# Patient Record
Sex: Female | Born: 1940 | Race: White | Hispanic: No | Marital: Married | Smoking: Never smoker
Health system: Southern US, Community
[De-identification: ages and names within clinical notes are randomized; demographics above are authoritative.]

## PROBLEM LIST (undated history)

## (undated) ENCOUNTER — Encounter

## (undated) ENCOUNTER — Telehealth: Attending: Internal Medicine | Primary: Internal Medicine

## (undated) ENCOUNTER — Ambulatory Visit

## (undated) ENCOUNTER — Telehealth

## (undated) ENCOUNTER — Encounter: Attending: Hematology & Oncology | Primary: Hematology & Oncology

## (undated) ENCOUNTER — Telehealth: Attending: Hematology & Oncology | Primary: Hematology & Oncology

## (undated) ENCOUNTER — Encounter: Payer: MEDICARE | Attending: Adult Health | Primary: Adult Health

## (undated) ENCOUNTER — Ambulatory Visit: Attending: Adult Health | Primary: Adult Health

## (undated) ENCOUNTER — Telehealth: Attending: Medical Oncology | Primary: Medical Oncology

## (undated) ENCOUNTER — Ambulatory Visit: Attending: Family | Primary: Family

## (undated) ENCOUNTER — Encounter: Attending: Pharmacist | Primary: Pharmacist

## (undated) ENCOUNTER — Inpatient Hospital Stay

## (undated) ENCOUNTER — Ambulatory Visit: Payer: MEDICARE | Attending: Hematology & Oncology | Primary: Hematology & Oncology

## (undated) ENCOUNTER — Encounter: Payer: MEDICARE | Attending: Hematology & Oncology | Primary: Hematology & Oncology

## (undated) ENCOUNTER — Telehealth: Attending: Pharmacist | Primary: Pharmacist

## (undated) ENCOUNTER — Ambulatory Visit: Payer: MEDICARE

## (undated) ENCOUNTER — Ambulatory Visit: Attending: Hematology & Oncology | Primary: Hematology & Oncology

## (undated) DIAGNOSIS — E785 Hyperlipidemia, unspecified: Secondary | ICD-10-CM

## (undated) DIAGNOSIS — J45909 Unspecified asthma, uncomplicated: Secondary | ICD-10-CM

## (undated) DIAGNOSIS — N189 Chronic kidney disease, unspecified: Secondary | ICD-10-CM

## (undated) DIAGNOSIS — K219 Gastro-esophageal reflux disease without esophagitis: Secondary | ICD-10-CM

## (undated) DIAGNOSIS — C801 Malignant (primary) neoplasm, unspecified: Secondary | ICD-10-CM

## (undated) DIAGNOSIS — H04121 Dry eye syndrome of right lacrimal gland: Secondary | ICD-10-CM

## (undated) DIAGNOSIS — Z5189 Encounter for other specified aftercare: Secondary | ICD-10-CM

## (undated) DIAGNOSIS — K649 Unspecified hemorrhoids: Secondary | ICD-10-CM

## (undated) DIAGNOSIS — M1990 Unspecified osteoarthritis, unspecified site: Secondary | ICD-10-CM

## (undated) DIAGNOSIS — I10 Essential (primary) hypertension: Secondary | ICD-10-CM

## (undated) DIAGNOSIS — H269 Unspecified cataract: Secondary | ICD-10-CM

## (undated) DIAGNOSIS — H409 Unspecified glaucoma: Secondary | ICD-10-CM

## (undated) HISTORY — DX: Dry eye syndrome of right lacrimal gland: H04.121

## (undated) HISTORY — DX: Unspecified asthma, uncomplicated: J45.909

## (undated) HISTORY — DX: Unspecified cataract: H26.9

## (undated) HISTORY — DX: Unspecified glaucoma: H40.9

## (undated) HISTORY — PX: TUBAL LIGATION: SHX77

## (undated) HISTORY — PX: TONSILLECTOMY: SUR1361

## (undated) HISTORY — DX: Malignant (primary) neoplasm, unspecified: C80.1

## (undated) HISTORY — DX: Unspecified osteoarthritis, unspecified site: M19.90

## (undated) HISTORY — DX: Chronic kidney disease, unspecified: N18.9

## (undated) HISTORY — PX: CATARACT EXTRACTION, BILATERAL: SHX1313

## (undated) HISTORY — DX: Essential (primary) hypertension: I10

## (undated) HISTORY — DX: Unspecified hemorrhoids: K64.9

## (undated) HISTORY — DX: Hyperlipidemia, unspecified: E78.5

## (undated) HISTORY — PX: CHOLECYSTECTOMY: SHX55

---

## 1989-01-06 HISTORY — PX: TOTAL ABDOMINAL HYSTERECTOMY: SHX209

## 2002-10-16 LAB — HM COLONOSCOPY: HM Colonoscopy: NORMAL

## 2004-07-19 ENCOUNTER — Emergency Department: Payer: Self-pay | Admitting: Emergency Medicine

## 2004-07-27 ENCOUNTER — Emergency Department: Payer: Self-pay | Admitting: General Practice

## 2004-07-30 ENCOUNTER — Ambulatory Visit: Payer: Self-pay | Admitting: Unknown Physician Specialty

## 2005-08-27 ENCOUNTER — Ambulatory Visit: Payer: Self-pay | Admitting: Unknown Physician Specialty

## 2007-07-02 ENCOUNTER — Ambulatory Visit: Payer: Self-pay | Admitting: Unknown Physician Specialty

## 2007-12-31 ENCOUNTER — Ambulatory Visit: Payer: Self-pay | Admitting: Family Medicine

## 2008-12-29 ENCOUNTER — Ambulatory Visit: Payer: Self-pay | Admitting: Unknown Physician Specialty

## 2009-01-12 ENCOUNTER — Emergency Department: Payer: Self-pay | Admitting: Emergency Medicine

## 2010-01-03 ENCOUNTER — Ambulatory Visit: Payer: Self-pay | Admitting: Unknown Physician Specialty

## 2010-01-30 ENCOUNTER — Ambulatory Visit: Payer: Self-pay | Admitting: Internal Medicine

## 2010-04-09 ENCOUNTER — Emergency Department: Payer: Self-pay | Admitting: Unknown Physician Specialty

## 2010-04-15 ENCOUNTER — Emergency Department: Payer: Self-pay | Admitting: Emergency Medicine

## 2010-07-20 LAB — HM DEXA SCAN

## 2011-01-10 ENCOUNTER — Ambulatory Visit: Payer: Self-pay | Admitting: Unknown Physician Specialty

## 2011-03-16 LAB — HM MAMMOGRAPHY: HM Mammogram: NORMAL

## 2011-05-05 ENCOUNTER — Telehealth: Payer: Self-pay | Admitting: Internal Medicine

## 2011-05-05 NOTE — Telephone Encounter (Signed)
Phone note was created in error, patient has an appt for labwork 05/06/11

## 2011-05-06 ENCOUNTER — Other Ambulatory Visit (INDEPENDENT_AMBULATORY_CARE_PROVIDER_SITE_OTHER): Payer: Medicare Other | Admitting: *Deleted

## 2011-05-06 DIAGNOSIS — Z79899 Other long term (current) drug therapy: Secondary | ICD-10-CM

## 2011-05-06 DIAGNOSIS — Z0000 Encounter for general adult medical examination without abnormal findings: Secondary | ICD-10-CM

## 2011-05-06 LAB — CBC WITH DIFFERENTIAL/PLATELET
Basophils Absolute: 0 10*3/uL (ref 0.0–0.1)
Basophils Relative: 0.5 % (ref 0.0–3.0)
Eosinophils Absolute: 0 10*3/uL (ref 0.0–0.7)
Eosinophils Relative: 1.1 % (ref 0.0–5.0)
HCT: 36.5 % (ref 36.0–46.0)
Hemoglobin: 12.2 g/dL (ref 12.0–15.0)
Lymphocytes Relative: 32.6 % (ref 12.0–46.0)
Lymphs Abs: 1 10*3/uL (ref 0.7–4.0)
MCHC: 33.5 g/dL (ref 30.0–36.0)
MCV: 95.4 fl (ref 78.0–100.0)
Monocytes Absolute: 0.3 10*3/uL (ref 0.1–1.0)
Monocytes Relative: 10.6 % (ref 3.0–12.0)
Neutro Abs: 1.7 10*3/uL (ref 1.4–7.7)
Neutrophils Relative %: 55.2 % (ref 43.0–77.0)
Platelets: 165 10*3/uL (ref 150.0–400.0)
RBC: 3.83 Mil/uL — ABNORMAL LOW (ref 3.87–5.11)
RDW: 14.7 % — ABNORMAL HIGH (ref 11.5–14.6)
WBC: 3 10*3/uL — ABNORMAL LOW (ref 4.5–10.5)

## 2011-05-06 LAB — FERRITIN: Ferritin: 39 ng/mL (ref 10.0–291.0)

## 2011-05-06 LAB — VITAMIN B12: Vitamin B-12: 725 pg/mL (ref 211–911)

## 2011-05-07 LAB — IRON AND TIBC
%SAT: 30 % (ref 20–55)
Iron: 98 ug/dL (ref 42–145)
TIBC: 330 ug/dL (ref 250–470)
UIBC: 232 ug/dL (ref 125–400)

## 2011-05-17 LAB — HM PAP SMEAR: HM Pap smear: NORMAL

## 2011-07-29 ENCOUNTER — Ambulatory Visit (INDEPENDENT_AMBULATORY_CARE_PROVIDER_SITE_OTHER): Payer: Medicare Other | Admitting: *Deleted

## 2011-07-29 DIAGNOSIS — Z23 Encounter for immunization: Secondary | ICD-10-CM

## 2011-08-01 ENCOUNTER — Encounter: Payer: Self-pay | Admitting: *Deleted

## 2011-09-30 ENCOUNTER — Other Ambulatory Visit: Payer: Self-pay | Admitting: Internal Medicine

## 2011-09-30 MED ORDER — LISINOPRIL-HYDROCHLOROTHIAZIDE 20-25 MG PO TABS: 1.0000 | ORAL_TABLET | Freq: Every day | ORAL | Status: DC

## 2011-10-17 ENCOUNTER — Encounter: Payer: Self-pay | Admitting: Internal Medicine

## 2011-10-17 ENCOUNTER — Ambulatory Visit (INDEPENDENT_AMBULATORY_CARE_PROVIDER_SITE_OTHER): Payer: Medicare Other | Admitting: Internal Medicine

## 2011-10-17 ENCOUNTER — Telehealth: Payer: Self-pay | Admitting: Internal Medicine

## 2011-10-17 DIAGNOSIS — E119 Type 2 diabetes mellitus without complications: Secondary | ICD-10-CM

## 2011-10-17 DIAGNOSIS — R5381 Other malaise: Secondary | ICD-10-CM

## 2011-10-17 DIAGNOSIS — Z79899 Other long term (current) drug therapy: Secondary | ICD-10-CM

## 2011-10-17 DIAGNOSIS — Z0000 Encounter for general adult medical examination without abnormal findings: Secondary | ICD-10-CM

## 2011-10-17 DIAGNOSIS — I10 Essential (primary) hypertension: Secondary | ICD-10-CM | POA: Insufficient documentation

## 2011-10-17 DIAGNOSIS — E785 Hyperlipidemia, unspecified: Secondary | ICD-10-CM

## 2011-10-17 DIAGNOSIS — R5383 Other fatigue: Secondary | ICD-10-CM

## 2011-10-17 LAB — LIPID PANEL
Cholesterol: 142 mg/dL (ref 0–200)
HDL: 51.5 mg/dL (ref >39.00)
LDL Cholesterol: 75 mg/dL (ref 0–99)
Total CHOL/HDL Ratio: 3
Triglycerides: 79 mg/dL (ref 0.0–149.0)
VLDL: 15.8 mg/dL (ref 0.0–40.0)

## 2011-10-17 LAB — CBC WITH DIFFERENTIAL/PLATELET
Basophils Absolute: 0 10*3/uL (ref 0.0–0.1)
Basophils Relative: 0.2 % (ref 0.0–3.0)
Eosinophils Absolute: 0 10*3/uL (ref 0.0–0.7)
Eosinophils Relative: 0.3 % (ref 0.0–5.0)
HCT: 35.6 % — ABNORMAL LOW (ref 36.0–46.0)
Hemoglobin: 12.2 g/dL (ref 12.0–15.0)
Lymphocytes Relative: 24.8 % (ref 12.0–46.0)
Lymphs Abs: 1.3 10*3/uL (ref 0.7–4.0)
MCHC: 34.2 g/dL (ref 30.0–36.0)
MCV: 94.8 fl (ref 78.0–100.0)
Monocytes Absolute: 0.6 10*3/uL (ref 0.1–1.0)
Monocytes Relative: 10.8 % (ref 3.0–12.0)
Neutro Abs: 3.4 10*3/uL (ref 1.4–7.7)
Neutrophils Relative %: 63.9 % (ref 43.0–77.0)
Platelets: 193 10*3/uL (ref 150.0–400.0)
RBC: 3.76 Mil/uL — ABNORMAL LOW (ref 3.87–5.11)
RDW: 14.4 % (ref 11.5–14.6)
WBC: 5.4 10*3/uL (ref 4.5–10.5)

## 2011-10-17 LAB — COMPREHENSIVE METABOLIC PANEL
ALT: 27 U/L (ref 0–35)
AST: 34 U/L (ref 0–37)
Albumin: 4.2 g/dL (ref 3.5–5.2)
Alkaline Phosphatase: 51 U/L (ref 39–117)
BUN: 13 mg/dL (ref 6–23)
CO2: 30 mEq/L (ref 19–32)
Calcium: 9.8 mg/dL (ref 8.4–10.5)
Chloride: 98 mEq/L (ref 96–112)
Creatinine, Ser: 0.6 mg/dL (ref 0.4–1.2)
GFR: 98.7 mL/min (ref >60.00)
Glucose, Bld: 140 mg/dL — ABNORMAL HIGH (ref 70–99)
Potassium: 3.6 mEq/L (ref 3.5–5.1)
Sodium: 135 mEq/L (ref 135–145)
Total Bilirubin: 0.7 mg/dL (ref 0.3–1.2)
Total Protein: 7.7 g/dL (ref 6.0–8.3)

## 2011-10-17 LAB — MICROALBUMIN / CREATININE URINE RATIO
Creatinine,U: 31.8 mg/dL
Microalb Creat Ratio: 0.9 mg/g (ref 0.0–30.0)
Microalb, Ur: 0.3 mg/dL (ref 0.0–1.9)

## 2011-10-17 LAB — HEMOGLOBIN A1C: Hgb A1c MFr Bld: 7 % — ABNORMAL HIGH (ref 4.6–6.5)

## 2011-10-17 LAB — TSH: TSH: 2.35 u[IU]/mL (ref 0.35–5.50)

## 2011-10-17 MED ORDER — ZOSTER VACCINE LIVE 19400 UNT/0.65ML ~~LOC~~ SOLR: 0.6500 mL | Freq: Once | SUBCUTANEOUS | Status: AC

## 2011-10-17 NOTE — Progress Notes (Signed)
Subjective:    Patient ID: Julie Holland, female    DOB: [birth year], 71 y.o.   MRN: [id number]  HPI  Past Medical History  Diagnosis Date  . Diabetes mellitus   . Hyperlipidemia   . Hypertension    Current Outpatient Prescriptions on File Prior to Visit  Medication Sig Dispense Refill  . lisinopril-hydrochlorothiazide (PRINZIDE,ZESTORETIC) 20-25 MG per tablet Take 1 tablet by mouth daily.  90 tablet  3    Review of Systems  Constitutional: Negative for fever, chills and unexpected weight change.  HENT: Negative for hearing loss, ear pain, nosebleeds, congestion, sore throat, facial swelling, rhinorrhea, sneezing, mouth sores, trouble swallowing, neck pain, neck stiffness, voice change, postnasal drip, sinus pressure, tinnitus and ear discharge.   Eyes: Negative for pain, discharge, redness and visual disturbance.  Respiratory: Negative for cough, chest tightness, shortness of breath, wheezing and stridor.   Cardiovascular: Negative for chest pain, palpitations and leg swelling.  Musculoskeletal: Negative for myalgias and arthralgias.  Skin: Negative for color change and rash.  Neurological: Negative for dizziness, weakness, light-headedness and headaches.  Hematological: Negative for adenopathy.       Objective:   Physical Exam  Constitutional: She is oriented to person, place, and time. She appears well-developed and well-nourished.  HENT:  Mouth/Throat: Oropharynx is clear and moist.  Eyes: EOM are normal. Pupils are equal, round, and reactive to light. No scleral icterus.  Neck: Normal range of motion. Neck supple. No JVD present. No thyromegaly present.  Cardiovascular: Normal rate, regular rhythm, normal heart sounds and intact distal pulses.   Pulmonary/Chest: Effort normal and breath sounds normal.  Abdominal: Soft. Bowel sounds are normal. She exhibits no mass. There is no tenderness.  Musculoskeletal: Normal range of motion. She exhibits no edema.  Lymphadenopathy:      She has no cervical adenopathy.  Neurological: She is alert and oriented to person, place, and time.  Skin: Skin is warm and dry.  Psychiatric: She has a normal mood and affect.      Assessment & Plan:   Subjective:    Julie Holland is a 71 y.o. female who presents for Medicare Annual/Subsequent preventive examination.  She has a history of diabetes mellitus, diet controlled hypertension and hyperlipidemia is here for her annual exam. She has a history of a minor fall which occurred about a month ago in her husband's work garage she could improve to left shin after slipping on a 6 slip or a step. Now resolved.  She has had no other falls in the last year and has no has had no episodes of dizziness or orthostasis.  She checks her sugars were 1-2 times daily and notes that her postprandials are always below 140 as are her fastings.  She is up-to-date  on all screenings.  Preventive Screening-Counseling & Management  Tobacco History  Smoking status  . Never Smoker   Smokeless tobacco  . Not on file     Problems Prior to Visit 1.   Current Problems (verified) Patient Active Problem List  Diagnoses  . Diabetes mellitus  . Hyperlipidemia  . Hypertension    Medications Prior to Visit Current Outpatient Prescriptions on File Prior to Visit  Medication Sig Dispense Refill  . lisinopril-hydrochlorothiazide (PRINZIDE,ZESTORETIC) 20-25 MG per tablet Take 1 tablet by mouth daily.  90 tablet  3    Current Medications (verified) Current Outpatient Prescriptions  Medication Sig Dispense Refill  . aspirin 81 MG tablet Take 160 mg by mouth daily.      Marland Kitchen  Fluticasone-Salmeterol (ADVAIR DISKUS) 100-50 MCG/DOSE AEPB Inhale 1 puff into the lungs daily.      Marland Kitchen lisinopril-hydrochlorothiazide (PRINZIDE,ZESTORETIC) 20-25 MG per tablet Take 1 tablet by mouth daily.  90 tablet  3  . Multiple Vitamin (MULTIVITAMIN) tablet Take 1 tablet by mouth daily.      . pravastatin (PRAVACHOL) 20 MG tablet  Take 20 mg by mouth daily.      Marland Kitchen zoster vaccine live, PF, (ZOSTAVAX) 57846 UNT/0.65ML injection Inject 19,400 Units into the skin once.  1 vial  0     Allergies (verified) Review of patient's allergies indicates no known allergies.   PAST HISTORY  Family History Family History  Problem Relation Age of Onset  . Diabetes Mother   . Heart disease Mother     Social History History  Substance Use Topics  . Smoking status: Never Smoker   . Smokeless tobacco: Not on file  . Alcohol Use: Not on file     Are there smokers in your home (other than you)? No  Risk Factors Current exercise habits: Home exercise routine includes walking 1 hrs per day.  Dietary issues discussed: lowglycemic index  Cardiac risk factors: advanced age (older than 40 for men, 47 for women), diabetes mellitus, dyslipidemia and hypertension.  Depression Screen (Note: if answer to either of the following is "Yes", a more complete depression screening is indicated)   Over the past two weeks, have you felt down, depressed or hopeless? No  Over the past two weeks, have you felt little interest or pleasure in doing things? No  Have you lost interest or pleasure in daily life? No  Do you often feel hopeless? No  Do you cry easily over simple problems? No  Activities of Daily Living In your present state of health, do you have any difficulty performing the following activities?:  Driving? No Managing money?  No Feeding yourself? No Getting from bed to chair? No Climbing a flight of stairs? No Preparing food and eating?: No Bathing or showering? No Getting dressed: No Getting to the toilet? No Using the toilet:No Moving around from place to place: No In the past year have you fallen or had a near fall?:No   Are you sexually active?  No  Do you have more than one partner?  No  Hearing Difficulties: No Do you often ask people to speak up or repeat themselves? No Do you experience ringing or noises in  your ears? No Do you have difficulty understanding soft or whispered voices? No   Do you feel that you have a problem with memory? No  Do you often misplace items? No  Do you feel safe at home?  Yes  Cognitive Testing  Alert? Yes  Normal Appearance?Yes  Oriented to person? Yes  Place? Yes   Time? Yes  Recall of three objects?  Yes  Can perform simple calculations? Yes  Displays appropriate judgment?Yes  Can read the correct time from a watch face?Yes   Advanced Directives have been discussed with the patient? Yes  List the Names of Other Physician/Practitioners you currently use: 1.    Indicate any recent Medical Services you may have received from other than [facility] providers in the past year (date may be approximate).  Immunization History  Administered Date(s) Administered  . Influenza Split 07/29/2011  . Pneumococcal Conjugate 10/16/2010  . Tdap 10/16/2010    Screening Tests Health Maintenance  Topic Date Due  . Zostavax  09/16/1999  . Pneumococcal Polysaccharide Vaccine Age 40  And Over  09/15/2004  . Influenza Vaccine  06/08/2012  . Colonoscopy  10/16/2012  . Tetanus/tdap  10/16/2020    All answers were reviewed with the patient and necessary referrals were made:  Duncan Dull, MD   10/19/2011   History reviewed: allergies, current medications, past family history, past medical history, past social history, past surgical history and problem list  Review of Systems A comprehensive review of systems was negative.    Objective:     Vision by Snellen chart: right ZOX:WRUEAVW declines measurement, left UJW:JXBJYNW declines measurement  There is no height on file to calculate BMI. BP 138/82  Pulse 92  Temp(Src) 97.9 F (36.6 C) (Oral)  Wt 136 lb (61.689 kg)  SpO2 98%  BP 138/82  Pulse 92  Temp(Src) 97.9 F (36.6 C) (Oral)  Wt 136 lb (61.689 kg)  SpO2 98%  General Appearance:    Alert, cooperative, no distress, appears stated age  Head:    Normocephalic, without  obvious abnormality, atraumatic  Eyes:    PERRL, conjunctiva/corneas clear, EOM's intact, fundi    benign, both eyes  Ears:    Normal TM's and external ear canals, both ears  Nose:   Nares normal, septum midline, mucosa normal, no drainage    or sinus tenderness  Throat:   Lips, mucosa, and tongue normal; teeth and gums normal  Neck:   Supple, symmetrical, trachea midline, no adenopathy;    thyroid:  no enlargement/tenderness/nodules; no carotid   bruit or JVD  Back:     Symmetric, no curvature, ROM normal, no CVA tenderness  Lungs:     Clear to auscultation bilaterally, respirations unlabored  Chest Wall:    No tenderness or deformity   Heart:    Regular rate and rhythm, S1 and S2 normal, no murmur, rub   or gallop  Breast Exam:    No tenderness, masses, or nipple abnormality  Abdomen:     Soft, non-tender, bowel sounds active all four quadrants,    no masses, no organomegaly        Extremities:   Extremities normal, atraumatic, no cyanosis or edema  Pulses:   2+ and symmetric all extremities  Skin:   Skin color, texture, turgor normal, no rashes or lesions  Lymph nodes:   Cervical, supraclavicular, and axillary nodes normal  Neurologic:   CNII-XII intact, normal strength, sensation and reflexes    throughout       Assessment:   No problem-specific assessment & plan notes found for this encounter.   Updated Medication List Outpatient Encounter Prescriptions as of 10/17/2011  Medication Sig Dispense Refill  . aspirin 81 MG tablet Take 160 mg by mouth daily.      . Fluticasone-Salmeterol (ADVAIR DISKUS) 100-50 MCG/DOSE AEPB Inhale 1 puff into the lungs daily.      Marland Kitchen lisinopril-hydrochlorothiazide (PRINZIDE,ZESTORETIC) 20-25 MG per tablet Take 1 tablet by mouth daily.  90 tablet  3  . Multiple Vitamin (MULTIVITAMIN) tablet Take 1 tablet by mouth daily.      . pravastatin (PRAVACHOL) 20 MG tablet Take 20 mg by mouth daily.      Marland Kitchen zoster vaccine live, PF, (ZOSTAVAX) 29562 UNT/0.65ML  injection Inject 19,400 Units into the skin once.  1 vial  0        Plan:    Patient Instructions (the written plan) was given to the patient.  Medicare Attestation I have personally reviewed: The patient's medical and social history Their use of alcohol, tobacco or illicit drugs Their current  medications and supplements The patient's functional ability including ADLs,fall risks, home safety risks, cognitive, and hearing and visual impairment Diet and physical activities Evidence for depression or mood disorders  The patient's weight, height, BMI, and visual acuity have been recorded in the chart.  I have made referrals, counseling, and provided education to the patient based on review of the above and I have provided the patient with a written personalized care plan for preventive services.     Duncan Dull, MD   10/19/2011

## 2011-10-17 NOTE — Telephone Encounter (Signed)
Diabetes remains  well controlled HgbA1c is < 7.0. Cholesterol is at goal too.,  And thyroid is normal

## 2011-10-17 NOTE — Patient Instructions (Signed)
We will call you with the results of your labs,  Please fill out the medicare wellness form before you le ve so we can bill this visit as a wellness visit.  Please sign a release from for the old practice's records

## 2011-10-19 ENCOUNTER — Encounter: Payer: Self-pay | Admitting: Internal Medicine

## 2011-10-19 NOTE — Assessment & Plan Note (Signed)
Well controlled on pravastatin

## 2011-10-19 NOTE — Assessment & Plan Note (Signed)
well controlled  

## 2011-10-19 NOTE — Assessment & Plan Note (Signed)
Her hgba1c is 7.0 an dshe has no proteinuria, on diet alone.  She is on an ACE inhibitor and LD is at goal.

## 2011-10-20 NOTE — Telephone Encounter (Signed)
Advised pt

## 2011-11-11 ENCOUNTER — Encounter: Payer: Self-pay | Admitting: Internal Medicine

## 2011-11-13 ENCOUNTER — Telehealth: Payer: Self-pay | Admitting: Internal Medicine

## 2011-11-13 MED ORDER — GLUCOSE BLOOD VI STRP: ORAL_STRIP | Status: AC

## 2011-11-13 NOTE — Telephone Encounter (Signed)
Rx has been faxed in.  Patient notified.

## 2011-11-13 NOTE — Telephone Encounter (Signed)
[phone] Pt called to check on her rx for test strips. her pharmacy [facility] was to send this rx to you yesterday afternoon

## 2012-02-20 ENCOUNTER — Other Ambulatory Visit: Payer: Self-pay | Admitting: Internal Medicine

## 2012-02-20 MED ORDER — PRAVASTATIN SODIUM 20 MG PO TABS: 20.0000 mg | ORAL_TABLET | Freq: Every day | ORAL | Status: DC

## 2012-04-29 ENCOUNTER — Encounter: Payer: Self-pay | Admitting: Internal Medicine

## 2012-05-15 LAB — HM MAMMOGRAPHY: HM Mammogram: NORMAL

## 2012-07-05 ENCOUNTER — Telehealth: Payer: Self-pay

## 2012-07-05 MED ORDER — FLUTICASONE-SALMETEROL 100-50 MCG/DOSE IN AEPB: 1.0000 | INHALATION_SPRAY | Freq: Every day | RESPIRATORY_TRACT | Status: DC

## 2012-07-05 NOTE — Telephone Encounter (Signed)
Patient requested refill  for Advair 100/50 sent to [facility] .

## 2012-07-12 NOTE — Telephone Encounter (Signed)
A 

## 2012-07-13 ENCOUNTER — Ambulatory Visit (INDEPENDENT_AMBULATORY_CARE_PROVIDER_SITE_OTHER): Payer: Medicare Other | Admitting: Internal Medicine

## 2012-07-13 ENCOUNTER — Encounter: Payer: Self-pay | Admitting: Internal Medicine

## 2012-07-13 VITALS — BP 128/72 | HR 93 | Temp 98.0°F | Resp 12 | Ht 64.5 in | Wt 138.2 lb

## 2012-07-13 DIAGNOSIS — E119 Type 2 diabetes mellitus without complications: Secondary | ICD-10-CM

## 2012-07-13 DIAGNOSIS — Z23 Encounter for immunization: Secondary | ICD-10-CM

## 2012-07-13 DIAGNOSIS — E785 Hyperlipidemia, unspecified: Secondary | ICD-10-CM

## 2012-07-13 DIAGNOSIS — I10 Essential (primary) hypertension: Secondary | ICD-10-CM

## 2012-07-13 LAB — COMPREHENSIVE METABOLIC PANEL
ALT: 27 U/L (ref 0–35)
AST: 34 U/L (ref 0–37)
Albumin: 4.1 g/dL (ref 3.5–5.2)
Alkaline Phosphatase: 47 U/L (ref 39–117)
BUN: 15 mg/dL (ref 6–23)
CO2: 30 mEq/L (ref 19–32)
Calcium: 9.6 mg/dL (ref 8.4–10.5)
Chloride: 99 mEq/L (ref 96–112)
Creatinine, Ser: 0.7 mg/dL (ref 0.4–1.2)
GFR: 95.01 mL/min (ref >60.00)
Glucose, Bld: 133 mg/dL — ABNORMAL HIGH (ref 70–99)
Potassium: 3.7 mEq/L (ref 3.5–5.1)
Sodium: 137 mEq/L (ref 135–145)
Total Bilirubin: 0.7 mg/dL (ref 0.3–1.2)
Total Protein: 7.5 g/dL (ref 6.0–8.3)

## 2012-07-13 LAB — HEMOGLOBIN A1C: Hgb A1c MFr Bld: 7.2 % — ABNORMAL HIGH (ref 4.6–6.5)

## 2012-07-13 LAB — LDL CHOLESTEROL, DIRECT: Direct LDL: 67.5 mg/dL

## 2012-07-13 LAB — HM DIABETES EYE EXAM: HM Diabetic Eye Exam: NORMAL

## 2012-07-13 MED ORDER — LISINOPRIL-HYDROCHLOROTHIAZIDE 20-25 MG PO TABS: 1.0000 | ORAL_TABLET | Freq: Every day | ORAL | Status: DC

## 2012-07-13 NOTE — Progress Notes (Signed)
Patient ID: MAYLEE BARE, female   DOB: 1941/07/22, 71 y.o.   MRN: [id number]   Patient Active Problem List  Diagnosis  . Diabetes mellitus, controlled  . Hyperlipidemia  . Hypertension    Subjective:  CC:   Chief Complaint  Patient presents with  . Follow-up    HPI:   ARORA COAKLEY a 71 y.o. female who presents  For diabetes follow up .  Her last A1c was 7.0 in Feb    The only elevation in blood sugars she has noticed have been post prandial , usually no higher than 150 to 160  she had one  isolated episode of  of 209 after eating pancakes . She has been recovering from right hip bursitis  diagnosed and treated by Richardean Canal   with exercises and physical therapy. She uses meloxicam daily for 2 months. All symptoms have resolved.  Past Medical History  Diagnosis Date  . Diabetes mellitus   . Hyperlipidemia   . Hypertension     History reviewed. No pertinent past surgical history.       The following portions of the patient's history were reviewed and updated as appropriate: Allergies, current medications, and problem list.    Review of Systems:   12 Pt  review of systems was negative except those addressed in the HPI,     History   Social History  . Marital Status: Married    Spouse Name: N/A    Number of Children: N/A  . Years of Education: N/A   Occupational History  . Not on file.   Social History Main Topics  . Smoking status: Never Smoker   . Smokeless tobacco: Not on file  . Alcohol Use: Not on file  . Drug Use: Not on file  . Sexually Active: Not on file   Other Topics Concern  . Not on file   Social History Narrative  . No narrative on file    Objective:  BP 128/72  Pulse 93  Temp 98 F (36.7 C) (Oral)  Resp 12  Ht 5' 4.5" (1.638 m)  Wt 138 lb 4 oz (62.71 kg)  BMI 23.36 kg/m2  SpO2 98%  General appearance: alert, cooperative and appears stated age Ears: normal TM's and external ear canals both ears Throat: lips, mucosa,  and tongue normal; teeth and gums normal Neck: no adenopathy, no carotid bruit, supple, symmetrical, trachea midline and thyroid not enlarged, symmetric, no tenderness/mass/nodules Back: symmetric, no curvature. ROM normal. No CVA tenderness. Lungs: clear to auscultation bilaterally Heart: regular rate and rhythm, S1, S2 normal, no murmur, click, rub or gallop Abdomen: soft, non-tender; bowel sounds normal; no masses,  no organomegaly Pulses: 2+ and symmetric Skin: Skin color, texture, turgor normal. No rashes or lesions Lymph nodes: Cervical, supraclavicular, and axillary nodes normal. Foot exam:  Nails are well trimmed,  No callouses,  Sensation intact to microfilament  Assessment and Plan:  Diabetes mellitus, controlled Her hemoglobin A1c is crept up to 7.2. She has been historically diet controlled.  Trial of metformin 500 mg twice daily.  Hyperlipidemia LDL is 67   Hypertension Well-controlled on current regimen. Renal function stable. No changes.   Updated Medication List Outpatient Encounter Prescriptions as of 07/13/2012  Medication Sig Dispense Refill  . aspirin 81 MG tablet Take 160 mg by mouth daily.      . Fluticasone-Salmeterol (ADVAIR DISKUS) 100-50 MCG/DOSE AEPB Inhale 1 puff into the lungs daily.  60 each  11  . glucose blood (  BAYER CONTOUR TEST) test strip Use as instructed  100 each  12  . lisinopril-hydrochlorothiazide (PRINZIDE,ZESTORETIC) 20-25 MG per tablet Take 1 tablet by mouth daily.  90 tablet  3  . Multiple Vitamin (MULTIVITAMIN) tablet Take 1 tablet by mouth daily.      . pravastatin (PRAVACHOL) 20 MG tablet Take 1 tablet (20 mg total) by mouth daily.  90 tablet  2  . [DISCONTINUED] lisinopril-hydrochlorothiazide (PRINZIDE,ZESTORETIC) 20-25 MG per tablet Take 1 tablet by mouth daily.  90 tablet  3

## 2012-07-15 ENCOUNTER — Telehealth: Payer: Self-pay | Admitting: Internal Medicine

## 2012-07-15 ENCOUNTER — Encounter: Payer: Self-pay | Admitting: Internal Medicine

## 2012-07-15 DIAGNOSIS — E119 Type 2 diabetes mellitus without complications: Secondary | ICD-10-CM

## 2012-07-15 MED ORDER — METFORMIN HCL 500 MG PO TABS: 500.0000 mg | ORAL_TABLET | Freq: Two times a day (BID) | ORAL | Status: DC

## 2012-07-15 NOTE — Assessment & Plan Note (Addendum)
Her hemoglobin A1c is crept up to 7.2. She has been historically diet controlled.  Trial of metformin 100 mg twice daily.

## 2012-07-15 NOTE — Assessment & Plan Note (Signed)
Well-controlled on current regimen. Renal function stable. No changes.

## 2012-07-15 NOTE — Telephone Encounter (Signed)
Her cholesterol is under excellent control. Her diabetes management has slipped a little bit. Her hemoglobin A1c went from 7.0 to 7.2. I would like to start her on metformin 500 mg daily and see her back in 3 months. If her postprandial dinner sugars remain  still elevated above 150 quite often their brevity would be either to see to go for walk after that meal or to consider adding a touch of [place] medication

## 2012-07-15 NOTE — Assessment & Plan Note (Signed)
LDL is 67

## 2012-07-25 NOTE — Telephone Encounter (Signed)
There were a few dictation errors in the previous message. To clarify , her hemoglobin A1c is  Elevated so  I would like her start 500 mg of metformin once daily in the morning with breakfast. This should help bring her postprandial blood sugars down but if they remain above 150 we would add an evening dose of metformin to take with dinner .

## 2012-07-26 NOTE — Telephone Encounter (Signed)
Patient notified via phone of lab results and medication.

## 2012-11-11 ENCOUNTER — Ambulatory Visit (INDEPENDENT_AMBULATORY_CARE_PROVIDER_SITE_OTHER): Payer: Medicare Other | Admitting: Internal Medicine

## 2012-11-11 ENCOUNTER — Encounter: Payer: Self-pay | Admitting: Internal Medicine

## 2012-11-11 VITALS — BP 126/84 | HR 99 | Temp 98.0°F | Resp 16 | Ht 65.0 in | Wt 135.2 lb

## 2012-11-11 DIAGNOSIS — R9431 Abnormal electrocardiogram [ECG] [EKG]: Secondary | ICD-10-CM

## 2012-11-11 DIAGNOSIS — E785 Hyperlipidemia, unspecified: Secondary | ICD-10-CM

## 2012-11-11 DIAGNOSIS — E119 Type 2 diabetes mellitus without complications: Secondary | ICD-10-CM

## 2012-11-11 DIAGNOSIS — R079 Chest pain, unspecified: Secondary | ICD-10-CM

## 2012-11-11 DIAGNOSIS — I4510 Unspecified right bundle-branch block: Secondary | ICD-10-CM | POA: Insufficient documentation

## 2012-11-11 LAB — COMPREHENSIVE METABOLIC PANEL
ALT: 28 U/L (ref 0–35)
AST: 39 U/L — ABNORMAL HIGH (ref 0–37)
Albumin: 4.1 g/dL (ref 3.5–5.2)
Alkaline Phosphatase: 60 U/L (ref 39–117)
BUN: 16 mg/dL (ref 6–23)
CO2: 28 mEq/L (ref 19–32)
Calcium: 9.8 mg/dL (ref 8.4–10.5)
Chloride: 96 mEq/L (ref 96–112)
Creatinine, Ser: 0.7 mg/dL (ref 0.4–1.2)
GFR: 85.73 mL/min (ref >60.00)
Glucose, Bld: 87 mg/dL (ref 70–99)
Potassium: 3.9 mEq/L (ref 3.5–5.1)
Sodium: 135 mEq/L (ref 135–145)
Total Bilirubin: 0.5 mg/dL (ref 0.3–1.2)
Total Protein: 8.2 g/dL (ref 6.0–8.3)

## 2012-11-11 LAB — MICROALBUMIN / CREATININE URINE RATIO
Creatinine,U: 62.9 mg/dL
Microalb Creat Ratio: 3.2 mg/g (ref 0.0–30.0)
Microalb, Ur: 2 mg/dL — ABNORMAL HIGH (ref 0.0–1.9)

## 2012-11-11 LAB — HEMOGLOBIN A1C: Hgb A1c MFr Bld: 7.1 % — ABNORMAL HIGH (ref 4.6–6.5)

## 2012-11-11 MED ORDER — PRAVASTATIN SODIUM 20 MG PO TABS: 20.0000 mg | ORAL_TABLET | Freq: Every day | ORAL | Status: DC

## 2012-11-11 NOTE — Progress Notes (Addendum)
Patient ID: Julie Holland, female   DOB: 03/28/1941, 72 y.o.   MRN: [id number]   Patient Active Problem List  Diagnosis  . Diabetes mellitus, controlled  . Hyperlipidemia  . Hypertension  . Right bundle branch block    Subjective:  CC:   Chief Complaint  Patient presents with  . Follow-up    3 month    HPI:   Julie Holland a 72 y.o. female who presents for 6 month f/u on diabetes mellitus and hypertension.  She feels well and has not had any low blood sugars lately.  She has brought a log of her blood sugars.  She is checking it twice daily; fasting and 2 hours post prandially.  Her post prandial sugars are consistenlty < 150 unless she has bread or pasta with dinner.  Having occasional insomnia aggravated by caffeine.   No  Daytime dosing or excessive sleepiness,  No changes in bowel habits.   She had a recent episode of sharp severe LUQ pain that radiated to back.  Occurred after eating a left over Caesar's salad from a local restaurant.  The pain  lasted a few minutes before resolving spontaneously.    She has had an episode of congestion resolved with mucinex.    Past Medical History  Diagnosis Date  . Diabetes mellitus   . Hyperlipidemia   . Hypertension     History reviewed. No pertinent past surgical history.     The following portions of the patient's history were reviewed and updated as appropriate: Allergies, current medications, and problem list.    Review of Systems:   Patient denies headache, fevers, malaise, unintentional weight loss, skin rash, eye pain, sinus congestion and sinus pain, sore throat, dysphagia,  hemoptysis , cough, dyspnea, wheezing, chest pain, palpitations, orthopnea, edema, abdominal pain, nausea, melena, diarrhea, constipation, flank pain, dysuria, hematuria, urinary  Frequency, nocturia, numbness, tingling, seizures,  Focal weakness, Loss of consciousness,  Tremor, insomnia, depression, anxiety, and suicidal ideation.      History   Social History  . Marital Status: Married    Spouse Name: N/A    Number of Children: N/A  . Years of Education: N/A   Occupational History  . Not on file.   Social History Main Topics  . Smoking status: Never Smoker   . Smokeless tobacco: Not on file  . Alcohol Use: Not on file  . Drug Use: Not on file  . Sexually Active: Not on file   Other Topics Concern  . Not on file   Social History Narrative  . No narrative on file    Objective:  BP 126/84  Pulse 99  Temp(Src) 98 F (36.7 C) (Oral)  Resp 16  Ht 5\' 5"  (1.651 m)  Wt 135 lb 4 oz (61.349 kg)  BMI 22.51 kg/m2  SpO2 97%  General appearance: alert, cooperative and appears stated age Ears: normal TM's and external ear canals both ears Throat: lips, mucosa, and tongue normal; teeth and gums normal Neck: no adenopathy, no carotid bruit, supple, symmetrical, trachea midline and thyroid not enlarged, symmetric, no tenderness/mass/nodules Back: symmetric, no curvature. ROM normal. No CVA tenderness. Lungs: clear to auscultation bilaterally Heart: regular rate and rhythm, S1, S2 normal, no murmur, click, rub or gallop Abdomen: soft, non-tender; bowel sounds normal; no masses,  no organomegaly Pulses: 2+ and symmetric Skin: Skin color, texture, turgor normal. No rashes or lesions Lymph nodes: Cervical, supraclavicular, and axillary nodes normal. Foot exam:  Nails are well trimmed,  No callouses,  Sensation intact to microfilament  Assessment and Plan:  Right bundle branch block An EKG was done today because of a recent episode of left upper quadrant pain .  There is no prior EKG for comparison, and she has a RBBB.  Will refer to cardiology for an evaluation.   Diabetes mellitus, controlled A1c is 7.1  And cholesterol is controlled.  Foot exam is normal except for thickened toenails. Up to date on eye exams.  On appropriate medications.  Follow up with Podiatry.   Hyperlipidemia Historically well  controlled on current regimen. Liver enzymes are stable , no changes today.   Updated Medication List Outpatient Encounter Prescriptions as of 11/11/2012  Medication Sig Dispense Refill  . aspirin 81 MG tablet Take 160 mg by mouth daily.      . Fluticasone-Salmeterol (ADVAIR DISKUS) 100-50 MCG/DOSE AEPB Inhale 1 puff into the lungs daily.  60 each  11  . glucose blood (BAYER CONTOUR TEST) test strip Use as instructed  100 each  12  . lisinopril-hydrochlorothiazide (PRINZIDE,ZESTORETIC) 20-25 MG per tablet Take 1 tablet by mouth daily.  90 tablet  3  . metFORMIN (GLUCOPHAGE) 500 MG tablet Take 1 tablet (500 mg total) by mouth 2 (two) times daily with a meal.  180 tablet  3  . Multiple Vitamin (MULTIVITAMIN) tablet Take 1 tablet by mouth daily.      . pravastatin (PRAVACHOL) 20 MG tablet Take 1 tablet (20 mg total) by mouth daily.  90 tablet  2  . [DISCONTINUED] pravastatin (PRAVACHOL) 20 MG tablet Take 1 tablet (20 mg total) by mouth daily.  90 tablet  2   No facility-administered encounter medications on file as of 11/11/2012.     Orders Placed This Encounter  Procedures  . Microalbumin / creatinine urine ratio  . Comprehensive metabolic panel  . Hemoglobin A1c  . Ambulatory referral to Cardiology  . EKG 12-Lead  . HM DIABETES FOOT EXAM    No Follow-up on file.

## 2012-11-11 NOTE — Patient Instructions (Addendum)
You are doing great!  No changes to medication unless your labs come back abnormal  During the cold season,  I recommend flushing your sinuses once or twice daily after exposure to other people with Simply Saline.   This will reduce the number of infections you get because it will flush the viruses and bacteria out of your nasal passages where they get trapped

## 2012-11-11 NOTE — Assessment & Plan Note (Signed)
An EKG was done today because of a recent episode of chest pain left upper quadrant pain .  There is no prior EKG for comparison, and she has a RBBB.  Will refer to cardiology for an evaluation.

## 2012-11-12 ENCOUNTER — Encounter: Payer: Self-pay | Admitting: Internal Medicine

## 2012-11-12 LAB — HM DIABETES FOOT EXAM: HM Diabetic Foot Exam: NORMAL

## 2012-11-12 NOTE — Assessment & Plan Note (Signed)
Historically well controlled on current regimen. Liver enzymes are stable , no changes today.

## 2012-11-12 NOTE — Assessment & Plan Note (Addendum)
A1c is 7.1  And cholesterol is controlled.  Foot exam is normal except for thickened toenails. Up to date on eye exams.  On appropriate medications.  Follow up with Podiatry.

## 2012-11-15 ENCOUNTER — Telehealth: Payer: Self-pay | Admitting: Emergency Medicine

## 2012-11-15 NOTE — Telephone Encounter (Signed)
Called patient and husband back.  Explained that the EKG that was done did NOT indicate anythinng serious but did suggest a possible conduction defect and I would like her to see a cardiologist to have it repeated and interpreted.  Patient'shusband upset that patient was not brought back to the office with him to go over the EKG in person.  Thought it was unprofessional and "I don't play that game." Apologized that I simply do not have the time to bring every patient back to explain an unusual result unless it was very serious. Explained to him that if patient felt as strongly as she did that I am very sorry, but I am not likely to change my policy .

## 2012-11-15 NOTE — Telephone Encounter (Signed)
I called the patient to make her aware of the apt I had made with [facility]. Her husband stated he wanted to talk to you about the EKG and what you seen. I told him I would make you aware of this and forward you a note.     -AB

## 2012-11-15 NOTE — Telephone Encounter (Signed)
Pt husband called very upset he states that they are upset that she was not notified at her appt about the referral to cardiology. From now on they would prefer to get those messages face to face instead. Pt husband was spoken to, even though not on DPR and told that per the pts result notes stated her need for this EKG. Pt had been contacted by [facility] before I could call with results.

## 2012-11-23 ENCOUNTER — Ambulatory Visit (INDEPENDENT_AMBULATORY_CARE_PROVIDER_SITE_OTHER): Payer: Medicare Other | Admitting: Cardiovascular Disease

## 2012-11-23 ENCOUNTER — Encounter: Payer: Self-pay | Admitting: Cardiovascular Disease

## 2012-11-23 VITALS — BP 140/80 | HR 108 | Ht 65.0 in | Wt 134.2 lb

## 2012-11-23 DIAGNOSIS — I10 Essential (primary) hypertension: Secondary | ICD-10-CM

## 2012-11-23 DIAGNOSIS — R9431 Abnormal electrocardiogram [ECG] [EKG]: Secondary | ICD-10-CM

## 2012-11-23 DIAGNOSIS — M25519 Pain in unspecified shoulder: Secondary | ICD-10-CM

## 2012-11-23 DIAGNOSIS — I4510 Unspecified right bundle-branch block: Secondary | ICD-10-CM

## 2012-11-23 MED ORDER — CARVEDILOL 6.25 MG PO TABS: 6.2500 mg | ORAL_TABLET | Freq: Two times a day (BID) | ORAL | Status: DC

## 2012-11-23 NOTE — Progress Notes (Signed)
     Julie Holland Date of Birth  [birth year]       [facility] Office [address], [address]  [address], [address] [place], [place]  [address]   [place], [place]  [address] [phone]     [phone]   Fax  [phone]    Fax [phone]  Problem List: 1. Diabetes Mellitus 2. Right bundle branch block 3. Hypertension 4. Asthma 5. Hyperlipidemia  History of Present Illness:  Ms. Julie Holland is a 72 yo who was found to have a RBBB.  She had some intrascapular pain 2 weeks ago.  Pain lasted a few seconds - occurred while she was in the kitchen.  Occurred after she ate a salad - which sometimes gives her gas pains.  She gets regular exercise - twice a week at the [facility] - water aerobics.  No CP with the water exercises.    Nonsmoker No family hx of CAD  Current Outpatient Prescriptions on File Prior to Visit  Medication Sig Dispense Refill  . aspirin 81 MG tablet Take 160 mg by mouth daily.      . Fluticasone-Salmeterol (ADVAIR DISKUS) 100-50 MCG/DOSE AEPB Inhale 1 puff into the lungs daily.  60 each  11  . lisinopril-hydrochlorothiazide (PRINZIDE,ZESTORETIC) 20-25 MG per tablet Take 1 tablet by mouth daily.  90 tablet  3  . metFORMIN (GLUCOPHAGE) 500 MG tablet Take 1 tablet (500 mg total) by mouth 2 (two) times daily with a meal.  180 tablet  3  . Multiple Vitamin (MULTIVITAMIN) tablet Take 1 tablet by mouth daily.      . pravastatin (PRAVACHOL) 20 MG tablet Take 1 tablet (20 mg total) by mouth daily.  90 tablet  2   No current facility-administered medications on file prior to visit.    No Known Allergies  Past Medical History  Diagnosis Date  . Hyperlipidemia   . Hypertension   . Asthma   . Diabetes mellitus     type II  . Arthritis     Past Surgical History  Procedure Laterality Date  . Hernia repair    . Total abdominal hysterectomy    . Tubal ligation      History  Smoking status  . Never Smoker   Smokeless tobacco  . Not on  file    History  Alcohol Use No    History reviewed. No pertinent family history.  Reviw of Systems:  Reviewed in the HPI.  All other systems are negative.  Physical Exam: Blood pressure 140/80, pulse 108, height 5\' 5"  (1.651 m), weight 134 lb 4 oz (60.895 kg). General: Well developed, well nourished, in no acute distress.  Head: Normocephalic, atraumatic, sclera non-icteric, mucus membranes are moist,   Neck: Supple. Carotids are 2 + without bruits. No JVD   Lungs: Clear   Heart: RR, normal S1, S2  Abdomen: Soft, non-tender, non-distended with normal bowel sounds.  Msk:  Strength and tone are normal   Extremities: No clubbing or cyanosis. No edema.  Distal pedal pulses are 2+ and equal    Neuro: CN II - XII intact.  Alert and oriented X 3.   Psych:  Normal   ECG: November 23, 2012: sinus tach at 108, RBBB.  Assessment / Plan:

## 2012-11-23 NOTE — Assessment & Plan Note (Signed)
Her blood pressure tended today is mildly elevated. She's also mildly tachycardic. History of diabetes mellitus we will start her on carvedilol 6.25 mg twice a day. She's currently on lisinopril 20 mg a day and HCTZ 25 mg a day. She seems to be stable. If she develops any symptoms of orthostasis, I would suggest that we decrease the dose of her HCTZ to 12.5 mg a day.  She has microalbuminuria and will need to stay on the ACE inhibitor.

## 2012-11-23 NOTE — Patient Instructions (Addendum)
Start coreg 6.25 mg twice daily  [facility] MYOVIEW  Your caregiver has ordered a Stress Test with nuclear imaging. The purpose of this test is to evaluate the blood supply to your heart muscle. This procedure is referred to as a "Non-Invasive Stress Test." This is because other than having an IV started in your vein, nothing is inserted or "invades" your body. Cardiac stress tests are done to find areas of poor blood flow to the heart by determining the extent of coronary artery disease (CAD). Some patients exercise on a treadmill, which naturally increases the blood flow to your heart, while others who are  unable to walk on a treadmill due to physical limitations have a pharmacologic/chemical stress agent called Lexiscan . This medicine will mimic walking on a treadmill by temporarily increasing your coronary blood flow.   Please note: these test may take anywhere between 2-4 hours to complete  PLEASE REPORT TO [facility] ENTR[facility]  THE VOLUNTEERS AT THE FIRST DESK WILL DIRECT YOU WHERE TO GO  Date of Procedure:______3/24/14_______________________________  Arrival Time for Procedure:_____7:15 am_________________________  Instructions regarding medication:   __x__ : Hold diabetes medication morning of procedure  _x :  Hold betablocker(s) night before procedure and morning of procedure (coreg/carvedilol)  _n/a___:  Hold other medications as follows:_________________________________________________________________________________________________________________________________________________________________________________________________________________________________________________________________________________________  PLEASE NOTIFY THE OFFICE AT LEAST 24 HOURS IN ADVANCE IF YOU ARE UNABLE TO KEEP YOUR APPOINTMENT.  [phone]  How to prepare for your Myoview test:  1. Do not eat or drink after midnight 2. No caffeine for 24 hours prior to test 3. Your medication may be taken  with water.  If your doctor stopped a medication because of this test, do not take that medication. 4. Ladies, please do not wear dresses.  Skirts or pants are appropriate. Please wear a short sleeve shirt. 5. No perfume, cologne or lotion. 6. Wear comfortable walking shoes. No heels!

## 2012-11-23 NOTE — Assessment & Plan Note (Signed)
Maddelyn presents with some intrascapular pain. The pains are fairly atypical but certainly could be due to angina in this patient who also has diabetes.  She has a right bundle branch block on EKG.  We'll schedule her for a lexiscan Myoview study. If the Myoview study is normal then we will not necessarily need to see her back for followup visit. We will have her followup with Dr. Darrick Huntsman.

## 2012-11-29 ENCOUNTER — Ambulatory Visit: Payer: Self-pay

## 2012-11-29 DIAGNOSIS — R079 Chest pain, unspecified: Secondary | ICD-10-CM

## 2012-11-30 ENCOUNTER — Other Ambulatory Visit: Payer: Self-pay

## 2012-11-30 DIAGNOSIS — I4510 Unspecified right bundle-branch block: Secondary | ICD-10-CM

## 2012-11-30 DIAGNOSIS — M25519 Pain in unspecified shoulder: Secondary | ICD-10-CM

## 2012-11-30 DIAGNOSIS — R9431 Abnormal electrocardiogram [ECG] [EKG]: Secondary | ICD-10-CM

## 2012-11-30 DIAGNOSIS — I10 Essential (primary) hypertension: Secondary | ICD-10-CM

## 2013-01-03 ENCOUNTER — Telehealth: Payer: Self-pay | Admitting: *Deleted

## 2013-01-03 MED ORDER — GLUCOSE BLOOD VI STRP: ORAL_STRIP | Status: DC

## 2013-01-03 NOTE — Telephone Encounter (Signed)
Test kit bayer contour strip

## 2013-01-03 NOTE — Telephone Encounter (Signed)
Could not locate these strips in [facility] please advise .

## 2013-01-03 NOTE — Telephone Encounter (Signed)
Patient notified

## 2013-01-03 NOTE — Telephone Encounter (Signed)
I found [employer] in [place] .  Sent to [facility].

## 2013-01-17 ENCOUNTER — Telehealth: Payer: Self-pay | Admitting: *Deleted

## 2013-01-17 NOTE — Telephone Encounter (Signed)
Patient states her long term pharmacy no longer carries Bayer contour products patient needs script sent for Accu- check strips and accu-check meter sent [facility]

## 2013-01-17 NOTE — Telephone Encounter (Signed)
rx written and placed on your desk.   

## 2013-01-17 NOTE — Telephone Encounter (Signed)
Patient notified scripts sent to [facility].

## 2013-01-24 ENCOUNTER — Telehealth: Payer: Self-pay | Admitting: *Deleted

## 2013-01-24 NOTE — Telephone Encounter (Signed)
Refill request  Accu-chek nano kit  Accu-chek mlticlx lancet Dev  Battery [employer] [phone]  [facility] BG meter kit

## 2013-01-25 MED ORDER — ACCU-CHEK NANO SMARTVIEW W/DEVICE KIT: 1.0000 | PACK | Freq: Every day | Status: DC

## 2013-01-25 MED ORDER — GLUCOSE BLOOD VI STRP: ORAL_STRIP | Status: DC

## 2013-01-25 MED ORDER — BLOOD GLUCOSE TEST VI STRP: ORAL_STRIP | Status: DC

## 2013-01-25 NOTE — Telephone Encounter (Signed)
Patient uses [facility] for refills and they longer carry the [employer] patient would like script for Accu- check Nano meter and strips. Please advise?

## 2013-01-25 NOTE — Telephone Encounter (Signed)
rx printed.  Please send rxs  for accu check and strips to [facility]

## 2013-01-26 NOTE — Telephone Encounter (Signed)
Patient notified scripts sent to pharmacy.

## 2013-02-03 ENCOUNTER — Telehealth: Payer: Self-pay | Admitting: Internal Medicine

## 2013-02-03 ENCOUNTER — Other Ambulatory Visit: Payer: Self-pay | Admitting: Internal Medicine

## 2013-02-03 MED ORDER — ACCU-CHEK NANO SMARTVIEW W/DEVICE KIT: 1.0000 | PACK | Freq: Two times a day (BID) | Status: DC

## 2013-02-03 NOTE — Telephone Encounter (Signed)
Patient wanting Dr. Darrick Huntsman to call the pharmacy regarding her prescription for her Accu- check Nano meter and strips. They have some questions regarding the instructions for the prescription. They will not release the machine and strips until someone calls .

## 2013-02-03 NOTE — Telephone Encounter (Signed)
I resent the glucose meter rx with instructions to use twice daily to check blood sugar   .  The strips already say that.,  I can't imagine what else they want.  Ask the patient to find out , you have tried enough

## 2013-02-03 NOTE — Telephone Encounter (Signed)
See prior note

## 2013-02-03 NOTE — Telephone Encounter (Signed)
Tried several times to reach pharmacy receive busy signal.

## 2013-02-04 NOTE — Telephone Encounter (Signed)
Script faxed and called patient to notify.

## 2013-02-10 MED ORDER — BLOOD GLUCOSE TEST VI STRP: ORAL_STRIP | Status: DC

## 2013-02-10 MED ORDER — GLUCOSE BLOOD VI STRP: ORAL_STRIP | Status: DC

## 2013-02-10 NOTE — Addendum Note (Signed)
Addended by: Dennie Bible on: 02/10/2013 03:56 PM   Modules accepted: Orders

## 2013-02-10 NOTE — Progress Notes (Signed)
Called pharmacy test strips to be sent today to patient. Patient had called and stated she had received meter but no strips.

## 2013-02-13 HISTORY — PX: COLONOSCOPY: SHX174

## 2013-03-09 LAB — HM PAP SMEAR: HM Pap smear: NORMAL

## 2013-03-14 ENCOUNTER — Encounter: Payer: Self-pay | Admitting: Internal Medicine

## 2013-03-14 ENCOUNTER — Ambulatory Visit (INDEPENDENT_AMBULATORY_CARE_PROVIDER_SITE_OTHER): Payer: Medicare Other | Admitting: Internal Medicine

## 2013-03-14 VITALS — BP 130/78 | HR 78 | Temp 98.0°F | Resp 14 | Wt 137.5 lb

## 2013-03-14 DIAGNOSIS — E785 Hyperlipidemia, unspecified: Secondary | ICD-10-CM

## 2013-03-14 DIAGNOSIS — Z0000 Encounter for general adult medical examination without abnormal findings: Secondary | ICD-10-CM

## 2013-03-14 DIAGNOSIS — E119 Type 2 diabetes mellitus without complications: Secondary | ICD-10-CM

## 2013-03-14 DIAGNOSIS — J45909 Unspecified asthma, uncomplicated: Secondary | ICD-10-CM | POA: Insufficient documentation

## 2013-03-14 DIAGNOSIS — I4510 Unspecified right bundle-branch block: Secondary | ICD-10-CM

## 2013-03-14 DIAGNOSIS — J4520 Mild intermittent asthma, uncomplicated: Secondary | ICD-10-CM

## 2013-03-14 DIAGNOSIS — I10 Essential (primary) hypertension: Secondary | ICD-10-CM

## 2013-03-14 LAB — LIPID PANEL
Cholesterol: 130 mg/dL (ref 0–200)
HDL: 49.2 mg/dL (ref >39.00)
LDL Cholesterol: 62 mg/dL (ref 0–99)
Total CHOL/HDL Ratio: 3
Triglycerides: 92 mg/dL (ref 0.0–149.0)
VLDL: 18.4 mg/dL (ref 0.0–40.0)

## 2013-03-14 LAB — HEMOGLOBIN A1C: Hgb A1c MFr Bld: 7.5 % — ABNORMAL HIGH (ref 4.6–6.5)

## 2013-03-14 LAB — HM MAMMOGRAPHY: HM Mammogram: NORMAL

## 2013-03-14 MED ORDER — ALBUTEROL SULFATE HFA 108 (90 BASE) MCG/ACT IN AERS: 2.0000 | INHALATION_SPRAY | Freq: Four times a day (QID) | RESPIRATORY_TRACT | Status: DC | PRN

## 2013-03-14 MED ORDER — ZOSTER VACCINE LIVE 19400 UNT/0.65ML ~~LOC~~ SOLR: 0.6500 mL | Freq: Once | SUBCUTANEOUS | Status: DC

## 2013-03-14 NOTE — Assessment & Plan Note (Signed)
Controlled with Advair,  No rescue inhalers use in years.

## 2013-03-14 NOTE — Progress Notes (Signed)
Patient ID: Julie Holland, female   DOB: 27-Jan-1941, 72 y.o.   MRN: [id number]    The patient is here for annual Medicare wellness examination and management of other chronic and acute problems.   The risk factors are reflected in the social history.  The roster of all physicians providing medical care to patient - is listed in the Snapshot section of the chart.  Activities of daily living:  The patient is 100% independent in all ADLs: dressing, toileting, feeding as well as independent mobility  Home safety : The patient has smoke detectors in the home. They wear seatbelts.  There are no firearms at home. There is no violence in the home.   There is no risks for hepatitis, STDs or HIV. There is no   history of blood transfusion. They have no travel history to infectious disease endemic areas of the world.  The patient has seen their dentist in the last six month. They have seen their eye doctor in the last year. They admit to slight hearing difficulty with regard to whispered voices and some television programs.  They have deferred audiologic testing in the last year.  They do not  have excessive sun exposure. Discussed the need for sun protection: hats, long sleeves and use of sunscreen if there is significant sun exposure.   Diet: the importance of a healthy diet is discussed. They do have a healthy diet.  The benefits of regular aerobic exercise were discussed. She walks 4 times per week ,  20 minutes.   Depression screen: there are no signs or vegative symptoms of depression- irritability, change in appetite, anhedonia, sadness/tearfullness.  Cognitive assessment: the patient manages all their financial and personal affairs and is actively engaged. They could relate day,date,year and events; recalled 2/3 objects at 3 minutes; performed clock-face test normally.  The following portions of the patient's history were reviewed and updated as appropriate: allergies, current medications, past  family history, past medical history,  past surgical history, past social history  and problem list.  Visual acuity was not assessed per patient preference since she has regular follow up with her ophthalmologist. Hearing and body mass index were assessed and reviewed.   During the course of the visit the patient was educated and counseled about appropriate screening and preventive services including : fall prevention , diabetes screening, nutrition counseling, colorectal cancer screening, and recommended immunizations.    Objective: BP 130/78  Pulse 78  Temp(Src) 98 F (36.7 C) (Oral)  Resp 14  Wt 137 lb 8 oz (62.37 kg)  BMI 22.88 kg/m2  SpO2 97% General appearance: alert, cooperative and appears stated age Ears: normal TM's and external ear canals both ears Throat: lips, mucosa, and tongue normal; teeth and gums normal Neck: no adenopathy, no carotid bruit, supple, symmetrical, trachea midline and thyroid not enlarged, symmetric, no tenderness/mass/nodules Back: symmetric, no curvature. ROM normal. No CVA tenderness. Lungs: clear to auscultation bilaterally Heart: regular rate and rhythm, S1, S2 normal, no murmur, click, rub or gallop Abdomen: soft, non-tender; bowel sounds normal; no masses,  no organomegaly Pulses: 2+ and symmetric Skin: Skin color, texture, turgor normal. No rashes or lesions Lymph nodes: Cervical, supraclavicular, and axillary nodes normal. Foot exam:  Nails are well trimmed,  No callouses,  Sensation intact to microfilament  Assessment and Plan:  Intrinsic asthma Controlled with Advair,  No rescue inhalers use in years.   Diabetes mellitus, controlled Her a1c has risen to 7.5 on current metformin dose  and fasting  glucoses have been above goal emnder for diabetic eye exam given.,  foot exam normal .  Will increase metformin to 850 mg bid.   Routine general medical examination at a health care facility Annual comprehensive exam was done exluding breast,  pelvic and PAP smear. All screenings have been addressed .   Hypertension Well controlled on current regimen which now includes carvedilol. Renal function stable, no changes today.  Hyperlipidemia ldl was 67 in Nov and is pending for today on current pravastatin dose. contineu asa   Right bundle branch block Incidental finding during evaluation last visit for an episode of atypical chest pain.  Normal lexiscan per Dr Manuela Neptune.    Updated Medication List Outpatient Encounter Prescriptions as of 03/14/2013  Medication Sig Dispense Refill  . aspirin 81 MG tablet Take 160 mg by mouth daily.      . Blood Glucose Monitoring Suppl (ACCU-CHEK NANO SMARTVIEW) W/DEVICE KIT 1 Device by Does not apply route 2 (two) times daily. To check blood sugar  1 kit  1  . carvedilol (COREG) 6.25 MG tablet Take 1 tablet (6.25 mg total) by mouth 2 (two) times daily.  180 tablet  3  . Fluticasone-Salmeterol (ADVAIR DISKUS) 100-50 MCG/DOSE AEPB Inhale 1 puff into the lungs daily.  60 each  11  . Glucose Blood (BLOOD GLUCOSE TEST STRIPS) STRP Use to test blood sugar one to  two times daily (90 day supply)  200 each  3  . glucose blood test strip accucheck test strips Check blood sugar bid Dx 250.00  100 each  3  . lisinopril-hydrochlorothiazide (PRINZIDE,ZESTORETIC) 20-25 MG per tablet Take 1 tablet by mouth daily.  90 tablet  3  . metFORMIN (GLUCOPHAGE) 850 MG tablet Take 1 tablet (850 mg total) by mouth 2 (two) times daily with a meal.  180 tablet  3  . Multiple Vitamin (MULTIVITAMIN) tablet Take 1 tablet by mouth daily.      . Nutritional Supplements (ESTROVEN PO) Take by mouth daily.      . pravastatin (PRAVACHOL) 20 MG tablet Take 1 tablet (20 mg total) by mouth daily.  90 tablet  2  . [DISCONTINUED] metFORMIN (GLUCOPHAGE) 500 MG tablet Take 1 tablet (500 mg total) by mouth 2 (two) times daily with a meal.  180 tablet  3  . albuterol (PROVENTIL HFA;VENTOLIN HFA) 108 (90 BASE) MCG/ACT inhaler Inhale 2 puffs  into the lungs every 6 (six) hours as needed for wheezing.  6.7 g  11  . zoster vaccine live, PF, (ZOSTAVAX) 16109 UNT/0.65ML injection Inject 19,400 Units into the skin once.  1 each  0   No facility-administered encounter medications on file as of 03/14/2013.

## 2013-03-14 NOTE — Patient Instructions (Addendum)
You had your annual Medicare wellness exam today  We will contact Dr Lemar Livings for your  colon CA screening test.   You need to have a  a Shingles vaccine.  I have given you prescriptions for this because they will be cheaper at your  local pharmacy   We will contact you with the bloodwork results

## 2013-03-14 NOTE — Assessment & Plan Note (Addendum)
Her a1c has risen to 7.5 on current metformin dose  and fasting glucoses have been above goal emnder for diabetic eye exam given.,  foot exam normal .  Will increase metformin to 850 mg bid.

## 2013-03-15 DIAGNOSIS — Z0000 Encounter for general adult medical examination without abnormal findings: Secondary | ICD-10-CM | POA: Insufficient documentation

## 2013-03-15 LAB — TSH: TSH: 2.15 u[IU]/mL (ref 0.35–5.50)

## 2013-03-15 MED ORDER — METFORMIN HCL 850 MG PO TABS: 850.0000 mg | ORAL_TABLET | Freq: Two times a day (BID) | ORAL | Status: DC

## 2013-03-15 NOTE — Assessment & Plan Note (Addendum)
ldl was 67 in Nov and is pending for today on current pravastatin dose. contineu asa

## 2013-03-15 NOTE — Assessment & Plan Note (Addendum)
Incidental finding during evaluation last visit for an episode of atypical chest pain.  Normal lexiscan per Dr Manuela Neptune.

## 2013-03-15 NOTE — Assessment & Plan Note (Signed)
Well controlled on current regimen which now includes carvedilol. Renal function stable, no changes today.

## 2013-03-15 NOTE — Assessment & Plan Note (Signed)
Annual comprehensive exam was done exluding breast, pelvic and PAP smear. All screenings have been addressed .

## 2013-04-12 ENCOUNTER — Telehealth: Payer: Self-pay | Admitting: Internal Medicine

## 2013-04-12 DIAGNOSIS — Z1211 Encounter for screening for malignant neoplasm of colon: Secondary | ICD-10-CM

## 2013-04-12 NOTE — Telephone Encounter (Signed)
Patient called requesting a referral for Dr. Lemar Livings for Screening of colon ca.

## 2013-04-13 ENCOUNTER — Encounter: Payer: Self-pay | Admitting: General Surgery

## 2013-04-13 NOTE — Telephone Encounter (Signed)
Referral is in process as requested 

## 2013-04-19 LAB — HM DIABETES EYE EXAM

## 2013-05-04 ENCOUNTER — Ambulatory Visit: Payer: Self-pay | Admitting: General Surgery

## 2013-05-05 ENCOUNTER — Telehealth: Payer: Self-pay | Admitting: *Deleted

## 2013-05-05 NOTE — Telephone Encounter (Signed)
Patient is taking Cardedill 6.25mg . Wants to know if she can drink an occasional beer.

## 2013-05-05 NOTE — Telephone Encounter (Signed)
Spoke w/ pt.  She is concerned that the label on her Carvedilol reads "alcohol may impair ability to drive or operate heavy machinery" and her husband was concerned that maybe she should eliminate her alcohol altogether.  Instructed pt that an occasional beer was fine, and she further explained that she "only has one once a month".  Informed pt that was fine and to call us if she has any other questions or concerns.

## 2013-05-16 ENCOUNTER — Encounter: Payer: Self-pay | Admitting: General Surgery

## 2013-05-16 ENCOUNTER — Ambulatory Visit (INDEPENDENT_AMBULATORY_CARE_PROVIDER_SITE_OTHER): Payer: Medicare Other | Admitting: General Surgery

## 2013-05-16 ENCOUNTER — Other Ambulatory Visit: Payer: Self-pay | Admitting: General Surgery

## 2013-05-16 VITALS — BP 132/70 | HR 84 | Resp 14 | Ht 64.0 in | Wt 135.0 lb

## 2013-05-16 DIAGNOSIS — K439 Ventral hernia without obstruction or gangrene: Secondary | ICD-10-CM

## 2013-05-16 DIAGNOSIS — Z1211 Encounter for screening for malignant neoplasm of colon: Secondary | ICD-10-CM

## 2013-05-16 MED ORDER — POLYETHYLENE GLYCOL 3350 17 GM/SCOOP PO POWD: ORAL | Status: DC

## 2013-05-16 NOTE — Patient Instructions (Addendum)
Colonoscopy A colonoscopy is an exam to evaluate your entire colon. In this exam, your colon is cleansed. A long fiberoptic tube is inserted through your rectum and into your colon. The fiberoptic scope (endoscope) is a long bundle of enclosed and very flexible fibers. These fibers transmit light to the area examined and send images from that area to your caregiver. Discomfort is usually minimal. You may be given a drug to help you sleep (sedative) during or prior to the procedure. This exam helps to detect lumps (tumors), polyps, inflammation, and areas of bleeding. Your caregiver may also take a small piece of tissue (biopsy) that will be examined under a microscope. LET YOUR CAREGIVER KNOW ABOUT:   Allergies to food or medicine.  Medicines taken, including vitamins, herbs, eyedrops, over-the-counter medicines, and creams.  Use of steroids (by mouth or creams).  Previous problems with anesthetics or numbing medicines.  History of bleeding problems or blood clots.  Previous surgery.  Other health problems, including diabetes and kidney problems.  Possibility of pregnancy, if this applies. BEFORE THE PROCEDURE   A clear liquid diet may be required for 2 days before the exam.  Ask your caregiver about changing or stopping your regular medications.  Liquid injections (enemas) or laxatives may be required.  A large amount of electrolyte solution may be given to you to drink over a short period of time. This solution is used to clean out your colon.  You should be present 60 minutes prior to your procedure or as directed by your caregiver. AFTER THE PROCEDURE   If you received a sedative or pain relieving medication, you will need to arrange for someone to drive you home.  Occasionally, there is a little blood passed with the first bowel movement. Do not be concerned. FINDING OUT THE RESULTS OF YOUR TEST Not all test results are available during your visit. If your test results are  not back during the visit, make an appointment with your caregiver to find out the results. Do not assume everything is normal if you have not heard from your caregiver or the medical facility. It is important for you to follow up on all of your test results. HOME CARE INSTRUCTIONS   It is not unusual to pass moderate amounts of gas and experience mild abdominal cramping following the procedure. This is due to air being used to inflate your colon during the exam. Walking or a warm pack on your belly (abdomen) may help.  You may resume all normal meals and activities after sedatives and medicines have worn off.  Only take over-the-counter or prescription medicines for pain, discomfort, or fever as directed by your caregiver. Do not use aspirin or blood thinners if a biopsy was taken. Consult your caregiver for medicine usage if biopsies were taken. SEEK IMMEDIATE MEDICAL CARE IF:   You have a fever.  You pass large blood clots or fill a toilet with blood following the procedure. This may also occur 10 to 14 days following the procedure. This is more likely if a biopsy was taken.  You develop abdominal pain that keeps getting worse and cannot be relieved with medicine. Document Released: 08/22/2000 Document Revised: 11/17/2011 Document Reviewed: 04/06/2008 [facility] Patient Information 2014 [place], [place].  Patient has been scheduled for a colonoscopy on 05-25-13 at [facility]. This patient has been asked to hold metformin day of colonoscopy prep and procedure. It is okay for patient to continue 81 mg aspirin.

## 2013-05-16 NOTE — Progress Notes (Signed)
Patient ID: Julie Holland, female   DOB: May 01, 1941, 72 y.o.   MRN: [id number]  Chief Complaint  Patient presents with  . Other    colonoscopy    HPI Julie Holland is a 72 y.o. female  Here for colonoscopy discussion. Her last colonoscopy was done in June of 2004. She reports no problems with the bowels but does report hemorrhoids. She does report some reflux, gas, and mild stomach burning that has occurred intermittently since her gallbladder removal. She also reports a possible abdominal hernia that she noticed about 1 year ago. She does have a family history of colon cancer. HPI  Past Medical History  Diagnosis Date  . Hyperlipidemia   . Hypertension   . Asthma   . Diabetes mellitus     type II  . Arthritis   . Hemorrhoids     Past Surgical History  Procedure Laterality Date  . Hernia repair    . Tubal ligation    . Total abdominal hysterectomy  01/1989  . Cholecystectomy    . Colonoscopy  02/14/03    Dr Lemar Livings    Family History  Problem Relation Age of Onset  . Colon cancer Father   . Lung cancer Brother   . Bladder Cancer Mother   . Lymphoma Sister     Social History History  Substance Use Topics  . Smoking status: Never Smoker   . Smokeless tobacco: Never Used  . Alcohol Use: No    No Known Allergies  Current Outpatient Prescriptions  Medication Sig Dispense Refill  . albuterol (PROVENTIL HFA;VENTOLIN HFA) 108 (90 BASE) MCG/ACT inhaler Inhale 2 puffs into the lungs every 6 (six) hours as needed for wheezing.  6.7 g  11  . aspirin 81 MG tablet Take 160 mg by mouth daily.      . carvedilol (COREG) 6.25 MG tablet Take 1 tablet (6.25 mg total) by mouth 2 (two) times daily.  180 tablet  3  . Fluticasone-Salmeterol (ADVAIR DISKUS) 100-50 MCG/DOSE AEPB Inhale 1 puff into the lungs daily.  60 each  11  . Glucosamine-Chondroitin (MOVE FREE PO) Take 1 tablet by mouth daily.      Marland Kitchen lisinopril-hydrochlorothiazide (PRINZIDE,ZESTORETIC) 20-25 MG per tablet Take 1  tablet by mouth daily.  90 tablet  3  . metFORMIN (GLUCOPHAGE) 850 MG tablet Take 1 tablet (850 mg total) by mouth 2 (two) times daily with a meal.  180 tablet  3  . Multiple Vitamin (MULTIVITAMIN) tablet Take 1 tablet by mouth daily.      . Nutritional Supplements (ESTROVEN PO) Take by mouth daily.      . pravastatin (PRAVACHOL) 20 MG tablet Take 1 tablet (20 mg total) by mouth daily.  90 tablet  2  . polyethylene glycol powder (GLYCOLAX/MIRALAX) powder 255 grams one bottle for colonoscopy prep  255 g  0   No current facility-administered medications for this visit.    Review of Systems Review of Systems  Constitutional: Negative.   Respiratory: Negative.   Cardiovascular: Negative.   Gastrointestinal: Positive for abdominal distention. Negative for nausea, vomiting, abdominal pain, diarrhea, constipation, blood in stool, anal bleeding and rectal pain.    Blood pressure 132/70, pulse 84, resp. rate 14, height 5\' 4"  (1.626 m), weight 135 lb (61.236 kg).  Physical Exam Physical Exam  Constitutional: She is oriented to person, place, and time. She appears well-developed and well-nourished.  Neck: Neck supple.  Cardiovascular: Normal rate, regular rhythm and normal heart sounds.   Pulmonary/Chest:  Effort normal and breath sounds normal.  Abdominal: Soft. She exhibits no pulsatile liver. There is no hepatosplenomegaly. There is no tenderness. A hernia (A midline hernia is noted 4 cm above the umblilicus. ) is present.  Well healed epigastric incision with no recurrence.  Lymphadenopathy:    She has no cervical adenopathy.  Neurological: She is alert and oriented to person, place, and time.    Data Reviewed None.  Assessment    Ventral hernia, modestly symptomatic.  Candidate for screening colonoscopy.    Plan    The patient will undergo a colonoscopy in the near future. After this study is completed we'll discuss reassessment/repair of the newly troublesome ventral hernia.     Patient has been scheduled for a colonoscopy on 05-25-13 at [facility]. This patient has been asked to hold metformin day of colonoscopy prep and procedure. It is okay for patient to continue 81 mg aspirin.   Earline Mayotte 05/16/2013, 9:25 PM

## 2013-05-25 ENCOUNTER — Ambulatory Visit: Payer: Self-pay | Admitting: General Surgery

## 2013-05-25 DIAGNOSIS — Z1211 Encounter for screening for malignant neoplasm of colon: Secondary | ICD-10-CM

## 2013-05-25 LAB — HM COLONOSCOPY

## 2013-05-26 ENCOUNTER — Encounter: Payer: Self-pay | Admitting: General Surgery

## 2013-05-27 ENCOUNTER — Telehealth: Payer: Self-pay | Admitting: *Deleted

## 2013-05-27 NOTE — Telephone Encounter (Signed)
Phone call from pt states she has felt a little dizziness "off balance" on Thursday evening.  She had her colonoscopy done Wednesday.  Her FSBS seems to be OK 103 both mornings.  She does admit to having a little allergies/sinus trouble and will monitor symptoms accordingly. She will call back on Monday if she is no better.

## 2013-05-28 ENCOUNTER — Emergency Department: Payer: Self-pay | Admitting: Emergency Medicine

## 2013-05-28 LAB — CBC
HCT: 33.7 % — ABNORMAL LOW (ref 35.0–47.0)
HGB: 11.4 g/dL — ABNORMAL LOW (ref 12.0–16.0)
MCH: 31.2 pg (ref 26.0–34.0)
MCHC: 33.9 g/dL (ref 32.0–36.0)
MCV: 92 fL (ref 80–100)
Platelet: 128 10*3/uL — ABNORMAL LOW (ref 150–440)
RBC: 3.65 10*6/uL — ABNORMAL LOW (ref 3.80–5.20)
RDW: 14.4 % (ref 11.5–14.5)
WBC: 4.3 10*3/uL (ref 3.6–11.0)

## 2013-05-28 LAB — URINALYSIS, COMPLETE
Bilirubin,UR: NEGATIVE
Glucose,UR: 150 mg/dL (ref 0–75)
Nitrite: NEGATIVE
Ph: 6 (ref 4.5–8.0)
Protein: 100
RBC,UR: 28 /HPF (ref 0–5)
Specific Gravity: 1.024 (ref 1.003–1.030)
Squamous Epithelial: 17
WBC UR: 6 /HPF (ref 0–5)

## 2013-05-28 LAB — COMPREHENSIVE METABOLIC PANEL
Albumin: 3.9 g/dL (ref 3.4–5.0)
Alkaline Phosphatase: 56 U/L (ref 50–136)
Anion Gap: 7 (ref 7–16)
BUN: 16 mg/dL (ref 7–18)
Bilirubin,Total: 0.6 mg/dL (ref 0.2–1.0)
Calcium, Total: 9.1 mg/dL (ref 8.5–10.1)
Chloride: 95 mmol/L — ABNORMAL LOW (ref 98–107)
Co2: 29 mmol/L (ref 21–32)
Creatinine: 0.63 mg/dL (ref 0.60–1.30)
EGFR (African American): >60
EGFR (Non-African Amer.): >60
Glucose: 137 mg/dL — ABNORMAL HIGH (ref 65–99)
Osmolality: 266 (ref 275–301)
Potassium: 3.3 mmol/L — ABNORMAL LOW (ref 3.5–5.1)
SGOT(AST): 29 U/L (ref 15–37)
SGPT (ALT): 29 U/L (ref 12–78)
Sodium: 131 mmol/L — ABNORMAL LOW (ref 136–145)
Total Protein: 7.5 g/dL (ref 6.4–8.2)

## 2013-05-28 LAB — TROPONIN I: Troponin-I: <0.02 ng/mL

## 2013-05-30 ENCOUNTER — Telehealth: Payer: Self-pay | Admitting: General Surgery

## 2013-05-30 ENCOUNTER — Telehealth: Payer: Self-pay | Admitting: *Deleted

## 2013-05-30 ENCOUNTER — Telehealth: Payer: Self-pay | Admitting: Internal Medicine

## 2013-05-30 DIAGNOSIS — E119 Type 2 diabetes mellitus without complications: Secondary | ICD-10-CM

## 2013-05-30 DIAGNOSIS — Z79899 Other long term (current) drug therapy: Secondary | ICD-10-CM

## 2013-05-30 DIAGNOSIS — D696 Thrombocytopenia, unspecified: Secondary | ICD-10-CM

## 2013-05-30 NOTE — Addendum Note (Signed)
Addended by: Sherlene Shams on: 05/30/2013 11:27 AM   Modules accepted: Orders

## 2013-05-30 NOTE — Telephone Encounter (Signed)
Trip to the emergency department on September 20. She reports that the light headedness has significantly improved, and does report that she has trouble when the weather changes with similar symptoms. Her appetite is not back to normal, but she is eating without pain, reports no dominant symptoms and is having regular limitation.  She was noted to be mildly anemic at the time of her EEG visit with a scant follow her serum sodium 131, potassium 3.3. Her platelet count was also noted to be somewhat low 128,000. Review of the previous laboratory studies in the EMR shows this is never been an issue. The hemoglobin is not far off from past areas within the last year.  We'll plan to look at her tomorrow when she presents with her husband for his upcoming surgery.

## 2013-05-30 NOTE — Telephone Encounter (Signed)
Thanks for the update.  She is due for DM follow up  So if you get labs,  Can you add an A1c and save her a stick? Otherwise I'' follow up on eveyrthing at her 3 month

## 2013-05-30 NOTE — Telephone Encounter (Signed)
patinet is due for 3 month DM follow up,  And recent ER visit noted (Dr Lemar Livings told me ) .  labs ordered.

## 2013-05-30 NOTE — Telephone Encounter (Signed)
Patient called the answering service and left a message on Saturday, 05-28-13. She stated she had a colonoscopy on Wednesday, 05-25-13 and was complaining of being dizzy and nauseated. It looks like she was referred to the Emergency Room per answering service fax message.   I have left patient a message on cell number to call the office so we can follow up with her.

## 2013-05-30 NOTE — Telephone Encounter (Signed)
Patient called back to say that she did go to the ER on Saturday. They did blood work and an EKG. Patient states she had low sodium and a possible sinus infection. They gave her an antibiotic through her IV and also a prescription for nausea medicine (which she has not filled yet). Patient states she is doing better today. She is not as dizzy and is eating/drinking now. She will call the office if we can be of further assistance.

## 2013-05-31 NOTE — Telephone Encounter (Signed)
Patient stated she became sick after colonoscopy went to ER for nausea and vomiting stated labs showed sodium level low. Patient has appointment schedule 10/13 offered earlier but patient stated she is fine and her husband just had surgery and cannot come in any earlier. FYI

## 2013-06-14 ENCOUNTER — Encounter: Payer: Self-pay | Admitting: General Surgery

## 2013-06-20 ENCOUNTER — Ambulatory Visit: Payer: Medicare Other | Admitting: Internal Medicine

## 2013-07-04 ENCOUNTER — Ambulatory Visit: Payer: Medicare Other | Admitting: Internal Medicine

## 2013-07-20 ENCOUNTER — Ambulatory Visit (INDEPENDENT_AMBULATORY_CARE_PROVIDER_SITE_OTHER): Payer: Medicare Other | Admitting: Internal Medicine

## 2013-07-20 ENCOUNTER — Encounter: Payer: Self-pay | Admitting: Internal Medicine

## 2013-07-20 VITALS — BP 122/74 | HR 77 | Temp 98.1°F | Resp 12 | Ht 64.0 in | Wt 128.5 lb

## 2013-07-20 DIAGNOSIS — Z1211 Encounter for screening for malignant neoplasm of colon: Secondary | ICD-10-CM

## 2013-07-20 DIAGNOSIS — H9192 Unspecified hearing loss, left ear: Secondary | ICD-10-CM

## 2013-07-20 DIAGNOSIS — Z23 Encounter for immunization: Secondary | ICD-10-CM

## 2013-07-20 DIAGNOSIS — I10 Essential (primary) hypertension: Secondary | ICD-10-CM

## 2013-07-20 DIAGNOSIS — E119 Type 2 diabetes mellitus without complications: Secondary | ICD-10-CM

## 2013-07-20 DIAGNOSIS — D62 Acute posthemorrhagic anemia: Secondary | ICD-10-CM

## 2013-07-20 DIAGNOSIS — H9190 Unspecified hearing loss, unspecified ear: Secondary | ICD-10-CM

## 2013-07-20 DIAGNOSIS — E538 Deficiency of other specified B group vitamins: Secondary | ICD-10-CM

## 2013-07-20 DIAGNOSIS — D696 Thrombocytopenia, unspecified: Secondary | ICD-10-CM

## 2013-07-20 DIAGNOSIS — E785 Hyperlipidemia, unspecified: Secondary | ICD-10-CM

## 2013-07-20 LAB — HM DIABETES FOOT EXAM: HM Diabetic Foot Exam: NORMAL

## 2013-07-20 MED ORDER — SIMVASTATIN 20 MG PO TABS: 20.0000 mg | ORAL_TABLET | Freq: Every day | ORAL | Status: DC

## 2013-07-20 MED ORDER — FLUTICASONE-SALMETEROL 100-50 MCG/DOSE IN AEPB: 1.0000 | INHALATION_SPRAY | Freq: Every day | RESPIRATORY_TRACT | Status: DC

## 2013-07-20 MED ORDER — LISINOPRIL-HYDROCHLOROTHIAZIDE 20-25 MG PO TABS: 1.0000 | ORAL_TABLET | Freq: Every day | ORAL | Status: DC

## 2013-07-20 NOTE — Progress Notes (Signed)
Pre-visit discussion using our clinic review tool. No additional management support is needed unless otherwise documented below in the visit note.  

## 2013-07-20 NOTE — Progress Notes (Signed)
Patient ID: Julie Holland, female   DOB: 1940/11/04, 72 y.o.   MRN: [id number]   Patient Active Problem List   Diagnosis Date Noted  . 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 Encounter for screening colonoscopy 05/16/2013  . Routine general medical examination at a health care facility 03/15/2013  . 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 Shoulder pain 11/23/2012  . Right bundle branch block 11/11/2012  . Diabetes mellitus, controlled   . Hyperlipidemia   . Hypertension     Subjective:  CC:   Chief Complaint  Patient presents with  . Follow-up    3 month    HPI:   Julie Holland a 72 y.o. female who presents Follow up on diabetes mellitus, hypertension and other chronic issues.  .  Had a prolonged episode of nausea and vomiting after her colonoscopy . Was taken to ER on the Sept 20th and treated for low potassium.  low sodium with iv fluids , and sent home with pills for nausea, which she did not need.   The developed sudden hearing loss in left ear which was evaluated by Erline Hau and attributed to her low potassium . After  2 rounds of prednisone her hearing loss is improving from a nadir of 17% pre treatment ,  Last time 60%   Appetite getting back to normal . Still down 7 lbs from wt at time of colonoscopy.   Her blood sugars have been brought in for review as requested from August to present. Fastigns were 125 except during the prednisone treatments.  Post prandials have been labile, some attributable to the prednisone 10/16 to 10/27 , some to dietary indiscretions.  No lows noted. Tolerating metformin without diarrhea.    Past Medical History  Diagnosis Date  . Hyperlipidemia   . Hypertension   . Asthma   . Diabetes mellitus     type II  . Arthritis   . Hemorrhoids     Past Surgical History  Procedure Laterality Date  . Hernia repair    . Tubal ligation    . Total abdominal hysterectomy  01/1989  . Cholecystectomy    . Colonoscopy  02/14/03    Dr Lemar Livings        The following portions of the patient's history were reviewed and updated as appropriate: Allergies, current medications, and problem list.    Review of Systems:   12 Pt  review of systems was negative except those addressed in the HPI,     History   Social History  . Marital Status: Married    Spouse Name: N/A    Number of Children: N/A  . Years of Education: N/A   Occupational History  . Not on file.   Social History Main Topics  . Smoking status: Never Smoker   . Smokeless tobacco: Never Used  . Alcohol Use: No  . Drug Use: No  . Sexual Activity: Not on file   Other Topics Concern  . Not on file   Social History Narrative  . No narrative on file    Objective:  Filed Vitals:   07/20/13 0958  BP: 122/74  Pulse: 77  Temp: 98.1 F (36.7 C)  Resp: 12     General appearance: alert, cooperative and appears stated age Ears: normal TM's and external ear canals both ears Throat: lips, mucosa, and tongue normal; teeth and gums normal Neck: no adenopathy, no carotid bruit, supple, symmetrical, trachea midline and thyroid not enlarged, symmetric, no tenderness/mass/nodules Back:  symmetric, no curvature. ROM normal. No CVA tenderness. Lungs: clear to auscultation bilaterally Heart: regular rate and rhythm, S1, S2 normal, no murmur, click, rub or gallop Abdomen: soft, non-tender; bowel sounds normal; no masses,  no organomegaly Pulses: 2+ and symmetric Skin: Skin color, texture, turgor normal. No rashes or lesions Lymph nodes: Cervical, supraclavicular, and axillary nodes normal. Foot exam:  Nails are well trimmed,  No callouses,  Sensation intact to microfilament  Assessment and Plan:  Diabetes mellitus, controlled hemoglobin A1c is elevated at 7.7 She is up-to-date on eye exams and her foot exam is normal.  She has no proteinuria by today's urine test. Recent elevation due to prolonged use of prednionse,  No change to medications today., but will ask  her to go for walk after lunch/dinner and repeat a1c in 3 months   Hyperlipidemia Statin changed to simvastatin today according to [facility] guidelines . LFTs are normal  Lab Results  Component Value Date   CHOL 130 03/14/2013   HDL 49.20 03/14/2013   LDLCALC 62 03/14/2013   LDLDIRECT 67.5 07/13/2012   TRIG 92.0 03/14/2013   CHOLHDL 3 03/14/2013    Hypertension Well controlled on current regimen. Renal function stable, no changes today.  Lab Results  Component Value Date   CREATININE 0.71 07/20/2013    Hearing loss in left ear Etiology unclear,  Improving ,  Managed with prednisone by Erline Hau.   Encounter for screening colonoscopy Severe diverticulosis noted.  procedure was uneventful but post procedure patient had prlonged N/V leading to ER evaluation and treatment for dehydration with hypokalemia and hyponatremia.    Updated Medication List Outpatient Encounter Prescriptions as of 07/20/2013  Medication Sig  . ACCU-CHEK SMARTVIEW test strip 1 each by Other route 2 (two) times daily.  Marland Kitchen albuterol (PROVENTIL HFA;VENTOLIN HFA) 108 (90 BASE) MCG/ACT inhaler Inhale 2 puffs into the lungs every 6 (six) hours as needed for wheezing.  Marland Kitchen aspirin 81 MG tablet Take 160 mg by mouth daily.  . carvedilol (COREG) 6.25 MG tablet Take 1 tablet (6.25 mg total) by mouth 2 (two) times daily.  . Fluticasone-Salmeterol (ADVAIR DISKUS) 100-50 MCG/DOSE AEPB Inhale 1 puff into the lungs daily.  . metFORMIN (GLUCOPHAGE) 850 MG tablet Take 1 tablet (850 mg total) by mouth 2 (two) times daily with a meal.  . Multiple Vitamin (MULTIVITAMIN) tablet Take 1 tablet by mouth daily.  . Nutritional Supplements (ESTROVEN PO) Take by mouth daily.  . [DISCONTINUED] Fluticasone-Salmeterol (ADVAIR DISKUS) 100-50 MCG/DOSE AEPB Inhale 1 puff into the lungs daily.  . [DISCONTINUED] pravastatin (PRAVACHOL) 20 MG tablet Take 1 tablet (20 mg total) by mouth daily.  Marland Kitchen lisinopril-hydrochlorothiazide (PRINZIDE,ZESTORETIC) 20-25 MG  per tablet Take 1 tablet by mouth daily.  . simvastatin (ZOCOR) 20 MG tablet Take 1 tablet (20 mg total) by mouth at bedtime.  . [DISCONTINUED] Glucosamine-Chondroitin (MOVE FREE PO) Take 1 tablet by mouth daily.  . [DISCONTINUED] lisinopril-hydrochlorothiazide (PRINZIDE,ZESTORETIC) 20-25 MG per tablet Take 1 tablet by mouth daily.  . [DISCONTINUED] polyethylene glycol powder (GLYCOLAX/MIRALAX) powder 255 grams one bottle for colonoscopy prep     Orders Placed This Encounter  Procedures  . HM DEXA SCAN  . CBC with Differential  . Vitamin B12  . Folate RBC  . Ferritin  . Iron and TIBC  . HM DIABETES EYE EXAM  . HM DIABETES FOOT EXAM    Return in about 3 months (around 10/20/2013).

## 2013-07-20 NOTE — Patient Instructions (Signed)
Your high blood pressure is well controlled  We are repeating your A1c today.  If your A1c is < 7.0 and you continue to  have recurrent low blood sugars before dinner, we may decrease your metformin to 500 mg twice daily .  I have changed your pravastatin to simvastatin per [facility] guidelines for patients with diabetes (it is a higher potency statin )

## 2013-07-21 DIAGNOSIS — H9192 Unspecified hearing loss, left ear: Secondary | ICD-10-CM | POA: Insufficient documentation

## 2013-07-21 LAB — CBC WITH DIFFERENTIAL/PLATELET
Basophils Absolute: 0 10*3/uL (ref 0.0–0.2)
Basos: 0 %
Eos: 0 %
Eosinophils Absolute: 0 10*3/uL (ref 0.0–0.4)
HCT: 33.4 % — ABNORMAL LOW (ref 34.0–46.6)
Hemoglobin: 11 g/dL — ABNORMAL LOW (ref 11.1–15.9)
Immature Grans (Abs): 0 10*3/uL (ref 0.0–0.1)
Immature Granulocytes: 0 %
Lymphocytes Absolute: 0.9 10*3/uL (ref 0.7–3.1)
Lymphs: 35 %
MCH: 31.1 pg (ref 26.6–33.0)
MCHC: 32.9 g/dL (ref 31.5–35.7)
MCV: 94 fL (ref 79–97)
Monocytes Absolute: 0.5 10*3/uL (ref 0.1–0.9)
Monocytes: 19 %
Neutrophils Absolute: 1.2 10*3/uL — ABNORMAL LOW (ref 1.4–7.0)
Neutrophils Relative %: 46 %
RBC: 3.54 x10E6/uL — ABNORMAL LOW (ref 3.77–5.28)
RDW: 14.2 % (ref 12.3–15.4)
WBC: 2.6 10*3/uL — ABNORMAL LOW (ref 3.4–10.8)

## 2013-07-21 LAB — COMPREHENSIVE METABOLIC PANEL
ALT: 16 IU/L (ref 0–32)
AST: 20 IU/L (ref 0–40)
Albumin/Globulin Ratio: 1.6 (ref 1.1–2.5)
Albumin: 4.1 g/dL (ref 3.5–4.8)
Alkaline Phosphatase: 57 IU/L (ref 39–117)
BUN/Creatinine Ratio: 17 (ref 11–26)
BUN: 12 mg/dL (ref 8–27)
CO2: 30 mmol/L — ABNORMAL HIGH (ref 18–29)
Calcium: 9.8 mg/dL (ref 8.6–10.2)
Chloride: 91 mmol/L — ABNORMAL LOW (ref 97–108)
Creatinine, Ser: 0.71 mg/dL (ref 0.57–1.00)
GFR calc Af Amer: 98 mL/min/{1.73_m2} (ref >59)
GFR calc non Af Amer: 85 mL/min/{1.73_m2} (ref >59)
Globulin, Total: 2.6 g/dL (ref 1.5–4.5)
Glucose: 99 mg/dL (ref 65–99)
Potassium: 4 mmol/L (ref 3.5–5.2)
Sodium: 135 mmol/L (ref 134–144)
Total Bilirubin: 0.4 mg/dL (ref 0.0–1.2)
Total Protein: 6.7 g/dL (ref 6.0–8.5)

## 2013-07-21 LAB — HEMOGLOBIN A1C
Est. average glucose Bld gHb Est-mCnc: 174 mg/dL
Hgb A1c MFr Bld: 7.7 % — ABNORMAL HIGH (ref 4.8–5.6)

## 2013-07-21 LAB — MICROALBUMIN / CREATININE URINE RATIO
Creatinine, Ur: 76.3 mg/dL (ref 15.0–278.0)
MICROALB/CREAT RATIO: 11.4 mg/g creat (ref 0.0–30.0)
Microalbumin, Urine: 8.7 ug/mL (ref 0.0–17.0)

## 2013-07-21 NOTE — Assessment & Plan Note (Signed)
Severe diverticulosis noted.  procedure was uneventful but post procedure patient had prlonged N/V leading to ER evaluation and treatment for dehydration with hypokalemia and hyponatremia.

## 2013-07-21 NOTE — Assessment & Plan Note (Signed)
Well controlled on current regimen. Renal function stable, no changes today.  Lab Results  Component Value Date   CREATININE 0.71 07/20/2013

## 2013-07-21 NOTE — Assessment & Plan Note (Signed)
hemoglobin A1c is elevated at 7.7 She is up-to-date on eye exams and her foot exam is normal.  She has no proteinuria by today's urine test. Recent elevation due to prolonged use of prednionse,  No change to medications today., but will ask her to go for walk after lunch/dinner and repeat a1c in 3 months

## 2013-07-21 NOTE — Assessment & Plan Note (Addendum)
Statin changed to simvastatin today according to [facility] guidelines . LFTs are normal  Lab Results  Component Value Date   CHOL 130 03/14/2013   HDL 49.20 03/14/2013   LDLCALC 62 03/14/2013   LDLDIRECT 67.5 07/13/2012   TRIG 92.0 03/14/2013   CHOLHDL 3 03/14/2013

## 2013-07-21 NOTE — Assessment & Plan Note (Signed)
Etiology unclear,  Improving ,  Managed with prednisone by Erline Hau.

## 2013-07-22 ENCOUNTER — Telehealth: Payer: Self-pay | Admitting: Internal Medicine

## 2013-07-22 NOTE — Telephone Encounter (Signed)
In formation requested has been removed.

## 2013-07-22 NOTE — Telephone Encounter (Signed)
Pt called to state that on her after visit summary at recent visit it states John B. Walker as one of her doctors.  Pt states she has nothing to do with that doctor and would like that information removed from her chart.  States Dr. Lemar Livings is correct as listed.

## 2013-07-25 ENCOUNTER — Telehealth: Payer: Self-pay | Admitting: Internal Medicine

## 2013-08-01 ENCOUNTER — Other Ambulatory Visit (INDEPENDENT_AMBULATORY_CARE_PROVIDER_SITE_OTHER): Payer: Medicare Other

## 2013-08-01 DIAGNOSIS — D62 Acute posthemorrhagic anemia: Secondary | ICD-10-CM

## 2013-08-01 LAB — CBC WITH DIFFERENTIAL/PLATELET
Basophils Absolute: 0 10*3/uL (ref 0.0–0.1)
Basophils Relative: 0 % (ref 0.0–3.0)
Eosinophils Absolute: 0 10*3/uL (ref 0.0–0.7)
Eosinophils Relative: 0.5 % (ref 0.0–5.0)
HCT: 36.1 % (ref 36.0–46.0)
Hemoglobin: 10.6 g/dL — ABNORMAL LOW (ref 12.0–15.0)
Lymphocytes Relative: 22.4 % (ref 12.0–46.0)
Lymphs Abs: 0.9 10*3/uL (ref 0.7–4.0)
MCHC: 29.3 g/dL — ABNORMAL LOW (ref 30.0–36.0)
MCV: 106.7 fl — ABNORMAL HIGH (ref 78.0–100.0)
Monocytes Absolute: 0.7 10*3/uL (ref 0.1–1.0)
Monocytes Relative: 18.1 % — ABNORMAL HIGH (ref 3.0–12.0)
Neutro Abs: 2.4 10*3/uL (ref 1.4–7.7)
Neutrophils Relative %: 59 % (ref 43.0–77.0)
Platelets: 213 10*3/uL (ref 150.0–400.0)
RBC: 3.39 Mil/uL — ABNORMAL LOW (ref 3.87–5.11)
RDW: 15.5 % — ABNORMAL HIGH (ref 11.5–14.6)
WBC: 4 10*3/uL — ABNORMAL LOW (ref 4.5–10.5)

## 2013-08-12 ENCOUNTER — Telehealth: Payer: Self-pay | Admitting: *Deleted

## 2013-08-12 ENCOUNTER — Telehealth: Payer: Self-pay | Admitting: Internal Medicine

## 2013-08-12 NOTE — Telephone Encounter (Signed)
Called pt about returning to get her blood would done, she stated she would come back sometime after christmas

## 2013-08-12 NOTE — Telephone Encounter (Signed)
Please advise I saw note to carolyn but I do not see where this was addressed as to patient. Checked with Eber Jones she is calling patient. FYI

## 2013-08-12 NOTE — Telephone Encounter (Signed)
Pt left vm.  States she had blood work 11/24 and has not heard results.

## 2013-08-16 ENCOUNTER — Other Ambulatory Visit (INDEPENDENT_AMBULATORY_CARE_PROVIDER_SITE_OTHER): Payer: Medicare Other

## 2013-08-16 DIAGNOSIS — E538 Deficiency of other specified B group vitamins: Secondary | ICD-10-CM

## 2013-08-16 DIAGNOSIS — D62 Acute posthemorrhagic anemia: Secondary | ICD-10-CM

## 2013-08-16 LAB — IRON AND TIBC
%SAT: 29 % (ref 20–55)
Iron: 97 ug/dL (ref 42–145)
TIBC: 338 ug/dL (ref 250–470)
UIBC: 241 ug/dL (ref 125–400)

## 2013-08-16 LAB — FERRITIN: Ferritin: 55.1 ng/mL (ref 10.0–291.0)

## 2013-08-16 LAB — VITAMIN B12: Vitamin B-12: 627 pg/mL (ref 211–911)

## 2013-08-17 LAB — FOLATE RBC: RBC Folate: 616 ng/mL (ref >280)

## 2013-12-05 ENCOUNTER — Other Ambulatory Visit: Payer: Self-pay | Admitting: *Deleted

## 2013-12-05 MED ORDER — CARVEDILOL 6.25 MG PO TABS: 6.2500 mg | ORAL_TABLET | Freq: Two times a day (BID) | ORAL | Status: DC

## 2013-12-05 NOTE — Telephone Encounter (Signed)
Requested Prescriptions   Signed Prescriptions Disp Refills  . carvedilol (COREG) 6.25 MG tablet 30 tablet 0    Sig: Take 1 tablet (6.25 mg total) by mouth 2 (two) times daily.    Authorizing Provider: Thayer Headings    Ordering User: Britt Bottom

## 2013-12-08 ENCOUNTER — Other Ambulatory Visit: Payer: Self-pay | Admitting: *Deleted

## 2013-12-08 NOTE — Telephone Encounter (Signed)
Error

## 2013-12-19 ENCOUNTER — Encounter: Payer: Self-pay | Admitting: Cardiovascular Disease

## 2013-12-19 ENCOUNTER — Ambulatory Visit (INDEPENDENT_AMBULATORY_CARE_PROVIDER_SITE_OTHER): Payer: Medicare Other | Admitting: Cardiovascular Disease

## 2013-12-19 VITALS — BP 134/84 | HR 76 | Ht 65.0 in | Wt 127.2 lb

## 2013-12-19 DIAGNOSIS — I10 Essential (primary) hypertension: Secondary | ICD-10-CM

## 2013-12-19 DIAGNOSIS — I4510 Unspecified right bundle-branch block: Secondary | ICD-10-CM

## 2013-12-19 MED ORDER — CARVEDILOL 6.25 MG PO TABS: 6.2500 mg | ORAL_TABLET | Freq: Two times a day (BID) | ORAL | Status: DC

## 2013-12-19 NOTE — Progress Notes (Signed)
Julie Holland Date of Birth  [birth year]       [facility] Office [address] [place], [place], [place]  [address]   Julie [place], [place]  [address] [phone]     [phone]   Fax  [phone]    Fax [phone]  Problem List: 1. Diabetes Mellitus 2. Right bundle branch block 3. Hypertension 4. Asthma 5. Hyperlipidemia  History of Present Illness:  [name] is a 73 yo who was found to have a RBBB.  She had some intrascapular pain 2 weeks ago.  Pain lasted a few seconds - occurred while she was in the kitchen.  Occurred after she ate a salad - which sometimes gives her gas pains.  She gets regular exercise - twice a week at the [place].  No CP with the water exercises.    Nonsmoker No family hx of CAD  December 19, 2013:   Julie Holland is doing well.  No CP,  She has been exercising in the pool.  Lots of stress recently,  Her sister died last week.    Current Outpatient Prescriptions on File Prior to Visit  Medication Sig Dispense Refill  . ACCU-CHEK SMARTVIEW test strip 1 each by Other route 2 (two) times daily.      Marland Kitchen albuterol (PROVENTIL HFA;VENTOLIN HFA) 108 (90 BASE) MCG/ACT inhaler Inhale 2 puffs into the lungs every 6 (six) hours as needed for wheezing.  6.7 g  11  . aspirin 81 MG tablet Take 81 mg by mouth as needed.       . carvedilol (COREG) 6.25 MG tablet Take 1 tablet (6.25 mg total) by mouth 2 (two) times daily.  30 tablet  0  . Fluticasone-Salmeterol (ADVAIR DISKUS) 100-50 MCG/DOSE AEPB Inhale 1 puff into the lungs daily.  60 each  11  . lisinopril-hydrochlorothiazide (PRINZIDE,ZESTORETIC) 20-25 MG per tablet Take 1 tablet by mouth daily.  90 tablet  3  . metFORMIN (GLUCOPHAGE) 850 MG tablet Take 1 tablet (850 mg total) by mouth 2 (two) times daily with a meal.  180 tablet  3  . Multiple Vitamin (MULTIVITAMIN) tablet Take 1 tablet by mouth daily.      . Nutritional Supplements (ESTROVEN PO)  Take by mouth daily.      . simvastatin (ZOCOR) 20 MG tablet Take 1 tablet (20 mg total) by mouth at bedtime.  90 tablet  3   No current facility-administered medications on file prior to visit.    No Known Allergies  Past Medical History  Diagnosis Date  . Hyperlipidemia   . Hypertension   . Asthma   . Diabetes mellitus     type II  . Arthritis   . Hemorrhoids     Past Surgical History  Procedure Laterality Date  . Hernia repair    . Tubal ligation    . Total abdominal hysterectomy  01/1989  . Cholecystectomy    . Colonoscopy  02/14/03    Dr Bary Castilla  . Cataract extraction, bilateral      History  Smoking status  . Never Smoker   Smokeless tobacco  . Never Used    History  Alcohol Use No    Family History  Problem Relation Age of Onset  . Colon cancer Father   . Lung cancer Brother   . Bladder Cancer Mother   . Lymphoma Sister     Reviw of Systems:  Reviewed in  the HPI.  All other systems are negative.  Physical Exam: Blood pressure 134/84, pulse 76, height 5\' 5"  (1.651 m), weight 127 lb 4 oz (57.72 kg). General: Well developed, well nourished, in no acute distress. Head: Normocephalic, atraumatic, sclera non-icteric, mucus membranes are moist,  Neck: Supple. Carotids are 2 + without bruits. No JVD  Lungs: Clear  Heart: RR, normal S1, S2 Abdomen: Soft, non-tender, non-distended with normal bowel sounds. Msk:  Strength and tone are normal  Extremities: No clubbing or cyanosis. No edema.  Distal pedal pulses are 2+ and equal  Neuro: CN II - XII intact.  Alert and oriented X 3.  Psych:  Normal   ECG: December 19, 2013:  NSR at 26.  RBBB.   Assessment / Plan:

## 2013-12-19 NOTE — Patient Instructions (Signed)
Your physician recommends that you schedule a follow-up appointment in:  Follow up as needed   Your Coreg has been refilled

## 2013-12-19 NOTE — Assessment & Plan Note (Signed)
Julie Holland is doing well.  Her BP and HR are well controlled.  Continue with current meds .  We will refill her Coreg for another year and will then have Dr. Derrel Nip refill it from then on.    I will see her on an as needed basis.   I would be happy to see her again if needed.

## 2014-01-11 ENCOUNTER — Encounter: Payer: Medicare Other | Admitting: Internal Medicine

## 2014-01-20 ENCOUNTER — Other Ambulatory Visit: Payer: Self-pay

## 2014-01-20 MED ORDER — CARVEDILOL 6.25 MG PO TABS: 6.2500 mg | ORAL_TABLET | Freq: Two times a day (BID) | ORAL | Status: DC

## 2014-02-14 ENCOUNTER — Telehealth: Payer: Self-pay | Admitting: Internal Medicine

## 2014-02-14 NOTE — Telephone Encounter (Signed)
I called Julie Holland back after I verified that we didn't have a release of information on file for the patient. I told him that I would contact Dr. Trena Platt office at [phone] to see if they would please fax the request over again. He started yelling saying if Dr. Trena Platt office didn't have the medical records by Thursday he was calling his lawyer. I told the patient that I understood and he said do you understand that I will call my lawyer I said yes sir I do understand your threat! He hung up the phone on me. I am waiting for the medical records request to come over so I can send to [facility] so they can send patients records.

## 2014-02-14 NOTE — Telephone Encounter (Signed)
Hi,  I just received a phone call from [name] who states the new physicians office still has not received the medical records and no one has contacted the patient or him at this time.  Please contact the patient or spouse today.  Thanks for your assistance.    Jose Persia, BSN, RN [employer] of Patient Experience Coordinator Direct Dial: [phone]    FAX:  [email or web address]    From: Jose Persia  Sent: Friday, February 10, 2014 9:22 AM To: Eliezer Mccoy Cc: Ali Lowe Subject: Confidential Patient Complaint for Review and Response -  Hello,  The Office of Patient Experience received the patient complaint below on February 10, 2014.  Please review and contact patient within 72 hours, by February 14, 2014  Patient Name: Julie Holland, Julie Holland  MR#:  MRN: [id number]  DOB:  [birth year] DOS: waiting past two weeks Phone number:  [phone]  Complaint:  [facility] called and states they have been waiting for 2 weeks for the records to be sent from Dr. Demetrios Isaacs office to Dr. Manuella Ghazi in [facility].  Mr. Talsma states they dont like Dr. Derrel Nip and have changed doctors.  States as of 5pm yesterday the records have not been sent.  States patient has filled out all of the necessary paperwork to have the records sent.  States does not remember Dr. Janyth Pupa first name but it should be on the paper.  Spouse is very angry.  Guidance:  Contact patient regarding when records will be sent.    Note: We have contacted the patient to acknowledge their concerns and tell them we would be notifying you. We also told them to expect a call within 72 hours unless specified differently above.   Please let us know if you have any questions or concerns.  Thanks,  Jose Persia, BSN, RN [employer] of Patient Experience Coordinator Direct Dial: [phone]    FAX:  [email or web address]

## 2014-02-15 NOTE — Telephone Encounter (Signed)
Yes, please draft letter dismissing patient

## 2014-02-16 NOTE — Telephone Encounter (Signed)
I have called Dr. Trena Platt office at [facility] practice at [phone] on 02/14/14 at 1:30 requesting them to fax over medical release and I called again on 02/15/14 at 4:40ish requesting medical record release. I still haven't gotten the medical release.

## 2014-07-08 ENCOUNTER — Telehealth: Payer: Self-pay

## 2014-07-08 NOTE — Telephone Encounter (Signed)
Spoke to pt and she stated that she is no longer with [facility].

## 2014-07-10 ENCOUNTER — Encounter: Payer: Self-pay | Admitting: Cardiovascular Disease

## 2014-09-14 DIAGNOSIS — E785 Hyperlipidemia, unspecified: Secondary | ICD-10-CM | POA: Diagnosis not present

## 2014-09-14 DIAGNOSIS — E1169 Type 2 diabetes mellitus with other specified complication: Secondary | ICD-10-CM | POA: Diagnosis not present

## 2014-09-14 DIAGNOSIS — E119 Type 2 diabetes mellitus without complications: Secondary | ICD-10-CM | POA: Diagnosis not present

## 2014-09-14 DIAGNOSIS — I10 Essential (primary) hypertension: Secondary | ICD-10-CM | POA: Diagnosis not present

## 2014-09-14 DIAGNOSIS — Z23 Encounter for immunization: Secondary | ICD-10-CM | POA: Diagnosis not present

## 2014-12-13 DIAGNOSIS — E1121 Type 2 diabetes mellitus with diabetic nephropathy: Secondary | ICD-10-CM | POA: Diagnosis not present

## 2014-12-13 DIAGNOSIS — E119 Type 2 diabetes mellitus without complications: Secondary | ICD-10-CM | POA: Diagnosis not present

## 2014-12-13 DIAGNOSIS — E785 Hyperlipidemia, unspecified: Secondary | ICD-10-CM | POA: Diagnosis not present

## 2014-12-13 DIAGNOSIS — E1169 Type 2 diabetes mellitus with other specified complication: Secondary | ICD-10-CM | POA: Diagnosis not present

## 2014-12-13 DIAGNOSIS — I10 Essential (primary) hypertension: Secondary | ICD-10-CM | POA: Diagnosis not present

## 2014-12-14 DIAGNOSIS — N181 Chronic kidney disease, stage 1: Secondary | ICD-10-CM | POA: Diagnosis not present

## 2014-12-14 DIAGNOSIS — R809 Proteinuria, unspecified: Secondary | ICD-10-CM | POA: Diagnosis not present

## 2014-12-14 DIAGNOSIS — I10 Essential (primary) hypertension: Secondary | ICD-10-CM | POA: Diagnosis not present

## 2014-12-14 DIAGNOSIS — E1122 Type 2 diabetes mellitus with diabetic chronic kidney disease: Secondary | ICD-10-CM | POA: Diagnosis not present

## 2014-12-14 DIAGNOSIS — N184 Chronic kidney disease, stage 4 (severe): Secondary | ICD-10-CM | POA: Diagnosis not present

## 2014-12-19 DIAGNOSIS — R809 Proteinuria, unspecified: Secondary | ICD-10-CM | POA: Diagnosis not present

## 2014-12-25 DIAGNOSIS — E1121 Type 2 diabetes mellitus with diabetic nephropathy: Secondary | ICD-10-CM | POA: Diagnosis not present

## 2015-02-12 ENCOUNTER — Telehealth: Payer: Self-pay | Admitting: General Surgery

## 2015-02-12 NOTE — Telephone Encounter (Signed)
PT CALLED TODAY TO ASK IF SHE COULD MAKE AN APPT FOR HEMORRHOIDS EXTERNAL (BLEEDING).SHE WAS LAST SEEN IN 2014( EVAL COLONOSCOPY ,2005(BENIGN NEO SKIN LOWER LIMB/HIM), 2004(DIARRHEA),2003(INCISIONAL HERNIA),96-MOLE&94(OPEN WOUND HAND).OUR NORMAL PROCEDURE IS FOR THE PT TO SEE HER PCP 1ST & THEN THEY MAKE APPT BUT SHE HAS BEEN ALONG TIME PT.

## 2015-02-13 NOTE — Telephone Encounter (Signed)
OK 

## 2015-02-14 ENCOUNTER — Encounter: Payer: Self-pay | Admitting: General Surgery

## 2015-02-26 ENCOUNTER — Ambulatory Visit (INDEPENDENT_AMBULATORY_CARE_PROVIDER_SITE_OTHER): Payer: Medicare Other | Admitting: General Surgery

## 2015-02-26 ENCOUNTER — Other Ambulatory Visit: Payer: Self-pay

## 2015-02-26 ENCOUNTER — Encounter: Payer: Self-pay | Admitting: General Surgery

## 2015-02-26 VITALS — BP 126/68 | HR 68 | Resp 12 | Ht 65.0 in | Wt 118.0 lb

## 2015-02-26 DIAGNOSIS — K648 Other hemorrhoids: Secondary | ICD-10-CM | POA: Diagnosis not present

## 2015-02-26 DIAGNOSIS — K644 Residual hemorrhoidal skin tags: Secondary | ICD-10-CM

## 2015-02-26 DIAGNOSIS — K439 Ventral hernia without obstruction or gangrene: Secondary | ICD-10-CM

## 2015-02-26 NOTE — Progress Notes (Signed)
Patient ID: Julie Holland, female   DOB: 04-13-1941, 74 y.o.   MRN: [id number]  Chief Complaint  Patient presents with  . Other    hemorrhoids    HPI Julie Holland is a 74 y.o. female here today for an evaluation of hemorrhoids. She had some bleeding stated 3 weeks ago, there was some on the toilet paper when she wiped and some that leaked, no bleeding since the one episode. She has been using baby wipes. She has been increasing her fiber. She states it is not painful to have a bowel movement. She also states she has hernia which is bothering her, the hernia is located near her umbilicus. She moves her bowels regularly about every other day. Marland KitchenHPI  Past Medical History  Diagnosis Date  . Hyperlipidemia   . Hypertension   . Asthma   . Diabetes mellitus     type II  . Arthritis   . Hemorrhoids   . Chronic kidney disease     Past Surgical History  Procedure Laterality Date  . Hernia repair    . Tubal ligation    . Total abdominal hysterectomy  01/1989  . Cholecystectomy    . Colonoscopy  02/13/13    Dr Bary Castilla  . Cataract extraction, bilateral    . Tonsillectomy  age 15    Family History  Problem Relation Age of Onset  . Colon cancer Father   . Lung cancer Brother   . Bladder Cancer Mother   . Lymphoma Sister     Social History History  Substance Use Topics  . Smoking status: Never Smoker   . Smokeless tobacco: Never Used  . Alcohol Use: No    No Known Allergies  Current Outpatient Prescriptions  Medication Sig Dispense Refill  . albuterol (PROVENTIL HFA;VENTOLIN HFA) 108 (90 BASE) MCG/ACT inhaler Inhale 2 puffs into the lungs every 6 (six) hours as needed for wheezing. 6.7 g 11  . aspirin 81 MG tablet Take 81 mg by mouth as needed.     . carvedilol (COREG) 6.25 MG tablet Take 1 tablet (6.25 mg total) by mouth 2 (two) times daily. 180 tablet 4  . Fluticasone-Salmeterol (ADVAIR DISKUS) 100-50 MCG/DOSE AEPB Inhale 1 puff into the lungs daily. 60 each 11  .  Genistein (I-COOL FOR MENOPAUSE PO) Take by mouth.    Marland Kitchen lisinopril-hydrochlorothiazide (PRINZIDE,ZESTORETIC) 20-25 MG per tablet Take 20-25 tablets by mouth daily.    . metFORMIN (GLUCOPHAGE) 850 MG tablet Take 1 tablet (850 mg total) by mouth 2 (two) times daily with a meal. 180 tablet 3  . Multiple Vitamin (MULTIVITAMIN) tablet Take 1 tablet by mouth daily.    . simvastatin (ZOCOR) 20 MG tablet Take 1 tablet (20 mg total) by mouth at bedtime. 90 tablet 3  . Triamcinolone Acetonide (NASACORT ALLERGY 24HR NA) Place into the nose.     No current facility-administered medications for this visit.    Review of Systems Review of Systems  Constitutional: Negative.   Respiratory: Negative.   Cardiovascular: Negative.   Gastrointestinal: Positive for anal bleeding. Negative for abdominal pain, constipation and rectal pain.    Blood pressure 126/68, pulse 68, resp. rate 12, height 5\' 5"  (1.651 m), weight 118 lb (53.524 kg).  Physical Exam Physical Exam  Constitutional: She is oriented to person, place, and time. She appears well-developed and well-nourished.  Eyes: Conjunctivae are normal. No scleral icterus.  Neck: Neck supple.  Cardiovascular: Normal rate, regular rhythm and normal heart sounds.   Pulmonary/Chest:  Effort normal and breath sounds normal.  Abdominal: Soft. Bowel sounds are normal. A hernia is present.    Diastasis recti positive hernia present 8 cm above umbilicus.   Genitourinary:     Hemorrhoids present at 5:00 and 7:00  Lymphadenopathy:    She has no cervical adenopathy.  Neurological: She is alert and oriented to person, place, and time.  Skin: Skin is warm and dry.    Data Reviewed No pertinent current labs or imaging studies.  Assessment    Symptomatic hemorrhoid versus low rectal polyp.  Epigastric hernia.    Plan    Repair of the above issues reviewed. Small but real risk for bleeding, infection or incontinence with hemorrhoid excision.      Plan for surgery to have hernia and hemorrhoid removed at the same time.  Patient's surgery has been scheduled for 03-20-15 at [facility].  XTA:VWPV   Robert Bellow 02/27/2015, 5:19 PM

## 2015-02-26 NOTE — Patient Instructions (Addendum)
Hemorrhoidectomy Hemorrhoidectomy is surgery to remove hemorrhoids. Hemorrhoids are veins that have become swollen in the rectum. The rectum is the area from the bottom end of the intestines to the opening where bowel movements leave the body. Hemorrhoids can be uncomfortable. They can cause itching, bleeding and pain if a blood clot forms in them (thrombose). If hemorrhoids are small, surgery may not be needed. But if they cover a larger area, surgery is usually suggested.  LET YOUR CAREGIVER KNOW ABOUT:   Any allergies.  All medications you are taking, including:  Herbs, eyedrops, over-the-counter medications and creams.  Blood thinners (anticoagulants), aspirin or other drugs that could affect blood clotting.  Use of steroids (by mouth or as creams).  Previous problems with anesthetics, including local anesthetics.  Possibility of pregnancy, if this applies.  Any history of blood clots.  Any history of bleeding or other blood problems.  Previous surgery.  Smoking history.  Other health problems. RISKS AND COMPLICATIONS All surgery carries some risk. However, hemorrhoid surgery usually goes smoothly. Possible complications could include:  Urinary retention.  Bleeding.  Infection.  A painful incision.  A reaction to the anesthesia (this is not common). BEFORE THE PROCEDURE   Stop using aspirin and non-steroidal anti-inflammatory drugs (NSAIDs) for pain relief. This includes prescription drugs and over-the-counter drugs such as ibuprofen and naproxen. Also stop taking vitamin E. If possible, do this two weeks before your surgery.  If you take blood-thinners, ask your healthcare provider when you should stop taking them.  You will probably have blood and urine tests done several days before your surgery.  Do not eat or drink for about 8 hours before the surgery.  Arrive at least an hour before the surgery, or whenever your surgeon recommends. This will give you time to  check in and fill out any needed paperwork.  Hemorrhoidectomy is often an outpatient procedure. This means you will be able to go home the same day. Sometimes, though, people stay overnight in the hospital after the procedure. Ask your surgeon what to expect. Either way, make arrangements in advance for someone to drive you home. PROCEDURE   The preparation:  You will change into a hospital gown.  You will be given an IV. A needle will be inserted in your arm. Medication can flow directly into your body through this needle.  You might be given an enema to clear your rectum.  Once in the operating room, you will probably lie on your side or be repositioned later to lying on your stomach.  You will be given anesthesia (medication) so you will not feel anything during the surgery. The surgery often is done with local anesthesia (the area near the hemorrhoids will be numb and you will be drowsy but awake). Sometimes, general anesthesia is used (you will be asleep during the procedure).  The procedure:  There are a few different procedures for hemorrhoids. Be sure to ask you surgeon about the procedure, the risks and benefits.  Be sure to ask about what you need to do to take care of the wound, if there is one. AFTER THE PROCEDURE  You will stay in a recovery area until the anesthesia has worn off. Your blood pressure and pulse will be checked every so often.  You may feel a lot of pain in the area of the rectum.  Take all pain medication prescribed by your surgeon. Ask before taking any over-the-counter pain medicines.  Sometimes sitting in a warm bath can help relieve  your pain.  To make sure you have bowel movements without straining:  You will probably need to take stool softeners (usually a pill) for a few days.  You should drink 8 to 10 glasses of water each day.  Your activity will be restricted for awhile. Ask your caregiver for a list of what you should and should not do  while you recover. Document Released: 06/22/2009 Document Revised: 11/17/2011 Document Reviewed: 06/22/2009 [facility] Patient Information 2015 [place], [place]. This information is not intended to replace advice given to you by your health care provider. Make sure you discuss any questions you have with your health care provider.  Patient's surgery has been scheduled for 03-20-15 at [facility].

## 2015-02-27 DIAGNOSIS — K644 Residual hemorrhoidal skin tags: Secondary | ICD-10-CM | POA: Insufficient documentation

## 2015-02-27 DIAGNOSIS — K429 Umbilical hernia without obstruction or gangrene: Secondary | ICD-10-CM | POA: Insufficient documentation

## 2015-02-27 NOTE — H&P (Signed)
Patient ID: Julie Holland, female   DOB: [birth year], 74 y.o.   MRN: [id number]  Chief Complaint  Patient presents with  . Other    hemorrhoids    HPI Julie Holland is a 74 y.o. female here today for an evaluation of hemorrhoids. She had some bleeding stated 3 weeks ago, there was some on the toilet paper when she wiped and some that leaked, no bleeding since the one episode. She has been using baby wipes. She has been increasing her fiber. She states it is not painful to have a bowel movement. She also states she has hernia which is bothering her, the hernia is located near her umbilicus. She moves her bowels regularly about every other day. Marland KitchenHPI  Past Medical History  Diagnosis Date  . Hyperlipidemia   . Hypertension   . Asthma   . Diabetes mellitus     type II  . Arthritis   . Hemorrhoids   . Chronic kidney disease     Past Surgical History  Procedure Laterality Date  . Hernia repair    . Tubal ligation    . Total abdominal hysterectomy  01/1989  . Cholecystectomy    . Colonoscopy  02/13/13    Dr Bary Castilla  . Cataract extraction, bilateral    . Tonsillectomy  age 35    Family History  Problem Relation Age of Onset  . Colon cancer Father   . Lung cancer Brother   . Bladder Cancer Mother   . Lymphoma Sister     Social History History  Substance Use Topics  . Smoking status: Never Smoker   . Smokeless tobacco: Never Used  . Alcohol Use: No    No Known Allergies  Current Outpatient Prescriptions  Medication Sig Dispense Refill  . albuterol (PROVENTIL HFA;VENTOLIN HFA) 108 (90 BASE) MCG/ACT inhaler Inhale 2 puffs into the lungs every 6 (six) hours as needed for wheezing. 6.7 g 11  . aspirin 81 MG tablet Take 81 mg by mouth as needed.     . carvedilol (COREG) 6.25 MG tablet Take 1 tablet (6.25 mg total) by mouth 2 (two) times daily. 180 tablet 4  . Fluticasone-Salmeterol (ADVAIR DISKUS) 100-50 MCG/DOSE AEPB Inhale 1 puff into the lungs daily. 60 each 11  .  Genistein (I-COOL FOR MENOPAUSE PO) Take by mouth.    Marland Kitchen lisinopril-hydrochlorothiazide (PRINZIDE,ZESTORETIC) 20-25 MG per tablet Take 20-25 tablets by mouth daily.    . metFORMIN (GLUCOPHAGE) 850 MG tablet Take 1 tablet (850 mg total) by mouth 2 (two) times daily with a meal. 180 tablet 3  . Multiple Vitamin (MULTIVITAMIN) tablet Take 1 tablet by mouth daily.    . simvastatin (ZOCOR) 20 MG tablet Take 1 tablet (20 mg total) by mouth at bedtime. 90 tablet 3  . Triamcinolone Acetonide (NASACORT ALLERGY 24HR NA) Place into the nose.     No current facility-administered medications for this visit.    Review of Systems Review of Systems  Constitutional: Negative.   Respiratory: Negative.   Cardiovascular: Negative.   Gastrointestinal: Positive for anal bleeding. Negative for abdominal pain, constipation and rectal pain.    Blood pressure 126/68, pulse 68, resp. rate 12, height 5\' 5"  (1.651 m), weight 118 lb (53.524 kg).  Physical Exam Physical Exam  Constitutional: She is oriented to person, place, and time. She appears well-developed and well-nourished.  Eyes: Conjunctivae are normal. No scleral icterus.  Neck: Neck supple.  Cardiovascular: Normal rate, regular rhythm and normal heart sounds.   Pulmonary/Chest:  Effort normal and breath sounds normal.  Abdominal: Soft. Bowel sounds are normal. A hernia is present.    Diastasis recti positive hernia present 8 cm above umbilicus.   Genitourinary:     Hemorrhoids present at 5:00 and 7:00  Lymphadenopathy:    She has no cervical adenopathy.  Neurological: She is alert and oriented to person, place, and time.  Skin: Skin is warm and dry.    Data Reviewed No pertinent current labs or imaging studies.  Assessment    Symptomatic hemorrhoid versus low rectal polyp.  Epigastric hernia.    Plan    Repair of the above issues reviewed. Small but real risk for bleeding, infection or incontinence with hemorrhoid excision.      Plan for surgery to have hernia and hemorrhoid removed at the same time.  Patient's surgery has been scheduled for 03-20-15 at [facility].  YQM:GNOI   Charlynn Salih, [place] Gleason

## 2015-03-09 DIAGNOSIS — C801 Malignant (primary) neoplasm, unspecified: Secondary | ICD-10-CM

## 2015-03-09 HISTORY — DX: Malignant (primary) neoplasm, unspecified: C80.1

## 2015-03-09 HISTORY — PX: HERNIA REPAIR: SHX51

## 2015-03-13 ENCOUNTER — Encounter
Admission: RE | Admit: 2015-03-13 | Discharge: 2015-03-13 | Disposition: A | Discharge status: Home / Self Care | Payer: Medicare Other | Source: Ambulatory Visit | Attending: General Surgery | Admitting: General Surgery

## 2015-03-13 DIAGNOSIS — Z01810 Encounter for preprocedural cardiovascular examination: Secondary | ICD-10-CM | POA: Insufficient documentation

## 2015-03-13 DIAGNOSIS — Z01812 Encounter for preprocedural laboratory examination: Secondary | ICD-10-CM | POA: Insufficient documentation

## 2015-03-13 LAB — CBC WITH DIFFERENTIAL/PLATELET
Basophils Absolute: 0 10*3/uL (ref 0–0.1)
Basophils Relative: 1 %
Eosinophils Absolute: 0 10*3/uL (ref 0–0.7)
Eosinophils Relative: 1 %
HCT: 35.8 % (ref 35.0–47.0)
Hemoglobin: 11.7 g/dL — ABNORMAL LOW (ref 12.0–16.0)
Lymphocytes Relative: 17 %
Lymphs Abs: 0.9 10*3/uL — ABNORMAL LOW (ref 1.0–3.6)
MCH: 30 pg (ref 26.0–34.0)
MCHC: 32.7 g/dL (ref 32.0–36.0)
MCV: 91.7 fL (ref 80.0–100.0)
Monocytes Absolute: 1.2 10*3/uL — ABNORMAL HIGH (ref 0.2–0.9)
Monocytes Relative: 23 %
Neutro Abs: 2.9 10*3/uL (ref 1.4–6.5)
Neutrophils Relative %: 58 %
Platelets: 84 10*3/uL — ABNORMAL LOW (ref 150–440)
RBC: 3.9 MIL/uL (ref 3.80–5.20)
RDW: 15.3 % — ABNORMAL HIGH (ref 11.5–14.5)
WBC: 5.1 10*3/uL (ref 3.6–11.0)

## 2015-03-13 LAB — BASIC METABOLIC PANEL
Anion gap: 9 (ref 5–15)
BUN: 17 mg/dL (ref 6–20)
CO2: 31 mmol/L (ref 22–32)
Calcium: 9.7 mg/dL (ref 8.9–10.3)
Chloride: 95 mmol/L — ABNORMAL LOW (ref 101–111)
Creatinine, Ser: 0.59 mg/dL (ref 0.44–1.00)
GFR calc Af Amer: >60 mL/min (ref >60)
GFR calc non Af Amer: >60 mL/min (ref >60)
Glucose, Bld: 98 mg/dL (ref 65–99)
Potassium: 3.6 mmol/L (ref 3.5–5.1)
Sodium: 135 mmol/L (ref 135–145)

## 2015-03-13 NOTE — Patient Instructions (Signed)
  Your procedure is scheduled on: Tuesday March 20, 2015 Report to Same Day Surgery. To find out your arrival time please call [phone] between 1PM - 3PM on Monday March 19, 2015.  Remember: Instructions that are not followed completely may result in serious medical risk, up to and including death, or upon the discretion of your surgeon and anesthesiologist your surgery may need to be rescheduled.    __x_ 1. Do not eat food or drink liquids after midnight. No gum chewing or hard candies.     ____ 2. No Alcohol for 24 hours before or after surgery.   ____ 3. Bring all medications with you on the day of surgery if instructed.    __x__ 4. Notify your doctor if there is any change in your medical condition     (cold, fever, infections).     Do not wear jewelry, make-up, hairpins, clips or nail polish.  Do not wear lotions, powders, or perfumes. You may wear deodorant.  Do not shave 48 hours prior to surgery. Men may shave face and neck.  Do not bring valuables to the hospital.    [facility] is not responsible for any belongings or valuables.               Contacts, dentures or bridgework may not be worn into surgery.  Leave your suitcase in the car. After surgery it may be brought to your room.  For patients admitted to the hospital, discharge time is determined by your treatment team.   Patients discharged the day of surgery will not be allowed to drive home.    Please read over the following fact sheets that you were given:   [facility] Preparing for Surgery  _x___ Take these medicines the morning of surgery with A SIP OF WATER:    1. carvedilol (COREG)     ____ Fleet Enema (as directed)   _x__ Use CHG Soap as directed  _x___ Use inhalers on the day of surgery  _x___ Stop metformin 2 days prior to surgery    ____ Take 1/2 of usual insulin dose the night before surgery and none on the morning of surgery.   ____ Dennis Bast may continue taking your daily 81 mg of  Aspirin.  ____ Stop Anti-inflammatories on Does not apply.  Ok to take Tylenol for pain.  ____ Stop supplements until after surgery.    ____ Bring C-Pap to the hospital.

## 2015-03-14 ENCOUNTER — Ambulatory Visit: Payer: Self-pay | Admitting: Family Medicine

## 2015-03-19 ENCOUNTER — Telehealth: Payer: Self-pay | Admitting: Family Medicine

## 2015-03-19 MED ORDER — CARVEDILOL 6.25 MG PO TABS: 6.2500 mg | ORAL_TABLET | Freq: Two times a day (BID) | ORAL | Status: DC

## 2015-03-19 NOTE — Telephone Encounter (Signed)
Pt had appointment with you but had to cancel because you where not going to be in the office. She is hernia repair on 03-20-15 and will reschedule after that. She is requesting a refill on Carvedilol 6.25mg . [facility], she will be completely out by the end of the week.[phone]

## 2015-03-19 NOTE — Telephone Encounter (Signed)
Refilled medication. Pt is scheduling appt

## 2015-03-20 ENCOUNTER — Ambulatory Visit
Admission: RE | Admit: 2015-03-20 | Discharge: 2015-03-20 | Disposition: A | Discharge status: Home / Self Care | Payer: Medicare Other | Source: Ambulatory Visit | Attending: General Surgery | Admitting: General Surgery

## 2015-03-20 ENCOUNTER — Encounter: Payer: Self-pay | Admitting: Anesthesiology

## 2015-03-20 ENCOUNTER — Ambulatory Visit: Payer: Medicare Other | Admitting: Anesthesiology

## 2015-03-20 ENCOUNTER — Encounter
Admission: RE | Disposition: A | Discharge status: Home / Self Care | Payer: Self-pay | Source: Ambulatory Visit | Attending: General Surgery

## 2015-03-20 DIAGNOSIS — E785 Hyperlipidemia, unspecified: Secondary | ICD-10-CM | POA: Insufficient documentation

## 2015-03-20 DIAGNOSIS — K439 Ventral hernia without obstruction or gangrene: Secondary | ICD-10-CM | POA: Insufficient documentation

## 2015-03-20 DIAGNOSIS — K644 Residual hemorrhoidal skin tags: Secondary | ICD-10-CM | POA: Diagnosis not present

## 2015-03-20 DIAGNOSIS — Z79899 Other long term (current) drug therapy: Secondary | ICD-10-CM | POA: Diagnosis not present

## 2015-03-20 DIAGNOSIS — N189 Chronic kidney disease, unspecified: Secondary | ICD-10-CM | POA: Insufficient documentation

## 2015-03-20 DIAGNOSIS — K648 Other hemorrhoids: Secondary | ICD-10-CM | POA: Diagnosis not present

## 2015-03-20 DIAGNOSIS — E1122 Type 2 diabetes mellitus with diabetic chronic kidney disease: Secondary | ICD-10-CM | POA: Diagnosis not present

## 2015-03-20 DIAGNOSIS — J45909 Unspecified asthma, uncomplicated: Secondary | ICD-10-CM | POA: Diagnosis not present

## 2015-03-20 DIAGNOSIS — K429 Umbilical hernia without obstruction or gangrene: Secondary | ICD-10-CM | POA: Diagnosis not present

## 2015-03-20 DIAGNOSIS — M1990 Unspecified osteoarthritis, unspecified site: Secondary | ICD-10-CM | POA: Insufficient documentation

## 2015-03-20 DIAGNOSIS — I129 Hypertensive chronic kidney disease with stage 1 through stage 4 chronic kidney disease, or unspecified chronic kidney disease: Secondary | ICD-10-CM | POA: Diagnosis not present

## 2015-03-20 DIAGNOSIS — Z7982 Long term (current) use of aspirin: Secondary | ICD-10-CM | POA: Insufficient documentation

## 2015-03-20 HISTORY — PX: HEMORRHOID SURGERY: SHX153

## 2015-03-20 HISTORY — PX: EPIGASTRIC HERNIA REPAIR: SHX404

## 2015-03-20 LAB — GLUCOSE, CAPILLARY
Glucose-Capillary: 158 mg/dL — ABNORMAL HIGH (ref 65–99)
Glucose-Capillary: 134 mg/dL — ABNORMAL HIGH (ref 65–99)

## 2015-03-20 SURGERY — REPAIR, HERNIA, EPIGASTRIC, ADULT
Anesthesia: General | Wound class: Clean

## 2015-03-20 MED ORDER — FAMOTIDINE 20 MG PO TABS
ORAL_TABLET | ORAL | Status: AC
  Administered 2015-03-20: 20 mg via ORAL
  Filled 2015-03-20: qty 1

## 2015-03-20 MED ORDER — PROMETHAZINE HCL 25 MG/ML IJ SOLN
INTRAMUSCULAR | Status: AC
  Administered 2015-03-20: 6.25 mg via INTRAVENOUS
  Filled 2015-03-20: qty 1

## 2015-03-20 MED ORDER — HYDROMORPHONE HCL 1 MG/ML IJ SOLN
0.2500 mg | INTRAMUSCULAR | Status: DC | PRN
  Administered 2015-03-20 (×2): 0.25 mg via INTRAVENOUS

## 2015-03-20 MED ORDER — BUPIVACAINE-EPINEPHRINE (PF) 0.5% -1:200000 IJ SOLN
INTRAMUSCULAR | Status: AC
  Filled 2015-03-20: qty 30

## 2015-03-20 MED ORDER — PROPOFOL 10 MG/ML IV BOLUS
INTRAVENOUS | Status: DC | PRN
  Administered 2015-03-20: 120 mg via INTRAVENOUS

## 2015-03-20 MED ORDER — BUPIVACAINE LIPOSOME 1.3 % IJ SUSP
INTRAMUSCULAR | Status: AC
  Filled 2015-03-20: qty 20

## 2015-03-20 MED ORDER — SODIUM CHLORIDE 0.9 % IV SOLN
1.0000 g | INTRAVENOUS | Status: AC
  Administered 2015-03-20: 1 g via INTRAVENOUS
  Filled 2015-03-20: qty 1

## 2015-03-20 MED ORDER — HYDROMORPHONE HCL 1 MG/ML IJ SOLN
INTRAMUSCULAR | Status: AC
  Administered 2015-03-20: 0.25 mg via INTRAVENOUS
  Filled 2015-03-20: qty 1

## 2015-03-20 MED ORDER — BUPIVACAINE HCL (PF) 0.5 % IJ SOLN
INTRAMUSCULAR | Status: AC
Start: 2015-03-20 — End: 2015-03-20
  Filled 2015-03-20: qty 30

## 2015-03-20 MED ORDER — LIDOCAINE-EPINEPHRINE (PF) 1 %-1:200000 IJ SOLN
INTRAMUSCULAR | Status: DC | PRN
  Administered 2015-03-20: 30 mL

## 2015-03-20 MED ORDER — FENTANYL CITRATE (PF) 100 MCG/2ML IJ SOLN
25.0000 ug | INTRAMUSCULAR | Status: DC | PRN
  Administered 2015-03-20 (×2): 25 ug via INTRAVENOUS

## 2015-03-20 MED ORDER — EPHEDRINE SULFATE 50 MG/ML IJ SOLN
INTRAMUSCULAR | Status: DC | PRN
  Administered 2015-03-20 (×2): 10 mg via INTRAVENOUS

## 2015-03-20 MED ORDER — HYDROCODONE-ACETAMINOPHEN 5-325 MG PO TABS: 1.0000 | ORAL_TABLET | ORAL | Status: DC | PRN

## 2015-03-20 MED ORDER — LIDOCAINE-EPINEPHRINE (PF) 1 %-1:200000 IJ SOLN
INTRAMUSCULAR | Status: AC
  Filled 2015-03-20: qty 30

## 2015-03-20 MED ORDER — ONDANSETRON HCL 4 MG/2ML IJ SOLN
INTRAMUSCULAR | Status: AC
  Filled 2015-03-20: qty 2

## 2015-03-20 MED ORDER — SODIUM CHLORIDE 0.9 % IV SOLN
INTRAVENOUS | Status: DC
  Administered 2015-03-20: 07:00:00 via INTRAVENOUS

## 2015-03-20 MED ORDER — ACETAMINOPHEN 10 MG/ML IV SOLN
INTRAVENOUS | Status: DC | PRN
  Administered 2015-03-20: 1000 mg via INTRAVENOUS

## 2015-03-20 MED ORDER — LIDOCAINE HCL (CARDIAC) 20 MG/ML IV SOLN
INTRAVENOUS | Status: DC | PRN
  Administered 2015-03-20: 60 mg via INTRAVENOUS

## 2015-03-20 MED ORDER — ACETAMINOPHEN 10 MG/ML IV SOLN
INTRAVENOUS | Status: AC
  Filled 2015-03-20: qty 100

## 2015-03-20 MED ORDER — ONDANSETRON HCL 4 MG/2ML IJ SOLN
INTRAMUSCULAR | Status: DC | PRN
  Administered 2015-03-20: 4 mg via INTRAVENOUS

## 2015-03-20 MED ORDER — FAMOTIDINE 20 MG PO TABS
20.0000 mg | ORAL_TABLET | Freq: Once | ORAL | Status: AC
  Administered 2015-03-20: 20 mg via ORAL

## 2015-03-20 MED ORDER — FENTANYL CITRATE (PF) 100 MCG/2ML IJ SOLN
INTRAMUSCULAR | Status: AC
  Administered 2015-03-20: 25 ug via INTRAVENOUS
  Filled 2015-03-20: qty 2

## 2015-03-20 MED ORDER — FENTANYL CITRATE (PF) 100 MCG/2ML IJ SOLN
INTRAMUSCULAR | Status: DC | PRN
  Administered 2015-03-20: 50 ug via INTRAVENOUS

## 2015-03-20 MED ORDER — ONDANSETRON HCL 4 MG/2ML IJ SOLN
4.0000 mg | Freq: Once | INTRAMUSCULAR | Status: AC | PRN
  Administered 2015-03-20: 4 mg via INTRAVENOUS

## 2015-03-20 MED ORDER — PROMETHAZINE HCL 25 MG/ML IJ SOLN
6.2500 mg | Freq: Four times a day (QID) | INTRAMUSCULAR | Status: DC | PRN
  Administered 2015-03-20: 6.25 mg via INTRAVENOUS

## 2015-03-20 MED ORDER — BUPIVACAINE LIPOSOME 1.3 % IJ SUSP
INTRAMUSCULAR | Status: DC | PRN
  Administered 2015-03-20: 20 mL

## 2015-03-20 SURGICAL SUPPLY — 45 items
BENZOIN TINCTURE PRP APPL 2/3 (GAUZE/BANDAGES/DRESSINGS) ×2 IMPLANT
BRIEF STRETCH MATERNITY 2XLG (MISCELLANEOUS) ×2 IMPLANT
CANISTER SUCT 1200ML W/VALVE (MISCELLANEOUS) ×2 IMPLANT
CHLORAPREP W/TINT 26ML (MISCELLANEOUS) ×2 IMPLANT
COVER LIGHT HANDLE STERIS (MISCELLANEOUS) ×2 IMPLANT
DRAPE LAPAROTOMY 100X77 ABD (DRAPES) ×2 IMPLANT
DRAPE LEGGINS SURG 28X43 STRL (DRAPES) ×2 IMPLANT
DRAPE UNDER BUTTOCK W/FLU (DRAPES) ×2 IMPLANT
DRESSING TELFA 4X3 1S ST N-ADH (GAUZE/BANDAGES/DRESSINGS) ×2 IMPLANT
DRSG GAUZE PETRO 6X36 STRIP ST (GAUZE/BANDAGES/DRESSINGS) ×2 IMPLANT
DRSG TEGADERM 4X4.75 (GAUZE/BANDAGES/DRESSINGS) ×2 IMPLANT
GLOVE BIO SURGEON STRL SZ7.5 (GLOVE) ×10 IMPLANT
GLOVE INDICATOR 8.0 STRL GRN (GLOVE) ×10 IMPLANT
GOWN STRL REUS W/ TWL LRG LVL3 (GOWN DISPOSABLE) ×3 IMPLANT
GOWN STRL REUS W/TWL LRG LVL3 (GOWN DISPOSABLE) ×3
HARMONIC SCALPEL FOCUS (MISCELLANEOUS) IMPLANT
JELLY LUB 2OZ STRL (MISCELLANEOUS) ×1
JELLY LUBE 2OZ STRL (MISCELLANEOUS) ×1 IMPLANT
KIT RM TURNOVER STRD PROC AR (KITS) ×2 IMPLANT
LABEL OR SOLS (LABEL) IMPLANT
NDL SAFETY 22GX1.5 (NEEDLE) ×2 IMPLANT
NDL SAFETY 25GX1.5 (NEEDLE) IMPLANT
NDL SAFETY ECLIPSE 18X1.5 (NEEDLE) ×1 IMPLANT
NEEDLE HYPO 18GX1.5 SHARP (NEEDLE) ×1
NS IRRIG 500ML POUR BTL (IV SOLUTION) ×2 IMPLANT
PACK BASIN MINOR ARMC (MISCELLANEOUS) ×2 IMPLANT
PAD GROUND ADULT SPLIT (MISCELLANEOUS) ×2 IMPLANT
PAD OB MATERNITY 4.3X12.25 (PERSONAL CARE ITEMS) ×2 IMPLANT
PAD PREP 24X41 OB/GYN DISP (PERSONAL CARE ITEMS) ×2 IMPLANT
SPONGE LAP 18X18 5 PK (GAUZE/BANDAGES/DRESSINGS) IMPLANT
STAPLER SKIN PROX 35W (STAPLE) IMPLANT
STRIP CLOSURE SKIN 1/2X4 (GAUZE/BANDAGES/DRESSINGS) ×2 IMPLANT
SUT CHROMIC 3 0 SH 27 (SUTURE) ×4 IMPLANT
SUT SILK 0 CT 1 30 (SUTURE) ×2 IMPLANT
SUT SURGILON 0 30 BLK (SUTURE) ×6 IMPLANT
SUT SURGILON 0 BLK (SUTURE) IMPLANT
SUT VIC AB 2-0 BRD 54 (SUTURE) ×2 IMPLANT
SUT VIC AB 2-0 CT1 27 (SUTURE) ×1
SUT VIC AB 2-0 CT1 TAPERPNT 27 (SUTURE) ×1 IMPLANT
SUT VIC AB 3-0 SH 27 (SUTURE) ×1
SUT VIC AB 3-0 SH 27X BRD (SUTURE) ×1 IMPLANT
SUT VIC AB 4-0 FS2 27 (SUTURE) ×2 IMPLANT
SUT VICRYL+ 3-0 144IN (SUTURE) ×2 IMPLANT
SYR 30ML LL (SYRINGE) ×2 IMPLANT
SYR CONTROL 10ML (SYRINGE) ×2 IMPLANT

## 2015-03-20 NOTE — Transfer of Care (Signed)
Immediate Anesthesia Transfer of Care Note  Patient: Julie Holland  Procedure(s) Performed: Procedure(s): HERNIA REPAIR EPIGASTRIC ADULT (N/A) HEMORRHOIDECTOMY (N/A)  Patient Location: PACU  Anesthesia Type:General  Level of Consciousness: sedated  Airway & Oxygen Therapy: Patient Spontanous Breathing and Patient connected to face mask oxygen  Post-op Assessment: Report given to RN and Post -op Vital signs reviewed and stable  Post vital signs: Reviewed and stable  Last Vitals:  Filed Vitals:   03/20/15 0836  BP: 118/70  Pulse: 68  Temp: 36.6 C  Resp:     Complications: No apparent anesthesia complications

## 2015-03-20 NOTE — Anesthesia Preprocedure Evaluation (Addendum)
Anesthesia Evaluation  Patient identified by MRN, date of birth, ID band Patient awake    Reviewed: Allergy & Precautions, NPO status , Patient's Chart, lab work & pertinent test results, reviewed documented beta blocker date and time   Airway Mallampati: II  TM Distance: >3 FB     Dental  (+) Partial Upper, Chipped   Pulmonary asthma ,          Cardiovascular hypertension, + dysrhythmias     Neuro/Psych    GI/Hepatic   Endo/Other  diabetes  Renal/GU Renal disease     Musculoskeletal  (+) Arthritis -,   Abdominal   Peds  Hematology   Anesthesia Other Findings   Reproductive/Obstetrics                            Anesthesia Physical Anesthesia Plan  ASA: III  Anesthesia Plan: General   Post-op Pain Management:    Induction: Intravenous  Airway Management Planned: LMA  Additional Equipment:   Intra-op Plan:   Post-operative Plan:   Informed Consent: I have reviewed the patients History and Physical, chart, labs and discussed the procedure including the risks, benefits and alternatives for the proposed anesthesia with the patient or authorized representative who has indicated his/her understanding and acceptance.     Plan Discussed with: CRNA  Anesthesia Plan Comments:         Anesthesia Quick Evaluation

## 2015-03-20 NOTE — H&P (Signed)
No change in clinical exam. For ventral hernia and hemorrhoidectomy.

## 2015-03-20 NOTE — OR Nursing (Signed)
Dr. Bary Castilla here.  OK to DC home.  Nausea resolved

## 2015-03-20 NOTE — Discharge Instructions (Signed)

## 2015-03-20 NOTE — Op Note (Signed)
Preoperative diagnosis: 1) epigastric hernia, 2) symptomatic hemorrhoids.  Postoperative diagnosis: Same.  Operative procedure:  Epigastric hernia, hemorrhoidectomy.  Operative surgeon: Lanney Gins, M.D.  Anesthesia: Gen. by LMA, 1% Xylocaine with 1-200,000 units of epinephrine, 30 mL; Exparel: 20 mL local infiltration in the perianal tissue.  Estimated blood loss: Less than 5 mL.  Clinical note this 74 year old woman has developed a symptomatic epigastric hernia as well as hemorrhoids unresponsive to conservative measures. She was admitted for elective hernia repair and hemorrhoidectomy. She made use of a fleets enema prior to the procedure. She had preoperative antibiotic. SCD stockings for DVT prophylaxis.  Operative note: With the patient under adequate general anesthesia the abdomen was prepped with corpora and draped. A vertical incision was made after instillation of local metastatic at the site previously identified as the hernia. Skin and subcutaneous tissue was divided sharply and remaining dissection completed with electrocautery. A small 1 center fascial defect was noted 8 cm above the umbilicus. This was repaired with interrupted oh Surgilon sutures in a transverse orientation. Examination of the undersurface of the fascia showed only preperitoneal fat. The adipose tissue was approximated with a running 3-0 Vicryls suture. The skin was closed with a running 4-0 Vicryls suture. Benzoin, Steri-Strips, Telfa and Tegaderm dressing applied.  The patient was placed in dorsal lithotomy position and the perineum prepped with Betadine solution and draped. 10 mL of Xylocaine with epinephrine as well as 20 mL of Exparel was infiltrated for postoperative comfort. The symptomatically hemorrhoids were at the 5 and 7:00 position. These were excised making use of a Ferguson technique. 3-0 chromic sutures were used for hemostasis and a running locking fashion. These were sent separately as the 5:00  lesion had a polypoid area on the hemorrhoid necessitating separate examination. Good hemostasis was noted. A Vaseline gauze pack was placed for removal in the recovery room.  The patient was taken to the recovery room in stable condition.

## 2015-03-20 NOTE — Anesthesia Postprocedure Evaluation (Signed)
  Anesthesia Post-op Note  Patient: Julie Holland  Procedure(s) Performed: Procedure(s): HERNIA REPAIR EPIGASTRIC ADULT (N/A) HEMORRHOIDECTOMY (N/A)  Anesthesia type:General  Patient location: PACU  Post pain: Pain level controlled  Post assessment: Post-op Vital signs reviewed, Patient's Cardiovascular Status Stable, Respiratory Function Stable, Patent Airway and No signs of Nausea or vomiting  Post vital signs: Reviewed and stable  Last Vitals:  Filed Vitals:   03/20/15 1055  BP: 138/62  Pulse: 65  Temp: 36.5 C  Resp: 16    Level of consciousness: awake, alert  and patient cooperative  Complications: No apparent anesthesia complications

## 2015-03-20 NOTE — Anesthesia Procedure Notes (Signed)
Procedure Name: LMA Insertion Date/Time: 03/20/2015 7:36 AM Performed by: Delaney Meigs Pre-anesthesia Checklist: Patient identified, Emergency Drugs available, Suction available, Patient being monitored and Timeout performed Patient Re-evaluated:Patient Re-evaluated prior to inductionOxygen Delivery Method: Circle system utilized and Simple face mask Preoxygenation: Pre-oxygenation with 100% oxygen Intubation Type: IV induction Ventilation: Mask ventilation without difficulty LMA Size: 3.5 Number of attempts: 1 Placement Confirmation: breath sounds checked- equal and bilateral and positive ETCO2 Tube secured with: Tape

## 2015-03-21 ENCOUNTER — Telehealth: Payer: Self-pay | Admitting: Family Medicine

## 2015-03-21 ENCOUNTER — Telehealth: Payer: Self-pay | Admitting: General Surgery

## 2015-03-21 DIAGNOSIS — J4530 Mild persistent asthma, uncomplicated: Secondary | ICD-10-CM

## 2015-03-21 NOTE — Telephone Encounter (Signed)
PT CALLED TODAY & IS P/O FROM EXTERNAL HEMORRHOIDS, EPIGASTRIC HERNIA DONE 03-20-15.THERE IS SOME CONFUSION WITH THE PT ON HER POST OP INSTRUCTIONS.THE PW STATES TAKE A COLACE TWICE A DAY.HOWEREVER THEY ARE SAYING THE NURSE TOLD THEM TO GET DULCOLAX & THIS IS WHAT HER HUSBAND PURCHASED.WHAT DOES THE PT REALLY NEED TO TAKE?

## 2015-03-21 NOTE — Telephone Encounter (Signed)
Pt is needing NEW RX for ADVAIR. She had an appt with you last wk but we had to cancel it for ou were on vacation. She does has another appt with you but her medication will be totally out. Send request to mail order [facility].

## 2015-03-21 NOTE — Telephone Encounter (Signed)
I spoke with the patient and notified her that she was supposed to take the stool softener Colace. She states that her pain is much better today and the she slept well last night. She is aware of instructions.

## 2015-03-22 LAB — SURGICAL PATHOLOGY

## 2015-03-22 MED ORDER — FLUTICASONE-SALMETEROL 100-50 MCG/DOSE IN AEPB: 1.0000 | INHALATION_SPRAY | Freq: Every day | RESPIRATORY_TRACT | Status: DC

## 2015-03-27 ENCOUNTER — Encounter: Payer: Self-pay | Admitting: General Surgery

## 2015-03-27 ENCOUNTER — Ambulatory Visit (INDEPENDENT_AMBULATORY_CARE_PROVIDER_SITE_OTHER): Payer: Medicare Other | Admitting: General Surgery

## 2015-03-27 VITALS — BP 138/64 | HR 62 | Resp 12 | Ht 64.0 in | Wt 117.0 lb

## 2015-03-27 DIAGNOSIS — D099 Carcinoma in situ, unspecified: Secondary | ICD-10-CM

## 2015-03-27 DIAGNOSIS — K648 Other hemorrhoids: Secondary | ICD-10-CM

## 2015-03-27 DIAGNOSIS — K439 Ventral hernia without obstruction or gangrene: Secondary | ICD-10-CM

## 2015-03-27 DIAGNOSIS — K644 Residual hemorrhoidal skin tags: Secondary | ICD-10-CM

## 2015-03-27 NOTE — Telephone Encounter (Signed)
LFT MESS ABOUT RX

## 2015-03-27 NOTE — Progress Notes (Signed)
Patient ID: Julie Holland, female   DOB: 10/01/1940, 74 y.o.   MRN: [id number]  Chief Complaint  Patient presents with  . Follow-up    epigastric hernia and hemorrhoidectomy    HPI Julie Holland is a 74 y.o. female here today for her post op epigastric hernia and hemorrhoidectomy done on 03/20/15. Patient states she is bleeds a little with bowel movements.  HPI  Past Medical History  Diagnosis Date  . Hyperlipidemia   . Hypertension   . Asthma   . Diabetes mellitus     type II  . Arthritis   . Hemorrhoids   . Chronic kidney disease     stage 1  . 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    Past Surgical History  Procedure Laterality Date  . Tubal ligation    . Total abdominal hysterectomy  01/1989  . Cholecystectomy    . Colonoscopy  02/13/13    Dr Bary Castilla  . Cataract extraction, bilateral    . Tonsillectomy  age 79  . Epigastric hernia repair N/A 03/20/2015    Procedure: HERNIA REPAIR EPIGASTRIC ADULT;  Surgeon: Robert Bellow, MD;  Location: [facility] ORS;  Service: General;  Laterality: N/A;  . Hemorrhoid surgery N/A 03/20/2015    Procedure: HEMORRHOIDECTOMY;  Surgeon: Robert Bellow, MD;  Location: [facility];  Service: General;  Laterality: N/A;  . Hernia repair  July 2016    Epigastric hernia, primary repair    Family History  Problem Relation Age of Onset  . Colon cancer Father   . Lung cancer Brother   . Bladder Cancer Mother   . Lymphoma Sister     Social History History  Substance Use Topics  . Smoking status: Never Smoker   . Smokeless tobacco: Never Used  . Alcohol Use: No    No Known Allergies  Current Outpatient Prescriptions  Medication Sig Dispense Refill  . albuterol (PROVENTIL HFA;VENTOLIN HFA) 108 (90 BASE) MCG/ACT inhaler Inhale 2 puffs into the lungs every 6 (six) hours as needed for wheezing. 6.7 g 11  . aspirin 81 MG tablet Take 81 mg by mouth every morning.     . carvedilol  (COREG) 6.25 MG tablet Take 1 tablet (6.25 mg total) by mouth 2 (two) times daily. 180 tablet 4  . Fluticasone-Salmeterol (ADVAIR DISKUS) 100-50 MCG/DOSE AEPB Inhale 1 puff into the lungs daily. 14 each 1  . Genistein (I-COOL FOR MENOPAUSE PO) Take 1 capsule by mouth every morning.     Marland Kitchen HYDROcodone-acetaminophen (NORCO) 5-325 MG per tablet Take 1 tablet by mouth every 4 (four) hours as needed for moderate pain. 30 tablet 0  . lisinopril-hydrochlorothiazide (PRINZIDE,ZESTORETIC) 20-25 MG per tablet Take 20-25 tablets by mouth daily after breakfast.     . metFORMIN (GLUCOPHAGE) 850 MG tablet Take 1 tablet (850 mg total) by mouth 2 (two) times daily with a meal. 180 tablet 3  . Multiple Vitamin (MULTIVITAMIN) tablet Take 1 tablet by mouth daily.    . psyllium (REGULOID) 0.52 G capsule Take 0.52 g by mouth daily. Takes 2 capsules in am.    . simvastatin (ZOCOR) 20 MG tablet Take 1 tablet (20 mg total) by mouth at bedtime. 90 tablet 3  . Triamcinolone Acetonide (NASACORT ALLERGY 24HR NA) Place into the nose 1 day or 1 dose. As needed     No current facility-administered medications for this visit.    Review of Systems Review of  Systems  Constitutional: Negative.   Respiratory: Negative.   Cardiovascular: Negative.     Blood pressure 138/64, pulse 62, resp. rate 12, height 5\' 4"  (1.626 m), weight 117 lb (53.071 kg).  Physical Exam Physical Exam  Constitutional: She is oriented to person, place, and time. She appears well-developed and well-nourished.  Eyes: Conjunctivae are normal. No scleral icterus.  Neck: Neck supple.  Pulmonary/Chest: Effort normal and breath sounds normal.  Abdominal: Soft. Normal appearance and bowel sounds are normal. There is no hepatomegaly. There is no tenderness. No hernia.  Lymphadenopathy:    She has no cervical adenopathy.  Neurological: She is alert and oriented to person, place, and time.  Skin: Skin is warm and dry.  Hemorrhoidectomy site is healing  well.   Data Reviewed Surgical Pathology  CASE: [id number]  PATIENT: Julie Holland  Surgical Pathology Report      SPECIMEN SUBMITTED:  A. Hemorrhoid at 5 o'clock  B. Hemorrhoid at 7 o'clock   CLINICAL HISTORY:  None provided   PRE-OPERATIVE DIAGNOSIS:  External hemorrhoids, epigastric hernia   POST-OPERATIVE DIAGNOSIS:  External hemorrhoids, epigastric hernia      DIAGNOSIS:  A. HEMORRHOID AT 5:00; HEMORRHOIDECTOMY:  - HEMORRHOID SURFACED BY SQUAMOUS AND COLUMNAR MUCOSA.  - NEGATIVE FOR DYSPLASIA AND MALIGNANCY.   B. HEMORRHOID AT 7:00; HEMORRHOIDECTOMY:  - FOCAL HIGH-GRADE SQUAMOUS INTRAEPITHELIAL LESION (HSIL, ANAL  INTRAEPITHELIAL NEOPLASIA 3) WITHIN A HEMORRHOID SURFACED MOSTLY BY  COLUMNAR MUCOSA.   Comment:  Both hemorrhoids were entirely submitted for histology. 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 The involved  epithelium is located in a mucosal invagination cut in cross-section;  additional sections were reviewed and the margins do not appear to be  involved. Careful clinical examination is recommended to assess for  residual anal HSIL and to exclude involvement of gyn sites.   Assessment    Doing well status post repair of epigastric hernia and hemorrhoidectomy.    Plan    The patient reports annual exams with Dr. Ammie Dalton as well as annual Pap smears. We'll notify him of the recent identification of the HPV associated findings on hemorrhoidectomy. She is scheduled for her annual exam in September, 2016.  We'll plan for follow-up exam here in one month.    Patient to return in one month.  PCP:  Keith Rake   Dr. Kerin Ransom, [place] 03/27/2015, 8:01 PM

## 2015-03-27 NOTE — Patient Instructions (Addendum)
Patient to return in one month. 

## 2015-04-17 ENCOUNTER — Encounter: Payer: Self-pay | Admitting: Family Medicine

## 2015-04-17 ENCOUNTER — Ambulatory Visit (INDEPENDENT_AMBULATORY_CARE_PROVIDER_SITE_OTHER): Payer: Medicare Other | Admitting: Family Medicine

## 2015-04-17 VITALS — BP 136/61 | HR 96 | Temp 98.5°F | Resp 18 | Ht 64.0 in | Wt 117.1 lb

## 2015-04-17 DIAGNOSIS — E785 Hyperlipidemia, unspecified: Secondary | ICD-10-CM | POA: Diagnosis not present

## 2015-04-17 DIAGNOSIS — I10 Essential (primary) hypertension: Secondary | ICD-10-CM

## 2015-04-17 DIAGNOSIS — E119 Type 2 diabetes mellitus without complications: Secondary | ICD-10-CM | POA: Diagnosis not present

## 2015-04-17 MED ORDER — METFORMIN HCL 850 MG PO TABS: 850.0000 mg | ORAL_TABLET | Freq: Two times a day (BID) | ORAL | Status: DC

## 2015-04-17 MED ORDER — ACCU-CHEK SMARTVIEW VI STRP: ORAL_STRIP | Status: DC

## 2015-04-17 MED ORDER — LISINOPRIL-HYDROCHLOROTHIAZIDE 20-25 MG PO TABS: 1.0000 | ORAL_TABLET | Freq: Every day | ORAL | Status: DC

## 2015-04-17 NOTE — Progress Notes (Signed)
Name: Julie Holland   MRN: [id number]    DOB: 03-19-1941   Date:04/17/2015       Progress Note  Subjective  Chief Complaint  Chief Complaint  Patient presents with  . Follow-up    3 mo.  . Diabetes  . Asthma  . Hyperlipidemia  . Medication Refill    Diabetes She presents for her follow-up diabetic visit. She has type 2 diabetes mellitus. Her disease course has been stable. Pertinent negatives for hypoglycemia include no headaches. Pertinent negatives for diabetes include no chest pain. Pertinent negatives for diabetic complications include no CVA. Current diabetic treatment includes oral agent (monotherapy). She is following a diabetic diet. Her breakfast blood glucose range is generally 90-110 mg/dl. An ACE inhibitor/angiotensin II receptor blocker is being taken. Eye exam is current.  Hyperlipidemia This is a chronic problem. The problem is controlled. Recent lipid tests were reviewed and are normal. Pertinent negatives include no chest pain, leg pain, myalgias or shortness of breath. Current antihyperlipidemic treatment includes statins.  Hypertension This is a chronic problem. The problem is unchanged. The problem is controlled. Pertinent negatives include no chest pain, headaches, palpitations or shortness of breath. Past treatments include diuretics, ACE inhibitors and beta blockers. The current treatment provides significant improvement. There is no history of CAD/MI or CVA.      Past Medical History  Diagnosis Date  . Hyperlipidemia   . Hypertension   . Asthma   . Diabetes mellitus     type II  . Arthritis   . Hemorrhoids   . Chronic kidney disease     stage 1  . 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    Past Surgical History  Procedure Laterality Date  . Tubal ligation    . Total abdominal hysterectomy  01/1989  . Cholecystectomy    . Colonoscopy  02/13/13    Dr Bary Castilla  . Cataract extraction, bilateral     . Tonsillectomy  age 1  . Epigastric hernia repair N/A 03/20/2015    Procedure: HERNIA REPAIR EPIGASTRIC ADULT;  Surgeon: Robert Bellow, MD;  Location: [facility];  Service: General;  Laterality: N/A;  . Hemorrhoid surgery N/A 03/20/2015    Procedure: HEMORRHOIDECTOMY;  Surgeon: Robert Bellow, MD;  Location: [facility];  Service: General;  Laterality: N/A;  . Hernia repair  July 2016    Epigastric hernia, primary repair    Family History  Problem Relation Age of Onset  . Colon cancer Father   . Lung cancer Brother   . Bladder Cancer Mother   . Lymphoma Sister     History   Social History  . Marital Status: Married    Spouse Name: N/A  . Number of Children: N/A  . Years of Education: N/A   Occupational History  . Not on file.   Social History Main Topics  . Smoking status: Never Smoker   . Smokeless tobacco: Never Used  . Alcohol Use: No  . Drug Use: No  . Sexual Activity: Yes    Birth Control/ Protection: Surgical   Other Topics Concern  . Not on file   Social History Narrative     Current outpatient prescriptions:  .  albuterol (PROVENTIL HFA;VENTOLIN HFA) 108 (90 BASE) MCG/ACT inhaler, Inhale 2 puffs into the lungs every 6 (six) hours as needed for wheezing., Disp: 6.7 g, Rfl: 11 .  aspirin 81 MG tablet, Take 81 mg by mouth every  morning. , Disp: , Rfl:  .  carvedilol (COREG) 6.25 MG tablet, Take 1 tablet (6.25 mg total) by mouth 2 (two) times daily., Disp: 180 tablet, Rfl: 4 .  Fluticasone-Salmeterol (ADVAIR DISKUS) 100-50 MCG/DOSE AEPB, Inhale 1 puff into the lungs daily., Disp: 14 each, Rfl: 1 .  Genistein (I-COOL FOR MENOPAUSE PO), Take 1 capsule by mouth every morning. , Disp: , Rfl:  .  lisinopril-hydrochlorothiazide (PRINZIDE,ZESTORETIC) 20-25 MG per tablet, Take 20-25 tablets by mouth daily after breakfast. , Disp: , Rfl:  .  metFORMIN (GLUCOPHAGE) 850 MG tablet, Take 1 tablet (850 mg total) by mouth 2 (two) times daily with a meal., Disp: 180  tablet, Rfl: 3 .  Multiple Vitamin (MULTIVITAMIN) tablet, Take 1 tablet by mouth daily., Disp: , Rfl:  .  simvastatin (ZOCOR) 20 MG tablet, Take 1 tablet (20 mg total) by mouth at bedtime., Disp: 90 tablet, Rfl: 3 .  Triamcinolone Acetonide (NASACORT ALLERGY 24HR NA), Place into the nose 1 day or 1 dose. As needed, Disp: , Rfl:  .  HYDROcodone-acetaminophen (NORCO) 5-325 MG per tablet, Take 1 tablet by mouth every 4 (four) hours as needed for moderate pain. (Patient not taking: Reported on 04/17/2015), Disp: 30 tablet, Rfl: 0 .  psyllium (REGULOID) 0.52 G capsule, Take 0.52 g by mouth daily. Takes 2 capsules in am., Disp: , Rfl:   No Known Allergies   Review of Systems  Respiratory: Negative for shortness of breath.   Cardiovascular: Negative for chest pain and palpitations.  Musculoskeletal: Negative for myalgias.  Neurological: Negative for headaches.     Objective  Filed Vitals:   04/17/15 1335  BP: 136/61  Pulse: 96  Temp: 98.5 F (36.9 C)  TempSrc: Oral  Resp: 18  Height: 5\' 4"  (1.626 m)  Weight: 117 lb 1.6 oz (53.116 kg)  SpO2: 97%    Physical Exam  Constitutional: She is well-developed, well-nourished, and in no distress.  Cardiovascular: Normal rate and regular rhythm.   Pulmonary/Chest: Effort normal and breath sounds normal.  Abdominal: Soft. Bowel sounds are normal.  Nursing note and vitals reviewed.    Assessment & Plan  1. Essential hypertension  - Basic Metabolic Panel (BMET) - lisinopril-hydrochlorothiazide (PRINZIDE,ZESTORETIC) 20-25 MG per tablet; Take 1 tablet by mouth daily after breakfast.  Dispense: 90 tablet; Refill: 0  2. Diabetes mellitus, controlled  - HgB A1c - metFORMIN (GLUCOPHAGE) 850 MG tablet; Take 1 tablet (850 mg total) by mouth 2 (two) times daily with a meal.  Dispense: 180 tablet; Refill: 3 - ACCU-CHEK SMARTVIEW test strip; Use as instructed  Dispense: 50 each; Refill: 11  3. Hyperlipidemia  - Lipid Profile - Hepatic function  panel   Julie Holland [facility] 04/17/2015 1:47 PM

## 2015-04-21 LAB — HEMOGLOBIN A1C
Est. average glucose Bld gHb Est-mCnc: 143 mg/dL
Hgb A1c MFr Bld: 6.6 % — ABNORMAL HIGH (ref 4.8–5.6)

## 2015-04-21 LAB — BASIC METABOLIC PANEL WITH GFR
BUN/Creatinine Ratio: 21 (ref 11–26)
BUN: 12 mg/dL (ref 8–27)
CO2: 29 mmol/L (ref 18–29)
Calcium: 9.8 mg/dL (ref 8.7–10.3)
Chloride: 93 mmol/L — ABNORMAL LOW (ref 97–108)
Creatinine, Ser: 0.58 mg/dL (ref 0.57–1.00)
GFR calc Af Amer: 104 mL/min/{1.73_m2}
GFR calc non Af Amer: 90 mL/min/{1.73_m2}
Glucose: 124 mg/dL — ABNORMAL HIGH (ref 65–99)
Potassium: 4.1 mmol/L (ref 3.5–5.2)
Sodium: 136 mmol/L (ref 134–144)

## 2015-04-21 LAB — LIPID PANEL
Chol/HDL Ratio: 2.5 ratio (ref 0.0–4.4)
Cholesterol, Total: 134 mg/dL (ref 100–199)
HDL: 54 mg/dL
LDL Calculated: 63 mg/dL (ref 0–99)
Triglycerides: 85 mg/dL (ref 0–149)
VLDL Cholesterol Cal: 17 mg/dL (ref 5–40)

## 2015-04-21 LAB — HEPATIC FUNCTION PANEL
ALT: 23 [IU]/L (ref 0–32)
AST: 31 [IU]/L (ref 0–40)
Albumin: 4.3 g/dL (ref 3.5–4.8)
Alkaline Phosphatase: 55 [IU]/L (ref 39–117)
Bilirubin Total: 0.5 mg/dL (ref 0.0–1.2)
Bilirubin, Direct: 0.16 mg/dL (ref 0.00–0.40)
Total Protein: 6.9 g/dL (ref 6.0–8.5)

## 2015-04-26 ENCOUNTER — Ambulatory Visit (INDEPENDENT_AMBULATORY_CARE_PROVIDER_SITE_OTHER): Payer: Medicare Other | Admitting: General Surgery

## 2015-04-26 ENCOUNTER — Encounter: Payer: Self-pay | Admitting: General Surgery

## 2015-04-26 VITALS — BP 122/60 | HR 78 | Resp 13 | Ht 64.0 in | Wt 117.0 lb

## 2015-04-26 DIAGNOSIS — K648 Other hemorrhoids: Secondary | ICD-10-CM

## 2015-04-26 DIAGNOSIS — K439 Ventral hernia without obstruction or gangrene: Secondary | ICD-10-CM

## 2015-04-26 DIAGNOSIS — D099 Carcinoma in situ, unspecified: Secondary | ICD-10-CM

## 2015-04-26 DIAGNOSIS — K644 Residual hemorrhoidal skin tags: Secondary | ICD-10-CM

## 2015-04-26 NOTE — Progress Notes (Signed)
Patient ID: Julie Holland, female   DOB: Feb 27, 1941, 74 y.o.   MRN: [id number]  Chief Complaint  Patient presents with  . Routine Post Op    epigastric hernia and hemorrhoidectomy    HPI Julie Holland is a 74 y.o. female here today for her post op epigastric hernia and hemorrhoidectomy done on 03/20/15. She states that she is doing much better. She has increased her fiber and this has made using the bathroom much easier. She states that she had very little discomfort. The patient reports no difficulty with control of bowel function. No incontinence. HPI  Past Medical History  Diagnosis Date  . Hyperlipidemia   . Hypertension   . Asthma   . Diabetes mellitus     type II  . Arthritis   . Hemorrhoids   . Chronic kidney disease     stage 1  . 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    Past Surgical History  Procedure Laterality Date  . Tubal ligation    . Total abdominal hysterectomy  01/1989  . Cholecystectomy    . Colonoscopy  02/13/13    Dr Bary Castilla  . Cataract extraction, bilateral    . Tonsillectomy  age 84  . Epigastric hernia repair N/A 03/20/2015    Procedure: HERNIA REPAIR EPIGASTRIC ADULT;  Surgeon: Robert Bellow, MD;  Location: [facility];  Service: General;  Laterality: N/A;  . Hemorrhoid surgery N/A 03/20/2015    Procedure: HEMORRHOIDECTOMY;  Surgeon: Robert Bellow, MD;  Location: [facility];  Service: General;  Laterality: N/A;  . Hernia repair  July 2016    Epigastric hernia, primary repair    Family History  Problem Relation Age of Onset  . Colon cancer Father   . Lung cancer Brother   . Bladder Cancer Mother   . Lymphoma Sister     Social History Social History  Substance Use Topics  . Smoking status: Never Smoker   . Smokeless tobacco: Never Used  . Alcohol Use: No    No Known Allergies  Current Outpatient Prescriptions  Medication Sig Dispense Refill  . albuterol (PROVENTIL  HFA;VENTOLIN HFA) 108 (90 BASE) MCG/ACT inhaler Inhale 2 puffs into the lungs every 6 (six) hours as needed for wheezing. 6.7 g 11  . aspirin 81 MG tablet Take 81 mg by mouth every morning.     . carvedilol (COREG) 6.25 MG tablet Take 1 tablet (6.25 mg total) by mouth 2 (two) times daily. 180 tablet 4  . Fluticasone-Salmeterol (ADVAIR DISKUS) 100-50 MCG/DOSE AEPB Inhale 1 puff into the lungs daily. 14 each 1  . Genistein (I-COOL FOR MENOPAUSE PO) Take 1 capsule by mouth every morning.     Marland Kitchen lisinopril-hydrochlorothiazide (PRINZIDE,ZESTORETIC) 20-25 MG per tablet Take 1 tablet by mouth daily after breakfast. 90 tablet 0  . metFORMIN (GLUCOPHAGE) 850 MG tablet Take 1 tablet (850 mg total) by mouth 2 (two) times daily with a meal. 180 tablet 3  . Multiple Vitamin (MULTIVITAMIN) tablet Take 1 tablet by mouth daily.    . psyllium (REGULOID) 0.52 G capsule Take 0.52 g by mouth daily. Takes 2 capsules in am.    . simvastatin (ZOCOR) 20 MG tablet Take 1 tablet (20 mg total) by mouth at bedtime. 90 tablet 3  . Triamcinolone Acetonide (NASACORT ALLERGY 24HR NA) Place into the nose 1 day or 1 dose. As needed     No current facility-administered medications for this  visit.    Review of Systems Review of Systems  Constitutional: Negative.   Respiratory: Negative.   Cardiovascular: Negative.   Gastrointestinal: Negative.     Blood pressure 122/60, pulse 78, resp. rate 13, height 5\' 4"  (1.626 m), weight 117 lb (53.071 kg).  Physical Exam Physical Exam  Constitutional: She is oriented to person, place, and time. She appears well-developed and well-nourished.  Abdominal: Soft. Normal appearance and bowel sounds are normal. There is no tenderness.  Epigastric hernia site healing well.   Genitourinary: Rectum normal.  Hemorrhoidectomy site well healed.   Neurological: She is alert and oriented to person, place, and time.  Skin: Skin is warm and dry.  Psychiatric: She has a normal mood and affect.     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  Assessment    Doing well status post hernia repair and hemorrhoidectomy.    Plan    The patient will have her annual GYN exam next month. She reports annual Pap smears. This should detect the possibility of another HPV associated squamous cell lesion.    PCP: Raynelle Chary 04/28/2015, 6:56 AM

## 2015-04-26 NOTE — Patient Instructions (Signed)
Follow up as needed

## 2015-06-04 ENCOUNTER — Telehealth: Payer: Self-pay | Admitting: Family Medicine

## 2015-06-04 LAB — HM MAMMOGRAPHY: HM Mammogram: NORMAL

## 2015-06-04 NOTE — Telephone Encounter (Signed)
Pt needs refill on the simvastatin to be called into [facility].

## 2015-06-06 MED ORDER — SIMVASTATIN 20 MG PO TABS: 20.0000 mg | ORAL_TABLET | Freq: Every day | ORAL | Status: DC

## 2015-06-06 NOTE — Telephone Encounter (Signed)
Simvastatin 20 mg has been refilled and sent to [facility] mail order

## 2015-06-27 ENCOUNTER — Ambulatory Visit (INDEPENDENT_AMBULATORY_CARE_PROVIDER_SITE_OTHER): Payer: Medicare Other

## 2015-06-27 DIAGNOSIS — Z23 Encounter for immunization: Secondary | ICD-10-CM | POA: Diagnosis not present

## 2015-07-04 ENCOUNTER — Encounter: Payer: Self-pay | Admitting: Family Medicine

## 2015-07-09 DIAGNOSIS — H43393 Other vitreous opacities, bilateral: Secondary | ICD-10-CM | POA: Diagnosis not present

## 2015-07-09 DIAGNOSIS — H43313 Vitreous membranes and strands, bilateral: Secondary | ICD-10-CM | POA: Diagnosis not present

## 2015-07-10 ENCOUNTER — Encounter: Payer: Self-pay | Admitting: Family Medicine

## 2015-07-10 DIAGNOSIS — E119 Type 2 diabetes mellitus without complications: Secondary | ICD-10-CM | POA: Diagnosis not present

## 2015-07-10 DIAGNOSIS — H43313 Vitreous membranes and strands, bilateral: Secondary | ICD-10-CM | POA: Diagnosis not present

## 2015-07-10 DIAGNOSIS — H40009 Preglaucoma, unspecified, unspecified eye: Secondary | ICD-10-CM | POA: Diagnosis not present

## 2015-07-11 DIAGNOSIS — H40009 Preglaucoma, unspecified, unspecified eye: Secondary | ICD-10-CM | POA: Diagnosis not present

## 2015-07-25 ENCOUNTER — Other Ambulatory Visit: Payer: Self-pay | Admitting: Family Medicine

## 2015-07-30 ENCOUNTER — Telehealth: Payer: Self-pay | Admitting: Family Medicine

## 2015-07-30 DIAGNOSIS — J4530 Mild persistent asthma, uncomplicated: Secondary | ICD-10-CM

## 2015-07-30 DIAGNOSIS — I10 Essential (primary) hypertension: Secondary | ICD-10-CM

## 2015-07-30 MED ORDER — FLUTICASONE-SALMETEROL 100-50 MCG/DOSE IN AEPB
1.0000 | INHALATION_SPRAY | Freq: Every day | RESPIRATORY_TRACT | Status: DC
Start: 2015-07-30 — End: 2015-08-07

## 2015-07-30 MED ORDER — LISINOPRIL-HYDROCHLOROTHIAZIDE 20-25 MG PO TABS: 1.0000 | ORAL_TABLET | Freq: Every day | ORAL | Status: DC

## 2015-07-30 NOTE — Telephone Encounter (Signed)
Pt needs refill on Advair and Lisinopril to be sent to [facility].

## 2015-07-30 NOTE — Telephone Encounter (Signed)
Medication has been refilled and sent to [facility] Mail order

## 2015-08-07 ENCOUNTER — Other Ambulatory Visit: Payer: Self-pay | Admitting: Family Medicine

## 2015-08-07 DIAGNOSIS — J4540 Moderate persistent asthma, uncomplicated: Secondary | ICD-10-CM

## 2015-08-17 ENCOUNTER — Encounter: Payer: Self-pay | Admitting: Family Medicine

## 2015-08-17 ENCOUNTER — Ambulatory Visit (INDEPENDENT_AMBULATORY_CARE_PROVIDER_SITE_OTHER): Payer: Medicare Other | Admitting: Family Medicine

## 2015-08-17 VITALS — BP 122/70 | HR 79 | Temp 97.8°F | Resp 15 | Ht 64.0 in | Wt 119.9 lb

## 2015-08-17 DIAGNOSIS — I10 Essential (primary) hypertension: Secondary | ICD-10-CM | POA: Diagnosis not present

## 2015-08-17 DIAGNOSIS — E119 Type 2 diabetes mellitus without complications: Secondary | ICD-10-CM

## 2015-08-17 DIAGNOSIS — J4540 Moderate persistent asthma, uncomplicated: Secondary | ICD-10-CM | POA: Diagnosis not present

## 2015-08-17 DIAGNOSIS — J45909 Unspecified asthma, uncomplicated: Secondary | ICD-10-CM | POA: Insufficient documentation

## 2015-08-17 DIAGNOSIS — E785 Hyperlipidemia, unspecified: Secondary | ICD-10-CM

## 2015-08-17 LAB — GLUCOSE, POCT (MANUAL RESULT ENTRY): POC Glucose: 103 mg/dl — AB (ref 70–99)

## 2015-08-17 MED ORDER — FLUTICASONE-SALMETEROL 100-50 MCG/DOSE IN AEPB: 1.0000 | INHALATION_SPRAY | Freq: Every day | RESPIRATORY_TRACT | Status: DC

## 2015-08-17 NOTE — Progress Notes (Signed)
Name: Julie Holland   MRN: [id number]    DOB: [birth year]   Date:08/17/2015       Progress Note  Subjective  Chief Complaint  Chief Complaint  Patient presents with  . Follow-up    3 mo  . Diabetes  . Asthma  . Hyperlipidemia    Diabetes She presents for her follow-up diabetic visit. She has type 2 diabetes mellitus. Her disease course has been stable. There are no hypoglycemic associated symptoms. Pertinent negatives for hypoglycemia include no headaches. Pertinent negatives for diabetes include no blurred vision and no chest pain. There are no diabetic complications. Pertinent negatives for diabetic complications include no CVA. Risk factors for coronary artery disease include dyslipidemia. Current diabetic treatment includes oral agent (monotherapy). Her weight is stable. She participates in exercise three times a week. Her breakfast blood glucose range is generally 90-110 mg/dl. An ACE inhibitor/angiotensin II receptor blocker is being taken.  Asthma There is no chest tightness, cough, shortness of breath or sputum production. This is a chronic problem. The problem has been unchanged. Pertinent negatives include no chest pain, fever, headaches or myalgias. Her symptoms are alleviated by beta-agonist and steroid inhaler. Her past medical history is significant for asthma.  Hyperlipidemia This is a chronic problem. The problem is controlled. Recent lipid tests were reviewed and are normal. Pertinent negatives include no chest pain, leg pain, myalgias or shortness of breath. Current antihyperlipidemic treatment includes statins, diet change and exercise. There are no compliance problems.   Hypertension This is a chronic problem. The problem is unchanged. The problem is controlled. Pertinent negatives include no blurred vision, chest pain, headaches, palpitations or shortness of breath. Past treatments include ACE inhibitors, beta blockers and diuretics. There is no history of kidney disease,  CAD/MI or CVA.    Past Medical History  Diagnosis Date  . Hyperlipidemia   . Hypertension   . Asthma   . Diabetes mellitus     type II  . Arthritis   . Hemorrhoids   . Chronic kidney disease     stage 1  . 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    Past Surgical History  Procedure Laterality Date  . Tubal ligation    . Total abdominal hysterectomy  01/1989  . Cholecystectomy    . Colonoscopy  02/13/13    Dr Bary Castilla  . Cataract extraction, bilateral    . Tonsillectomy  age 74  . Epigastric hernia repair N/A 03/20/2015    Procedure: HERNIA REPAIR EPIGASTRIC ADULT;  Surgeon: Robert Bellow, MD;  Location: [facility] ORS;  Service: General;  Laterality: N/A;  . Hemorrhoid surgery N/A 03/20/2015    Procedure: HEMORRHOIDECTOMY;  Surgeon: Robert Bellow, MD;  Location: [facility] OR[facility];  Service: General;  Laterality: N/A;  . Hernia repair  July 2016    Epigastric hernia, primary repair    Family History  Problem Relation Age of Onset  . Colon cancer Father   . Lung cancer Brother   . Bladder Cancer Mother   . Lymphoma Sister     Social History   Social History  . Marital Status: Married    Spouse Name: N/A  . Number of Children: N/A  . Years of Education: N/A   Occupational History  . Not on file.   Social History Main Topics  . Smoking status: Never Smoker   . Smokeless tobacco: Never Used  . Alcohol Use: No  .  Drug Use: No  . Sexual Activity: Yes    Birth Control/ Protection: Surgical   Other Topics Concern  . Not on file   Social History Narrative     Current outpatient prescriptions:  .  ACCU-CHEK SMARTVIEW test strip, , Disp: , Rfl:  .  albuterol (PROVENTIL HFA;VENTOLIN HFA) 108 (90 BASE) MCG/ACT inhaler, Inhale 2 puffs into the lungs every 6 (six) hours as needed for wheezing., Disp: 6.7 g, Rfl: 11 .  aspirin 81 MG tablet, Take 81 mg by mouth every morning. , Disp: , Rfl:  .  carvedilol  (COREG) 6.25 MG tablet, Take 1 tablet (6.25 mg total) by mouth 2 (two) times daily., Disp: 180 tablet, Rfl: 4 .  fluticasone (FLONASE) 50 MCG/ACT nasal spray, Place 1 spray into both nostrils daily., Disp: , Rfl:  .  Fluticasone-Salmeterol (ADVAIR DISKUS) 100-50 MCG/DOSE AEPB, Inhale 1 puff into the lungs daily., Disp: 60 each, Rfl: 2 .  lisinopril-hydrochlorothiazide (PRINZIDE,ZESTORETIC) 20-25 MG tablet, Take 1 tablet by mouth daily after breakfast., Disp: 90 tablet, Rfl: 0 .  metFORMIN (GLUCOPHAGE) 850 MG tablet, Take 1 tablet (850 mg total) by mouth 2 (two) times daily with a meal., Disp: 180 tablet, Rfl: 3 .  Multiple Vitamin (MULTIVITAMIN) tablet, Take 1 tablet by mouth daily., Disp: , Rfl:  .  simvastatin (ZOCOR) 20 MG tablet, Take 1 tablet by mouth at  bedtime, Disp: 90 tablet, Rfl: 1  No Known Allergies   Review of Systems  Constitutional: Negative for fever and chills.  Eyes: Negative for blurred vision and double vision.  Respiratory: Negative for cough, sputum production and shortness of breath.   Cardiovascular: Negative for chest pain and palpitations.  Musculoskeletal: Negative for myalgias.  Neurological: Negative for headaches.    Objective  Filed Vitals:   08/17/15 0838  BP: 122/70  Pulse: 79  Temp: 97.8 F (36.6 C)  TempSrc: Oral  Resp: 15  Height: 5\' 4"  (1.626 m)  Weight: 119 lb 14.4 oz (54.386 kg)  SpO2: 98%    Physical Exam  Constitutional: She is oriented to person, place, and time and well-developed, well-nourished, and in no distress.  HENT:  Head: Normocephalic and atraumatic.  Eyes: Pupils are equal, round, and reactive to light.  Cardiovascular: Normal rate, regular rhythm and normal heart sounds.   No murmur heard. Pulmonary/Chest: Effort normal and breath sounds normal. She has no wheezes.  Abdominal: Soft. Bowel sounds are normal. There is no tenderness.  Neurological: She is alert and oriented to person, place, and time.  Nursing note and  vitals reviewed.    Assessment & Plan  1. Controlled type 2 diabetes mellitus without complication, without long-term current use of insulin (HCC)  - POCT HgB A1C - POCT Glucose (CBG)  2. Hyperlipidemia We will obtain lipid and liver enzymes and follow-up. - Comprehensive Metabolic Panel (CMET) - Lipid Profile  3. Essential hypertension BP stable and controlled on present therapy  4. Asthma, moderate persistent, uncomplicated Persistent symptoms, resolved with ICS therapy. - Fluticasone-Salmeterol (ADVAIR DISKUS) 100-50 MCG/DOSE AEPB; Inhale 1 puff into the lungs daily.  Dispense: 90 each; Refill: 2   Scott Vanderveer [facility] 08/17/2015 8:47 AM

## 2015-08-18 LAB — COMPREHENSIVE METABOLIC PANEL
ALT: 25 IU/L (ref 0–32)
AST: 30 IU/L (ref 0–40)
Albumin/Globulin Ratio: 1.7 (ref 1.1–2.5)
Albumin: 4.5 g/dL (ref 3.5–4.8)
Alkaline Phosphatase: 65 IU/L (ref 39–117)
BUN/Creatinine Ratio: 25 (ref 11–26)
BUN: 15 mg/dL (ref 8–27)
Bilirubin Total: 0.7 mg/dL (ref 0.0–1.2)
CO2: 27 mmol/L (ref 18–29)
Calcium: 9.6 mg/dL (ref 8.7–10.3)
Chloride: 93 mmol/L — ABNORMAL LOW (ref 97–106)
Creatinine, Ser: 0.61 mg/dL (ref 0.57–1.00)
GFR calc Af Amer: 103 mL/min/{1.73_m2} (ref >59)
GFR calc non Af Amer: 89 mL/min/{1.73_m2} (ref >59)
Globulin, Total: 2.6 g/dL (ref 1.5–4.5)
Glucose: 118 mg/dL — ABNORMAL HIGH (ref 65–99)
Potassium: 4 mmol/L (ref 3.5–5.2)
Sodium: 136 mmol/L (ref 136–144)
Total Protein: 7.1 g/dL (ref 6.0–8.5)

## 2015-08-18 LAB — LIPID PANEL
Chol/HDL Ratio: 2.1 ratio units (ref 0.0–4.4)
Cholesterol, Total: 111 mg/dL (ref 100–199)
HDL: 52 mg/dL (ref >39)
LDL Calculated: 46 mg/dL (ref 0–99)
Triglycerides: 64 mg/dL (ref 0–149)
VLDL Cholesterol Cal: 13 mg/dL (ref 5–40)

## 2015-09-17 ENCOUNTER — Other Ambulatory Visit: Payer: Self-pay | Admitting: Family Medicine

## 2015-10-08 ENCOUNTER — Encounter: Payer: Self-pay | Admitting: Emergency Medicine

## 2015-10-08 ENCOUNTER — Emergency Department
Admission: EM | Admit: 2015-10-08 | Discharge: 2015-10-08 | Disposition: A | Discharge status: Home / Self Care | Payer: Medicare Other | Attending: Emergency Medicine | Admitting: Emergency Medicine

## 2015-10-08 ENCOUNTER — Emergency Department: Payer: Medicare Other

## 2015-10-08 DIAGNOSIS — W231XXA Caught, crushed, jammed, or pinched between stationary objects, initial encounter: Secondary | ICD-10-CM | POA: Diagnosis not present

## 2015-10-08 DIAGNOSIS — S61310A Laceration without foreign body of right index finger with damage to nail, initial encounter: Secondary | ICD-10-CM | POA: Diagnosis not present

## 2015-10-08 DIAGNOSIS — N181 Chronic kidney disease, stage 1: Secondary | ICD-10-CM | POA: Diagnosis not present

## 2015-10-08 DIAGNOSIS — S67190A Crushing injury of right index finger, initial encounter: Secondary | ICD-10-CM | POA: Diagnosis not present

## 2015-10-08 DIAGNOSIS — Y9389 Activity, other specified: Secondary | ICD-10-CM | POA: Diagnosis not present

## 2015-10-08 DIAGNOSIS — Y9289 Other specified places as the place of occurrence of the external cause: Secondary | ICD-10-CM | POA: Diagnosis not present

## 2015-10-08 DIAGNOSIS — Y998 Other external cause status: Secondary | ICD-10-CM | POA: Insufficient documentation

## 2015-10-08 DIAGNOSIS — Z7982 Long term (current) use of aspirin: Secondary | ICD-10-CM | POA: Diagnosis not present

## 2015-10-08 DIAGNOSIS — S61210A Laceration without foreign body of right index finger without damage to nail, initial encounter: Secondary | ICD-10-CM | POA: Diagnosis not present

## 2015-10-08 DIAGNOSIS — I129 Hypertensive chronic kidney disease with stage 1 through stage 4 chronic kidney disease, or unspecified chronic kidney disease: Secondary | ICD-10-CM | POA: Insufficient documentation

## 2015-10-08 DIAGNOSIS — S6721XA Crushing injury of right hand, initial encounter: Secondary | ICD-10-CM

## 2015-10-08 DIAGNOSIS — Z79899 Other long term (current) drug therapy: Secondary | ICD-10-CM | POA: Diagnosis not present

## 2015-10-08 DIAGNOSIS — E119 Type 2 diabetes mellitus without complications: Secondary | ICD-10-CM | POA: Diagnosis not present

## 2015-10-08 DIAGNOSIS — Z7951 Long term (current) use of inhaled steroids: Secondary | ICD-10-CM | POA: Diagnosis not present

## 2015-10-08 DIAGNOSIS — S60121A Contusion of right index finger with damage to nail, initial encounter: Secondary | ICD-10-CM | POA: Diagnosis not present

## 2015-10-08 DIAGNOSIS — M79644 Pain in right finger(s): Secondary | ICD-10-CM | POA: Diagnosis not present

## 2015-10-08 DIAGNOSIS — S6991XA Unspecified injury of right wrist, hand and finger(s), initial encounter: Secondary | ICD-10-CM | POA: Diagnosis present

## 2015-10-08 DIAGNOSIS — Z7984 Long term (current) use of oral hypoglycemic drugs: Secondary | ICD-10-CM | POA: Diagnosis not present

## 2015-10-08 DIAGNOSIS — S6791XA Crushing injury of unspecified part(s) of right wrist, hand and fingers, initial encounter: Secondary | ICD-10-CM | POA: Diagnosis not present

## 2015-10-08 DIAGNOSIS — S6010XA Contusion of unspecified finger with damage to nail, initial encounter: Secondary | ICD-10-CM

## 2015-10-08 DIAGNOSIS — S61219A Laceration without foreign body of unspecified finger without damage to nail, initial encounter: Secondary | ICD-10-CM

## 2015-10-08 MED ORDER — BACITRACIN ZINC 500 UNIT/GM EX OINT: TOPICAL_OINTMENT | Freq: Every day | CUTANEOUS | Status: DC

## 2015-10-08 MED ORDER — BUPIVACAINE HCL (PF) 0.5 % IJ SOLN
30.0000 mL | Freq: Once | INTRAMUSCULAR | Status: DC
  Filled 2015-10-08: qty 30

## 2015-10-08 MED ORDER — PENTAFLUOROPROP-TETRAFLUOROETH EX AERO
INHALATION_SPRAY | CUTANEOUS | Status: AC
  Filled 2015-10-08: qty 30

## 2015-10-08 MED ORDER — LIDOCAINE HCL (PF) 1 % IJ SOLN
5.0000 mL | Freq: Once | INTRAMUSCULAR | Status: DC
  Filled 2015-10-08: qty 5

## 2015-10-08 NOTE — Discharge Instructions (Signed)
Laceration Care, Adult  A laceration is a cut that goes through all layers of the skin. The cut also goes into the tissue that is right under the skin. Some cuts heal on their own. Others need to be closed with stitches (sutures), staples, skin adhesive strips, or wound glue. Taking care of your cut lowers your risk of infection and helps your cut to heal better.  HOW TO TAKE CARE OF YOUR CUT  For stitches or staples:  · Keep the wound clean and dry.  · If you were given a bandage (dressing), you should change it at least one time per day or as told by your doctor. You should also change it if it gets wet or dirty.  · Keep the wound completely dry for the first 24 hours or as told by your doctor. After that time, you may take a shower or a bath. However, make sure that the wound is not soaked in water until after the stitches or staples have been removed.  · Clean the wound one time each day or as told by your doctor:    Wash the wound with soap and water.    Rinse the wound with water until all of the soap comes off.    Pat the wound dry with a clean towel. Do not rub the wound.  · After you clean the wound, put a thin layer of antibiotic ointment on it as told by your doctor. This ointment:    Helps to prevent infection.    Keeps the bandage from sticking to the wound.  · Have your stitches or staples removed as told by your doctor.  If your doctor used skin adhesive strips:   · Keep the wound clean and dry.  · If you were given a bandage, you should change it at least one time per day or as told by your doctor. You should also change it if it gets dirty or wet.  · Do not get the skin adhesive strips wet. You can take a shower or a bath, but be careful to keep the wound dry.  · If the wound gets wet, pat it dry with a clean towel. Do not rub the wound.  · Skin adhesive strips fall off on their own. You can trim the strips as the wound heals. Do not remove any strips that are still stuck to the wound. They will  fall off after a while.  If your doctor used wound glue:  · Try to keep your wound dry, but you may briefly wet it in the shower or bath. Do not soak the wound in water, such as by swimming.  · After you take a shower or a bath, gently pat the wound dry with a clean towel. Do not rub the wound.  · Do not do any activities that will make you really sweaty until the skin glue has fallen off on its own.  · Do not apply liquid, cream, or ointment medicine to your wound while the skin glue is still on.  · If you were given a bandage, you should change it at least one time per day or as told by your doctor. You should also change it if it gets dirty or wet.  · If a bandage is placed over the wound, do not let the tape for the bandage touch the skin glue.  · Do not pick at the glue. The skin glue usually stays on for 5-10 days. Then, it   or when wound glue stays in place and the wound is healed. Make sure to wear a sunscreen of at least 30 SPF.  Take over-the-counter and prescription medicines only as told by your doctor.  If you were given antibiotic medicine or ointment, take or apply it as told by your doctor. Do not stop using the antibiotic even if your wound is getting better.  Do not scratch or pick at the wound.  Keep all follow-up visits as told by your doctor. This is important.  Check your wound every day for signs of infection. Watch for:  Redness, swelling, or pain.  Fluid, blood, or pus.  Raise (elevate) the injured area above the level of your heart while you are sitting or lying down, if possible. GET HELP IF:  You got a tetanus shot and you have any of these problems at the injection site:  Swelling.  Very bad pain.  Redness.  Bleeding.  You have a fever.  A wound that was  closed breaks open.  You notice a bad smell coming from your wound or your bandage.  You notice something coming out of the wound, such as wood or glass.  Medicine does not help your pain.  You have more redness, swelling, or pain at the site of your wound.  You have fluid, blood, or pus coming from your wound.  You notice a change in the color of your skin near your wound.  You need to change the bandage often because fluid, blood, or pus is coming from the wound.  You start to have a new rash.  You start to have numbness around the wound. GET HELP RIGHT AWAY IF:  You have very bad swelling around the wound.  Your pain suddenly gets worse and is very bad.  You notice painful lumps near the wound or on skin that is anywhere on your body.  You have a red streak going away from your wound.  The wound is on your hand or foot and you cannot move a finger or toe like you usually can.  The wound is on your hand or foot and you notice that your fingers or toes look pale or bluish.   This information is not intended to replace advice given to you by your health care provider. Make sure you discuss any questions you have with your health care provider.   Document Released: 02/11/2008 Document Revised: 01/09/2015 Document Reviewed: 08/21/2014 Elsevier Interactive Patient Education 2016 [employer]  Subungual Hematoma  A subungual hematoma is a pocket of blood under the fingernail or toenail. The nail may turn blue or feel painful. HOME CARE  Put ice on the injured area.  Put ice in a plastic bag.  Place a towel between your skin and the bag.  Leave the ice on for 15-20 minutes, 03-04 times a day. Do this for the first 1 to 2 days.  Raise (elevate) the injured area to lessen pain and puffiness (swelling).  If you were given a bandage, wear it for as long as told by your doctor.  If part of your nail falls off, trim the rest of the nail gently.  Only take medicines as  told by your doctor. GET HELP RIGHT AWAY IF:  You have redness or puffiness around the nail.  You have yellowish-white fluid (pus) coming from the nail.  Your pain does not get better with medicine.  You have a fever. MAKE SURE YOU:  Understand these instructions.  Will watch  your condition.  Will get help right away if you are not doing well or get worse.   This information is not intended to replace advice given to you by your health care provider. Make sure you discuss any questions you have with your health care provider.   Document Released: 11/17/2011 Document Reviewed: 01/10/2015 Elsevier Interactive Patient Education 2016 [place] the wound clean, dry, and covered with ointment and bandage. Follow-up with your provider in 1 week for suture removal.

## 2015-10-08 NOTE — ED Provider Notes (Signed)
[facility] Emergency Department Provider Note ____________________________________________  Time seen: 1558  I have reviewed the triage vital signs and the nursing notes.  HISTORY  Chief Complaint  Finger Injury  HPI Julie Holland is a 75 y.o. female existed ED for evaluation of injury to herright index finger after she accidentally slammed in the truck door this morning. She describes the accident occurred about 10 AM this morning. She notes bleeding was controlled, but continued oozing from the cuticle prompted her to report here for treatment. there appears to be in injury to the nailbed. She reports the discomfort is 7/10 in triage.  Past Medical History  Diagnosis Date  . Hyperlipidemia   . Hypertension   . Asthma   . Diabetes mellitus     type II  . Arthritis   . Hemorrhoids   . Chronic kidney disease     stage 1  . 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    Patient Active Problem List   Diagnosis Date Noted  . Asthma 08/17/2015  . 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 Umbilical hernia without obstruction and without gangrene 02/27/2015  . External hemorrhoids 02/27/2015  . 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 Encounter for screening colonoscopy 05/16/2013  . Routine general medical examination at a health care facility 03/15/2013  . 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 Shoulder pain 11/23/2012  . Right bundle branch block 11/11/2012  . Diabetes mellitus, controlled ([place])   . Hyperlipidemia   . Hypertension     Past Surgical History  Procedure Laterality Date  . Tubal ligation    . Total abdominal hysterectomy  01/1989  . Cholecystectomy    . Colonoscopy  02/13/13    Dr Bary Castilla  . Cataract extraction, bilateral    . Tonsillectomy  age 4  . Epigastric hernia repair N/A 03/20/2015    Procedure: HERNIA REPAIR EPIGASTRIC ADULT;  Surgeon: Robert Bellow, MD;   Location: [facility] ORS;  Service: General;  Laterality: N/A;  . Hemorrhoid surgery N/A 03/20/2015    Procedure: HEMORRHOIDECTOMY;  Surgeon: Robert Bellow, MD;  Location: [facility] ORS;  Service: General;  Laterality: N/A;  . Hernia repair  July 2016    Epigastric hernia, primary repair    Current Outpatient Rx  Name  Route  Sig  Dispense  Refill  . ACCU-CHEK SMARTVIEW test strip                 Dispense as written.   Marland Kitchen albuterol (PROVENTIL HFA;VENTOLIN HFA) 108 (90 BASE) MCG/ACT inhaler   Inhalation   Inhale 2 puffs into the lungs every 6 (six) hours as needed for wheezing.   6.7 g   11   . aspirin 81 MG tablet   Oral   Take 81 mg by mouth every morning.          . carvedilol (COREG) 6.25 MG tablet   Oral   Take 1 tablet (6.25 mg total) by mouth 2 (two) times daily.   180 tablet   4   . fluticasone (FLONASE) 50 MCG/ACT nasal spray   Each Nare   Place 1 spray into both nostrils daily.         . Fluticasone-Salmeterol (ADVAIR DISKUS) 100-50 MCG/DOSE AEPB   Inhalation   Inhale 1 puff into the lungs daily.   90 each   2   . lisinopril-hydrochlorothiazide (PRINZIDE,ZESTORETIC) 20-25 MG tablet  Take 1 tablet by mouth  daily after breakfast   90 tablet   3   . metFORMIN (GLUCOPHAGE) 850 MG tablet   Oral   Take 1 tablet (850 mg total) by mouth 2 (two) times daily with a meal.   180 tablet   3   . Multiple Vitamin (MULTIVITAMIN) tablet   Oral   Take 1 tablet by mouth daily.         . simvastatin (ZOCOR) 20 MG tablet      Take 1 tablet by mouth at  bedtime   90 tablet   1    Allergies Review of patient's allergies indicates no known allergies.  Family History  Problem Relation Age of Onset  . Colon cancer Father   . Lung cancer Brother   . Bladder Cancer Mother   . Lymphoma Sister    Social History Social History  Substance Use Topics  . Smoking status: Never Smoker   . Smokeless tobacco: Never Used  . Alcohol Use: No   Review of  Systems  Constitutional: Negative for fever. Eyes: Negative for visual changes. ENT: Negative for sore throat. Cardiovascular: Negative for chest pain. Respiratory: Negative for shortness of breath. Gastrointestinal: Negative for abdominal pain, vomiting and diarrhea. Genitourinary: Negative for dysuria. Musculoskeletal: Negative for back pain. Right index finger pain as above. Skin: Negative for rash. Neurological: Negative for headaches, focal weakness or numbness. ____________________________________________  PHYSICAL EXAM:  VITAL SIGNS: ED Triage Vitals  Enc Vitals Group     BP 10/08/15 1541 166/78 mmHg     Pulse Rate 10/08/15 1541 88     Resp 10/08/15 1541 18     Temp 10/08/15 1541 98.1 F (36.7 C)     Temp Source 10/08/15 1541 Oral     SpO2 10/08/15 1541 96 %     Weight 10/08/15 1541 124 lb (56.246 kg)     Height 10/08/15 1541 5\' 5"  (1.651 m)     Head Cir --      Peak Flow --      Pain Score 10/08/15 1542 7     Pain Loc --      Pain Edu? --      Excl. in [place]? --    Constitutional: Alert and oriented. Well appearing and in no distress. Head: Normocephalic and atraumatic.      Eyes: Conjunctivae are normal. PERRL. Normal extraocular movements      Ears: Canals clear. TMs intact bilaterally.   Nose: No congestion/rhinorrhea.   Mouth/Throat: Mucous membranes are moist.   Neck: Supple. No thyromegaly. Hematological/Lymphatic/Immunological: No cervical lymphadenopathy. Cardiovascular: Normal rate, regular rhythm.  Respiratory: Normal respiratory effort. No wheezes/rales/rhonchi. Gastrointestinal: Soft and nontender. No distention. Musculoskeletal: Right index finger with ulnar side laceration at the cuticle. There is also a small, proximal subungual hematoma noted. Normal DIP flexion and extension noted. Nontender with normal range of motion in all extremities.  Neurologic:  Normal gait without ataxia. Normal speech and language. No gross focal neurologic  deficits are appreciated. Skin:  Skin is warm, dry and intact. No rash noted. Psychiatric: Mood and affect are normal. Patient exhibits appropriate insight and judgment. ____________________________________________   RADIOLOGY  Right Index Finger IMPRESSION: No acute osseous injury of the right second digit.  I, Cyris Maalouf, Dannielle Karvonen, personally viewed and evaluated these images (plain radiographs) as part of my medical decision making, as well as reviewing the written report by the radiologist. ____________________________________________  PROCEDURES  LACERATION REPAIR Performed by: Olegario Shearer  Berniece Salines Authorized by: Melvenia Needles Consent: Verbal consent obtained. Risks and benefits: risks, benefits and alternatives were discussed Consent given by: patient Patient identity confirmed: provided demographic data Prepped and Draped in normal sterile fashion Wound explored  Laceration Location: right index cuticle  Laceration Length: 1 cm  No Foreign Bodies seen or palpated  Anesthesia: digital infiltration  Local anesthetic: 1:1 mixture of lidocaine 1% : marcaine 0.05%  Anesthetic total: 3 ml  Irrigation method: syringe Amount of cleaning: standard  Skin closure: 6-0 nylon  Number of sutures: 2  Technique: interrupted  Patient tolerance: Patient tolerated the procedure well with no immediate complications. ____________________________________________  INITIAL IMPRESSION / ASSESSMENT AND PLAN / ED COURSE  Simple suture repair of a cuticle laceration following crush injury to the finger. Small < 1/3 subungal hematoma noted proximally. Patient advised to have a follow up visit in 7-10 days for suture removal. Return to the ED if pain, erythema fever or bleeding. Wound care and dressing instructions are given. ____________________________________________  FINAL CLINICAL IMPRESSION(S) / ED DIAGNOSES  Final diagnoses:  Crushing injury of finger of  right hand, initial encounter  Laceration of finger of right hand, initial encounter  Hematoma, subungual, finger, right, initial encounter      Melvenia Needles, PA-C 10/08/15 1743  Delman Kitten, MD 10/09/15 0007

## 2015-10-08 NOTE — ED Notes (Signed)
Cought finger in car door approx 10am this morning, Right index finger.

## 2015-10-08 NOTE — ED Notes (Signed)
States she shut her right index finger in truck door   Small laceration noted under nail

## 2015-10-17 ENCOUNTER — Encounter: Payer: Self-pay | Admitting: Emergency Medicine

## 2015-10-17 ENCOUNTER — Emergency Department
Admission: EM | Admit: 2015-10-17 | Discharge: 2015-10-17 | Disposition: A | Discharge status: Home / Self Care | Payer: Medicare Other | Attending: Emergency Medicine | Admitting: Emergency Medicine

## 2015-10-17 DIAGNOSIS — Z7951 Long term (current) use of inhaled steroids: Secondary | ICD-10-CM | POA: Diagnosis not present

## 2015-10-17 DIAGNOSIS — E119 Type 2 diabetes mellitus without complications: Secondary | ICD-10-CM | POA: Insufficient documentation

## 2015-10-17 DIAGNOSIS — Z7984 Long term (current) use of oral hypoglycemic drugs: Secondary | ICD-10-CM | POA: Diagnosis not present

## 2015-10-17 DIAGNOSIS — Z79899 Other long term (current) drug therapy: Secondary | ICD-10-CM | POA: Insufficient documentation

## 2015-10-17 DIAGNOSIS — N181 Chronic kidney disease, stage 1: Secondary | ICD-10-CM | POA: Insufficient documentation

## 2015-10-17 DIAGNOSIS — I129 Hypertensive chronic kidney disease with stage 1 through stage 4 chronic kidney disease, or unspecified chronic kidney disease: Secondary | ICD-10-CM | POA: Diagnosis not present

## 2015-10-17 DIAGNOSIS — Z7982 Long term (current) use of aspirin: Secondary | ICD-10-CM | POA: Insufficient documentation

## 2015-10-17 DIAGNOSIS — Z4802 Encounter for removal of sutures: Secondary | ICD-10-CM | POA: Insufficient documentation

## 2015-10-17 NOTE — ED Notes (Signed)
Pt here for suture removal

## 2015-10-17 NOTE — ED Notes (Signed)
Here for suture removal to sutures to right index finger  Sutures in place swelling and bruising noted to tip of finger

## 2015-10-17 NOTE — Discharge Instructions (Signed)

## 2015-10-17 NOTE — ED Provider Notes (Signed)
[facility] Emergency Department Provider Note ____________________________________________  Time seen: Approximately 9:47 AM  I have reviewed the triage vital signs and the nursing notes.   HISTORY  Chief Complaint Suture / Staple Removal   HPI Julie Holland is a 75 y.o. female is here for suture removal from her right index finger. Patient still has swelling and bruising to the tip of her finger.She rates her pain as 2 out of 10.   Past Medical History  Diagnosis Date  . Hyperlipidemia   . Hypertension   . Asthma   . Diabetes mellitus     type II  . Arthritis   . Hemorrhoids   . Chronic kidney disease     stage 1  . 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    Patient Active Problem List   Diagnosis Date Noted  . Asthma 08/17/2015  . 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 Umbilical hernia without obstruction and without gangrene 02/27/2015  . External hemorrhoids 02/27/2015  . 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 Encounter for screening colonoscopy 05/16/2013  . Routine general medical examination at a health care facility 03/15/2013  . 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 Shoulder pain 11/23/2012  . Right bundle branch block 11/11/2012  . Diabetes mellitus, controlled ([place])   . Hyperlipidemia   . Hypertension     Past Surgical History  Procedure Laterality Date  . Tubal ligation    . Total abdominal hysterectomy  01/1989  . Cholecystectomy    . Colonoscopy  02/13/13    Dr Bary Castilla  . Cataract extraction, bilateral    . Tonsillectomy  age 30  . Epigastric hernia repair N/A 03/20/2015    Procedure: HERNIA REPAIR EPIGASTRIC ADULT;  Surgeon: Robert Bellow, MD;  Location: [facility] ORS;  Service: General;  Laterality: N/A;  . Hemorrhoid surgery N/A 03/20/2015    Procedure: HEMORRHOIDECTOMY;  Surgeon: Robert Bellow, MD;  Location: [facility] ORS;  Service: General;   Laterality: N/A;  . Hernia repair  July 2016    Epigastric hernia, primary repair    Current Outpatient Rx  Name  Route  Sig  Dispense  Refill  . ACCU-CHEK SMARTVIEW test strip                 Dispense as written.   Marland Kitchen albuterol (PROVENTIL HFA;VENTOLIN HFA) 108 (90 BASE) MCG/ACT inhaler   Inhalation   Inhale 2 puffs into the lungs every 6 (six) hours as needed for wheezing.   6.7 g   11   . aspirin 81 MG tablet   Oral   Take 81 mg by mouth every morning.          . carvedilol (COREG) 6.25 MG tablet   Oral   Take 1 tablet (6.25 mg total) by mouth 2 (two) times daily.   180 tablet   4   . fluticasone (FLONASE) 50 MCG/ACT nasal spray   Each Nare   Place 1 spray into both nostrils daily.         . Fluticasone-Salmeterol (ADVAIR DISKUS) 100-50 MCG/DOSE AEPB   Inhalation   Inhale 1 puff into the lungs daily.   90 each   2   . lisinopril-hydrochlorothiazide (PRINZIDE,ZESTORETIC) 20-25 MG tablet      Take 1 tablet by mouth  daily after breakfast   90 tablet   3   . metFORMIN (GLUCOPHAGE) 850 MG tablet  Oral   Take 1 tablet (850 mg total) by mouth 2 (two) times daily with a meal.   180 tablet   3   . Multiple Vitamin (MULTIVITAMIN) tablet   Oral   Take 1 tablet by mouth daily.         . simvastatin (ZOCOR) 20 MG tablet      Take 1 tablet by mouth at  bedtime   90 tablet   1     Allergies Review of patient's allergies indicates no known allergies.  Family History  Problem Relation Age of Onset  . Colon cancer Father   . Lung cancer Brother   . Bladder Cancer Mother   . Lymphoma Sister     Social History Social History  Substance Use Topics  . Smoking status: Never Smoker   . Smokeless tobacco: Never Used  . Alcohol Use: No    Review of Systems Constitutional: No fever/chills Genitourinary: Negative for dysuria. Musculoskeletal: Negative for back pain. Skin: Healing suture site   10-point ROS otherwise  negative.  ____________________________________________   PHYSICAL EXAM:  VITAL SIGNS: ED Triage Vitals  Enc Vitals Group     BP 10/17/15 0920 140/73 mmHg     Pulse Rate 10/17/15 0920 83     Resp 10/17/15 0920 20     Temp --      Temp src --      SpO2 10/17/15 0920 99 %     Weight 10/17/15 0920 124 lb (56.246 kg)     Height 10/17/15 0920 5\' 5"  (1.651 m)     Head Cir --      Peak Flow --      Pain Score 10/17/15 0920 2     Pain Loc --      Pain Edu? --      Excl. in [place]? --     Constitutional: Alert and oriented. Well appearing and in no acute distress. Eyes: Conjunctivae are normal. PERRL. EOMI. Head: Atraumatic. Nose: No congestion/rhinnorhea. Neck: No stridor.   Cardiovascular: Normal rate, regular rhythm. Grossly normal heart sounds.  Good peripheral circulation. Respiratory: Normal respiratory effort.  No retractions. Lungs CTAB. Neurologic:  Normal speech and language. No gait instability. Skin:  Skin is warm, dry. Healed suture site. Nail most likely will come off at sometime in the future.   ____________________________________________   LABS (all labs ordered are listed, but only abnormal results are displayed)  Labs Reviewed - No data to display  PROCEDURES  Procedure(s) performed: None  Critical Care performed: No  ____________________________________________   INITIAL IMPRESSION / ASSESSMENT AND PLAN / ED COURSE  Pertinent labs & imaging results that were available during my care of the patient were reviewed by me and considered in my medical decision making (see chart for details).  Patient sutures were removed by RN. ____________________________________________   FINAL CLINICAL IMPRESSION(S) / ED DIAGNOSES  Final diagnoses:  Encounter for removal of sutures      Johnn Hai, PA-C 10/17/15 [place], MD 10/17/15 [phone]

## 2015-10-30 ENCOUNTER — Telehealth: Payer: Self-pay | Admitting: Family Medicine

## 2015-10-30 MED ORDER — LISINOPRIL-HYDROCHLOROTHIAZIDE 20-25 MG PO TABS: 1.0000 | ORAL_TABLET | Freq: Every day | ORAL | Status: DC

## 2015-10-30 NOTE — Telephone Encounter (Signed)
Pt needs refill on Lisinopril to be called into [facility] 90 days supply

## 2015-10-30 NOTE — Telephone Encounter (Signed)
Medication has been refilled and sent to [facility] 

## 2015-11-15 ENCOUNTER — Ambulatory Visit: Payer: Medicare Other | Admitting: Family Medicine

## 2015-11-22 ENCOUNTER — Encounter: Payer: Self-pay | Admitting: Family Medicine

## 2015-11-22 ENCOUNTER — Ambulatory Visit (INDEPENDENT_AMBULATORY_CARE_PROVIDER_SITE_OTHER): Payer: Medicare Other | Admitting: Family Medicine

## 2015-11-22 VITALS — BP 133/68 | HR 91 | Temp 98.0°F | Resp 16 | Ht 65.0 in | Wt 119.0 lb

## 2015-11-22 DIAGNOSIS — E785 Hyperlipidemia, unspecified: Secondary | ICD-10-CM

## 2015-11-22 DIAGNOSIS — I10 Essential (primary) hypertension: Secondary | ICD-10-CM

## 2015-11-22 DIAGNOSIS — E119 Type 2 diabetes mellitus without complications: Secondary | ICD-10-CM

## 2015-11-22 LAB — GLUCOSE, POCT (MANUAL RESULT ENTRY): POC Glucose: 116 mg/dl — AB (ref 70–99)

## 2015-11-22 LAB — POCT GLYCOSYLATED HEMOGLOBIN (HGB A1C): Hemoglobin A1C: 6.4

## 2015-11-22 NOTE — Progress Notes (Signed)
Name: Julie Holland   MRN: [id number]    DOB: August 08, 1941   Date:11/22/2015       Progress Note  Subjective  Chief Complaint  Chief Complaint  Patient presents with  . Follow-up    3 mo    Diabetes She presents for her follow-up diabetic visit. She has type 2 diabetes mellitus. Her disease course has been stable. There are no hypoglycemic associated symptoms. Pertinent negatives for hypoglycemia include no headaches. Pertinent negatives for diabetes include no blurred vision and no chest pain. There are no diabetic complications. Pertinent negatives for diabetic complications include no CVA. Risk factors for coronary artery disease include dyslipidemia. Current diabetic treatment includes oral agent (monotherapy). Her weight is stable. She participates in exercise three times a week. Her breakfast blood glucose range is generally 110-130 mg/dl. An ACE inhibitor/angiotensin II receptor blocker is being taken.  Hyperlipidemia This is a chronic problem. The problem is controlled. Recent lipid tests were reviewed and are normal. Pertinent negatives include no chest pain or leg pain. Current antihyperlipidemic treatment includes statins, diet change and exercise. There are no compliance problems.   Hypertension This is a chronic problem. The problem is unchanged. The problem is controlled. Pertinent negatives include no blurred vision, chest pain, headaches or palpitations. Past treatments include ACE inhibitors, beta blockers and diuretics. There is no history of kidney disease, CAD/MI or CVA.      Past Medical History  Diagnosis Date  . Hyperlipidemia   . Hypertension   . Asthma   . Diabetes mellitus     type II  . Arthritis   . Hemorrhoids   . Chronic kidney disease     stage 1  . 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    Past Surgical History  Procedure Laterality Date  . Tubal ligation    . Total abdominal  hysterectomy  01/1989  . Cholecystectomy    . Colonoscopy  02/13/13    Dr Bary Castilla  . Cataract extraction, bilateral    . Tonsillectomy  age 62  . Epigastric hernia repair N/A 03/20/2015    Procedure: HERNIA REPAIR EPIGASTRIC ADULT;  Surgeon: Robert Bellow, MD;  Location: [facility] ORS;  Service: General;  Laterality: N/A;  . Hemorrhoid surgery N/A 03/20/2015    Procedure: HEMORRHOIDECTOMY;  Surgeon: Robert Bellow, MD;  Location: [facility] ORS;  Service: General;  Laterality: N/A;  . Hernia repair  July 2016    Epigastric hernia, primary repair    Family History  Problem Relation Age of Onset  . Colon cancer Father   . Lung cancer Brother   . Bladder Cancer Mother   . Lymphoma Sister     Social History   Social History  . Marital Status: Married    Spouse Name: N/A  . Number of Children: N/A  . Years of Education: N/A   Occupational History  . Not on file.   Social History Main Topics  . Smoking status: Never Smoker   . Smokeless tobacco: Never Used  . Alcohol Use: No  . Drug Use: No  . Sexual Activity: Yes    Birth Control/ Protection: Surgical   Other Topics Concern  . Not on file   Social History Narrative     Current outpatient prescriptions:  .  ACCU-CHEK SMARTVIEW test strip, , Disp: , Rfl:  .  albuterol (PROVENTIL HFA;VENTOLIN HFA) 108 (90 BASE) MCG/ACT inhaler, Inhale 2 puffs into the  lungs every 6 (six) hours as needed for wheezing., Disp: 6.7 g, Rfl: 11 .  aspirin 81 MG tablet, Take 81 mg by mouth every morning. , Disp: , Rfl:  .  carvedilol (COREG) 6.25 MG tablet, Take 1 tablet (6.25 mg total) by mouth 2 (two) times daily., Disp: 180 tablet, Rfl: 4 .  fluticasone (FLONASE) 50 MCG/ACT nasal spray, Place 1 spray into both nostrils daily., Disp: , Rfl:  .  Fluticasone-Salmeterol (ADVAIR DISKUS) 100-50 MCG/DOSE AEPB, Inhale 1 puff into the lungs daily., Disp: 90 each, Rfl: 2 .  lisinopril-hydrochlorothiazide (PRINZIDE,ZESTORETIC) 20-25 MG tablet, Take 1 tablet  by mouth daily., Disp: 90 tablet, Rfl: 0 .  metFORMIN (GLUCOPHAGE) 850 MG tablet, Take 1 tablet (850 mg total) by mouth 2 (two) times daily with a meal., Disp: 180 tablet, Rfl: 3 .  Multiple Vitamin (MULTIVITAMIN) tablet, Take 1 tablet by mouth daily., Disp: , Rfl:  .  simvastatin (ZOCOR) 20 MG tablet, Take 1 tablet by mouth at  bedtime, Disp: 90 tablet, Rfl: 1  No Known Allergies   Review of Systems  Eyes: Negative for blurred vision.  Cardiovascular: Negative for chest pain and palpitations.  Neurological: Negative for headaches.     Objective  Filed Vitals:   11/22/15 0959  BP: 133/68  Pulse: 91  Temp: 98 F (36.7 C)  TempSrc: Oral  Resp: 16  Height: 5\' 5"  (1.651 m)  Weight: 119 lb (53.978 kg)  SpO2: 97%    Physical Exam  Constitutional: She is oriented to person, place, and time and well-developed, well-nourished, and in no distress.  HENT:  Head: Normocephalic and atraumatic.  Cardiovascular: Normal rate and regular rhythm.   Pulmonary/Chest: Effort normal and breath sounds normal.  Abdominal: Soft. Bowel sounds are normal.  Neurological: She is alert and oriented to person, place, and time.  Nursing note and vitals reviewed.      Assessment & Plan  1. Essential hypertension Blood pressure well controlled and at goal on present therapy.  2. Hyperlipidemia Fasting lipid panel at goal and controlled.  3. Controlled type 2 diabetes mellitus without complication, without long-term current use of insulin (HCC) A1c 6.4%, very well controlled diabetes. No change in therapy. Recheck in 3-4 months   Melonie Germani [facility] 11/22/2015 10:13 AM

## 2015-12-18 ENCOUNTER — Other Ambulatory Visit: Payer: Self-pay | Admitting: Family Medicine

## 2016-01-09 ENCOUNTER — Other Ambulatory Visit: Payer: Self-pay | Admitting: Family Medicine

## 2016-01-23 ENCOUNTER — Other Ambulatory Visit: Payer: Self-pay | Admitting: Family Medicine

## 2016-01-23 NOTE — Telephone Encounter (Signed)
Medication has been refilled and sent to [facility] 

## 2016-02-21 ENCOUNTER — Ambulatory Visit (INDEPENDENT_AMBULATORY_CARE_PROVIDER_SITE_OTHER): Payer: Medicare Other | Admitting: Family Medicine

## 2016-02-21 ENCOUNTER — Encounter: Payer: Self-pay | Admitting: Family Medicine

## 2016-02-21 ENCOUNTER — Other Ambulatory Visit: Payer: Self-pay | Admitting: Family Medicine

## 2016-02-21 VITALS — BP 127/69 | HR 93 | Temp 98.4°F | Resp 17 | Ht 65.0 in | Wt 118.1 lb

## 2016-02-21 DIAGNOSIS — E785 Hyperlipidemia, unspecified: Secondary | ICD-10-CM

## 2016-02-21 DIAGNOSIS — E119 Type 2 diabetes mellitus without complications: Secondary | ICD-10-CM | POA: Diagnosis not present

## 2016-02-21 DIAGNOSIS — I10 Essential (primary) hypertension: Secondary | ICD-10-CM | POA: Diagnosis not present

## 2016-02-21 LAB — POCT GLYCOSYLATED HEMOGLOBIN (HGB A1C): Hemoglobin A1C: 6.8

## 2016-02-21 LAB — GLUCOSE, POCT (MANUAL RESULT ENTRY): POC Glucose: 136 mg/dl — AB (ref 70–99)

## 2016-02-21 MED ORDER — CARVEDILOL 6.25 MG PO TABS: 6.2500 mg | ORAL_TABLET | Freq: Two times a day (BID) | ORAL | Status: DC

## 2016-02-21 NOTE — Progress Notes (Signed)
Name: Julie Holland   MRN: [id number]    DOB: 11-13-1940   Date:02/21/2016       Progress Note  Subjective  Chief Complaint  Chief Complaint  Patient presents with  . Follow-up    3 mo  . Medication Refill    carvedilol 6.25 mg     Diabetes She presents for her follow-up diabetic visit. She has type 2 diabetes mellitus. Her disease course has been stable. Pertinent negatives for hypoglycemia include no headaches. Pertinent negatives for diabetes include no blurred vision, no chest pain, no fatigue, no polydipsia and no polyuria. There are no diabetic complications. Pertinent negatives for diabetic complications include no CVA. She is following a diabetic diet. Her breakfast blood glucose range is generally 110-130 mg/dl. An ACE inhibitor/angiotensin II receptor blocker is being taken.  Hypertension This is a chronic problem. The problem is unchanged. The problem is controlled. Pertinent negatives include no blurred vision, chest pain, headaches, palpitations or shortness of breath. Past treatments include ACE inhibitors, diuretics and beta blockers. There is no history of kidney disease, CAD/MI or CVA.  Hyperlipidemia This is a chronic problem. The problem is controlled. Recent lipid tests were reviewed and are normal. Pertinent negatives include no chest pain, myalgias or shortness of breath. Current antihyperlipidemic treatment includes statins.     Past Medical History  Diagnosis Date  . Hyperlipidemia   . Hypertension   . Asthma   . Diabetes mellitus     type II  . Arthritis   . Hemorrhoids   . Chronic kidney disease     stage 1  . 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    Past Surgical History  Procedure Laterality Date  . Tubal ligation    . Total abdominal hysterectomy  01/1989  . Cholecystectomy    . Colonoscopy  02/13/13    Dr Bary Castilla  . Cataract extraction, bilateral    . Tonsillectomy  age 75  .  Epigastric hernia repair N/A 03/20/2015    Procedure: HERNIA REPAIR EPIGASTRIC ADULT;  Surgeon: Robert Bellow, MD;  Location: [facility] ORS;  Service: General;  Laterality: N/A;  . Hemorrhoid surgery N/A 03/20/2015    Procedure: HEMORRHOIDECTOMY;  Surgeon: Robert Bellow, MD;  Location: [facility] ORS;  Service: General;  Laterality: N/A;  . Hernia repair  July 2016    Epigastric hernia, primary repair    Family History  Problem Relation Age of Onset  . Colon cancer Father   . Lung cancer Brother   . Bladder Cancer Mother   . Lymphoma Sister     Social History   Social History  . Marital Status: Married    Spouse Name: N/A  . Number of Children: N/A  . Years of Education: N/A   Occupational History  . Not on file.   Social History Main Topics  . Smoking status: Never Smoker   . Smokeless tobacco: Never Used  . Alcohol Use: No  . Drug Use: No  . Sexual Activity: Yes    Birth Control/ Protection: Surgical   Other Topics Concern  . Not on file   Social History Narrative     Current outpatient prescriptions:  .  ACCU-CHEK SMARTVIEW test strip, , Disp: , Rfl:  .  albuterol (PROVENTIL HFA;VENTOLIN HFA) 108 (90 BASE) MCG/ACT inhaler, Inhale 2 puffs into the lungs every 6 (six) hours as needed for wheezing., Disp: 6.7 g, Rfl: 11 .  aspirin 81 MG tablet, Take 81 mg by mouth every morning. , Disp: , Rfl:  .  carvedilol (COREG) 6.25 MG tablet, Take 1 tablet (6.25 mg total) by mouth 2 (two) times daily., Disp: 180 tablet, Rfl: 4 .  fluticasone (FLONASE) 50 MCG/ACT nasal spray, Place 1 spray into both nostrils daily., Disp: , Rfl:  .  lisinopril-hydrochlorothiazide (PRINZIDE,ZESTORETIC) 20-25 MG tablet, Take 1 tablet by mouth  daily, Disp: 90 tablet, Rfl: 1 .  metFORMIN (GLUCOPHAGE) 850 MG tablet, Take 1 tablet by mouth two  times daily with meals, Disp: 180 tablet, Rfl: 2 .  Multiple Vitamin (MULTIVITAMIN) tablet, Take 1 tablet by mouth daily., Disp: , Rfl:  .  simvastatin (ZOCOR) 20  MG tablet, Take 1 tablet (20 mg total) by mouth at bedtime., Disp: 90 tablet, Rfl: 0 .  Fluticasone-Salmeterol (ADVAIR DISKUS) 100-50 MCG/DOSE AEPB, Inhale 1 puff into the lungs daily. (Patient not taking: Reported on 02/21/2016), Disp: 90 each, Rfl: 2  No Known Allergies   Review of Systems  Constitutional: Negative for fatigue.  Eyes: Negative for blurred vision.  Respiratory: Negative for shortness of breath.   Cardiovascular: Negative for chest pain and palpitations.  Musculoskeletal: Negative for myalgias.  Neurological: Negative for headaches.  Endo/Heme/Allergies: Negative for polydipsia.    Objective  Filed Vitals:   02/21/16 0845  BP: 127/69  Pulse: 93  Temp: 98.4 F (36.9 C)  TempSrc: Oral  Resp: 17  Height: 5\' 5"  (1.651 m)  Weight: 118 lb 1.6 oz (53.57 kg)  SpO2: 95%    Physical Exam  Constitutional: She is oriented to person, place, and time and well-developed, well-nourished, and in no distress.  HENT:  Head: Normocephalic and atraumatic.  Cardiovascular: Normal rate, regular rhythm and normal heart sounds.   No murmur heard. Pulmonary/Chest: Effort normal and breath sounds normal. She has no wheezes.  Abdominal: Soft. Bowel sounds are normal. There is no tenderness.  Musculoskeletal: She exhibits no edema.  Neurological: She is alert and oriented to person, place, and time.  Nursing note and vitals reviewed.      Assessment & Plan  1. Controlled type 2 diabetes mellitus without complication, without long-term current use of insulin (HCC) A1c is well controlled, obtain urine microalbumin/creatinine ratio, continue on present therapy for diabetes - POCT Glucose (CBG) - POCT HgB A1C - Urine Microalbumin w/creat. ratio  2. Essential hypertension Blood pressure is stable and controlled on present therapy - carvedilol (COREG) 6.25 MG tablet; Take 1 tablet (6.25 mg total) by mouth 2 (two) times daily with a meal.  Dispense: 180 tablet; Refill: 0  3.  Hyperlipidemia FLP at goal, rechecked today. - Lipid Profile - Comprehensive Metabolic Panel (CMET)   Videl Nobrega [facility] 02/21/2016 8:56 AM

## 2016-02-22 LAB — LIPID PANEL
Cholesterol: 122 mg/dL — ABNORMAL LOW (ref 125–200)
HDL: 51 mg/dL (ref >=46)
LDL Cholesterol: 57 mg/dL (ref <130)
Total CHOL/HDL Ratio: 2.4 Ratio (ref <=5.0)
Triglycerides: 71 mg/dL (ref <150)
VLDL: 14 mg/dL (ref <30)

## 2016-02-22 LAB — COMPREHENSIVE METABOLIC PANEL
ALT: 20 U/L (ref 6–29)
AST: 25 U/L (ref 10–35)
Albumin: 4.2 g/dL (ref 3.6–5.1)
Alkaline Phosphatase: 71 U/L (ref 33–130)
BUN: 22 mg/dL (ref 7–25)
CO2: 28 mmol/L (ref 20–31)
Calcium: 9.8 mg/dL (ref 8.6–10.4)
Chloride: 99 mmol/L (ref 98–110)
Creat: 0.57 mg/dL — ABNORMAL LOW (ref 0.60–0.93)
Glucose, Bld: 131 mg/dL — ABNORMAL HIGH (ref 65–99)
Potassium: 3.8 mmol/L (ref 3.5–5.3)
Sodium: 136 mmol/L (ref 135–146)
Total Bilirubin: 0.5 mg/dL (ref 0.2–1.2)
Total Protein: 7.5 g/dL (ref 6.1–8.1)

## 2016-03-29 ENCOUNTER — Other Ambulatory Visit: Payer: Self-pay | Admitting: Family Medicine

## 2016-04-14 ENCOUNTER — Other Ambulatory Visit: Payer: Self-pay | Admitting: Family Medicine

## 2016-04-14 DIAGNOSIS — I10 Essential (primary) hypertension: Secondary | ICD-10-CM

## 2016-05-23 ENCOUNTER — Ambulatory Visit: Payer: Medicare Other | Admitting: Family Medicine

## 2016-05-27 ENCOUNTER — Ambulatory Visit (INDEPENDENT_AMBULATORY_CARE_PROVIDER_SITE_OTHER): Payer: Medicare Other | Admitting: Family Medicine

## 2016-05-27 ENCOUNTER — Encounter: Payer: Self-pay | Admitting: Family Medicine

## 2016-05-27 VITALS — BP 117/79 | HR 94 | Temp 98.5°F | Resp 16 | Ht 65.0 in | Wt 120.4 lb

## 2016-05-27 DIAGNOSIS — Z23 Encounter for immunization: Secondary | ICD-10-CM | POA: Diagnosis not present

## 2016-05-27 DIAGNOSIS — I10 Essential (primary) hypertension: Secondary | ICD-10-CM

## 2016-05-27 DIAGNOSIS — E119 Type 2 diabetes mellitus without complications: Secondary | ICD-10-CM

## 2016-05-27 DIAGNOSIS — E785 Hyperlipidemia, unspecified: Secondary | ICD-10-CM

## 2016-05-27 LAB — POCT GLYCOSYLATED HEMOGLOBIN (HGB A1C): Hemoglobin A1C: 7.1

## 2016-05-27 LAB — GLUCOSE, POCT (MANUAL RESULT ENTRY): POC Glucose: 154 mg/dl — AB (ref 70–99)

## 2016-05-27 MED ORDER — SIMVASTATIN 20 MG PO TABS: 20.0000 mg | ORAL_TABLET | Freq: Every day | ORAL | 0 refills | Status: DC

## 2016-05-27 NOTE — Progress Notes (Signed)
Name: Julie Holland   MRN: [id number]    DOB: 29-Apr-1941   Date:05/27/2016       Progress Note  Subjective  Chief Complaint  Chief Complaint  Patient presents with  . Follow-up    3 mo  . Medication Refill    smartview test strips / simvastatin    Diabetes  She presents for her follow-up diabetic visit. She has type 2 diabetes mellitus. Her disease course has been stable. Pertinent negatives for hypoglycemia include no headaches. Associated symptoms include fatigue. Pertinent negatives for diabetes include no blurred vision, no chest pain, no foot paresthesias, no polydipsia and no polyuria. Symptoms are stable. There are no diabetic complications. Pertinent negatives for diabetic complications include no CVA or heart disease. She is following a diabetic diet. Her breakfast blood glucose range is generally 110-130 mg/dl. Her dinner blood glucose range is generally 130-140 mg/dl. An ACE inhibitor/angiotensin II receptor blocker is being taken. Eye exam is current.  Hypertension  This is a chronic problem. The problem is unchanged. The problem is controlled. Pertinent negatives include no blurred vision, chest pain, headaches, palpitations or shortness of breath. Past treatments include ACE inhibitors, diuretics and beta blockers. There is no history of kidney disease, CAD/MI or CVA.  Hyperlipidemia  This is a chronic problem. The problem is controlled. Recent lipid tests were reviewed and are normal. Pertinent negatives include no chest pain, myalgias or shortness of breath. Current antihyperlipidemic treatment includes statins.    Past Medical History:  Diagnosis Date  . Arthritis   . Asthma   . 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  . Chronic kidney disease    stage 1  . Diabetes mellitus    type II  . Hemorrhoids   . Hyperlipidemia   . Hypertension     Past Surgical History:  Procedure Laterality Date  .  CATARACT EXTRACTION, BILATERAL    . CHOLECYSTECTOMY    . COLONOSCOPY  02/13/13   Dr Bary Castilla  . EPIGASTRIC HERNIA REPAIR N/A 03/20/2015   Procedure: HERNIA REPAIR EPIGASTRIC ADULT;  Surgeon: Robert Bellow, MD;  Location: [facility] ORS;  Service: General;  Laterality: N/A;  . HEMORRHOID SURGERY N/A 03/20/2015   Procedure: HEMORRHOIDECTOMY;  Surgeon: Robert Bellow, MD;  Location: [facility] ORS;  Service: General;  Laterality: N/A;  . HERNIA REPAIR  July 2016   Epigastric hernia, primary repair  . TONSILLECTOMY  age 67  . TOTAL ABDOMINAL HYSTERECTOMY  01/1989  . TUBAL LIGATION      Family History  Problem Relation Age of Onset  . Bladder Cancer Mother   . Colon cancer Father   . Lung cancer Brother   . Lymphoma Sister     Social History   Social History  . Marital status: Married    Spouse name: N/A  . Number of children: N/A  . Years of education: N/A   Occupational History  . Not on file.   Social History Main Topics  . Smoking status: Never Smoker  . Smokeless tobacco: Never Used  . Alcohol use No  . Drug use: No  . Sexual activity: Yes    Birth control/ protection: Surgical   Other Topics Concern  . Not on file   Social History Narrative  . No narrative on file     Current Outpatient Prescriptions:  .  ACCU-CHEK SMARTVIEW test strip, , Disp: , Rfl:  .  albuterol (PROVENTIL HFA;VENTOLIN HFA) 108 (  90 BASE) MCG/ACT inhaler, Inhale 2 puffs into the lungs every 6 (six) hours as needed for wheezing., Disp: 6.7 g, Rfl: 11 .  aspirin 81 MG tablet, Take 81 mg by mouth every morning. , Disp: , Rfl:  .  carvedilol (COREG) 6.25 MG tablet, Take 1 tablet by mouth two  times daily with a meal, Disp: 180 tablet, Rfl: 0 .  fluticasone (FLONASE) 50 MCG/ACT nasal spray, Place 1 spray into both nostrils daily., Disp: , Rfl:  .  Fluticasone-Salmeterol (ADVAIR DISKUS) 100-50 MCG/DOSE AEPB, Inhale 1 puff into the lungs daily., Disp: 90 each, Rfl: 2 .  lisinopril-hydrochlorothiazide  (PRINZIDE,ZESTORETIC) 20-25 MG tablet, Take 1 tablet by mouth  daily, Disp: 90 tablet, Rfl: 0 .  metFORMIN (GLUCOPHAGE) 850 MG tablet, Take 1 tablet by mouth two  times daily with meals, Disp: 180 tablet, Rfl: 2 .  Multiple Vitamin (MULTIVITAMIN) tablet, Take 1 tablet by mouth daily., Disp: , Rfl:  .  simvastatin (ZOCOR) 20 MG tablet, Take 1 tablet by mouth at  bedtime, Disp: 90 tablet, Rfl: 0  No Known Allergies   Review of Systems  Constitutional: Positive for fatigue.  Eyes: Negative for blurred vision.  Respiratory: Negative for shortness of breath.   Cardiovascular: Negative for chest pain and palpitations.  Musculoskeletal: Negative for myalgias.  Neurological: Negative for headaches.  Endo/Heme/Allergies: Negative for polydipsia.    Objective  Vitals:   05/27/16 0945  BP: 117/79  Pulse: 94  Resp: 16  Temp: 98.5 F (36.9 C)  TempSrc: Oral  SpO2: 97%  Weight: 120 lb 6.4 oz (54.6 kg)  Height: 5\' 5"  (1.651 m)    Physical Exam  Constitutional: She is oriented to person, place, and time and well-developed, well-nourished, and in no distress.  HENT:  Head: Normocephalic and atraumatic.  Cardiovascular: Normal rate, regular rhythm and normal heart sounds.   No murmur heard. Pulmonary/Chest: Effort normal and breath sounds normal. She has no wheezes.  Abdominal: Soft. Bowel sounds are normal. There is no tenderness.  Musculoskeletal: She exhibits no edema.       Right ankle: She exhibits no swelling.       Left ankle: She exhibits no swelling.  Neurological: She is alert and oriented to person, place, and time.  Psychiatric: Mood, memory, affect and judgment normal.  Nursing note and vitals reviewed.   Assessment & Plan  1. Controlled type 2 diabetes mellitus without complication, without long-term current use of insulin (HCC)  A1c 7.1%, at goal. No change in pharmacotherapy.UA microalbumin deferred because patient could not void - POCT UA - Microalbumin - POCT HgB  A1C - POCT Glucose (CBG)  2. Essential hypertension BP stable and controlled on present antihypertensive therapy  3. Hyperlipidemia Obtain FLP ordered initially in June, continue on statin therapy. - simvastatin (ZOCOR) 20 MG tablet; Take 1 tablet (20 mg total) by mouth at bedtime.  Dispense: 90 tablet; Refill: 0     Edouard Gikas [facility] 05/27/2016 10:12 AM

## 2016-06-25 ENCOUNTER — Encounter: Payer: Self-pay | Admitting: General Surgery

## 2016-06-25 ENCOUNTER — Ambulatory Visit (INDEPENDENT_AMBULATORY_CARE_PROVIDER_SITE_OTHER): Payer: Medicare Other | Admitting: General Surgery

## 2016-06-25 VITALS — BP 160/82 | HR 88 | Resp 12 | Ht 65.0 in | Wt 121.0 lb

## 2016-06-25 DIAGNOSIS — K6289 Other specified diseases of anus and rectum: Secondary | ICD-10-CM | POA: Insufficient documentation

## 2016-06-25 DIAGNOSIS — D013 Carcinoma in situ of anus and anal canal: Secondary | ICD-10-CM | POA: Diagnosis not present

## 2016-06-25 DIAGNOSIS — K644 Residual hemorrhoidal skin tags: Secondary | ICD-10-CM | POA: Diagnosis not present

## 2016-06-25 DIAGNOSIS — K629 Disease of anus and rectum, unspecified: Secondary | ICD-10-CM | POA: Diagnosis not present

## 2016-06-25 DIAGNOSIS — K648 Other hemorrhoids: Secondary | ICD-10-CM

## 2016-06-25 LAB — POC HEMOCCULT BLD/STL (OFFICE/1-CARD/DIAGNOSTIC): Fecal Occult Blood, POC: NEGATIVE

## 2016-06-25 NOTE — Patient Instructions (Addendum)
Recommend cortisone cream to irritated facial areas. Recommend excision under same day surgery

## 2016-06-25 NOTE — Progress Notes (Addendum)
Patient ID: Julie Holland, female   DOB: 11-Jul-1941, 75 y.o.   MRN: [id number]  Chief Complaint  Patient presents with  . Follow-up    hemorrhoids and umbilical hernia     HPI Julie Holland is a 75 y.o. female here today for her one year follow up umbilical hernia and hemorrhoids. Patient states she occasionally has rectal bleeding maybe once a month if she has hard BM. She makes sure she eats lots of fiber in her diet. Denies any rectal or abdominal pain.  The patient reports that she did have a GYN examination within the last year with Dr. Ammie Dalton. 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 The patient reports no pathology was identified.  She does feel her allergies have been bothering her lately. She has red irritation at her temporal area each side and has been using gold bond.  HPI  Past Medical History:  Diagnosis Date  . Arthritis   . Asthma   . 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  . Chronic kidney disease    stage 1  . Diabetes mellitus    type II  . Hemorrhoids   . Hyperlipidemia   . Hypertension     Past Surgical History:  Procedure Laterality Date  . CATARACT EXTRACTION, BILATERAL    . CHOLECYSTECTOMY    . COLONOSCOPY  02/13/13   Dr Bary Castilla  . EPIGASTRIC HERNIA REPAIR N/A 03/20/2015   Procedure: HERNIA REPAIR EPIGASTRIC ADULT;  Surgeon: Robert Bellow, MD;  Location: [facility] ORS;  Service: General;  Laterality: N/A;  . HEMORRHOID SURGERY N/A 03/20/2015    FOCAL HIGH-GRADE SQUAMOUS INTRAEPITHELIAL LESION (HSIL, ANAL /HEMORRHOIDECTOMY;   Robert Bellow, MD [facility] ORS;  : General;  Laterality: N/A;  . HERNIA REPAIR  July 2016   Epigastric hernia, primary repair  . TONSILLECTOMY  age 1  . TOTAL ABDOMINAL HYSTERECTOMY  01/1989  . TUBAL LIGATION      Family History  Problem Relation Age of Onset  . Bladder Cancer Mother   . Colon cancer Father   .  Lung cancer Brother   . Lymphoma Sister     Social History Social History  Substance Use Topics  . Smoking status: Never Smoker  . Smokeless tobacco: Never Used  . Alcohol use No    No Known Allergies  Current Outpatient Prescriptions  Medication Sig Dispense Refill  . ACCU-CHEK SMARTVIEW test strip     . albuterol (PROVENTIL HFA;VENTOLIN HFA) 108 (90 BASE) MCG/ACT inhaler Inhale 2 puffs into the lungs every 6 (six) hours as needed for wheezing. 6.7 g 11  . aspirin 81 MG tablet Take 81 mg by mouth every morning.     . carvedilol (COREG) 6.25 MG tablet Take 1 tablet by mouth two  times daily with a meal 180 tablet 0  . fluticasone (FLONASE) 50 MCG/ACT nasal spray Place 1 spray into both nostrils daily.    . Fluticasone-Salmeterol (ADVAIR DISKUS) 100-50 MCG/DOSE AEPB Inhale 1 puff into the lungs daily. 90 each 2  . lisinopril-hydrochlorothiazide (PRINZIDE,ZESTORETIC) 20-25 MG tablet Take 1 tablet by mouth  daily 90 tablet 0  . metFORMIN (GLUCOPHAGE) 850 MG tablet Take 1 tablet by mouth two  times daily with meals 180 tablet 2  . Multiple Vitamin (MULTIVITAMIN) tablet Take 1 tablet by mouth daily.    . simvastatin (ZOCOR) 20 MG tablet Take 1 tablet (20 mg total)  by mouth at bedtime. 90 tablet 0   No current facility-administered medications for this visit.     Review of Systems Review of Systems  Constitutional: Negative.   Respiratory: Negative.   Cardiovascular: Negative.     Blood pressure (!) 160/82, pulse 88, resp. rate 12, height 5\' 5"  (1.651 m), weight 121 lb (54.9 kg).  Physical Exam Physical Exam  Constitutional: She is oriented to person, place, and time. She appears well-developed and well-nourished.  HENT:  Head:    Cardiovascular: Normal rate and regular rhythm.   Pulmonary/Chest: Effort normal and breath sounds normal.  Abdominal: Soft. Normal appearance and bowel sounds are normal. There is no tenderness.  Genitourinary: Rectal exam shows guaiac negative  stool.     Neurological: She is alert and oriented to person, place, and time.  Skin: Skin is warm and dry.  Contact dermatitis bilateral temporal areas  Psychiatric: Her behavior is normal.    Data Reviewed March 20, 2015 posterior hemorrhoidal tissue: - FOCAL HIGH-GRADE SQUAMOUS INTRAEPITHELIAL LESION (HSIL, ANAL  INTRAEPITHELIAL NEOPLASIA 3) WITHIN A HEMORRHOID SURFACED MOSTLY BY  COLUMNAR MUCOSA.     Assessment    Focal abnormality on the anterior rectal wall, past history high-grade intraepithelial lesion.    Plan         Recommend excision of rectal mass.  Patient's surgery has been scheduled for 07-18-16 at [facility]. It is okay for patient to continue 81 mg aspirin once daily.   Recommend cortisone cream to irritated facial areas..  This information has been scribed by Karie Fetch RN, BSN,BC.   Robert Bellow 06/27/2016, 7:02 AM

## 2016-06-27 DIAGNOSIS — D013 Carcinoma in situ of anus and anal canal: Secondary | ICD-10-CM | POA: Insufficient documentation

## 2016-07-01 NOTE — Addendum Note (Signed)
Addended by: Robert Bellow on: 07/01/2016 09:32 AM   Modules accepted: Orders, SmartSet

## 2016-07-09 ENCOUNTER — Inpatient Hospital Stay: Admission: RE | Admit: 2016-07-09 | Payer: Medicare Other | Source: Ambulatory Visit

## 2016-07-10 ENCOUNTER — Inpatient Hospital Stay: Admission: RE | Admit: 2016-07-10 | Payer: Medicare Other | Source: Ambulatory Visit

## 2016-07-11 ENCOUNTER — Inpatient Hospital Stay: Admission: RE | Admit: 2016-07-11 | Payer: Medicare Other | Source: Ambulatory Visit

## 2016-07-14 ENCOUNTER — Encounter
Admission: RE | Admit: 2016-07-14 | Discharge: 2016-07-14 | Disposition: A | Discharge status: Home / Self Care | Payer: Medicare Other | Source: Ambulatory Visit | Attending: General Surgery | Admitting: General Surgery

## 2016-07-14 HISTORY — DX: Gastro-esophageal reflux disease without esophagitis: K21.9

## 2016-07-14 NOTE — Patient Instructions (Addendum)
  Your procedure is scheduled on: 07-18-16 (FRIDAY) Report to Same Day Surgery 2nd floor medical mall To find out your arrival time please call [phone] between [place] on 07-17-16 (THURSDAY)  Remember: Instructions that are not followed completely may result in serious medical risk, up to and including death, or upon the discretion of your surgeon and anesthesiologist your surgery may need to be rescheduled.    _x___ 1. Do not eat food or drink liquids after midnight. No gum chewing or hard candies.     __x__ 2. No Alcohol for 24 hours before or after surgery.   __x__3. No Smoking for 24 prior to surgery.   ____  4. Bring all medications with you on the day of surgery if instructed.    __x__ 5. Notify your doctor if there is any change in your medical condition     (cold, fever, infections).     Do not wear jewelry, make-up, hairpins, clips or nail polish.  Do not wear lotions, powders, or perfumes. You may wear deodorant.  Do not shave 48 hours prior to surgery. Men may shave face and neck.  Do not bring valuables to the hospital.    [facility] is not responsible for any belongings or valuables.               Contacts, dentures or bridgework may not be worn into surgery.  Leave your suitcase in the car. After surgery it may be brought to your room.  For patients admitted to the hospital, discharge time is determined by your treatment team.   Patients discharged the day of surgery will not be allowed to drive home.    Please read over the following fact sheets that you were given:   [facility] Preparing for Surgery and or MRSA Information   _x___ Take these medicines the morning of surgery with A SIP OF WATER:    1. CARVEDILOL (COREG)  2.  3.  4.  5.  6.  ____Fleets enema or Magnesium Citrate as directed.   ____ Use CHG Soap or sage wipes as directed on instruction sheet   _X___ Use inhalers on the day of surgery and bring to hospital day of surgery-USE ADVAIR  AND ALBUTEROL INHALER AT [place]  _X___ Stop metformin 2 days prior to surgery-LAST DOSE Tuesday, 07-15-16    ____ Take 1/2 of usual insulin dose the night before surgery and none on the morning of  surgery.   ____ Stop aspirin or coumadin, or plavix-OK TO CONTINUE 81 MG ASPIRIN-DO NOT TAKE AM OF SURGERY  __ Stop Anti-inflammatories such as Advil, Aleve, Ibuprofen, Motrin, Naproxen,          Naprosyn, Goodies powders or aspirin products. Ok to take Tylenol.   ____ Stop supplements until after surgery.    ____ Bring C-Pap to the hospital.

## 2016-07-15 ENCOUNTER — Encounter
Admission: RE | Admit: 2016-07-15 | Discharge: 2016-07-15 | Disposition: A | Discharge status: Home / Self Care | Payer: Medicare Other | Source: Ambulatory Visit | Attending: General Surgery | Admitting: General Surgery

## 2016-07-15 ENCOUNTER — Other Ambulatory Visit: Payer: Self-pay | Admitting: Family Medicine

## 2016-07-15 DIAGNOSIS — E1122 Type 2 diabetes mellitus with diabetic chronic kidney disease: Secondary | ICD-10-CM | POA: Diagnosis not present

## 2016-07-15 DIAGNOSIS — E785 Hyperlipidemia, unspecified: Secondary | ICD-10-CM

## 2016-07-15 DIAGNOSIS — K6289 Other specified diseases of anus and rectum: Secondary | ICD-10-CM | POA: Diagnosis present

## 2016-07-15 DIAGNOSIS — J45909 Unspecified asthma, uncomplicated: Secondary | ICD-10-CM | POA: Diagnosis not present

## 2016-07-15 DIAGNOSIS — Z7951 Long term (current) use of inhaled steroids: Secondary | ICD-10-CM | POA: Diagnosis not present

## 2016-07-15 DIAGNOSIS — C211 Malignant neoplasm of anal canal: Secondary | ICD-10-CM | POA: Diagnosis not present

## 2016-07-15 DIAGNOSIS — Z7984 Long term (current) use of oral hypoglycemic drugs: Secondary | ICD-10-CM | POA: Diagnosis not present

## 2016-07-15 DIAGNOSIS — I129 Hypertensive chronic kidney disease with stage 1 through stage 4 chronic kidney disease, or unspecified chronic kidney disease: Secondary | ICD-10-CM | POA: Diagnosis not present

## 2016-07-15 DIAGNOSIS — I10 Essential (primary) hypertension: Secondary | ICD-10-CM | POA: Diagnosis not present

## 2016-07-15 DIAGNOSIS — M1990 Unspecified osteoarthritis, unspecified site: Secondary | ICD-10-CM | POA: Diagnosis not present

## 2016-07-15 DIAGNOSIS — N189 Chronic kidney disease, unspecified: Secondary | ICD-10-CM | POA: Diagnosis not present

## 2016-07-15 DIAGNOSIS — Z79899 Other long term (current) drug therapy: Secondary | ICD-10-CM | POA: Diagnosis not present

## 2016-07-15 DIAGNOSIS — Z7982 Long term (current) use of aspirin: Secondary | ICD-10-CM | POA: Diagnosis not present

## 2016-07-15 LAB — BASIC METABOLIC PANEL
Anion gap: 7 (ref 5–15)
BUN: 13 mg/dL (ref 6–20)
CO2: 32 mmol/L (ref 22–32)
Calcium: 9.5 mg/dL (ref 8.9–10.3)
Chloride: 96 mmol/L — ABNORMAL LOW (ref 101–111)
Creatinine, Ser: 0.66 mg/dL (ref 0.44–1.00)
GFR calc Af Amer: >60 mL/min (ref >60)
GFR calc non Af Amer: >60 mL/min (ref >60)
Glucose, Bld: 178 mg/dL — ABNORMAL HIGH (ref 65–99)
Potassium: 3.8 mmol/L (ref 3.5–5.1)
Sodium: 135 mmol/L (ref 135–145)

## 2016-07-18 ENCOUNTER — Encounter: Payer: Self-pay | Admitting: *Deleted

## 2016-07-18 ENCOUNTER — Ambulatory Visit
Admission: RE | Admit: 2016-07-18 | Discharge: 2016-07-18 | Disposition: A | Discharge status: Home / Self Care | Payer: Medicare Other | Source: Ambulatory Visit | Attending: General Surgery | Admitting: General Surgery

## 2016-07-18 ENCOUNTER — Ambulatory Visit: Payer: Medicare Other | Admitting: Anesthesiology

## 2016-07-18 ENCOUNTER — Encounter
Admission: RE | Disposition: A | Discharge status: Home / Self Care | Payer: Self-pay | Source: Ambulatory Visit | Attending: General Surgery

## 2016-07-18 DIAGNOSIS — Z7984 Long term (current) use of oral hypoglycemic drugs: Secondary | ICD-10-CM | POA: Diagnosis not present

## 2016-07-18 DIAGNOSIS — I129 Hypertensive chronic kidney disease with stage 1 through stage 4 chronic kidney disease, or unspecified chronic kidney disease: Secondary | ICD-10-CM | POA: Diagnosis not present

## 2016-07-18 DIAGNOSIS — J45909 Unspecified asthma, uncomplicated: Secondary | ICD-10-CM | POA: Insufficient documentation

## 2016-07-18 DIAGNOSIS — Z7982 Long term (current) use of aspirin: Secondary | ICD-10-CM | POA: Insufficient documentation

## 2016-07-18 DIAGNOSIS — C211 Malignant neoplasm of anal canal: Secondary | ICD-10-CM | POA: Diagnosis not present

## 2016-07-18 DIAGNOSIS — E1122 Type 2 diabetes mellitus with diabetic chronic kidney disease: Secondary | ICD-10-CM | POA: Diagnosis not present

## 2016-07-18 DIAGNOSIS — N189 Chronic kidney disease, unspecified: Secondary | ICD-10-CM | POA: Diagnosis not present

## 2016-07-18 DIAGNOSIS — M1990 Unspecified osteoarthritis, unspecified site: Secondary | ICD-10-CM | POA: Diagnosis not present

## 2016-07-18 DIAGNOSIS — K629 Disease of anus and rectum, unspecified: Secondary | ICD-10-CM | POA: Diagnosis not present

## 2016-07-18 DIAGNOSIS — E785 Hyperlipidemia, unspecified: Secondary | ICD-10-CM | POA: Insufficient documentation

## 2016-07-18 DIAGNOSIS — Z7951 Long term (current) use of inhaled steroids: Secondary | ICD-10-CM | POA: Diagnosis not present

## 2016-07-18 DIAGNOSIS — Z79899 Other long term (current) drug therapy: Secondary | ICD-10-CM | POA: Diagnosis not present

## 2016-07-18 DIAGNOSIS — K6289 Other specified diseases of anus and rectum: Secondary | ICD-10-CM | POA: Diagnosis not present

## 2016-07-18 HISTORY — PX: TUMOR EXCISION: SHX421

## 2016-07-18 LAB — GLUCOSE, CAPILLARY
Glucose-Capillary: 114 mg/dL — ABNORMAL HIGH (ref 65–99)
Glucose-Capillary: 102 mg/dL — ABNORMAL HIGH (ref 65–99)

## 2016-07-18 SURGERY — EXCISION, NEOPLASM, RECTUM
Anesthesia: General | Wound class: Clean Contaminated

## 2016-07-18 MED ORDER — HYDROCODONE-ACETAMINOPHEN 5-325 MG PO TABS
1.0000 | ORAL_TABLET | ORAL | 0 refills | Status: DC | PRN
Start: 2016-07-18 — End: 2016-07-29

## 2016-07-18 MED ORDER — ONDANSETRON HCL 4 MG/2ML IJ SOLN
INTRAMUSCULAR | Status: AC
  Filled 2016-07-18: qty 2

## 2016-07-18 MED ORDER — BUPIVACAINE-EPINEPHRINE 0.5% -1:200000 IJ SOLN
INTRAMUSCULAR | Status: DC | PRN
  Administered 2016-07-18: 20 mL

## 2016-07-18 MED ORDER — FENTANYL CITRATE (PF) 100 MCG/2ML IJ SOLN
25.0000 ug | INTRAMUSCULAR | Status: DC | PRN
  Administered 2016-07-18: 25 ug via INTRAVENOUS

## 2016-07-18 MED ORDER — FENTANYL CITRATE (PF) 100 MCG/2ML IJ SOLN
INTRAMUSCULAR | Status: DC | PRN
  Administered 2016-07-18 (×2): 25 ug via INTRAVENOUS

## 2016-07-18 MED ORDER — PROPOFOL 10 MG/ML IV BOLUS
INTRAVENOUS | Status: DC | PRN
  Administered 2016-07-18: 100 mg via INTRAVENOUS

## 2016-07-18 MED ORDER — HYDROCODONE-ACETAMINOPHEN 5-325 MG PO TABS
1.0000 | ORAL_TABLET | ORAL | Status: DC | PRN
Start: 2016-07-18 — End: 2016-07-18
  Administered 2016-07-18: 1 via ORAL

## 2016-07-18 MED ORDER — BUPIVACAINE-EPINEPHRINE (PF) 0.5% -1:200000 IJ SOLN
INTRAMUSCULAR | Status: AC
  Filled 2016-07-18: qty 30

## 2016-07-18 MED ORDER — GLYCOPYRROLATE 0.2 MG/ML IJ SOLN
INTRAMUSCULAR | Status: DC | PRN
  Administered 2016-07-18: 0.2 mg via INTRAVENOUS

## 2016-07-18 MED ORDER — SODIUM CHLORIDE 0.9 % IV SOLN
INTRAVENOUS | Status: DC
  Administered 2016-07-18: 11:00:00 via INTRAVENOUS

## 2016-07-18 MED ORDER — ONDANSETRON HCL 4 MG/2ML IJ SOLN
INTRAMUSCULAR | Status: DC | PRN
  Administered 2016-07-18: 4 mg via INTRAVENOUS

## 2016-07-18 MED ORDER — FENTANYL CITRATE (PF) 100 MCG/2ML IJ SOLN
INTRAMUSCULAR | Status: AC
  Filled 2016-07-18: qty 2

## 2016-07-18 MED ORDER — ONDANSETRON HCL 4 MG/2ML IJ SOLN: 4.0000 mg | Freq: Once | INTRAMUSCULAR | Status: DC | PRN

## 2016-07-18 MED ORDER — FAMOTIDINE 20 MG PO TABS
20.0000 mg | ORAL_TABLET | Freq: Once | ORAL | Status: AC
  Administered 2016-07-18: 20 mg via ORAL

## 2016-07-18 MED ORDER — FAMOTIDINE 20 MG PO TABS
ORAL_TABLET | ORAL | Status: AC
  Administered 2016-07-18: 20 mg via ORAL
  Filled 2016-07-18: qty 1

## 2016-07-18 MED ORDER — HYDROCODONE-ACETAMINOPHEN 5-325 MG PO TABS
ORAL_TABLET | ORAL | Status: AC
  Filled 2016-07-18: qty 1

## 2016-07-18 SURGICAL SUPPLY — 29 items
BAG COUNTER SPONGE EZ (MISCELLANEOUS) IMPLANT
BRIEF STRETCH MATERNITY 2XLG (MISCELLANEOUS) ×2 IMPLANT
CANISTER SUCT 1200ML W/VALVE (MISCELLANEOUS) ×2 IMPLANT
DRAPE LAPAROTOMY 100X77 ABD (DRAPES) ×2 IMPLANT
DRAPE LEGGINS SURG 28X43 STRL (DRAPES) ×2 IMPLANT
DRAPE UNDER BUTTOCK W/FLU (DRAPES) ×2 IMPLANT
DRESSING TELFA 4X3 1S ST N-ADH (GAUZE/BANDAGES/DRESSINGS) ×2 IMPLANT
DRSG GAUZE PETRO 6X36 STRIP ST (GAUZE/BANDAGES/DRESSINGS) ×2 IMPLANT
ELECT REM PT RETURN 9FT ADLT (ELECTROSURGICAL) ×2
ELECTRODE REM PT RTRN 9FT ADLT (ELECTROSURGICAL) ×1 IMPLANT
GLOVE BIO SURGEON STRL SZ7.5 (GLOVE) ×4 IMPLANT
GLOVE INDICATOR 8.0 STRL GRN (GLOVE) ×4 IMPLANT
GOWN STRL REUS W/ TWL LRG LVL3 (GOWN DISPOSABLE) ×2 IMPLANT
GOWN STRL REUS W/TWL LRG LVL3 (GOWN DISPOSABLE) ×2
LABEL OR SOLS (LABEL) IMPLANT
NDL SAFETY 22GX1.5 (NEEDLE) ×2 IMPLANT
NEEDLE HYPO 25X1 1.5 SAFETY (NEEDLE) ×2 IMPLANT
PACK BASIN MINOR ARMC (MISCELLANEOUS) ×2 IMPLANT
PAD OB MATERNITY 4.3X12.25 (PERSONAL CARE ITEMS) ×2 IMPLANT
PAD PREP 24X41 OB/GYN DISP (PERSONAL CARE ITEMS) ×2 IMPLANT
STRAP SAFETY BODY (MISCELLANEOUS) IMPLANT
SUCT SIGMOIDOSCOPE TIP 18 W/TU (SUCTIONS) IMPLANT
SURGILUBE 2OZ TUBE FLIPTOP (MISCELLANEOUS) ×2 IMPLANT
SUT CHROMIC 3 0 SH 27 (SUTURE) ×2 IMPLANT
SUT SILK 0 CT 1 30 (SUTURE) ×2 IMPLANT
SUT SILK 3-0 (SUTURE) ×2 IMPLANT
SUT VIC AB 3-0 SH 27 (SUTURE) ×1
SUT VIC AB 3-0 SH 27X BRD (SUTURE) ×1 IMPLANT
SYR CONTROL 10ML (SYRINGE) ×2 IMPLANT

## 2016-07-18 NOTE — Anesthesia Preprocedure Evaluation (Addendum)
Anesthesia Evaluation  Patient identified by MRN, date of birth, ID band Patient awake    Reviewed: Allergy & Precautions, NPO status , Patient's Chart, lab work & pertinent test results, reviewed documented beta blocker date and time   Airway Mallampati: II  TM Distance: >3 FB     Dental  (+) Chipped, Partial Upper   Pulmonary asthma ,           Cardiovascular hypertension, Pt. on medications and Pt. on home beta blockers + dysrhythmias      Neuro/Psych    GI/Hepatic GERD  Controlled,  Endo/Other  diabetes, Type 2  Renal/GU Renal disease     Musculoskeletal  (+) Arthritis ,   Abdominal   Peds  Hematology   Anesthesia Other Findings Hx of Rbbb.   Reproductive/Obstetrics                           Anesthesia Physical Anesthesia Plan  ASA: III  Anesthesia Plan: General   Post-op Pain Management:    Induction: Intravenous  Airway Management Planned: LMA  Additional Equipment:   Intra-op Plan:   Post-operative Plan:   Informed Consent: I have reviewed the patients History and Physical, chart, labs and discussed the procedure including the risks, benefits and alternatives for the proposed anesthesia with the patient or authorized representative who has indicated his/her understanding and acceptance.     Plan Discussed with: CRNA  Anesthesia Plan Comments:         Anesthesia Quick Evaluation

## 2016-07-18 NOTE — H&P (Signed)
Julie Holland [id number] [birth year]     HPI: Patient with prior high grade squamous intraepithelial lesion with no ano-rectal findings.   Prescriptions Prior to Admission  Medication Sig Dispense Refill Last Dose  . acetaminophen (TYLENOL) 500 MG tablet Take 500-1,000 mg by mouth every 6 (six) hours as needed (for pain).   07/17/2016 at Unknown time  . albuterol (PROVENTIL HFA;VENTOLIN HFA) 108 (90 BASE) MCG/ACT inhaler Inhale 2 puffs into the lungs every 6 (six) hours as needed for wheezing. 6.7 g 11 Past Month at Unknown time  . aspirin 81 MG chewable tablet Chew 81 mg by mouth daily.   07/17/2016 at Unknown time  . Carboxymeth-Glycerin-Polysorb (REFRESH OPTIVE ADVANCED OP) Place 1 drop into both eyes 3 (three) times daily as needed (for dry/irritated eyes).   07/18/2016 at Unknown time  . carvedilol (COREG) 6.25 MG tablet Take 1 tablet by mouth two  times daily with a meal 180 tablet 0 07/18/2016 at 0700  . fluticasone (FLONASE) 50 MCG/ACT nasal spray Place 1 spray into both nostrils daily as needed (for allergies.).    Past Month at Unknown time  . Fluticasone-Salmeterol (ADVAIR DISKUS) 100-50 MCG/DOSE AEPB Inhale 1 puff into the lungs daily. (Patient taking differently: Inhale 1 puff into the lungs every morning. ) 90 each 2 07/18/2016 at Unknown time  . lisinopril-hydrochlorothiazide (PRINZIDE,ZESTORETIC) 20-25 MG tablet Take 1 tablet by mouth  daily 90 tablet 0 07/17/2016 at Unknown time  . metFORMIN (GLUCOPHAGE) 850 MG tablet Take 1 tablet by mouth two  times daily with meals 180 tablet 2 07/15/2016  . Multiple Vitamin (MULTIVITAMIN) tablet Take 2 tablets by mouth daily.    07/17/2016 at Unknown time  . simvastatin (ZOCOR) 20 MG tablet TAKE 1 TABLET BY MOUTH AT  BEDTIME 90 tablet 0 07/17/2016 at Unknown time  . trolamine salicylate (ASPERCREME) 10 % cream Apply 1 application topically 3 (three) times daily as needed (for arthritis pain/hip pain.).   Past Week at Unknown time   No Known  Allergies Past Medical History:  Diagnosis Date  . Arthritis   . Asthma   . 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  . Chronic kidney disease    stage 1  . Diabetes mellitus    type II  . GERD (gastroesophageal reflux disease)    OCC  . Hemorrhoids   . Hyperlipidemia   . Hypertension    Past Surgical History:  Procedure Laterality Date  . CATARACT EXTRACTION, BILATERAL    . CHOLECYSTECTOMY    . COLONOSCOPY  02/13/13   Dr Bary Castilla  . EPIGASTRIC HERNIA REPAIR N/A 03/20/2015   Procedure: HERNIA REPAIR EPIGASTRIC ADULT;  Surgeon: Robert Bellow, MD;  Location: [facility] ORS;  Service: General;  Laterality: N/A;  . HEMORRHOID SURGERY N/A 03/20/2015    FOCAL HIGH-GRADE SQUAMOUS INTRAEPITHELIAL LESION (HSIL, ANAL /HEMORRHOIDECTOMY;   Robert Bellow, MD [facility] ORS;  : General;  Laterality: N/A;  . HERNIA REPAIR  July 2016   Epigastric hernia, primary repair  . TONSILLECTOMY  age 18  . TOTAL ABDOMINAL HYSTERECTOMY  01/1989  . TUBAL LIGATION     Social History   Social History  . Marital status: Married    Spouse name: N/A  . Number of children: N/A  . Years of education: N/A   Occupational History  . Not on file.   Social History Main Topics  . Smoking status: Never Smoker  . Smokeless tobacco: Never  Used  . Alcohol use No  . Drug use: No  . Sexual activity: Yes    Birth control/ protection: Surgical   Other Topics Concern  . Not on file   Social History Narrative  . No narrative on file   Social History   Social History Narrative  . No narrative on file     ROS: Negative.     PE: HEENT: Negative. Lungs: Clear. Cardio: RR.  Prior pathology 7 o;clock position:  FOCAL HIGH-GRADE SQUAMOUS INTRAEPITHELIAL LESION (HSIL, ANAL  INTRAEPITHELIAL NEOPLASIA 3) WITHIN A HEMORRHOID SURFACED MOSTLY BY  COLUMNAR MUCOSA.   Assessment/Plan:  Proceed with planned excision of abnormal anal tissue.      Robert Bellow 07/18/2016

## 2016-07-18 NOTE — Discharge Instructions (Signed)

## 2016-07-18 NOTE — Transfer of Care (Signed)
Immediate Anesthesia Transfer of Care Note  Patient: Julie Holland  Procedure(s) Performed: Procedure(s): EXCISION RECTAL MASS (N/A)  Patient Location: PACU  Anesthesia Type:General  Level of Consciousness: sedated and responds to stimulation  Airway & Oxygen Therapy: Patient Spontanous Breathing and Patient connected to face mask oxygen  Post-op Assessment: Report given to RN and Post -op Vital signs reviewed and stable  Post vital signs: Reviewed and stable  Last Vitals:  Vitals:   07/18/16 1036 07/18/16 1310  BP: (!) 142/77 119/72  Pulse: 81 79  Resp: 18 13  Temp: (!) 36.1 C 36.4 C    Last Pain:  Vitals:   07/18/16 1036  TempSrc: Tympanic         Complications: No apparent anesthesia complications

## 2016-07-18 NOTE — Anesthesia Postprocedure Evaluation (Signed)
Anesthesia Post Note  Patient: ADRIANNAH BOROWIAK  Procedure(s) Performed: Procedure(s) (LRB): EXCISION RECTAL MASS (N/A)  Patient location during evaluation: PACU Anesthesia Type: General Level of consciousness: awake and alert Pain management: pain level controlled Vital Signs Assessment: post-procedure vital signs reviewed and stable Respiratory status: spontaneous breathing, nonlabored ventilation, respiratory function stable and patient connected to nasal cannula oxygen Cardiovascular status: blood pressure returned to baseline and stable Postop Assessment: no signs of nausea or vomiting Anesthetic complications: no    Last Vitals:  Vitals:   07/18/16 1432 07/18/16 1448  BP: 138/69 (!) 155/67  Pulse: 67 64  Resp: 16 16  Temp:      Last Pain:  Vitals:   07/18/16 1448  TempSrc:   PainSc: [place]

## 2016-07-18 NOTE — Op Note (Signed)
Preoperative diagnosis: Abnormal right anterior rectal mucosa, past history high-grade squamous inter-epithelial lesion.  Postoperative diagnosis: Same.  Operative procedure: Excision of abnormal rectal mucosa.  Operating surgeon: Hervey Ard, M.D.  Anesthesia: Gen. by LMA, Marcaine 0.5% with 1-200,000 epinephrine, 20 mL.  Assessment blood loss: 5 mL.  Clinical note: This 75 year old woman was identified with high-grade squamous intraepithelial lesions on the 7:00 position of the anorectal area with hemorrhoidectomy last year. Recent exam showed an abnormal area of mucosa on the anterior right rectal wall. She is admitted for excision.  Operative note: The patient tolerated general anesthesia well and was placed in the dorsal lithotomy position. The perineum was prepped with Betadine solution draped. Marcaine was infiltrated for postoperative analgesia and hemostasis. An anoscope was placed and Marcaine was infiltrated in the tissue plane between the mucosa and muscle for dissection and hemostasis. The area of concern was a partially 1/2 cm in diameter. This was tagged circumferentially with 3-0 silk sutures and then excised with cautery. The specimen was orientated and placed in formalin for routine histology. The wound was closed  with a running, locking 3-0 chromic suture. Good hemostasis was noted. A Vaseline gauze pack was placed for postoperative hemostasis.  The patient tolerated the procedure well and was taken to recovery room in stable condition.

## 2016-07-18 NOTE — Anesthesia Procedure Notes (Signed)
Procedure Name: LMA Insertion Date/Time: 07/18/2016 12:22 PM Performed by: Darlyne Russian Pre-anesthesia Checklist: Patient identified, Emergency Drugs available, Suction available, Patient being monitored and Timeout performed Patient Re-evaluated:Patient Re-evaluated prior to inductionOxygen Delivery Method: Circle system utilized Preoxygenation: Pre-oxygenation with 100% oxygen Intubation Type: IV induction Ventilation: Mask ventilation without difficulty LMA: LMA inserted Number of attempts: 1 Placement Confirmation: positive ETCO2 and breath sounds checked- equal and bilateral Dental Injury: Teeth and Oropharynx as per pre-operative assessment

## 2016-07-22 ENCOUNTER — Telehealth: Payer: Self-pay

## 2016-07-22 NOTE — Telephone Encounter (Signed)
Patient called and she had taken a Vicodin the evening after her surgery and had very bad nausea, vomiting, and upset stomach. She has since recovered and is doing very well. Eating with out difficulty. She reports that she has only a scant amount of bleeding. Hydrocodone has been added to her allergy list.

## 2016-07-23 ENCOUNTER — Telehealth: Payer: Self-pay | Admitting: *Deleted

## 2016-07-23 NOTE — Telephone Encounter (Signed)
Patient called looking for results from 07/18/16. I let her know they we haven't received them in our system yet.

## 2016-07-24 ENCOUNTER — Telehealth: Payer: Self-pay | Admitting: *Deleted

## 2016-07-24 NOTE — Telephone Encounter (Signed)
Patient had called answering service this morning. She states since her surgery she had noticed some bleeding with her BM which eas expected and it had seemed to slack off until this morning she passed 2 clots with no Bm. She states she did strain some with her BM yesterday and had 2 tiny clots when passing gas yesterday. She has not had a BM as of yet. She is aware to monitor and call the office back with status report after lunch and that I would be letting Dr Bary Castilla know and call her back sooner if necessary once I talked with him, pt agrees. She states she feels "OK". Dr Bary Castilla is aware and bleeding is to be expected on and off for up to 2 weeks, pt aware. She will call our office for further concerns. Follow up as scheduled.

## 2016-07-25 ENCOUNTER — Telehealth: Payer: Self-pay

## 2016-07-25 ENCOUNTER — Other Ambulatory Visit: Payer: Self-pay | Admitting: General Surgery

## 2016-07-25 ENCOUNTER — Telehealth: Payer: Self-pay | Admitting: General Surgery

## 2016-07-25 DIAGNOSIS — I10 Essential (primary) hypertension: Secondary | ICD-10-CM

## 2016-07-25 LAB — SURGICAL PATHOLOGY

## 2016-07-25 MED ORDER — LISINOPRIL-HYDROCHLOROTHIAZIDE 20-25 MG PO TABS: 1.0000 | ORAL_TABLET | Freq: Every day | ORAL | 0 refills | Status: DC

## 2016-07-25 MED ORDER — CARVEDILOL 6.25 MG PO TABS: ORAL_TABLET | ORAL | 0 refills | Status: DC

## 2016-07-25 NOTE — Telephone Encounter (Signed)
Medication has been refilled and sent to [facility] 

## 2016-07-25 NOTE — Telephone Encounter (Signed)
Allergy results of finally becoming available. 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 No written report yet although one margin reported as positive.  Patient will likely be a candidate for radiation therapy to the area.  She reports decreased bleeding since her call from yesterday. She'll follow-up on November 21 as scheduled. Her case will be presented at the [facility] tumor board on November 30.

## 2016-07-29 ENCOUNTER — Ambulatory Visit (INDEPENDENT_AMBULATORY_CARE_PROVIDER_SITE_OTHER): Payer: Medicare Other | Admitting: General Surgery

## 2016-07-29 ENCOUNTER — Other Ambulatory Visit: Payer: Self-pay | Admitting: General Surgery

## 2016-07-29 VITALS — BP 138/82 | HR 84 | Resp 14 | Ht 65.0 in | Wt 120.0 lb

## 2016-07-29 DIAGNOSIS — K629 Disease of anus and rectum, unspecified: Secondary | ICD-10-CM | POA: Diagnosis not present

## 2016-07-29 DIAGNOSIS — C211 Malignant neoplasm of anal canal: Secondary | ICD-10-CM | POA: Diagnosis not present

## 2016-07-29 NOTE — Progress Notes (Signed)
Patient ID: Julie Holland, female   DOB: March 11, 1941, 75 y.o.   MRN: [id number]  Chief Complaint  Patient presents with  . Routine Post Op    HPI Julie Holland is a 75 y.o. female here today for her post op anal mass removal on 07/25/2016. She states the bleeding is much less and not every day.  HPI  Past Medical History:  Diagnosis Date  . Arthritis   . Asthma   . 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  . Chronic kidney disease    stage 1  . Diabetes mellitus    type II  . GERD (gastroesophageal reflux disease)    OCC  . Hemorrhoids   . Hyperlipidemia   . Hypertension     Past Surgical History:  Procedure Laterality Date  . CATARACT EXTRACTION, BILATERAL    . CHOLECYSTECTOMY    . COLONOSCOPY  02/13/13   Dr Bary Castilla  . EPIGASTRIC HERNIA REPAIR N/A 03/20/2015   Procedure: HERNIA REPAIR EPIGASTRIC ADULT;  Surgeon: Robert Bellow, MD;  Location: [facility] ORS;  Service: General;  Laterality: N/A;  . HEMORRHOID SURGERY N/A 03/20/2015    FOCAL HIGH-GRADE SQUAMOUS INTRAEPITHELIAL LESION (HSIL, ANAL /HEMORRHOIDECTOMY;   Robert Bellow, MD [facility] ORS;  : General;  Laterality: N/A;  . HERNIA REPAIR  July 2016   Epigastric hernia, primary repair  . TONSILLECTOMY  age 38  . TOTAL ABDOMINAL HYSTERECTOMY  01/1989  . TUBAL LIGATION    . TUMOR EXCISION N/A 07/18/2016   Procedure: EXCISION RECTAL MASS;  Surgeon: Robert Bellow, MD;  Location: [facility] ORS;  Service: General;  Laterality: N/A;    Family History  Problem Relation Age of Onset  . Bladder Cancer Mother   . Colon cancer Father   . Lung cancer Brother   . Lymphoma Sister     Social History Social History  Substance Use Topics  . Smoking status: Never Smoker  . Smokeless tobacco: Never Used  . Alcohol use No    Allergies  Allergen Reactions  . Hydrocodone Nausea And Vomiting and Other (See Comments)    Vertigo    Current Outpatient Prescriptions   Medication Sig Dispense Refill  . acetaminophen (TYLENOL) 500 MG tablet Take 500-1,000 mg by mouth every 6 (six) hours as needed (for pain).    Marland Kitchen albuterol (PROVENTIL HFA;VENTOLIN HFA) 108 (90 BASE) MCG/ACT inhaler Inhale 2 puffs into the lungs every 6 (six) hours as needed for wheezing. 6.7 g 11  . aspirin 81 MG chewable tablet Chew 81 mg by mouth daily.    . Carboxymeth-Glycerin-Polysorb (REFRESH OPTIVE ADVANCED OP) Place 1 drop into both eyes 3 (three) times daily as needed (for dry/irritated eyes).    . carvedilol (COREG) 6.25 MG tablet Take 1 tablet by mouth two  times daily with a meal 180 tablet 0  . fluticasone (FLONASE) 50 MCG/ACT nasal spray Place 1 spray into both nostrils daily as needed (for allergies.).     Marland Kitchen Fluticasone-Salmeterol (ADVAIR DISKUS) 100-50 MCG/DOSE AEPB Inhale 1 puff into the lungs daily. (Patient taking differently: Inhale 1 puff into the lungs every morning. ) 90 each 2  . lisinopril-hydrochlorothiazide (PRINZIDE,ZESTORETIC) 20-25 MG tablet Take 1 tablet by mouth daily. 90 tablet 0  . metFORMIN (GLUCOPHAGE) 850 MG tablet Take 1 tablet by mouth two  times daily with meals 180 tablet 2  . Multiple Vitamin (MULTIVITAMIN) tablet Take 2 tablets by mouth daily.     Marland Kitchen  simvastatin (ZOCOR) 20 MG tablet TAKE 1 TABLET BY MOUTH AT  BEDTIME 90 tablet 0  . trolamine salicylate (ASPERCREME) 10 % cream Apply 1 application topically 3 (three) times daily as needed (for arthritis pain/hip pain.).     No current facility-administered medications for this visit.     Review of Systems Review of Systems  Constitutional: Negative.   Respiratory: Negative.   Cardiovascular: Negative.   Gastrointestinal: Negative.  Negative for constipation, diarrhea, nausea and vomiting.    Blood pressure 138/82, pulse 84, resp. rate 14, height 5\' 5"  (1.651 m), weight 120 lb (54.4 kg).  Physical Exam Physical Exam  Constitutional: She is oriented to person, place, and time. She appears  well-developed and well-nourished.  Genitourinary:     Genitourinary Comments: Clean rectal incisional base with minimal bleeding noted  Lymphadenopathy:       Right: No inguinal adenopathy present.       Left: No inguinal adenopathy present.  Neurological: She is alert and oriented to person, place, and time.  Skin: Skin is warm and dry.  Psychiatric: Her behavior is normal.    Data Reviewed A. RECTUM MASS; EXCISION:  - FOCAL INVASIVE SQUAMOUS CELL CARCINOMA OF THE ANAL CANAL ARISING IN A  BACKGROUND OF HIGH-GRADE SQUAMOUS INTRAEPITHELIAL LESION (HSIL).  - HSIL INVOLVES A PERIPHERAL MARGIN.  - DISTAL ANAL MARGIN, RECTAL MARGIN, AND DEEP MARGIN ARE CLEAR BASED ON  SEQUENTIALLY SUBMITTED SECTIONS.    ANUS: Excisional Biopsy or Local Excision (Transanal Disk Excision)  Specimens InvolvedA: Rectum mass   Anus, Excisional Biopsy or Local Excision Cancer Case Summary  SPECIMEN  Specimen: Rectum  Procedure:   Other (specify)  Excision  Specimen Integrity: Intact  Tumor Site:  Other (specify)  Anterior rectal wall  TUMOR  Histologic Type:  Squamous cell carcinoma  Histologic Grade:  G2: Moderately differentiated  EXTENT  Tumor Size:  Greatest dimension (cm)  0.1cm  Microscopic Tumor Extension: Tumor invades submucosa  MARGINS  Margins: Margin(s) involved by invasive carcinoma  Specify Margin (if possible): Specify margin  peripheral  ACCESSORY FINDINGS  Lymph-Vascular Invasion: Not Identified  STAGE (pTNM)  Primary Tumor (pT): pT1: Tumor 2 cm or less in greatest dimension  No margin involvement with invasive squamous cancer.  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 Excised to negative margins, positive margin for high-grade squamous intraepithelial lesion.    Plan    The patient reported that during her recovery she thought that she would never have any more surgery. She is certainly not under any obligation at this time to  consider more surgery or additional treatments. Her case will be presented at the [facility] tumor board in early December. Informal consultation with radiation oncology did not suggest that additional treatment would be necessary. We'll plan for a follow-up exam in early January, and she'll be contacted with the results of the tumor board recommendations.        This information has been scribed by Karie Fetch RN, BSN,BC.   Robert Bellow 07/29/2016, 8:35 PM

## 2016-07-31 ENCOUNTER — Inpatient Hospital Stay: Payer: Medicare Other | Admitting: Anesthesiology

## 2016-07-31 ENCOUNTER — Inpatient Hospital Stay
Admission: EM | Admit: 2016-07-31 | Discharge: 2016-08-01 | DRG: 909 | Disposition: A | Discharge status: Home / Self Care | Payer: Medicare Other | Attending: General Surgery | Admitting: General Surgery

## 2016-07-31 ENCOUNTER — Encounter
Admission: EM | Disposition: A | Discharge status: Home / Self Care | Payer: Self-pay | Source: Home / Self Care | Attending: General Surgery

## 2016-07-31 ENCOUNTER — Inpatient Hospital Stay: Payer: Medicare Other

## 2016-07-31 ENCOUNTER — Inpatient Hospital Stay: Admit: 2016-07-31 | Payer: Medicare Other | Admitting: General Surgery

## 2016-07-31 ENCOUNTER — Encounter: Payer: Self-pay | Admitting: Emergency Medicine

## 2016-07-31 DIAGNOSIS — Z807 Family history of other malignant neoplasms of lymphoid, hematopoietic and related tissues: Secondary | ICD-10-CM

## 2016-07-31 DIAGNOSIS — Z9842 Cataract extraction status, left eye: Secondary | ICD-10-CM | POA: Diagnosis not present

## 2016-07-31 DIAGNOSIS — Z800 Family history of malignant neoplasm of digestive organs: Secondary | ICD-10-CM

## 2016-07-31 DIAGNOSIS — Z9841 Cataract extraction status, right eye: Secondary | ICD-10-CM | POA: Diagnosis not present

## 2016-07-31 DIAGNOSIS — N181 Chronic kidney disease, stage 1: Secondary | ICD-10-CM | POA: Diagnosis present

## 2016-07-31 DIAGNOSIS — K91840 Postprocedural hemorrhage and hematoma of a digestive system organ or structure following a digestive system procedure: Secondary | ICD-10-CM | POA: Diagnosis not present

## 2016-07-31 DIAGNOSIS — E1122 Type 2 diabetes mellitus with diabetic chronic kidney disease: Secondary | ICD-10-CM | POA: Diagnosis not present

## 2016-07-31 DIAGNOSIS — L7622 Postprocedural hemorrhage and hematoma of skin and subcutaneous tissue following other procedure: Principal | ICD-10-CM | POA: Diagnosis present

## 2016-07-31 DIAGNOSIS — Z801 Family history of malignant neoplasm of trachea, bronchus and lung: Secondary | ICD-10-CM

## 2016-07-31 DIAGNOSIS — K219 Gastro-esophageal reflux disease without esophagitis: Secondary | ICD-10-CM | POA: Diagnosis not present

## 2016-07-31 DIAGNOSIS — H9192 Unspecified hearing loss, left ear: Secondary | ICD-10-CM | POA: Diagnosis present

## 2016-07-31 DIAGNOSIS — Z7982 Long term (current) use of aspirin: Secondary | ICD-10-CM

## 2016-07-31 DIAGNOSIS — C44520 Squamous cell carcinoma of anal skin: Secondary | ICD-10-CM | POA: Diagnosis not present

## 2016-07-31 DIAGNOSIS — Y838 Other surgical procedures as the cause of abnormal reaction of the patient, or of later complication, without mention of misadventure at the time of the procedure: Secondary | ICD-10-CM | POA: Diagnosis not present

## 2016-07-31 DIAGNOSIS — K625 Hemorrhage of anus and rectum: Secondary | ICD-10-CM | POA: Diagnosis not present

## 2016-07-31 DIAGNOSIS — K922 Gastrointestinal hemorrhage, unspecified: Secondary | ICD-10-CM

## 2016-07-31 DIAGNOSIS — E785 Hyperlipidemia, unspecified: Secondary | ICD-10-CM | POA: Diagnosis not present

## 2016-07-31 DIAGNOSIS — Z7951 Long term (current) use of inhaled steroids: Secondary | ICD-10-CM

## 2016-07-31 DIAGNOSIS — Z8052 Family history of malignant neoplasm of bladder: Secondary | ICD-10-CM

## 2016-07-31 DIAGNOSIS — I129 Hypertensive chronic kidney disease with stage 1 through stage 4 chronic kidney disease, or unspecified chronic kidney disease: Secondary | ICD-10-CM | POA: Diagnosis present

## 2016-07-31 DIAGNOSIS — Z885 Allergy status to narcotic agent status: Secondary | ICD-10-CM | POA: Diagnosis not present

## 2016-07-31 DIAGNOSIS — N189 Chronic kidney disease, unspecified: Secondary | ICD-10-CM | POA: Diagnosis not present

## 2016-07-31 DIAGNOSIS — J45909 Unspecified asthma, uncomplicated: Secondary | ICD-10-CM | POA: Diagnosis not present

## 2016-07-31 LAB — PREPARE RBC (CROSSMATCH)

## 2016-07-31 LAB — PROTIME-INR
INR: 1.06
Prothrombin Time: 13.8 seconds (ref 11.4–15.2)

## 2016-07-31 LAB — COMPREHENSIVE METABOLIC PANEL
ALT: 22 U/L (ref 14–54)
AST: 31 U/L (ref 15–41)
Albumin: 3.8 g/dL (ref 3.5–5.0)
Alkaline Phosphatase: 59 U/L (ref 38–126)
Anion gap: 7 (ref 5–15)
BUN: 22 mg/dL — ABNORMAL HIGH (ref 6–20)
CO2: 28 mmol/L (ref 22–32)
Calcium: 9.5 mg/dL (ref 8.9–10.3)
Chloride: 100 mmol/L — ABNORMAL LOW (ref 101–111)
Creatinine, Ser: 0.51 mg/dL (ref 0.44–1.00)
GFR calc Af Amer: >60 mL/min (ref >60)
GFR calc non Af Amer: >60 mL/min (ref >60)
Glucose, Bld: 164 mg/dL — ABNORMAL HIGH (ref 65–99)
Potassium: 3.2 mmol/L — ABNORMAL LOW (ref 3.5–5.1)
Sodium: 135 mmol/L (ref 135–145)
Total Bilirubin: 0.7 mg/dL (ref 0.3–1.2)
Total Protein: 6.7 g/dL (ref 6.5–8.1)

## 2016-07-31 LAB — CBC
HCT: 27.6 % — ABNORMAL LOW (ref 35.0–47.0)
Hemoglobin: 9.3 g/dL — ABNORMAL LOW (ref 12.0–16.0)
MCH: 30.7 pg (ref 26.0–34.0)
MCHC: 33.7 g/dL (ref 32.0–36.0)
MCV: 90.9 fL (ref 80.0–100.0)
Platelets: 81 10*3/uL — ABNORMAL LOW (ref 150–440)
RBC: 3.03 MIL/uL — ABNORMAL LOW (ref 3.80–5.20)
RDW: 16 % — ABNORMAL HIGH (ref 11.5–14.5)
WBC: 5.3 10*3/uL (ref 3.6–11.0)

## 2016-07-31 LAB — ABO/RH: ABO/RH(D): A POS

## 2016-07-31 LAB — HEMOGLOBIN: Hemoglobin: 6.8 g/dL — ABNORMAL LOW (ref 12.0–16.0)

## 2016-07-31 LAB — MRSA PCR SCREENING: MRSA by PCR: NEGATIVE

## 2016-07-31 SURGERY — EXAM UNDER ANESTHESIA
Anesthesia: General | Site: Rectum | Wound class: Contaminated

## 2016-07-31 SURGERY — EXAM UNDER ANESTHESIA
Anesthesia: General

## 2016-07-31 MED ORDER — PROPOFOL 500 MG/50ML IV EMUL
INTRAVENOUS | Status: DC | PRN
  Administered 2016-07-31: 100 ug/kg/min via INTRAVENOUS

## 2016-07-31 MED ORDER — ONDANSETRON HCL 4 MG/2ML IJ SOLN: 4.0000 mg | Freq: Once | INTRAMUSCULAR | Status: DC | PRN

## 2016-07-31 MED ORDER — TRAMADOL HCL 50 MG PO TABS: 50.0000 mg | ORAL_TABLET | ORAL | Status: DC | PRN

## 2016-07-31 MED ORDER — BUPIVACAINE-EPINEPHRINE (PF) 0.5% -1:200000 IJ SOLN
INTRAMUSCULAR | Status: DC | PRN
  Administered 2016-07-31: 30 mL via PERINEURAL

## 2016-07-31 MED ORDER — ACETAMINOPHEN 325 MG PO TABS: 650.0000 mg | ORAL_TABLET | ORAL | Status: DC | PRN

## 2016-07-31 MED ORDER — HYDROCORTISONE ACETATE 25 MG RE SUPP
25.0000 mg | Freq: Once | RECTAL | Status: AC
  Administered 2016-07-31: 25 mg via RECTAL
  Filled 2016-07-31: qty 1

## 2016-07-31 MED ORDER — LIDOCAINE 2% (20 MG/ML) 5 ML SYRINGE
INTRAMUSCULAR | Status: DC | PRN
  Administered 2016-07-31: 50 mg via INTRAVENOUS

## 2016-07-31 MED ORDER — FLUTICASONE FUROATE-VILANTEROL 100-25 MCG/INH IN AEPB
1.0000 | INHALATION_SPRAY | Freq: Every day | RESPIRATORY_TRACT | Status: DC
  Administered 2016-08-01: 1 via RESPIRATORY_TRACT
  Filled 2016-07-31: qty 28

## 2016-07-31 MED ORDER — SODIUM CHLORIDE 0.9 % IV SOLN
INTRAVENOUS | Status: DC
  Administered 2016-07-31 – 2016-08-01 (×3): via INTRAVENOUS

## 2016-07-31 MED ORDER — CARBOXYMETH-GLYCERIN-POLYSORB 0.5-1-0.5 % OP SOLN
Freq: Three times a day (TID) | OPHTHALMIC | Status: DC | PRN
Start: 2016-07-31 — End: 2016-07-31

## 2016-07-31 MED ORDER — ALBUTEROL SULFATE (2.5 MG/3ML) 0.083% IN NEBU: 3.0000 mL | INHALATION_SOLUTION | Freq: Four times a day (QID) | RESPIRATORY_TRACT | Status: DC | PRN

## 2016-07-31 MED ORDER — FLUTICASONE PROPIONATE 50 MCG/ACT NA SUSP
1.0000 | Freq: Every day | NASAL | Status: DC | PRN
  Filled 2016-07-31: qty 16

## 2016-07-31 MED ORDER — FENTANYL CITRATE (PF) 100 MCG/2ML IJ SOLN
INTRAMUSCULAR | Status: DC | PRN
  Administered 2016-07-31: 50 ug via INTRAVENOUS
  Administered 2016-07-31 (×2): 25 ug via INTRAVENOUS

## 2016-07-31 MED ORDER — FENTANYL CITRATE (PF) 100 MCG/2ML IJ SOLN: 25.0000 ug | INTRAMUSCULAR | Status: DC | PRN

## 2016-07-31 MED ORDER — POLYVINYL ALCOHOL 1.4 % OP SOLN
1.0000 [drp] | Freq: Three times a day (TID) | OPHTHALMIC | Status: DC | PRN
  Filled 2016-07-31: qty 15

## 2016-07-31 MED ORDER — SODIUM CHLORIDE 0.9 % IV SOLN
Freq: Once | INTRAVENOUS | Status: AC
  Administered 2016-07-31: 18:00:00 via INTRAVENOUS

## 2016-07-31 MED ORDER — PROPOFOL 10 MG/ML IV BOLUS
INTRAVENOUS | Status: DC | PRN
  Administered 2016-07-31: 40 mg via INTRAVENOUS
  Administered 2016-07-31: 20 mg via INTRAVENOUS

## 2016-07-31 MED ORDER — HYDROCODONE-ACETAMINOPHEN 5-325 MG PO TABS: 1.0000 | ORAL_TABLET | ORAL | Status: DC | PRN

## 2016-07-31 MED ORDER — ACETAMINOPHEN 650 MG RE SUPP: 650.0000 mg | Freq: Four times a day (QID) | RECTAL | Status: DC | PRN

## 2016-07-31 MED ORDER — IOPAMIDOL (ISOVUE-300) INJECTION 61%
30.0000 mL | INTRAVENOUS | Status: AC
Start: 2016-07-31 — End: 2016-07-31
  Administered 2016-07-31: 15 mL via ORAL

## 2016-07-31 MED ORDER — KETAMINE HCL 50 MG/ML IJ SOLN
INTRAMUSCULAR | Status: DC | PRN
  Administered 2016-07-31: 25 mg via INTRAVENOUS

## 2016-07-31 MED ORDER — TECHNETIUM TC 99M-LABELED RED BLOOD CELLS IV KIT
20.8390 | PACK | Freq: Once | INTRAVENOUS | Status: AC | PRN
  Administered 2016-07-31: 20.839 via INTRAVENOUS

## 2016-07-31 SURGICAL SUPPLY — 5 items
NS IRRIG 500ML POUR BTL (IV SOLUTION) ×4 IMPLANT
SPONGE XRAY 4X4 16PLY STRL (MISCELLANEOUS) ×4 IMPLANT
SUT SURGILON 0 30 BLK (SUTURE) ×2 IMPLANT
SUT VIC AB 3-0 SH 27 (SUTURE) ×5
SUT VIC AB 3-0 SH 27X BRD (SUTURE) ×5 IMPLANT

## 2016-07-31 NOTE — Anesthesia Postprocedure Evaluation (Signed)
Anesthesia Post Note  Patient: Julie Holland  Procedure(s) Performed: Procedure(s) (LRB): EXAM UNDER ANESTHESIA (N/A)  Patient location during evaluation: PACU Anesthesia Type: General Level of consciousness: awake and alert and oriented Pain management: pain level controlled Vital Signs Assessment: post-procedure vital signs reviewed and stable Respiratory status: spontaneous breathing Cardiovascular status: blood pressure returned to baseline Anesthetic complications: no    Last Vitals:  Vitals:   07/31/16 1610 07/31/16 1700  BP: 111/70 (!) 133/58  Pulse: 83 75  Resp: (!) 23 16  Temp: 37.2 C 36.7 C    Last Pain:  Vitals:   07/31/16 1700  TempSrc: Oral  PainSc:                  Jobanny Mavis

## 2016-07-31 NOTE — ED Notes (Signed)
Report called to Abby, RN 

## 2016-07-31 NOTE — ED Notes (Addendum)
This RN obtained patient consent and placed into the chart. Pt denies any needs at this time. Will continue to monitor for further patient needs.

## 2016-07-31 NOTE — ED Triage Notes (Signed)
Pt arrived from home by EMS with c/o rectal bleed\ing. EMS reports pt has had rectal bleeding since 2000 07/30/16, soaking through 4 medium thickness menstrual pads. Pt had rectal mass removed on [id number], denies complications until current situation. Upon arrival to ED pt is A&O x4.

## 2016-07-31 NOTE — ED Notes (Signed)
Dr. Bary Castilla transported instruments to OR at this time.

## 2016-07-31 NOTE — ED Notes (Signed)
Rosalin Hawking, MD at bedside.

## 2016-07-31 NOTE — Anesthesia Preprocedure Evaluation (Addendum)
Anesthesia Evaluation  Patient identified by MRN, date of birth, ID band Patient awake    Reviewed: Allergy & Precautions, NPO status , Patient's Chart, lab work & pertinent test results, reviewed documented beta blocker date and time   Airway Mallampati: II  TM Distance: >3 FB     Dental  (+) Chipped, Partial Upper   Pulmonary asthma ,    Pulmonary exam normal        Cardiovascular hypertension, Pt. on medications and Pt. on home beta blockers Normal cardiovascular exam+ dysrhythmias      Neuro/Psych Hearing loss    GI/Hepatic GERD  Controlled,Anal CA Hx   Endo/Other  diabetes, Type 2  Renal/GU Renal disease     Musculoskeletal  (+) Arthritis ,   Abdominal   Peds  Hematology  (+) anemia ,   Anesthesia Other Findings Hx of Rbbb. Past Medical History: No date: Arthritis No date: Asthma July 2016: Cancer [facility])     Comment: In situ carcinoma of the perianal skin,               incidental finding at hemorrhoidectomy. No date: Chronic kidney disease     Comment: stage 1 No date: Diabetes mellitus     Comment: type II No date: GERD (gastroesophageal reflux disease)     Comment: OCC No date: Hemorrhoids No date: Hyperlipidemia No date: Hypertension  Reproductive/Obstetrics                            Anesthesia Physical  Anesthesia Plan  ASA: III and emergent  Anesthesia Plan: General   Post-op Pain Management:    Induction: Intravenous  Airway Management Planned: Nasal Cannula  Additional Equipment:   Intra-op Plan:   Post-operative Plan:   Informed Consent: I have reviewed the patients History and Physical, chart, labs and discussed the procedure including the risks, benefits and alternatives for the proposed anesthesia with the patient or authorized representative who has indicated his/her understanding and acceptance.   Dental advisory given  Plan Discussed with:  CRNA and Surgeon  Anesthesia Plan Comments: (Recent preop hemoglobin is 6.8, and case was discussed with Dr. Bary Castilla.  The case will be of very short duration and the patient will have gentle sedation, so we will transfuse after the case)       Anesthesia Quick Evaluation

## 2016-07-31 NOTE — ED Notes (Signed)
This RN to bedside with Dr. Bary Castilla. Dr. Bary Castilla placed plug into patient's rectum. Per MD pt is going to OR later today.

## 2016-07-31 NOTE — Progress Notes (Signed)
Rectal packing removed patient tolerated well.

## 2016-07-31 NOTE — Op Note (Signed)
Preoperative diagnosis: Bleeding from recent surgery site status post excision anal squamous cell carcinoma.  Postoperative diagnosis: Same.  Operative procedure: Exam under anesthesia, suture ligation of bleeding site.  Operating surgeon: Hervey Ard, M.D.  Anesthesia: Attended local, 30 mL 0.5% Xylocaine with 0.25% Marcaine with 1-200,000 of epinephrine.  Past medical loss: 15 mL.  Clinical note: This 75 year old woman underwent transanal excision of a focal area of invasive squamous cell carcinoma 13 days ago. She presented to the emergency room this morning in the early hours with reports of large volume bleeding with clots. Examination the emergency room suggested bleeding from the superior aspect of the excision site. Bleeding scan was completed to confirm no additional source and this was indeed negative. The patient is brought to the operative for planned exam under anesthesia and suture ligation of bleeding site.  Operative note: The patient was placed in dorsal lithotomy position and the perineum prepped. Of note there didn't scant bleeding since a rectal pack had been placed at the time of her emergency department evaluation at 7:00 this morning and subsequent expulsion during her nuclear medicine scan approximately 11 AM today. The anterior rectal wall showed area of adherent clot at the 12:00 position. This was moved away and a small bleeding site was noted. In the ED a small vessel of the noted in this area with active bleeding. This area was suture ligated with multiple 3-0 Vicryl figure-of-eight sutures. The more superficial, anal side, area was also reapproximated with interrupted 3-0 Vicryl figure-of-eight sutures. The area was irrigated and good hemostasis was noted. Of note, stool coming from above was pale green without evidence of blood.  A Vaseline gauze pack was placed for removal in the recovery area.  The patient tolerated the procedure well and was taken recovery in  stable condition.

## 2016-07-31 NOTE — ED Notes (Signed)
Sigmoidoscopy Tray and suction set up at bedside, per MD orders. ([place])

## 2016-07-31 NOTE — H&P (Signed)
Julie Holland is an 75 y.o. female.   Chief Complaint: Rectal bleeding HPI:  2 weeks s/p transanal excision of foci of invasive squamous cell carcinoma. Spotty bleeding until last PM when she had large volume clots. Exam 2 days ago had shown about 70% healing of defect. Reported about an hour worth of bleeding at 8 PM last night with clots, then recurrent bleeding 3-4 AM today.    Past Medical History:  Diagnosis Date  . Arthritis   . Asthma   . 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  . Chronic kidney disease    stage 1  . Diabetes mellitus    type II  . GERD (gastroesophageal reflux disease)    OCC  . Hemorrhoids   . Hyperlipidemia   . Hypertension     Past Surgical History:  Procedure Laterality Date  . CATARACT EXTRACTION, BILATERAL    . CHOLECYSTECTOMY    . COLONOSCOPY  02/13/13   Dr Bary Castilla  . EPIGASTRIC HERNIA REPAIR N/A 03/20/2015   Procedure: HERNIA REPAIR EPIGASTRIC ADULT;  Surgeon: Robert Bellow, MD;  Location: [facility] ORS;  Service: General;  Laterality: N/A;  . HEMORRHOID SURGERY N/A 03/20/2015    FOCAL HIGH-GRADE SQUAMOUS INTRAEPITHELIAL LESION (HSIL, ANAL /HEMORRHOIDECTOMY;   Robert Bellow, MD [facility] ORS;  : General;  Laterality: N/A;  . HERNIA REPAIR  July 2016   Epigastric hernia, primary repair  . TONSILLECTOMY  age 81  . TOTAL ABDOMINAL HYSTERECTOMY  01/1989  . TUBAL LIGATION    . TUMOR EXCISION N/A 07/18/2016   Procedure: EXCISION RECTAL MASS;  Surgeon: Robert Bellow, MD;  Location: [facility] ORS;  Service: General;  Laterality: N/A;    Family History  Problem Relation Age of Onset  . Bladder Cancer Mother   . Colon cancer Father   . Lung cancer Brother   . Lymphoma Sister    Social History:  reports that she has never smoked. She has never used smokeless tobacco. She reports that she does not drink alcohol or use drugs.  Allergies:  Allergies  Allergen Reactions  . Hydrocodone  Nausea And Vomiting and Other (See Comments)    Vertigo     (Not in a hospital admission)  Results for orders placed or performed during the hospital encounter of 07/31/16 (from the past 48 hour(s))  CBC     Status: Abnormal   Collection Time: 07/31/16  4:24 AM  Result Value Ref Range   WBC 5.3 3.6 - 11.0 K/uL   RBC 3.03 (L) 3.80 - 5.20 MIL/uL   Hemoglobin 9.3 (L) 12.0 - 16.0 g/dL   HCT 27.6 (L) 35.0 - 47.0 %   MCV 90.9 80.0 - 100.0 fL   MCH 30.7 26.0 - 34.0 pg   MCHC 33.7 32.0 - 36.0 g/dL   RDW 16.0 (H) 11.5 - 14.5 %   Platelets 81 (L) 150 - 440 K/uL  Comprehensive metabolic panel     Status: Abnormal   Collection Time: 07/31/16  4:24 AM  Result Value Ref Range   Sodium 135 135 - 145 mmol/L   Potassium 3.2 (L) 3.5 - 5.1 mmol/L   Chloride 100 (L) 101 - 111 mmol/L   CO2 28 22 - 32 mmol/L   Glucose, Bld 164 (H) 65 - 99 mg/dL   BUN 22 (H) 6 - 20 mg/dL   Creatinine, Ser 0.51 0.44 - 1.00 mg/dL   Calcium 9.5 8.9 - 10.3  mg/dL   Total Protein 6.7 6.5 - 8.1 g/dL   Albumin 3.8 3.5 - 5.0 g/dL   AST 31 15 - 41 U/L   ALT 22 14 - 54 U/L   Alkaline Phosphatase 59 38 - 126 U/L   Total Bilirubin 0.7 0.3 - 1.2 mg/dL   GFR calc non Af Amer >60 >60 mL/min   GFR calc Af Amer >60 >60 mL/min    Comment: (NOTE) The eGFR has been calculated using the CKD EPI equation. This calculation has not been validated in all clinical situations. eGFR's persistently <60 mL/min signify possible Chronic Kidney Disease.    Anion gap 7 5 - 15  Protime-INR     Status: None   Collection Time: 07/31/16  4:24 AM  Result Value Ref Range   Prothrombin Time 13.8 11.4 - 15.2 seconds   INR 1.06   Type and screen [place]     Status: None   Collection Time: 07/31/16  4:24 AM  Result Value Ref Range   ABO/RH(D) A POS    Antibody Screen NEG    Sample Expiration 08/03/2016    No results found.  ROS  Blood pressure 139/78, pulse 92, temperature 98 F (36.7 C), temperature source Oral,  resp. rate 14, height _0  (1.651 m), weight 121 lb (54.9 kg), SpO2 100 %. Physical Exam  Constitutional: She appears well-developed and well-nourished.  HENT:  Head: Normocephalic.  Eyes: Conjunctivae are normal.  Cardiovascular: Normal rate and regular rhythm.   Respiratory: Effort normal and breath sounds normal.  GI: Soft. She exhibits distension (mild, non-tenderer, normal BS. ). There is no tenderness.  Genitourinary:     Genitourinary Comments: Sigmoidoscopy showed formed, green stool without admixed blood.      Assessment/Plan Labs reviewed. Volume of bleeding is unusual for ano-rectal source, and patient does have a known history of extensive diverticulosis. No history or medications to suggest UGI source.  Plan: Bleeding scan to assess for secondary source, follow HGB,  OR today to oversew vessel.   Robert Bellow, MD 07/31/2016, 7:32 AM

## 2016-07-31 NOTE — ED Notes (Signed)
This RN to bedside. Pt's brief changed due to blood. Pt reports feelings a pressure sensation and a tugging sensation, this RN explained to patient that MD had placed a dressing into her rectum and a string was hanging out to allow for the dressing to be retrieved. Pt states understanding. Pt denies any needs at this time.

## 2016-07-31 NOTE — Transfer of Care (Signed)
Immediate Anesthesia Transfer of Care Note  Patient: Julie Holland  Procedure(s) Performed: Procedure(s): EXAM UNDER ANESTHESIA (N/A)  Patient Location: PACU  Anesthesia Type:General  Level of Consciousness: awake and alert   Airway & Oxygen Therapy: Patient Spontanous Breathing and Patient connected to face mask oxygen  Post-op Assessment: Report given to RN and Post -op Vital signs reviewed and stable  Post vital signs: Reviewed  Last Vitals:  Vitals:   07/31/16 1308 07/31/16 1557  BP: (!) 124/58 116/64  Pulse:  80  Resp:  20  Temp:  36.8 C    Last Pain:  Vitals:   07/31/16 0921  TempSrc: Oral         Complications: No apparent anesthesia complications

## 2016-07-31 NOTE — ED Provider Notes (Signed)
[facility] Emergency Department Provider Note   First MD Initiated Contact with Patient 07/31/16 [phone]     (approximate)  I have reviewed the triage vital signs and the nursing notes.   HISTORY  Chief Complaint Rectal Bleeding    HPI Julie Holland is a 75 y.o. female with history of anal carcinoma status post excision performed by Dr. Tollie Pizza on 07/18/2016 presents to the emergency department with history of increasing burning or blood per rectum since 8:00 PM last night. Patient states that she saturated 4 pads at home and has been passing large clots. Patient also admits to generalized weakness and dizziness with positional change. Patient denies any abdominal pain no nausea or vomiting. Past Medical History:  Diagnosis Date  . Arthritis   . Asthma   . 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  . Chronic kidney disease    stage 1  . Diabetes mellitus    type II  . GERD (gastroesophageal reflux disease)    OCC  . Hemorrhoids   . Hyperlipidemia   . Hypertension     Patient Active Problem List   Diagnosis Date Noted  . Cancer of anal canal ([place]) 07/29/2016  . 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 Asthma 08/17/2015  . Umbilical hernia without obstruction and without gangrene 02/27/2015  . 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 Routine general medical examination at a health care facility 03/15/2013  . 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 Shoulder pain 11/23/2012  . Right bundle branch block 11/11/2012  . Diabetes mellitus, controlled ([place])   . Hyperlipidemia   . Hypertension     Past Surgical History:  Procedure Laterality Date  . CATARACT EXTRACTION, BILATERAL    . CHOLECYSTECTOMY    . COLONOSCOPY  02/13/13   Dr Bary Castilla  . EPIGASTRIC HERNIA REPAIR N/A 03/20/2015   Procedure: HERNIA REPAIR EPIGASTRIC ADULT;  Surgeon: Robert Bellow, MD;  Location: [facility] ORS;   Service: General;  Laterality: N/A;  . HEMORRHOID SURGERY N/A 03/20/2015    FOCAL HIGH-GRADE SQUAMOUS INTRAEPITHELIAL LESION (HSIL, ANAL /HEMORRHOIDECTOMY;   Robert Bellow, MD [facility] ORS;  : General;  Laterality: N/A;  . HERNIA REPAIR  July 2016   Epigastric hernia, primary repair  . TONSILLECTOMY  age 9  . TOTAL ABDOMINAL HYSTERECTOMY  01/1989  . TUBAL LIGATION    . TUMOR EXCISION N/A 07/18/2016   Procedure: EXCISION RECTAL MASS;  Surgeon: Robert Bellow, MD;  Location: [facility] ORS;  Service: General;  Laterality: N/A;    Prior to Admission medications   Medication Sig Start Date End Date Taking? Authorizing Provider  acetaminophen (TYLENOL) 500 MG tablet Take 500-1,000 mg by mouth every 6 (six) hours as needed (for pain).   Yes Historical Provider, MD  albuterol (PROVENTIL HFA;VENTOLIN HFA) 108 (90 BASE) MCG/ACT inhaler Inhale 2 puffs into the lungs every 6 (six) hours as needed for wheezing. 03/14/13  Yes Crecencio Mc, MD  aspirin 81 MG chewable tablet Chew 81 mg by mouth daily.   Yes Historical Provider, MD  Carboxymeth-Glycerin-Polysorb (REFRESH OPTIVE ADVANCED OP) Place 1 drop into both eyes 3 (three) times daily as needed (for dry/irritated eyes).   Yes Historical Provider, MD  carvedilol (COREG) 6.25 MG tablet Take 1 tablet by mouth two  times daily with a meal 07/25/16  Yes Roselee Nova, MD  fluticasone (FLONASE) 50 MCG/ACT nasal spray Place 1  spray into both nostrils daily as needed (for allergies.).    Yes Historical Provider, MD  Fluticasone-Salmeterol (ADVAIR DISKUS) 100-50 MCG/DOSE AEPB Inhale 1 puff into the lungs daily. 08/17/15  Yes Roselee Nova, MD  HYDROcodone-acetaminophen (NORCO/VICODIN) 5-325 MG tablet Take 1 tablet by mouth every 6 (six) hours as needed for pain.   Yes Historical Provider, MD  lisinopril-hydrochlorothiazide (PRINZIDE,ZESTORETIC) 20-25 MG tablet Take 1 tablet by mouth daily. 07/25/16  Yes Roselee Nova, MD  metFORMIN (GLUCOPHAGE) 850 MG tablet  Take 1 tablet by mouth two  times daily with meals 01/09/16  Yes Roselee Nova, MD  Multiple Vitamin (MULTIVITAMIN) tablet Take 2 tablets by mouth daily.    Yes Historical Provider, MD  simvastatin (ZOCOR) 20 MG tablet TAKE 1 TABLET BY MOUTH AT  BEDTIME 07/15/16  Yes Roselee Nova, MD  trolamine salicylate (ASPERCREME) 10 % cream Apply 1 application topically 3 (three) times daily as needed (for arthritis pain/hip pain.).   Yes Historical Provider, MD    Allergies Hydrocodone  Family History  Problem Relation Age of Onset  . Bladder Cancer Mother   . Colon cancer Father   . Lung cancer Brother   . Lymphoma Sister     Social History Social History  Substance Use Topics  . Smoking status: Never Smoker  . Smokeless tobacco: Never Used  . Alcohol use No    Review of Systems Constitutional: No fever/chills Eyes: No visual changes. ENT: No sore throat. Cardiovascular: Denies chest pain. Respiratory: Denies shortness of breath. Gastrointestinal: No abdominal pain.  No nausea, no vomiting.  No diarrhea.  No constipation.Positive for rectal bleeding Genitourinary: Negative for dysuria. Musculoskeletal: Negative for back pain. Skin: Negative for rash. Neurological: Negative for headaches, focal weakness or numbness.  10-point ROS otherwise negative.  ____________________________________________   PHYSICAL EXAM:  VITAL SIGNS: ED Triage Vitals  Enc Vitals Group     BP 07/31/16 0423 130/79     Pulse Rate 07/31/16 0423 84     Resp 07/31/16 0423 14     Temp 07/31/16 0423 98 F (36.7 C)     Temp Source 07/31/16 0423 Oral     SpO2 07/31/16 0423 100 %     Weight 07/31/16 0426 121 lb (54.9 kg)     Height 07/31/16 0426 5\' 5"  (1.651 m)     Head Circumference --      Peak Flow --      Pain Score --      Pain Loc --      Pain Edu? --      Excl. in [place]? --     Constitutional: Alert and oriented. Well appearing and in no acute distress. Eyes: Conjunctivae are very pale.  PERRL. EOMI. Head: Atraumatic. Ears:  Healthy appearing ear canals and TMs bilaterally Nose: No congestion/rhinnorhea. Mouth/Throat: Mucous membranes are moist.  Oropharynx non-erythematous. Neck: No stridor.  No meningeal signs.  No cervical spine tenderness to palpation. Cardiovascular: Normal rate, regular rhythm. Good peripheral circulation. Grossly normal heart sounds. Respiratory: Normal respiratory effort.  No retractions. Lungs CTAB. Gastrointestinal: Soft and nontender. No distention. Quarter-sized clot noted in the patient's pad as well as active moderate bright red blood from the anus Musculoskeletal: No lower extremity tenderness nor edema. No gross deformities of extremities. Neurologic:  Normal speech and language. No gross focal neurologic deficits are appreciated.  Skin:  Skin is warm, dry and intact. No rash noted. Psychiatric: Mood and affect are normal. Speech and  behavior are normal.  ____________________________________________   LABS (all labs ordered are listed, but only abnormal results are displayed)  Labs Reviewed  CBC - Abnormal; Notable for the following:       Result Value   RBC 3.03 (*)    Hemoglobin 9.3 (*)    HCT 27.6 (*)    RDW 16.0 (*)    Platelets 81 (*)    All other components within normal limits  COMPREHENSIVE METABOLIC PANEL - Abnormal; Notable for the following:    Potassium 3.2 (*)    Chloride 100 (*)    Glucose, Bld 164 (*)    BUN 22 (*)    All other components within normal limits  PROTIME-INR  TYPE AND SCREEN     RADIOLOGY I, [place] N Myra Weng, personally viewed and evaluated these images (plain radiographs) as part of my medical decision making, as well as reviewing the written report by the radiologist.  No results found.   Procedures    ____________________________________________   INITIAL IMPRESSION / ASSESSMENT AND PLAN / ED COURSE  Pertinent labs & imaging results that were available during my care of the patient  were reviewed by me and considered in my medical decision making (see chart for details).  I was notified by the nursing staff that the patient had a large quantity of blood expressed from her anus and a such I presented to the room where multiple large clots were noted with approximately 50 ml of right red blood. I spoke with Dr. Tollie Pizza who presented to the emergency department and evaluated the patient   Clinical Course     ____________________________________________  FINAL CLINICAL IMPRESSION(S) / ED DIAGNOSES  Final diagnoses:  Acute GI bleeding     MEDICATIONS GIVEN DURING THIS VISIT:  Medications  iopamidol (ISOVUE-300) 61 % injection 30 mL (15 mLs Oral Contrast Given 07/31/16 0444)  hydrocortisone (ANUSOL-HC) suppository 25 mg (25 mg Rectal Given 07/31/16 0537)     NEW OUTPATIENT MEDICATIONS STARTED DURING THIS VISIT:  New Prescriptions   No medications on file    Modified Medications   No medications on file    Discontinued Medications   No medications on file     Note:  This document was prepared using Dragon voice recognition software and may include unintentional dictation errors.    Gregor Hams, MD 08/01/16 [phone]

## 2016-08-01 DIAGNOSIS — Z885 Allergy status to narcotic agent status: Secondary | ICD-10-CM | POA: Diagnosis not present

## 2016-08-01 DIAGNOSIS — H9192 Unspecified hearing loss, left ear: Secondary | ICD-10-CM | POA: Diagnosis not present

## 2016-08-01 DIAGNOSIS — E1122 Type 2 diabetes mellitus with diabetic chronic kidney disease: Secondary | ICD-10-CM | POA: Diagnosis not present

## 2016-08-01 DIAGNOSIS — I129 Hypertensive chronic kidney disease with stage 1 through stage 4 chronic kidney disease, or unspecified chronic kidney disease: Secondary | ICD-10-CM | POA: Diagnosis not present

## 2016-08-01 DIAGNOSIS — Z807 Family history of other malignant neoplasms of lymphoid, hematopoietic and related tissues: Secondary | ICD-10-CM | POA: Diagnosis not present

## 2016-08-01 DIAGNOSIS — J45909 Unspecified asthma, uncomplicated: Secondary | ICD-10-CM | POA: Diagnosis not present

## 2016-08-01 DIAGNOSIS — E785 Hyperlipidemia, unspecified: Secondary | ICD-10-CM | POA: Diagnosis not present

## 2016-08-01 DIAGNOSIS — K219 Gastro-esophageal reflux disease without esophagitis: Secondary | ICD-10-CM | POA: Diagnosis not present

## 2016-08-01 DIAGNOSIS — Z7982 Long term (current) use of aspirin: Secondary | ICD-10-CM | POA: Diagnosis not present

## 2016-08-01 DIAGNOSIS — N181 Chronic kidney disease, stage 1: Secondary | ICD-10-CM | POA: Diagnosis not present

## 2016-08-01 DIAGNOSIS — Z7951 Long term (current) use of inhaled steroids: Secondary | ICD-10-CM | POA: Diagnosis not present

## 2016-08-01 DIAGNOSIS — Z801 Family history of malignant neoplasm of trachea, bronchus and lung: Secondary | ICD-10-CM | POA: Diagnosis not present

## 2016-08-01 DIAGNOSIS — L7622 Postprocedural hemorrhage and hematoma of skin and subcutaneous tissue following other procedure: Secondary | ICD-10-CM | POA: Diagnosis not present

## 2016-08-01 DIAGNOSIS — Z9841 Cataract extraction status, right eye: Secondary | ICD-10-CM | POA: Diagnosis not present

## 2016-08-01 DIAGNOSIS — Z800 Family history of malignant neoplasm of digestive organs: Secondary | ICD-10-CM | POA: Diagnosis not present

## 2016-08-01 DIAGNOSIS — Z9842 Cataract extraction status, left eye: Secondary | ICD-10-CM | POA: Diagnosis not present

## 2016-08-01 DIAGNOSIS — Z8052 Family history of malignant neoplasm of bladder: Secondary | ICD-10-CM | POA: Diagnosis not present

## 2016-08-01 DIAGNOSIS — C44520 Squamous cell carcinoma of anal skin: Secondary | ICD-10-CM | POA: Diagnosis not present

## 2016-08-01 LAB — CBC
HCT: 27.2 % — ABNORMAL LOW (ref 35.0–47.0)
Hemoglobin: 9.2 g/dL — ABNORMAL LOW (ref 12.0–16.0)
MCH: 29 pg (ref 26.0–34.0)
MCHC: 33.9 g/dL (ref 32.0–36.0)
MCV: 85.6 fL (ref 80.0–100.0)
Platelets: 60 10*3/uL — ABNORMAL LOW (ref 150–440)
RBC: 3.18 MIL/uL — ABNORMAL LOW (ref 3.80–5.20)
RDW: 16.4 % — ABNORMAL HIGH (ref 11.5–14.5)
WBC: 6.6 10*3/uL (ref 3.6–11.0)

## 2016-08-01 NOTE — Care Management Important Message (Signed)
Important Message  Patient Details  Name: Julie Holland MRN: [id number] Date of Birth: [birth year]   Medicare Important Message Given:  Yes    Riata Ikeda A, RN 08/01/2016, 7:51 AM

## 2016-08-01 NOTE — Discharge Instructions (Signed)
Use walker for balance as needed.  Use shower spray for cleansing area.   No heavy lifting or straining.   No driving until free from any dizziness or unbalance.

## 2016-08-01 NOTE — Care Management Note (Signed)
Case Management Note  Patient Details  Name: BAXLEY TULLO MRN: [id number] Date of Birth: 1940-12-29  Subjective/Objective:    A request for a RW was called to [facility].                 Action/Plan:   Expected Discharge Date:                  Expected Discharge Plan:     In-House Referral:     Discharge planning Services     Post Acute Care Choice:    Choice offered to:     DME Arranged:    DME Agency:     HH Arranged:    HH Agency:     Status of Service:     If discussed at [employer] of Stay Meetings, dates discussed:    Additional Comments:  Margert Edsall A, RN 08/01/2016, 8:48 AM

## 2016-08-01 NOTE — Discharge Summary (Signed)
Physician Discharge Summary  Patient ID: Julie Holland MRN: [id number] DOB/AGE: 1940/09/15 75 y.o.  Admit date: 07/31/2016 Discharge date: 08/01/2016  Admission Diagnoses: Surgical site bleeding  Discharge Diagnoses:  Active Problems:   Rectal bleeding   Discharged Condition: good  Hospital Course: Patient was taken to the operating room the day of admission for oversewing of a bleeding arterial vessel at the site of previous mucosal resection. Postoperative course was unremarkable. Transfused 2 units packed red blood cells based on follow-up hemoglobin below 7. Tolerated without ill effect. Postoperative hemoglobin greater than 9.  Consults: None  Significant Diagnostic Studies: Surgery  Treatments: Transfusion  Discharge Exam: Blood pressure (!) 146/68, pulse 88, temperature 98.4 F (36.9 C), temperature source Oral, resp. rate 19, height 5\' 5"  (1.651 m), weight 129 lb 11.2 oz (58.8 kg), SpO2 99 %. Perianal wound without evidence of new bleeding.  Disposition: 01-Home or Self Care  Discharge Instructions    Diet - low sodium heart healthy    Complete by:  As directed    Discharge instructions    Complete by:  As directed    Resume your regular medications EXCEPT for lisinopril-hctz.  Regular diet.  Stool softener twice a day.   Increase activity slowly    Complete by:  As directed        Medication List    STOP taking these medications   lisinopril-hydrochlorothiazide 20-25 MG tablet Commonly known as:  PRINZIDE,ZESTORETIC     TAKE these medications   acetaminophen 500 MG tablet Commonly known as:  TYLENOL Take 500-1,000 mg by mouth every 6 (six) hours as needed (for pain).   albuterol 108 (90 Base) MCG/ACT inhaler Commonly known as:  PROVENTIL HFA;VENTOLIN HFA Inhale 2 puffs into the lungs every 6 (six) hours as needed for wheezing.   aspirin 81 MG chewable tablet Chew 81 mg by mouth daily.   carvedilol 6.25 MG tablet Commonly known as:   COREG Take 1 tablet by mouth two  times daily with a meal   fluticasone 50 MCG/ACT nasal spray Commonly known as:  FLONASE Place 1 spray into both nostrils daily as needed (for allergies.).   Fluticasone-Salmeterol 100-50 MCG/DOSE Aepb Commonly known as:  ADVAIR DISKUS Inhale 1 puff into the lungs daily.   HYDROcodone-acetaminophen 5-325 MG tablet Commonly known as:  NORCO/VICODIN Take 1 tablet by mouth every 6 (six) hours as needed for pain.   metFORMIN 850 MG tablet Commonly known as:  GLUCOPHAGE Take 1 tablet by mouth two  times daily with meals   multivitamin tablet Take 2 tablets by mouth daily.   REFRESH OPTIVE ADVANCED OP Place 1 drop into both eyes 3 (three) times daily as needed (for dry/irritated eyes).   simvastatin 20 MG tablet Commonly known as:  ZOCOR TAKE 1 TABLET BY MOUTH AT  BEDTIME   trolamine salicylate 10 % cream Commonly known as:  ASPERCREME Apply 1 application topically 3 (three) times daily as needed (for arthritis pain/hip pain.).      Follow-up Information    SKULSKIE, Billie Ruddy, MD. Schedule an appointment as soon as possible for a visit.   Specialty:  Gastroenterology Why:  IN ERROR: NO FOLLOW UP WITH DR. Gustavo Lah REQUIRED.  Contact information: [place] [address] [phone]        Robert Bellow, MD Follow up in 3 day(s).   Specialties:  General Surgery, Radiology Why:  10:30 AM on Monday, November 27.  Contact information: [address]  [place] [address] [phone]           Signed: Robert Bellow 08/01/2016, 8:32 AM

## 2016-08-01 NOTE — Progress Notes (Signed)
Pt A and O x 4. VSS. Pt tolerating diet well. No complaints of pain or nausea. IV removed intact, no new prescriptions given. Pt voiced understanding of discharge instructions with no further questions. Pt discharged via wheelchair with nurse. Pt received walker prior to discharge.

## 2016-08-01 NOTE — Care Management Note (Signed)
Case Management Note  Patient Details  Name: Julie Holland MRN: [id number] Date of Birth: 1940/10/25  Subjective/Objective:      Have called [facility] several times and was told that "Brad" the on-call hospital liaison would call me back. At 9:57am Brad called and will be at [facility] shortly to deliver the RW.               Action/Plan:   Expected Discharge Date:                  Expected Discharge Plan:     In-House Referral:     Discharge planning Services     Post Acute Care Choice:    Choice offered to:     DME Arranged:    DME Agency:     HH Arranged:    HH Agency:     Status of Service:     If discussed at [employer] of Stay Meetings, dates discussed:    Additional Comments:  [name] A, RN 08/01/2016, 9:54 AM

## 2016-08-02 LAB — TYPE AND SCREEN
ABO/RH(D): A POS
Antibody Screen: NEGATIVE
Unit division: 0
Unit division: 0

## 2016-08-04 ENCOUNTER — Ambulatory Visit: Payer: Medicare Other | Admitting: General Surgery

## 2016-08-04 ENCOUNTER — Encounter: Payer: Self-pay | Admitting: General Surgery

## 2016-08-04 VITALS — BP 138/78 | HR 83 | Resp 14 | Ht 65.0 in | Wt 123.0 lb

## 2016-08-04 DIAGNOSIS — C211 Malignant neoplasm of anal canal: Secondary | ICD-10-CM

## 2016-08-04 DIAGNOSIS — K91840 Postprocedural hemorrhage and hematoma of a digestive system organ or structure following a digestive system procedure: Secondary | ICD-10-CM | POA: Insufficient documentation

## 2016-08-04 NOTE — Patient Instructions (Signed)
Blood pressure check daily at home, if bottom number above 90 start Zestoretic Come in next week with your machine to check your blood pressure here.

## 2016-08-04 NOTE — Progress Notes (Signed)
Patient ID: Julie Holland, female   DOB: 05/19/1941, 75 y.o.   MRN: [id number]  Chief Complaint  Patient presents with  . Routine Post Op    HPI Julie Holland is a 75 y.o. female here following up from a hospital visit for rectal bleeding. She had an anal mass removal on 07/18/16 and developed rectal bleeding afterwards and was in the hospital on 07/31/16. She reports that she is doing well, just weak. She reports no bleeding at all in the last 2 days. She reports that her appetite is much improved.   The patient had bleeding develop on postoperative day 13 requiring transfusion of 2 units of packed red blood cells which she tolerated without ill effect.  Operative intervention with suture ligation of a bleeding arteriole at the superior aspect of the excision site was completed.  The patient denies any previous blood transfusions.  No history of multiple sexual partners or HIV exposure. HPI  Past Medical History:  Diagnosis Date  . Arthritis   . Asthma   . 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  . Chronic kidney disease    stage 1  . Diabetes mellitus    type II  . GERD (gastroesophageal reflux disease)    OCC  . Hemorrhoids   . Hyperlipidemia   . Hypertension     Past Surgical History:  Procedure Laterality Date  . CATARACT EXTRACTION, BILATERAL    . CHOLECYSTECTOMY    . COLONOSCOPY  02/13/13   Dr Bary Castilla  . EPIGASTRIC HERNIA REPAIR N/A 03/20/2015   Procedure: HERNIA REPAIR EPIGASTRIC ADULT;  Surgeon: Robert Bellow, MD;  Location: [facility] ORS;  Service: General;  Laterality: N/A;  . HEMORRHOID SURGERY N/A 03/20/2015    FOCAL HIGH-GRADE SQUAMOUS INTRAEPITHELIAL LESION (HSIL, ANAL /HEMORRHOIDECTOMY;   Robert Bellow, MD [facility] ORS;  : General;  Laterality: N/A;  . HERNIA REPAIR  July 2016   Epigastric hernia, primary repair  . TONSILLECTOMY  age 19  . TOTAL ABDOMINAL HYSTERECTOMY  01/1989  . TUBAL  LIGATION    . TUMOR EXCISION N/A 07/18/2016   Procedure: EXCISION RECTAL MASS;  Surgeon: Robert Bellow, MD;  Location: [facility] ORS;  Service: General;  Laterality: N/A;    Family History  Problem Relation Age of Onset  . Bladder Cancer Mother   . Colon cancer Father   . Lung cancer Brother   . Lymphoma Sister     Social History Social History  Substance Use Topics  . Smoking status: Never Smoker  . Smokeless tobacco: Never Used  . Alcohol use No    Allergies  Allergen Reactions  . Hydrocodone Nausea And Vomiting and Other (See Comments)    Vertigo but patient takes hydrocodone/acetaminophen outpatient    Current Outpatient Prescriptions  Medication Sig Dispense Refill  . acetaminophen (TYLENOL) 500 MG tablet Take 500-1,000 mg by mouth every 6 (six) hours as needed (for pain).    Marland Kitchen albuterol (PROVENTIL HFA;VENTOLIN HFA) 108 (90 BASE) MCG/ACT inhaler Inhale 2 puffs into the lungs every 6 (six) hours as needed for wheezing. 6.7 g 11  . aspirin 81 MG chewable tablet Chew 81 mg by mouth daily.    . Carboxymeth-Glycerin-Polysorb (REFRESH OPTIVE ADVANCED OP) Place 1 drop into both eyes 3 (three) times daily as needed (for dry/irritated eyes).    . carvedilol (COREG) 6.25 MG tablet Take 1 tablet by mouth two  times daily with a meal 180  tablet 0  . fluticasone (FLONASE) 50 MCG/ACT nasal spray Place 1 spray into both nostrils daily as needed (for allergies.).     Marland Kitchen Fluticasone-Salmeterol (ADVAIR DISKUS) 100-50 MCG/DOSE AEPB Inhale 1 puff into the lungs daily. 90 each 2  . lisinopril-hydrochlorothiazide (PRINZIDE,ZESTORETIC) 20-25 MG tablet Take 1 tablet by mouth daily.    . metFORMIN (GLUCOPHAGE) 850 MG tablet Take 1 tablet by mouth two  times daily with meals 180 tablet 2  . Multiple Vitamin (MULTIVITAMIN) tablet Take 2 tablets by mouth daily.     . simvastatin (ZOCOR) 20 MG tablet TAKE 1 TABLET BY MOUTH AT  BEDTIME 90 tablet 0  . trolamine salicylate (ASPERCREME) 10 % cream Apply 1  application topically 3 (three) times daily as needed (for arthritis pain/hip pain.).     No current facility-administered medications for this visit.     Review of Systems Review of Systems  Constitutional: Negative.   Respiratory: Negative.   Cardiovascular: Negative.   Gastrointestinal: Negative.     Blood pressure 138/78, pulse 83, resp. rate 14, height 5\' 5"  (1.651 m), weight 123 lb (55.8 kg).  Physical Exam Physical Exam  Constitutional: She is oriented to person, place, and time. She appears well-developed and well-nourished.  Genitourinary:     Neurological: She is alert and oriented to person, place, and time.  Skin: Skin is warm and dry.  Psychiatric: She has a normal mood and affect.      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    Plan    We'll plan for a follow-up blood pressure check in 1 week to determine if her Zestoretic needs to be restarted. She'll continue her Coreg. Follow-up examination in one month, earlier if any recurrent bleeding.  Patient was encouraged to eat red meat on a regular basis and make use of an over-the-counter iron supplement.      This has been scribed by Caryl-Lyn Otis Brace LPN   Robert Bellow 08/04/2016, 9:29 PM

## 2016-08-11 ENCOUNTER — Ambulatory Visit (INDEPENDENT_AMBULATORY_CARE_PROVIDER_SITE_OTHER): Payer: Medicare Other | Admitting: *Deleted

## 2016-08-11 ENCOUNTER — Encounter: Payer: Self-pay | Admitting: *Deleted

## 2016-08-11 VITALS — BP 147/80

## 2016-08-11 DIAGNOSIS — C211 Malignant neoplasm of anal canal: Secondary | ICD-10-CM

## 2016-08-11 NOTE — Progress Notes (Signed)
Patient came in for a blood pressure check, patient brought home BP machine. Blood pressures was is under new set of vitals.

## 2016-08-12 ENCOUNTER — Other Ambulatory Visit: Payer: Self-pay | Admitting: Family Medicine

## 2016-08-14 ENCOUNTER — Inpatient Hospital Stay: Payer: Medicare Other | Attending: General Surgery

## 2016-08-27 ENCOUNTER — Ambulatory Visit: Payer: Medicare Other | Admitting: Family Medicine

## 2016-09-04 ENCOUNTER — Ambulatory Visit (INDEPENDENT_AMBULATORY_CARE_PROVIDER_SITE_OTHER): Payer: Medicare Other | Admitting: Family Medicine

## 2016-09-04 ENCOUNTER — Encounter: Payer: Self-pay | Admitting: Family Medicine

## 2016-09-04 VITALS — BP 137/72 | HR 87 | Temp 98.1°F | Resp 16 | Ht 65.0 in | Wt 124.7 lb

## 2016-09-04 DIAGNOSIS — E119 Type 2 diabetes mellitus without complications: Secondary | ICD-10-CM

## 2016-09-04 DIAGNOSIS — E785 Hyperlipidemia, unspecified: Secondary | ICD-10-CM | POA: Diagnosis not present

## 2016-09-04 DIAGNOSIS — I10 Essential (primary) hypertension: Secondary | ICD-10-CM

## 2016-09-04 LAB — POCT GLYCOSYLATED HEMOGLOBIN (HGB A1C): Hemoglobin A1C: 6

## 2016-09-04 MED ORDER — CARVEDILOL 6.25 MG PO TABS: ORAL_TABLET | ORAL | 0 refills | Status: DC

## 2016-09-04 MED ORDER — SIMVASTATIN 20 MG PO TABS: 20.0000 mg | ORAL_TABLET | Freq: Every day | ORAL | 0 refills | Status: DC

## 2016-09-04 NOTE — Progress Notes (Signed)
Name: Julie Holland   MRN: [id number]    DOB: 1941-07-04   Date:09/04/2016       Progress Note  Subjective  Chief Complaint  Chief Complaint  Patient presents with  . Follow-up    3 mo  . Labs Only    Diabetes  She presents for her follow-up diabetic visit. She has type 2 diabetes mellitus. Her disease course has been stable. Pertinent negatives for hypoglycemia include no headaches. Associated symptoms include fatigue. Pertinent negatives for diabetes include no blurred vision, no chest pain, no foot paresthesias, no polydipsia and no polyuria. Symptoms are stable. There are no diabetic complications. Pertinent negatives for diabetic complications include no CVA or heart disease. Current diabetic treatment includes oral agent (monotherapy). She is following a diabetic diet. Her breakfast blood glucose range is generally 90-110 mg/dl. An ACE inhibitor/angiotensin II receptor blocker is being taken. Eye exam is current.  Hypertension  This is a chronic problem. The problem is unchanged. The problem is controlled. Pertinent negatives include no blurred vision, chest pain, headaches, palpitations or shortness of breath. Past treatments include ACE inhibitors, diuretics and beta blockers. There is no history of kidney disease, CAD/MI or CVA.  Hyperlipidemia  This is a chronic problem. The problem is controlled. Recent lipid tests were reviewed and are normal. Pertinent negatives include no chest pain, myalgias or shortness of breath. Current antihyperlipidemic treatment includes statins.     Past Medical History:  Diagnosis Date  . Arthritis   . Asthma   . 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  . Chronic kidney disease    stage 1  . Diabetes mellitus    type II  . GERD (gastroesophageal reflux disease)    OCC  . Hemorrhoids   . Hyperlipidemia   . Hypertension     Past Surgical History:  Procedure Laterality Date   . CATARACT EXTRACTION, BILATERAL    . CHOLECYSTECTOMY    . COLONOSCOPY  02/13/13   Dr Bary Castilla  . EPIGASTRIC HERNIA REPAIR N/A 03/20/2015   Procedure: HERNIA REPAIR EPIGASTRIC ADULT;  Surgeon: Robert Bellow, MD;  Location: [facility] ORS;  Service: General;  Laterality: N/A;  . HEMORRHOID SURGERY N/A 03/20/2015    FOCAL HIGH-GRADE SQUAMOUS INTRAEPITHELIAL LESION (HSIL, ANAL /HEMORRHOIDECTOMY;   Robert Bellow, MD [facility] OR[facility];  : General;  Laterality: N/A;  . HERNIA REPAIR  July 2016   Epigastric hernia, primary repair  . TONSILLECTOMY  age 31  . TOTAL ABDOMINAL HYSTERECTOMY  01/1989  . TUBAL LIGATION    . TUMOR EXCISION N/A 07/18/2016   Procedure: EXCISION RECTAL MASS;  Surgeon: Robert Bellow, MD;  Location: [facility] ORS;  Service: General;  Laterality: N/A;    Family History  Problem Relation Age of Onset  . Bladder Cancer Mother   . Colon cancer Father   . Lung cancer Brother   . Lymphoma Sister     Social History   Social History  . Marital status: Married    Spouse name: N/A  . Number of children: N/A  . Years of education: N/A   Occupational History  . Not on file.   Social History Main Topics  . Smoking status: Never Smoker  . Smokeless tobacco: Never Used  . Alcohol use No  . Drug use: No  . Sexual activity: Yes    Birth control/ protection: Surgical   Other Topics Concern  . Not on file   Social  History Narrative  . No narrative on file     Current Outpatient Prescriptions:  .  ACCU-CHEK SMARTVIEW test strip, USE AS DIRECTED, Disp: 100 each, Rfl: 2 .  acetaminophen (TYLENOL) 500 MG tablet, Take 500-1,000 mg by mouth every 6 (six) hours as needed (for pain)., Disp: , Rfl:  .  albuterol (PROVENTIL HFA;VENTOLIN HFA) 108 (90 BASE) MCG/ACT inhaler, Inhale 2 puffs into the lungs every 6 (six) hours as needed for wheezing., Disp: 6.7 g, Rfl: 11 .  aspirin 81 MG chewable tablet, Chew 81 mg by mouth daily., Disp: , Rfl:  .  Carboxymeth-Glycerin-Polysorb (REFRESH  OPTIVE ADVANCED OP), Place 1 drop into both eyes 3 (three) times daily as needed (for dry/irritated eyes)., Disp: , Rfl:  .  carvedilol (COREG) 6.25 MG tablet, Take 1 tablet by mouth two  times daily with a meal, Disp: 180 tablet, Rfl: 0 .  fluticasone (FLONASE) 50 MCG/ACT nasal spray, Place 1 spray into both nostrils daily as needed (for allergies.). , Disp: , Rfl:  .  Fluticasone-Salmeterol (ADVAIR DISKUS) 100-50 MCG/DOSE AEPB, Inhale 1 puff into the lungs daily., Disp: 90 each, Rfl: 2 .  lisinopril-hydrochlorothiazide (PRINZIDE,ZESTORETIC) 20-25 MG tablet, Take 1 tablet by mouth daily., Disp: , Rfl:  .  metFORMIN (GLUCOPHAGE) 850 MG tablet, Take 1 tablet by mouth two  times daily with meals, Disp: 180 tablet, Rfl: 2 .  Multiple Vitamin (MULTIVITAMIN) tablet, Take 2 tablets by mouth daily. , Disp: , Rfl:  .  simvastatin (ZOCOR) 20 MG tablet, TAKE 1 TABLET BY MOUTH AT  BEDTIME, Disp: 90 tablet, Rfl: 0 .  trolamine salicylate (ASPERCREME) 10 % cream, Apply 1 application topically 3 (three) times daily as needed (for arthritis pain/hip pain.)., Disp: , Rfl:   Allergies  Allergen Reactions  . Hydrocodone Nausea And Vomiting and Other (See Comments)    Vertigo but patient takes hydrocodone/acetaminophen outpatient     Review of Systems  Constitutional: Positive for fatigue.  Eyes: Negative for blurred vision.  Respiratory: Negative for shortness of breath.   Cardiovascular: Negative for chest pain and palpitations.  Musculoskeletal: Negative for myalgias.  Neurological: Negative for headaches.  Endo/Heme/Allergies: Negative for polydipsia.     Objective  Vitals:   09/04/16 1353  BP: 137/72  Pulse: 87  Resp: 16  Temp: 98.1 F (36.7 C)  TempSrc: Oral  SpO2: 96%  Weight: 124 lb 11.2 oz (56.6 kg)  Height: 5\' 5"  (1.651 m)    Physical Exam  Constitutional: She is oriented to person, place, and time and well-developed, well-nourished, and in no distress.  HENT:  Head:  Normocephalic and atraumatic.  Cardiovascular: Normal rate, regular rhythm and normal heart sounds.   No murmur heard. Pulmonary/Chest: Effort normal and breath sounds normal. She has no wheezes.  Abdominal: Soft. Bowel sounds are normal. There is no tenderness.  Musculoskeletal: She exhibits no edema.       Right ankle: She exhibits no swelling.       Left ankle: She exhibits no swelling.  Neurological: She is alert and oriented to person, place, and time.  Psychiatric: Mood, memory, affect and judgment normal.  Nursing note and vitals reviewed.      Assessment & Plan  1. Essential hypertension  - carvedilol (COREG) 6.25 MG tablet; Take 1 tablet by mouth two  times daily with a meal  Dispense: 180 tablet; Refill: 0  2. Hyperlipidemia, unspecified hyperlipidemia type  - simvastatin (ZOCOR) 20 MG tablet; Take 1 tablet (20 mg total) by mouth at  bedtime.  Dispense: 90 tablet; Refill: 0 - Lipid Profile  3. Controlled type 2 diabetes mellitus without complication, without long-term current use of insulin (HCC) Point-of-care A1c 6.0%, well-controlled diabetes. No change in pharmacotherapy - POCT HgB A1C   Laird Runnion [facility] 09/04/2016 2:26 PM

## 2016-09-08 DIAGNOSIS — H409 Unspecified glaucoma: Secondary | ICD-10-CM

## 2016-09-08 HISTORY — DX: Unspecified glaucoma: H40.9

## 2016-09-09 ENCOUNTER — Encounter: Payer: Self-pay | Admitting: General Surgery

## 2016-09-09 ENCOUNTER — Other Ambulatory Visit: Payer: Self-pay | Admitting: Family Medicine

## 2016-09-09 ENCOUNTER — Ambulatory Visit (INDEPENDENT_AMBULATORY_CARE_PROVIDER_SITE_OTHER): Payer: Medicare Other | Admitting: General Surgery

## 2016-09-09 VITALS — BP 134/78 | HR 74 | Resp 14 | Ht 65.0 in | Wt 120.0 lb

## 2016-09-09 DIAGNOSIS — J4540 Moderate persistent asthma, uncomplicated: Secondary | ICD-10-CM

## 2016-09-09 DIAGNOSIS — C211 Malignant neoplasm of anal canal: Secondary | ICD-10-CM

## 2016-09-09 NOTE — Progress Notes (Signed)
Patient ID: Julie Holland, female   DOB: [birth year], 76 y.o.   MRN: [id number]  Chief Complaint  Patient presents with  . Follow-up    HPI Julie Holland is a 76 y.o. female here today for her follow up rectal surgery done 07/31/2016. Patient states she is doing well. No bleeding or pain.  HPI  Past Medical History:  Diagnosis Date  . Arthritis   . Asthma   . 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  . Chronic kidney disease    stage 1  . Diabetes mellitus    type II  . GERD (gastroesophageal reflux disease)    OCC  . Hemorrhoids   . Hyperlipidemia   . Hypertension     Past Surgical History:  Procedure Laterality Date  . CATARACT EXTRACTION, BILATERAL    . CHOLECYSTECTOMY    . COLONOSCOPY  02/13/13   Dr Bary Castilla  . EPIGASTRIC HERNIA REPAIR N/A 03/20/2015   Procedure: HERNIA REPAIR EPIGASTRIC ADULT;  Surgeon: Robert Bellow, MD;  Location: [facility] ORS;  Service: General;  Laterality: N/A;  . HEMORRHOID SURGERY N/A 03/20/2015    FOCAL HIGH-GRADE SQUAMOUS INTRAEPITHELIAL LESION (HSIL, ANAL /HEMORRHOIDECTOMY;   Robert Bellow, MD [facility] ORS;  : General;  Laterality: N/A;  . HERNIA REPAIR  July 2016   Epigastric hernia, primary repair  . TONSILLECTOMY  age 30  . TOTAL ABDOMINAL HYSTERECTOMY  01/1989  . TUBAL LIGATION    . TUMOR EXCISION N/A 07/18/2016   Procedure: EXCISION RECTAL MASS;  Surgeon: Robert Bellow, MD;  Location: [facility] ORS;  Service: General;  Laterality: N/A;    Family History  Problem Relation Age of Onset  . Bladder Cancer Mother   . Colon cancer Father   . Lung cancer Brother   . Lymphoma Sister     Social History Social History  Substance Use Topics  . Smoking status: Never Smoker  . Smokeless tobacco: Never Used  . Alcohol use No    Allergies  Allergen Reactions  . Hydrocodone Nausea And Vomiting and Other (See Comments)    Vertigo but patient takes hydrocodone/acetaminophen  outpatient    Current Outpatient Prescriptions  Medication Sig Dispense Refill  . ACCU-CHEK SMARTVIEW test strip USE AS DIRECTED 100 each 2  . acetaminophen (TYLENOL) 500 MG tablet Take 500-1,000 mg by mouth every 6 (six) hours as needed (for pain).    Marland Kitchen albuterol (PROVENTIL HFA;VENTOLIN HFA) 108 (90 BASE) MCG/ACT inhaler Inhale 2 puffs into the lungs every 6 (six) hours as needed for wheezing. 6.7 g 11  . aspirin 81 MG chewable tablet Chew 81 mg by mouth daily.    . Carboxymeth-Glycerin-Polysorb (REFRESH OPTIVE ADVANCED OP) Place 1 drop into both eyes 3 (three) times daily as needed (for dry/irritated eyes).    . carvedilol (COREG) 6.25 MG tablet Take 1 tablet by mouth two  times daily with a meal 180 tablet 0  . fluticasone (FLONASE) 50 MCG/ACT nasal spray Place 1 spray into both nostrils daily as needed (for allergies.).     Marland Kitchen Fluticasone-Salmeterol (ADVAIR DISKUS) 100-50 MCG/DOSE AEPB Inhale 1 puff into the lungs daily. 90 each 2  . lisinopril-hydrochlorothiazide (PRINZIDE,ZESTORETIC) 20-25 MG tablet Take 1 tablet by mouth daily.    . metFORMIN (GLUCOPHAGE) 850 MG tablet Take 1 tablet by mouth two  times daily with meals 180 tablet 2  . Multiple Vitamin (MULTIVITAMIN) tablet Take 2 tablets by mouth daily.     Marland Kitchen  simvastatin (ZOCOR) 20 MG tablet Take 1 tablet (20 mg total) by mouth at bedtime. 90 tablet 0  . trolamine salicylate (ASPERCREME) 10 % cream Apply 1 application topically 3 (three) times daily as needed (for arthritis pain/hip pain.).     No current facility-administered medications for this visit.     Review of Systems Review of Systems  Constitutional: Negative.   Respiratory: Negative.   Cardiovascular: Negative.     Blood pressure 134/78, pulse 74, resp. rate 14, height 5\' 5"  (1.651 m), weight 120 lb (54.4 kg).  Physical Exam Physical Exam  Constitutional: She is oriented to person, place, and time. She appears well-developed and well-nourished.  Genitourinary:      Neurological: She is alert and oriented to person, place, and time.  Skin: Skin is warm and dry.    Data Reviewed A. RECTUM MASS; EXCISION:  - FOCAL INVASIVE SQUAMOUS CELL CARCINOMA OF THE ANAL CANAL ARISING IN A  BACKGROUND OF HIGH-GRADE SQUAMOUS INTRAEPITHELIAL LESION (HSIL).  - HSIL INVOLVES A PERIPHERAL MARGIN.  - DISTAL ANAL MARGIN, RECTAL MARGIN, AND DEEP MARGIN ARE CLEAR BASED ON  SEQUENTIALLY SUBMITTED SECTIONS.   Assessment    Doing well status post excision of focal squamous cell carcinoma.    Plan    The patient's case has been presented at the [facility] tumor board. No indication for additional treatment outside of serial exams.  Patient's blood pressure remains normal off treatment. We'll have her recheck in 2 weeks. Return in two week blood presser check with nurse.    Patient to return in six months.   This information has been scribed by Gaspar Cola CMA.  Robert Bellow 09/10/2016, 2:37 PM

## 2016-09-09 NOTE — Patient Instructions (Addendum)
Return in two week blood presser check with nurse and six months doctor.

## 2016-09-11 DIAGNOSIS — Z01419 Encounter for gynecological examination (general) (routine) without abnormal findings: Secondary | ICD-10-CM | POA: Diagnosis not present

## 2016-09-12 ENCOUNTER — Other Ambulatory Visit: Payer: Self-pay | Admitting: Obstetrics & Gynecology

## 2016-09-12 DIAGNOSIS — Z1231 Encounter for screening mammogram for malignant neoplasm of breast: Secondary | ICD-10-CM

## 2016-09-19 ENCOUNTER — Other Ambulatory Visit: Payer: Self-pay | Admitting: Family Medicine

## 2016-09-19 DIAGNOSIS — E785 Hyperlipidemia, unspecified: Secondary | ICD-10-CM | POA: Diagnosis not present

## 2016-09-19 LAB — LIPID PANEL
Cholesterol: 100 mg/dL (ref <200)
HDL: 44 mg/dL — ABNORMAL LOW (ref >50)
LDL Cholesterol: 46 mg/dL (ref <100)
Total CHOL/HDL Ratio: 2.3 Ratio (ref <5.0)
Triglycerides: 52 mg/dL (ref <150)
VLDL: 10 mg/dL (ref <30)

## 2016-09-22 ENCOUNTER — Ambulatory Visit (INDEPENDENT_AMBULATORY_CARE_PROVIDER_SITE_OTHER): Payer: Medicare Other

## 2016-09-22 VITALS — BP 138/72

## 2016-09-22 DIAGNOSIS — C211 Malignant neoplasm of anal canal: Secondary | ICD-10-CM

## 2016-09-22 NOTE — Progress Notes (Signed)
Patient ID: Julie Holland, female   DOB: 05/01/1941, 76 y.o.   MRN: [id number] Patient here for a two week follow up for blood pressure check. Blood pressure taken in both arms while sitting. Patient to follow up in July.

## 2016-10-06 ENCOUNTER — Telehealth: Payer: Self-pay | Admitting: General Surgery

## 2016-10-06 ENCOUNTER — Ambulatory Visit (INDEPENDENT_AMBULATORY_CARE_PROVIDER_SITE_OTHER): Payer: Medicare Other | Admitting: *Deleted

## 2016-10-06 ENCOUNTER — Other Ambulatory Visit: Payer: Self-pay

## 2016-10-06 VITALS — BP 140/80

## 2016-10-06 DIAGNOSIS — D649 Anemia, unspecified: Secondary | ICD-10-CM

## 2016-10-06 DIAGNOSIS — C211 Malignant neoplasm of anal canal: Secondary | ICD-10-CM

## 2016-10-06 DIAGNOSIS — I10 Essential (primary) hypertension: Secondary | ICD-10-CM

## 2016-10-06 NOTE — Progress Notes (Signed)
Patient came in today for blood presser check. 140/80 Right Arm, Patient Position: Sitting, Cuff Size: Normal) weight was 118 lb . She states she has been dropping weight lately.

## 2016-10-06 NOTE — Telephone Encounter (Signed)
The patient continues on Coreg alone, having stopped lisinopril/ HCTZ after her November 2017 hospitalization with bleeding post excision of the anal squamous cell disease.  Weight is down 3 pounds from October 2017 exam.   Reports she is exercising, but that her strength has been slower to return than she desired..  No dietary intolerance, no bleeding.  Will arrange for a repeat CBC and OV in two weeks.  In the interim, she will continue on Coreg alone.

## 2016-10-14 DIAGNOSIS — C211 Malignant neoplasm of anal canal: Secondary | ICD-10-CM | POA: Diagnosis not present

## 2016-10-15 ENCOUNTER — Ambulatory Visit
Admission: RE | Admit: 2016-10-15 | Discharge: 2016-10-15 | Disposition: A | Discharge status: Home / Self Care | Payer: Medicare Other | Source: Ambulatory Visit | Attending: Obstetrics & Gynecology | Admitting: Obstetrics & Gynecology

## 2016-10-15 DIAGNOSIS — Z1231 Encounter for screening mammogram for malignant neoplasm of breast: Secondary | ICD-10-CM | POA: Diagnosis not present

## 2016-10-15 LAB — CBC WITH DIFFERENTIAL/PLATELET
Basophils Absolute: 0 10*3/uL (ref 0.0–0.2)
Basos: 0 %
EOS (ABSOLUTE): 0 10*3/uL (ref 0.0–0.4)
Eos: 1 %
Hematocrit: 39.4 % (ref 34.0–46.6)
Hemoglobin: 12.3 g/dL (ref 11.1–15.9)
Immature Grans (Abs): 0 10*3/uL (ref 0.0–0.1)
Immature Granulocytes: 0 %
Lymphocytes Absolute: 0.8 10*3/uL (ref 0.7–3.1)
Lymphs: 34 %
MCH: 28.7 pg (ref 26.6–33.0)
MCHC: 31.2 g/dL — ABNORMAL LOW (ref 31.5–35.7)
MCV: 92 fL (ref 79–97)
Monocytes Absolute: 0.5 10*3/uL (ref 0.1–0.9)
Monocytes: 22 %
Neutrophils Absolute: 1.1 10*3/uL — ABNORMAL LOW (ref 1.4–7.0)
Neutrophils: 43 %
Platelets: 90 10*3/uL — CL (ref 150–379)
RBC: 4.28 x10E6/uL (ref 3.77–5.28)
RDW: 16 % — ABNORMAL HIGH (ref 12.3–15.4)
WBC: 2.5 10*3/uL — CL (ref 3.4–10.8)

## 2016-10-15 LAB — FERRITIN: Ferritin: 47 ng/mL (ref 15–150)

## 2016-10-15 LAB — BASIC METABOLIC PANEL
BUN/Creatinine Ratio: 21 (ref 12–28)
BUN: 12 mg/dL (ref 8–27)
CO2: 29 mmol/L (ref 18–29)
Calcium: 9.3 mg/dL (ref 8.7–10.3)
Chloride: 93 mmol/L — ABNORMAL LOW (ref 96–106)
Creatinine, Ser: 0.56 mg/dL — ABNORMAL LOW (ref 0.57–1.00)
GFR calc Af Amer: 104 mL/min/{1.73_m2} (ref >59)
GFR calc non Af Amer: 90 mL/min/{1.73_m2} (ref >59)
Glucose: 213 mg/dL — ABNORMAL HIGH (ref 65–99)
Potassium: 3.9 mmol/L (ref 3.5–5.2)
Sodium: 136 mmol/L (ref 134–144)

## 2016-10-20 ENCOUNTER — Telehealth: Payer: Self-pay | Admitting: *Deleted

## 2016-10-20 ENCOUNTER — Encounter: Payer: Self-pay | Admitting: General Surgery

## 2016-10-20 ENCOUNTER — Ambulatory Visit (INDEPENDENT_AMBULATORY_CARE_PROVIDER_SITE_OTHER): Payer: Medicare Other | Admitting: General Surgery

## 2016-10-20 VITALS — BP 132/68 | HR 66 | Resp 14 | Ht 63.0 in | Wt 119.0 lb

## 2016-10-20 DIAGNOSIS — D508 Other iron deficiency anemias: Secondary | ICD-10-CM | POA: Diagnosis not present

## 2016-10-20 DIAGNOSIS — D696 Thrombocytopenia, unspecified: Secondary | ICD-10-CM | POA: Diagnosis not present

## 2016-10-20 DIAGNOSIS — D709 Neutropenia, unspecified: Secondary | ICD-10-CM | POA: Diagnosis not present

## 2016-10-20 NOTE — Patient Instructions (Signed)
Advised patient to see hematologist. Follow up November  2018.

## 2016-10-20 NOTE — Progress Notes (Signed)
Patient ID: Julie Holland, female   DOB: 11-Oct-1940, 76 y.o.   MRN: [id number]  Chief Complaint  Patient presents with  . Follow-up    HPI Julie Holland is a 76 y.o. female here today for follow up. Patient had her labs done on 10/14/16. She states she is feeling much better and started taking an iron supplement. Patient states she does not have any rectal bleeding. No history of bleeding from her gums or subcutaneous bleeding after minor trauma. Moving bowels regularly and denies pain.   No past history of evaluation for low white blood cell count or low platelet count.  She did make use of 45 mg iron supplements as requested, now making use of 27 mg supplements. HPI  Past Medical History:  Diagnosis Date  . Arthritis   . Asthma   . 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  . Chronic kidney disease    stage 1  . Diabetes mellitus    type II  . GERD (gastroesophageal reflux disease)    OCC  . Hemorrhoids   . Hyperlipidemia   . Hypertension     Past Surgical History:  Procedure Laterality Date  . CATARACT EXTRACTION, BILATERAL    . CHOLECYSTECTOMY    . COLONOSCOPY  02/13/13   Dr Bary Castilla  . EPIGASTRIC HERNIA REPAIR N/A 03/20/2015   Procedure: HERNIA REPAIR EPIGASTRIC ADULT;  Surgeon: Robert Bellow, MD;  Location: [facility] ORS;  Service: General;  Laterality: N/A;  . HEMORRHOID SURGERY N/A 03/20/2015    FOCAL HIGH-GRADE SQUAMOUS INTRAEPITHELIAL LESION (HSIL, ANAL /HEMORRHOIDECTOMY;   Robert Bellow, MD [facility] ORS;  : General;  Laterality: N/A;  . HERNIA REPAIR  July 2016   Epigastric hernia, primary repair  . TONSILLECTOMY  age 50  . TOTAL ABDOMINAL HYSTERECTOMY  01/1989  . TUBAL LIGATION    . TUMOR EXCISION N/A 07/18/2016   Procedure: EXCISION RECTAL MASS;  Surgeon: Robert Bellow, MD;  Location: [facility] ORS;  Service: General;  Laterality: N/A;    Family History  Problem Relation Age of Onset  . Bladder  Cancer Mother   . Colon cancer Father   . Lung cancer Brother   . Lymphoma Sister     Social History Social History  Substance Use Topics  . Smoking status: Never Smoker  . Smokeless tobacco: Never Used  . Alcohol use No    Allergies  Allergen Reactions  . Hydrocodone Nausea And Vomiting and Other (See Comments)    Vertigo but patient takes hydrocodone/acetaminophen outpatient    Current Outpatient Prescriptions  Medication Sig Dispense Refill  . ACCU-CHEK SMARTVIEW test strip USE AS DIRECTED 100 each 2  . acetaminophen (TYLENOL) 500 MG tablet Take 500-1,000 mg by mouth every 6 (six) hours as needed (for pain).    . ADVAIR DISKUS 100-50 MCG/DOSE AEPB USE 1 INHALATION INTO THE  LUNGS DAILY 14 each 0  . albuterol (PROVENTIL HFA;VENTOLIN HFA) 108 (90 BASE) MCG/ACT inhaler Inhale 2 puffs into the lungs every 6 (six) hours as needed for wheezing. 6.7 g 11  . aspirin 81 MG chewable tablet Chew 81 mg by mouth daily.    . Carboxymeth-Glycerin-Polysorb (REFRESH OPTIVE ADVANCED OP) Place 1 drop into both eyes 3 (three) times daily as needed (for dry/irritated eyes).    . carvedilol (COREG) 6.25 MG tablet Take 1 tablet by mouth two  times daily with a meal 180 tablet 0  . Ferrous Gluconate (  IRON 27 PO) Take 27 mg by mouth.    . fluticasone (FLONASE) 50 MCG/ACT nasal spray Place 1 spray into both nostrils daily as needed (for allergies.).     Marland Kitchen metFORMIN (GLUCOPHAGE) 850 MG tablet Take 1 tablet by mouth two  times daily with meals 180 tablet 2  . Multiple Vitamin (MULTIVITAMIN) tablet Take 2 tablets by mouth daily.     . simvastatin (ZOCOR) 20 MG tablet Take 1 tablet (20 mg total) by mouth at bedtime. 90 tablet 0  . trolamine salicylate (ASPERCREME) 10 % cream Apply 1 application topically 3 (three) times daily as needed (for arthritis pain/hip pain.).     No current facility-administered medications for this visit.     Review of Systems Review of Systems  Constitutional: Negative.    Respiratory: Negative.   Cardiovascular: Negative.     Blood pressure 132/68, pulse 66, resp. rate 14, height 5\' 3"  (1.6 m), weight 119 lb (54 kg).  Physical Exam Physical Exam  Constitutional: She is oriented to person, place, and time. She appears well-developed.  Eyes: Conjunctivae are normal. No scleral icterus.  Neurological: She is alert and oriented to person, place, and time.  Skin: Skin is warm and dry.    Data Reviewed Laboratory studies dated 10/14/2016 were reviewed. Ferritin level is normal at 47. CBC shows hemoglobin improved to 12.3 with an MCV of 92. She has a low white blood cell count 2500 with 43% neutrophils, 34% lymphocytes and 22% monocytes. Platelet count is 90,000.  Review of laboratory studies through 2016-2017 showed platelet counts running between 60 and 84,000.  CBC from 05/08/2011 showed a white blood cell count of 3000 with a normal differential. Platelet count 165,000. CBC from 10/17/2011 showed a white blood cell count of 5400, differential, platelet count 193,000 CBC from 05/28/2013 showed white blood cell count 4300, no differential. Platelet count 120,000. CBC from 07/20/2013 showed a white blood cell count of 2600, normal differential, 19 % monocytes. Platelet count: 128,000. CBC from 08/01/2013 showed a white blood cell count of 4000 with the monocytes 18%, platelet count 213,000.  Assessment    Resolution of postop anemia with iron therapy.  Waxing and waning thrombocytopenia/leukopenia.    Plan    Advised patient to see hematologist. Ended up appointment with Nolon Stalls, M.D. on February 22. Continue to take 27 mg of Iron Supplement.  Follow up November 2018.        Robert Bellow 10/20/2016, 12:08 PM

## 2016-10-20 NOTE — Telephone Encounter (Signed)
Patient called the office back and was notified of appointment. She verbalizes understanding.

## 2016-10-20 NOTE — Telephone Encounter (Signed)
Message left for patient to call the office.   We need to inform patient of her upcoming appointment with Dr. Mike Gip.   This has been arranged for 10-30-16 at 3:30 pm at the [facility].

## 2016-10-22 ENCOUNTER — Other Ambulatory Visit: Payer: Self-pay | Admitting: *Deleted

## 2016-10-22 ENCOUNTER — Inpatient Hospital Stay
Admission: RE | Admit: 2016-10-22 | Discharge: 2016-10-22 | Disposition: A | Discharge status: Home / Self Care | Payer: Self-pay | Source: Ambulatory Visit | Attending: *Deleted | Admitting: *Deleted

## 2016-10-22 DIAGNOSIS — Z1231 Encounter for screening mammogram for malignant neoplasm of breast: Secondary | ICD-10-CM

## 2016-10-26 ENCOUNTER — Other Ambulatory Visit: Payer: Self-pay | Admitting: Family Medicine

## 2016-10-27 ENCOUNTER — Telehealth: Payer: Self-pay

## 2016-10-27 ENCOUNTER — Telehealth: Payer: Self-pay | Admitting: Family Medicine

## 2016-10-27 DIAGNOSIS — E785 Hyperlipidemia, unspecified: Secondary | ICD-10-CM

## 2016-10-27 DIAGNOSIS — E119 Type 2 diabetes mellitus without complications: Secondary | ICD-10-CM

## 2016-10-27 MED ORDER — ACCU-CHEK SMARTVIEW VI STRP: ORAL_STRIP | 2 refills | Status: DC

## 2016-10-27 MED ORDER — SIMVASTATIN 20 MG PO TABS: 20.0000 mg | ORAL_TABLET | Freq: Every day | ORAL | 0 refills | Status: DC

## 2016-10-27 NOTE — Telephone Encounter (Signed)
Patient verbally informed °

## 2016-10-27 NOTE — Telephone Encounter (Signed)
Requesting refill on accu chek smartview test strips. Please send to [facility]

## 2016-10-27 NOTE — Telephone Encounter (Signed)
Medication has been refilled and sent to [facility] 

## 2016-10-27 NOTE — Telephone Encounter (Signed)
Prescription for test strips has been sent to patient's pharmacy 

## 2016-10-30 ENCOUNTER — Encounter: Payer: Self-pay | Admitting: Hematology and Oncology

## 2016-10-30 ENCOUNTER — Telehealth: Payer: Self-pay | Admitting: *Deleted

## 2016-10-30 ENCOUNTER — Inpatient Hospital Stay: Payer: Medicare Other | Attending: Hematology and Oncology | Admitting: Hematology and Oncology

## 2016-10-30 VITALS — BP 171/84 | HR 82 | Temp 99.2°F | Resp 18 | Ht 63.0 in | Wt 119.5 lb

## 2016-10-30 DIAGNOSIS — E1122 Type 2 diabetes mellitus with diabetic chronic kidney disease: Secondary | ICD-10-CM | POA: Diagnosis not present

## 2016-10-30 DIAGNOSIS — Z7984 Long term (current) use of oral hypoglycemic drugs: Secondary | ICD-10-CM | POA: Insufficient documentation

## 2016-10-30 DIAGNOSIS — Z7982 Long term (current) use of aspirin: Secondary | ICD-10-CM | POA: Insufficient documentation

## 2016-10-30 DIAGNOSIS — D72819 Decreased white blood cell count, unspecified: Secondary | ICD-10-CM | POA: Insufficient documentation

## 2016-10-30 DIAGNOSIS — N189 Chronic kidney disease, unspecified: Secondary | ICD-10-CM | POA: Diagnosis not present

## 2016-10-30 DIAGNOSIS — Z79899 Other long term (current) drug therapy: Secondary | ICD-10-CM | POA: Insufficient documentation

## 2016-10-30 DIAGNOSIS — K219 Gastro-esophageal reflux disease without esophagitis: Secondary | ICD-10-CM | POA: Insufficient documentation

## 2016-10-30 DIAGNOSIS — I129 Hypertensive chronic kidney disease with stage 1 through stage 4 chronic kidney disease, or unspecified chronic kidney disease: Secondary | ICD-10-CM | POA: Insufficient documentation

## 2016-10-30 DIAGNOSIS — E785 Hyperlipidemia, unspecified: Secondary | ICD-10-CM | POA: Diagnosis not present

## 2016-10-30 DIAGNOSIS — D696 Thrombocytopenia, unspecified: Secondary | ICD-10-CM | POA: Diagnosis not present

## 2016-10-30 DIAGNOSIS — Z9049 Acquired absence of other specified parts of digestive tract: Secondary | ICD-10-CM | POA: Insufficient documentation

## 2016-10-30 DIAGNOSIS — J45909 Unspecified asthma, uncomplicated: Secondary | ICD-10-CM | POA: Insufficient documentation

## 2016-10-30 NOTE — Progress Notes (Signed)
[facility] day:  10/30/2016  Chief Complaint: Julie Holland is a 76 y.o. female with leukopenia and thrombocytopenia who is referred in consultation by Dr. Bary Castilla for assessment and management.  HPI:  The patient notes a history of 2 recent surgeries in 07/2016.  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  She was admitted to [facility] from 07/31/2016 - 08/01/2016 with rectal bleeding.  She underwent oversewing of a bleeding arterial vessel at the site of previous mucosal resection.  Postoperative course was unremarkable. She was transfused 2 units packed red blood cells for a hemoglobin of 6.8.  CBC on 07/31/2016 revealed a hematocrit 27.6, hemoglobin 9.3, MCV 90.9, platelets 81,000, and WBC 5,300.  Prior CBC on 03/13/2015 revealed a hematocrit 35.8, hemoglobin 11.7, and platelets 84,000.  Review of labs dating back to 05/06/2011 revealed intermittent leukopenia with a WBC ranging from 2600- 6600.  ANC has been > 1000.  Platelet count was intermittently low before 08/01/2013.  Since 03/13/2015, she has had thrombocytopenia < 100,000.  Hematocrit has been normal, except with her episode of rectal bleeding.  She has taken oral iron since her surgery.  CBC on 10/14/2016 revealed a hematocrit of 39.4, hemoglobin 12.3, MCV 92, platelets 90,000, WBC 2500 with an ANC of 1100.  Differential included 43% segs, 34% lymphs, and 22% monocytes.  Ferritin was 47.  She denies any new medications or herbal products.  She denies any quinine water.  Her diet is good.  She and her husband eat out at lunch.  She eats meat daily and green leafy vegetables.  She denies any family history of any blood disorders.  She denies any family history of autoimmune disease.  She has lost 3-4 pounds in the past 6 months.  She is now drinking Boost or Ensure  2x/day.  She denies any fevers or sweats.  She has occasional diarrhea since her cholecystectomy.  She has no issues with infections.   Past Medical History:  Diagnosis Date  . Arthritis   . Asthma   . 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  . Cataract   . Chronic kidney disease    stage 1  . Diabetes mellitus    type II  . GERD (gastroesophageal reflux disease)    OCC  . Hemorrhoids   . Hyperlipidemia   . Hypertension     Past Surgical History:  Procedure Laterality Date  . CATARACT EXTRACTION, BILATERAL    . CHOLECYSTECTOMY    . COLONOSCOPY  02/13/13   Dr Bary Castilla  . EPIGASTRIC HERNIA REPAIR N/A 03/20/2015   Procedure: HERNIA REPAIR EPIGASTRIC ADULT;  Surgeon: Robert Bellow, MD;  Location: [facility] ORS;  Service: General;  Laterality: N/A;  . HEMORRHOID SURGERY N/A 03/20/2015    FOCAL HIGH-GRADE SQUAMOUS INTRAEPITHELIAL LESION (HSIL, ANAL /HEMORRHOIDECTOMY;   Robert Bellow, MD [facility] ORS;  : General;  Laterality: N/A;  . HERNIA REPAIR  July 2016   Epigastric hernia, primary repair  . TONSILLECTOMY  age 11  . TOTAL ABDOMINAL HYSTERECTOMY  01/1989  . TUBAL LIGATION    . TUMOR EXCISION N/A 07/18/2016   Procedure: EXCISION RECTAL MASS;  Surgeon: Robert Bellow, MD;  Location: [facility] ORS;  Service: General;  Laterality: N/A;    Family History  Problem Relation Age of Onset  . Bladder  Cancer Mother   . Colon cancer Father   . Lung cancer Brother   . Lymphoma Sister     Social History:  reports that she has never smoked. She has never used smokeless tobacco. She reports that she does not drink alcohol or use drugs.  She lives in [place] with her husband.  She denies any exposure to radiations or toxins.  The patient is accompanied by her husband, [name], today.  Allergies:  Allergies  Allergen Reactions  . Hydrocodone Nausea And Vomiting and Other (See Comments)    Vertigo but patient takes  hydrocodone/acetaminophen outpatient    Current Medications: Current Outpatient Prescriptions  Medication Sig Dispense Refill  . ACCU-CHEK SMARTVIEW test strip USE AS DIRECTED 100 each 2  . acetaminophen (TYLENOL) 500 MG tablet Take 500-1,000 mg by mouth every 6 (six) hours as needed (for pain).    . ADVAIR DISKUS 100-50 MCG/DOSE AEPB USE 1 INHALATION INTO THE  LUNGS DAILY 14 each 0  . albuterol (PROVENTIL HFA;VENTOLIN HFA) 108 (90 BASE) MCG/ACT inhaler Inhale 2 puffs into the lungs every 6 (six) hours as needed for wheezing. 6.7 g 11  . aspirin 81 MG chewable tablet Chew 81 mg by mouth daily.    . Carboxymeth-Glycerin-Polysorb (REFRESH OPTIVE ADVANCED OP) Place 1 drop into both eyes 3 (three) times daily as needed (for dry/irritated eyes).    . carvedilol (COREG) 6.25 MG tablet Take 1 tablet by mouth two  times daily with a meal 180 tablet 0  . Ferrous Gluconate (IRON 27 PO) Take 27 mg by mouth.    . fluticasone (FLONASE) 50 MCG/ACT nasal spray Place 1 spray into both nostrils daily as needed (for allergies.).     Marland Kitchen metFORMIN (GLUCOPHAGE) 850 MG tablet TAKE 1 TABLET BY MOUTH TWO  TIMES DAILY WITH MEALS 180 tablet 0  . Multiple Vitamin (MULTIVITAMIN) tablet Take 2 tablets by mouth daily.     . Nutritional Supplements (ESTROVEN PO) Take 1 tablet by mouth daily.    . simvastatin (ZOCOR) 20 MG tablet Take 1 tablet (20 mg total) by mouth at bedtime. 90 tablet 0  . trolamine salicylate (ASPERCREME) 10 % cream Apply 1 application topically 3 (three) times daily as needed (for arthritis pain/hip pain.).     No current facility-administered medications for this visit.     Review of Systems:  GENERAL:  Feels good. No fevers or sweats.  Weight loss of 3-4 pounds in 6 motnhs. PERFORMANCE STATUS (ECOG):  0 HEENT:  No visual changes, runny nose, sore throat, mouth sores or tenderness. Lungs: No shortness of breath or cough.  No hemoptysis. Cardiac:  No chest pain, palpitations, orthopnea, or  PND. GI:  Occasional diarrhea secondary to cholecystectomy.  No nausea, vomiting, constipation, melena or hematochezia. GU:  No urgency, frequency, dysuria, or hematuria. Musculoskeletal:  No back pain.  Arthritis in hands.  No muscle tenderness. Extremities:  Left ankle swelling s/p fall, improving.  No pain. Skin:  No rashes or skin changes. Neuro:  No headache, numbness or weakness, balance or coordination issues. Endocrine: Diabetes.  No thyroid issues, hot flashes or night sweats. Psych:  Stress secondary to youngest son.  No depression. Pain:  No focal pain. Review of systems:  All other systems reviewed and found to be negative.  Physical Exam: Blood pressure (!) 170/98, pulse 87, temperature 99.2 F (37.3 C), temperature source Tympanic, resp. rate 18, height 5\' 3"  (1.6 m), weight 119 lb 7.8 oz (54.2 kg). GENERAL:  Thin woman sitting  comfortably in the exam room in no acute distress. MENTAL STATUS:  Alert and oriented to person, place and time. HEAD:  Short gray hair.  Normocephalic, atraumatic, face symmetric, no Cushingoid features. EYES:  Pupils equal round and reactive to light and accomodation.  No conjunctivitis or scleral icterus. ENT:  Oropharynx clear without lesion.  Tongue normal. Mucous membranes moist.  RESPIRATORY:  Clear to auscultation without rales, wheezes or rhonchi. CARDIOVASCULAR:  Regular rate and rhythm without murmur, rub or gallop. ABDOMEN:  Soft, non-tender, with active bowel sounds, and no hepatosplenomegaly.  No masses. SKIN:  No rashes, ulcers or lesions. EXTREMITIES:  Left ankle edema.  No skin discoloration or tenderness.  No palpable cords. LYMPH NODES: No palpable cervical, supraclavicular, axillary or inguinal adenopathy  NEUROLOGICAL: Unremarkable. PSYCH:  Appropriate.   No visits with results within 3 Day(s) from this visit.  Latest known visit with results is:  Orders Only on 10/06/2016  Component Date Value Ref Range Status  . WBC  10/14/2016 2.5* 3.4 - 10.8 x10E3/uL Final  . RBC 10/14/2016 4.28  3.77 - 5.28 x10E6/uL Final  . Hemoglobin 10/14/2016 12.3  11.1 - 15.9 g/dL Final  . Hematocrit 10/14/2016 39.4  34.0 - 46.6 % Final  . MCV 10/14/2016 92  79 - 97 fL Final  . MCH 10/14/2016 28.7  26.6 - 33.0 pg Final  . MCHC 10/14/2016 31.2* 31.5 - 35.7 g/dL Final  . RDW 10/14/2016 16.0* 12.3 - 15.4 % Final  . Platelets 10/14/2016 90* 150 - 379 x10E3/uL Final  . Neutrophils 10/14/2016 43  Not Estab. % Final  . Lymphs 10/14/2016 34  Not Estab. % Final  . Monocytes 10/14/2016 22  Not Estab. % Final  . Eos 10/14/2016 1  Not Estab. % Final  . Basos 10/14/2016 0  Not Estab. % Final  . Neutrophils Absolute 10/14/2016 1.1* 1.4 - 7.0 x10E3/uL Final  . Lymphocytes Absolute 10/14/2016 0.8  0.7 - 3.1 x10E3/uL Final  . Monocytes Absolute 10/14/2016 0.5  0.1 - 0.9 x10E3/uL Final  . EOS (ABSOLUTE) 10/14/2016 0.0  0.0 - 0.4 x10E3/uL Final  . Basophils Absolute 10/14/2016 0.0  0.0 - 0.2 x10E3/uL Final  . Immature Granulocytes 10/14/2016 0  Not Estab. % Final  . Immature Grans (Abs) 10/14/2016 0.0  0.0 - 0.1 x10E3/uL Final  . Hematology Comments: 10/14/2016 Note:   Final  . Glucose 10/14/2016 213* 65 - 99 mg/dL Final  . BUN 10/14/2016 12  8 - 27 mg/dL Final  . Creatinine, Ser 10/14/2016 0.56* 0.57 - 1.00 mg/dL Final  . GFR calc non Af Amer 10/14/2016 90  >59 mL/min/1.73 Final  . GFR calc Af Amer 10/14/2016 104  >59 mL/min/1.73 Final  . BUN/Creatinine Ratio 10/14/2016 21  12 - 28 Final  . Sodium 10/14/2016 136  134 - 144 mmol/L Final  . Potassium 10/14/2016 3.9  3.5 - 5.2 mmol/L Final  . Chloride 10/14/2016 93* 96 - 106 mmol/L Final  . CO2 10/14/2016 29  18 - 29 mmol/L Final  . Calcium 10/14/2016 9.3  8.7 - 10.3 mg/dL Final  . Ferritin 10/14/2016 47  15 - 150 ng/mL Final    Assessment:  KENNIDI REYNOLD is a 76 y.o. female with leukopenia and thrombocytopenia of unclear etiology.  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   She denies any new medications or herbal products.  She denies any quinine water.  Her diet appears good.   Labs dating back to 05/06/2011 revealed intermittent leukopenia with a WBC  ranging from 2600- 6600.  ANC has been > 1000.  Platelet count was intermittently low before 08/01/2013.  Since 03/13/2015, she has had thrombocytopenia < 100,000.  Hematocrit has been normal, except with her episode of rectal bleeding in 07/2016.   She has a history of rectal bleeding requiring 2 units of PRBCs on 07/31/2016.  She underwent oversewing of a bleeding arterial vessel at the site of previous mucosal resection.   She has taken oral iron since her surgery.  Symptomatically, she has lost 3-4 pounds in the past 6 months.  She denies any fevers or sweats.  She has occasional diarrhea since her cholecystectomy.  She has no issues with infections.  Exam reveals no adenopathy or hepatosplenomegaly.  Plan: 1.  Review mild leukopenia and thrombocytopenia over past several years.  Hematocrit has always been normal except for episode of rectal bleeding.  Diet appears good.  No medications are implicated.  No blood exposure until 07/2016 (doubt hepatitis or HIV).  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  Without recurrent infections or gingivitis, doubt cyclic neutropenia.  ANC has always been > 1000.  Discuss baseline labs. 2.  Labs today:  CBC with diff, CMP, B12, folate, TSH, copper level, ANA with reflex, hepatitis B core antibody total, hepatitis C antibody, HIV testing (patient consented). 3.  Peripheral smear for pathologic review. 4.  RTC in 10 days for MD assessment and review of testing.   Lequita Asal, MD  10/30/2016, 4:16 PM

## 2016-10-30 NOTE — Progress Notes (Signed)
Patient here today as new evaluation regarding leukopenia/thrombocytopenia.  Referred by Dr. Bary Castilla.  BP elevated. 170/98 HR 87.

## 2016-10-31 ENCOUNTER — Inpatient Hospital Stay: Payer: Medicare Other

## 2016-10-31 DIAGNOSIS — I129 Hypertensive chronic kidney disease with stage 1 through stage 4 chronic kidney disease, or unspecified chronic kidney disease: Secondary | ICD-10-CM | POA: Diagnosis not present

## 2016-10-31 DIAGNOSIS — D696 Thrombocytopenia, unspecified: Secondary | ICD-10-CM | POA: Diagnosis not present

## 2016-10-31 DIAGNOSIS — J45909 Unspecified asthma, uncomplicated: Secondary | ICD-10-CM | POA: Diagnosis not present

## 2016-10-31 DIAGNOSIS — Z7984 Long term (current) use of oral hypoglycemic drugs: Secondary | ICD-10-CM | POA: Diagnosis not present

## 2016-10-31 DIAGNOSIS — Z9049 Acquired absence of other specified parts of digestive tract: Secondary | ICD-10-CM | POA: Diagnosis not present

## 2016-10-31 DIAGNOSIS — Z79899 Other long term (current) drug therapy: Secondary | ICD-10-CM | POA: Diagnosis not present

## 2016-10-31 DIAGNOSIS — E785 Hyperlipidemia, unspecified: Secondary | ICD-10-CM | POA: Diagnosis not present

## 2016-10-31 DIAGNOSIS — N189 Chronic kidney disease, unspecified: Secondary | ICD-10-CM | POA: Diagnosis not present

## 2016-10-31 DIAGNOSIS — Z7982 Long term (current) use of aspirin: Secondary | ICD-10-CM | POA: Diagnosis not present

## 2016-10-31 DIAGNOSIS — K219 Gastro-esophageal reflux disease without esophagitis: Secondary | ICD-10-CM | POA: Diagnosis not present

## 2016-10-31 DIAGNOSIS — D72819 Decreased white blood cell count, unspecified: Secondary | ICD-10-CM | POA: Diagnosis not present

## 2016-10-31 DIAGNOSIS — E1122 Type 2 diabetes mellitus with diabetic chronic kidney disease: Secondary | ICD-10-CM | POA: Diagnosis not present

## 2016-10-31 LAB — CBC WITH DIFFERENTIAL/PLATELET
Basophils Absolute: 0 10*3/uL (ref 0–0.1)
Basophils Relative: 1 %
Eosinophils Absolute: 0 10*3/uL (ref 0–0.7)
Eosinophils Relative: 1 %
HCT: 36.2 % (ref 35.0–47.0)
Hemoglobin: 12.1 g/dL (ref 12.0–16.0)
Lymphocytes Relative: 21 %
Lymphs Abs: 0.7 10*3/uL — ABNORMAL LOW (ref 1.0–3.6)
MCH: 29.4 pg (ref 26.0–34.0)
MCHC: 33.4 g/dL (ref 32.0–36.0)
MCV: 88.1 fL (ref 80.0–100.0)
Monocytes Absolute: 0.6 10*3/uL (ref 0.2–0.9)
Monocytes Relative: 19 %
Neutro Abs: 1.8 10*3/uL (ref 1.4–6.5)
Neutrophils Relative %: 58 %
Platelets: 70 10*3/uL — ABNORMAL LOW (ref 150–440)
RBC: 4.11 MIL/uL (ref 3.80–5.20)
RDW: 16.5 % — ABNORMAL HIGH (ref 11.5–14.5)
WBC: 3.1 10*3/uL — ABNORMAL LOW (ref 3.6–11.0)

## 2016-10-31 LAB — COMPREHENSIVE METABOLIC PANEL
ALT: 25 U/L (ref 14–54)
AST: 32 U/L (ref 15–41)
Albumin: 4.2 g/dL (ref 3.5–5.0)
Alkaline Phosphatase: 76 U/L (ref 38–126)
Anion gap: 9 (ref 5–15)
BUN: 14 mg/dL (ref 6–20)
CO2: 27 mmol/L (ref 22–32)
Calcium: 9.4 mg/dL (ref 8.9–10.3)
Chloride: 95 mmol/L — ABNORMAL LOW (ref 101–111)
Creatinine, Ser: 0.53 mg/dL (ref 0.44–1.00)
GFR calc Af Amer: >60 mL/min (ref >60)
GFR calc non Af Amer: >60 mL/min (ref >60)
Glucose, Bld: 255 mg/dL — ABNORMAL HIGH (ref 65–99)
Potassium: 3.7 mmol/L (ref 3.5–5.1)
Sodium: 131 mmol/L — ABNORMAL LOW (ref 135–145)
Total Bilirubin: 0.7 mg/dL (ref 0.3–1.2)
Total Protein: 8 g/dL (ref 6.5–8.1)

## 2016-10-31 LAB — PATHOLOGIST SMEAR REVIEW

## 2016-10-31 LAB — FOLATE: Folate: 46 ng/mL (ref >5.9)

## 2016-10-31 LAB — APTT: aPTT: 33 seconds (ref 24–36)

## 2016-10-31 LAB — TSH: TSH: 2.767 u[IU]/mL (ref 0.350–4.500)

## 2016-10-31 LAB — VITAMIN B12: Vitamin B-12: 544 pg/mL (ref 180–914)

## 2016-11-01 LAB — HEPATITIS C ANTIBODY: HCV Ab: <0.1 s/co ratio (ref 0.0–0.9)

## 2016-11-01 LAB — ANA W/REFLEX IF POSITIVE: Anti Nuclear Antibody(ANA): NEGATIVE

## 2016-11-01 LAB — HEPATITIS B CORE ANTIBODY, TOTAL: Hep B Core Total Ab: NEGATIVE

## 2016-11-01 LAB — HIV ANTIBODY (ROUTINE TESTING W REFLEX): HIV Screen 4th Generation wRfx: NONREACTIVE

## 2016-11-01 LAB — COPPER, SERUM: Copper: 111 ug/dL (ref 72–166)

## 2016-11-11 ENCOUNTER — Other Ambulatory Visit: Payer: Self-pay | Admitting: Family Medicine

## 2016-11-11 DIAGNOSIS — I10 Essential (primary) hypertension: Secondary | ICD-10-CM

## 2016-11-13 ENCOUNTER — Ambulatory Visit: Payer: Medicare Other | Admitting: Hematology and Oncology

## 2016-11-14 ENCOUNTER — Encounter: Payer: Self-pay | Admitting: Hematology and Oncology

## 2016-11-14 ENCOUNTER — Inpatient Hospital Stay: Payer: Medicare Other | Attending: Hematology and Oncology | Admitting: Hematology and Oncology

## 2016-11-14 ENCOUNTER — Emergency Department
Admission: EM | Admit: 2016-11-14 | Discharge: 2016-11-14 | Disposition: A | Discharge status: Home / Self Care | Payer: Medicare Other | Attending: Emergency Medicine | Admitting: Emergency Medicine

## 2016-11-14 ENCOUNTER — Encounter: Payer: Self-pay | Admitting: Emergency Medicine

## 2016-11-14 VITALS — BP 204/111 | HR 89 | Temp 98.7°F | Resp 18 | Wt 117.5 lb

## 2016-11-14 DIAGNOSIS — K219 Gastro-esophageal reflux disease without esophagitis: Secondary | ICD-10-CM | POA: Insufficient documentation

## 2016-11-14 DIAGNOSIS — J45909 Unspecified asthma, uncomplicated: Secondary | ICD-10-CM | POA: Diagnosis not present

## 2016-11-14 DIAGNOSIS — E785 Hyperlipidemia, unspecified: Secondary | ICD-10-CM | POA: Insufficient documentation

## 2016-11-14 DIAGNOSIS — N189 Chronic kidney disease, unspecified: Secondary | ICD-10-CM | POA: Diagnosis not present

## 2016-11-14 DIAGNOSIS — I129 Hypertensive chronic kidney disease with stage 1 through stage 4 chronic kidney disease, or unspecified chronic kidney disease: Secondary | ICD-10-CM | POA: Insufficient documentation

## 2016-11-14 DIAGNOSIS — K625 Hemorrhage of anus and rectum: Secondary | ICD-10-CM | POA: Insufficient documentation

## 2016-11-14 DIAGNOSIS — Z7982 Long term (current) use of aspirin: Secondary | ICD-10-CM | POA: Insufficient documentation

## 2016-11-14 DIAGNOSIS — E1122 Type 2 diabetes mellitus with diabetic chronic kidney disease: Secondary | ICD-10-CM | POA: Insufficient documentation

## 2016-11-14 DIAGNOSIS — D72819 Decreased white blood cell count, unspecified: Secondary | ICD-10-CM

## 2016-11-14 DIAGNOSIS — D696 Thrombocytopenia, unspecified: Secondary | ICD-10-CM | POA: Diagnosis not present

## 2016-11-14 DIAGNOSIS — D709 Neutropenia, unspecified: Secondary | ICD-10-CM

## 2016-11-14 DIAGNOSIS — Z7984 Long term (current) use of oral hypoglycemic drugs: Secondary | ICD-10-CM | POA: Insufficient documentation

## 2016-11-14 DIAGNOSIS — N181 Chronic kidney disease, stage 1: Secondary | ICD-10-CM | POA: Insufficient documentation

## 2016-11-14 DIAGNOSIS — Z79899 Other long term (current) drug therapy: Secondary | ICD-10-CM | POA: Insufficient documentation

## 2016-11-14 DIAGNOSIS — I10 Essential (primary) hypertension: Secondary | ICD-10-CM

## 2016-11-14 MED ORDER — ACETAMINOPHEN 500 MG PO TABS
1000.0000 mg | ORAL_TABLET | Freq: Once | ORAL | Status: AC
  Administered 2016-11-14: 1000 mg via ORAL
  Filled 2016-11-14: qty 2

## 2016-11-14 NOTE — ED Triage Notes (Signed)
Patient presents to ED via POV from PCP office due to HTN. Hx of same. Patient states she has had her BP medication today. Patient denies any other symptoms. Denies CP, HA or SOB. A&O x4.

## 2016-11-14 NOTE — Progress Notes (Signed)
[facility] day:  11/14/2016  Chief Complaint: SHAYNNA HUSBY is a 76 y.o. female with leukopenia and thrombocytopenia who is seen for review of work-up and discussion regarding direction of therapy.Marland Kitchen  HPI:  The patient was last seen in the hematology clinic on 10/30/2016 for initial consultation.  She had mild leukopenia and thrombocytopenia over past several years.  Diet appeared good.  No medications were implicated.  No blood exposure until 07/2016 (doubt hepatitis or HIV).  She was felt to have a possible myelodysplastic syndrome.  She underwent a work-up.  CBC revealed a hematocrit of 36.2, hemoglobin 12.1, MCV 88.1, platelets 70,000, white count 3100 with an ANC of 1800.  Differential was unremarkable.  CMP was notable for a sodium of 131 and glucose of 255.  B12 was 544. Folate 46.  TSH was 2.767. ANA was negative. Hepatitis B core antibody negative.  Hepatitis C antibody was negative. HIV antibody was negative.  Copper level was 111.  PTT was 33.  Peripheral smear revealed giant platelets. Red blood cell and white cell morphology were unremarkable.  Symptomatically, she denies any complaints.   Past Medical History:  Diagnosis Date  . Arthritis   . Asthma   . 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  . Cataract   . Chronic kidney disease    stage 1  . Diabetes mellitus    type II  . GERD (gastroesophageal reflux disease)    OCC  . Hemorrhoids   . Hyperlipidemia   . Hypertension     Past Surgical History:  Procedure Laterality Date  . CATARACT EXTRACTION, BILATERAL    . CHOLECYSTECTOMY    . COLONOSCOPY  02/13/13   Dr Bary Castilla  . EPIGASTRIC HERNIA REPAIR N/A 03/20/2015   Procedure: HERNIA REPAIR EPIGASTRIC ADULT;  Surgeon: Robert Bellow, MD;  Location: [facility] ORS;  Service: General;  Laterality: N/A;  . HEMORRHOID SURGERY N/A 03/20/2015    FOCAL HIGH-GRADE SQUAMOUS  INTRAEPITHELIAL LESION (HSIL, ANAL /HEMORRHOIDECTOMY;   Robert Bellow, MD [facility] ORS;  : General;  Laterality: N/A;  . HERNIA REPAIR  July 2016   Epigastric hernia, primary repair  . TONSILLECTOMY  age 60  . TOTAL ABDOMINAL HYSTERECTOMY  01/1989  . TUBAL LIGATION    . TUMOR EXCISION N/A 07/18/2016   Procedure: EXCISION RECTAL MASS;  Surgeon: Robert Bellow, MD;  Location: [facility] ORS;  Service: General;  Laterality: N/A;    Family History  Problem Relation Age of Onset  . Bladder Cancer Mother   . Colon cancer Father   . Lung cancer Brother   . Lymphoma Sister     Social History:  reports that she has never smoked. She has never used smokeless tobacco. She reports that she does not drink alcohol or use drugs.  She lives in [place] with her husband.  She denies any exposure to radiations or toxins.  The patient is accompanied by her husband, [name], today.  Allergies:  Allergies  Allergen Reactions  . Hydrocodone Nausea And Vomiting and Other (See Comments)    Vertigo but patient takes hydrocodone/acetaminophen outpatient    Current Medications: Current Outpatient Prescriptions  Medication Sig Dispense Refill  . ACCU-CHEK SMARTVIEW test strip USE AS DIRECTED 100 each 2  . acetaminophen (TYLENOL) 500 MG tablet Take 500-1,000 mg by mouth every 6 (six) hours as needed (for pain).    . ADVAIR DISKUS 100-50 MCG/DOSE  AEPB USE 1 INHALATION INTO THE  LUNGS DAILY 14 each 0  . albuterol (PROVENTIL HFA;VENTOLIN HFA) 108 (90 BASE) MCG/ACT inhaler Inhale 2 puffs into the lungs every 6 (six) hours as needed for wheezing. 6.7 g 11  . aspirin 81 MG chewable tablet Chew 81 mg by mouth daily.    . Carboxymeth-Glycerin-Polysorb (REFRESH OPTIVE ADVANCED OP) Place 1 drop into both eyes 3 (three) times daily as needed (for dry/irritated eyes).    . carvedilol (COREG) 6.25 MG tablet Take 1 tablet by mouth two  times daily with a meal 180 tablet 0  . Ferrous Gluconate (IRON 27 PO) Take 27 mg by mouth.     . fluticasone (FLONASE) 50 MCG/ACT nasal spray Place 1 spray into both nostrils daily as needed (for allergies.).     Marland Kitchen metFORMIN (GLUCOPHAGE) 850 MG tablet TAKE 1 TABLET BY MOUTH TWO  TIMES DAILY WITH MEALS 180 tablet 0  . Multiple Vitamin (MULTIVITAMIN) tablet Take 2 tablets by mouth daily.     . Nutritional Supplements (ESTROVEN PO) Take 1 tablet by mouth daily.    . simvastatin (ZOCOR) 20 MG tablet Take 1 tablet (20 mg total) by mouth at bedtime. 90 tablet 0  . trolamine salicylate (ASPERCREME) 10 % cream Apply 1 application topically 3 (three) times daily as needed (for arthritis pain/hip pain.).     No current facility-administered medications for this visit.     Review of Systems:  GENERAL:  Feels good. No fevers or sweats.  Weight down 2 pounds since last visit. PERFORMANCE STATUS (ECOG):  0 HEENT:  No visual changes, runny nose, sore throat, mouth sores or tenderness. Lungs: No shortness of breath or cough.  No hemoptysis. Cardiac:  No chest pain, palpitations, orthopnea, or PND. GI:  Occasional diarrhea secondary to cholecystectomy.  No nausea, vomiting, constipation, melena or hematochezia. GU:  No urgency, frequency, dysuria, or hematuria. Musculoskeletal:  No back pain.  Arthritis in hands.  No muscle tenderness. Extremities:  Left ankle swelling s/p fall, improving.  No pain. Skin:  No rashes or skin changes. Neuro:  No headache, numbness or weakness, balance or coordination issues. Endocrine: Diabetes.  No thyroid issues, hot flashes or night sweats. Psych:  Stress secondary to youngest son.  No depression. Pain:  No focal pain. Review of systems:  All other systems reviewed and found to be negative.  Physical Exam: Blood pressure (!) 174/88, pulse 91, temperature 98.7 F (37.1 C), temperature source Tympanic, resp. rate 18, weight 117 lb 8.1 oz (53.3 kg). GENERAL:  Thin woman sitting comfortably in the exam room in no acute distress. MENTAL STATUS:  Alert and  oriented to person, place and time. HEAD:  Short gray hair.  Normocephalic, atraumatic, face symmetric, no Cushingoid features. EYES:  No conjunctivitis or scleral icterus. NEUROLOGICAL: Unremarkable. PSYCH:  Appropriate.   No visits with results within 3 Day(s) from this visit.  Latest known visit with results is:  Appointment on 10/31/2016  Component Date Value Ref Range Status  . WBC 10/31/2016 3.1* 3.6 - 11.0 K/uL Final  . RBC 10/31/2016 4.11  3.80 - 5.20 MIL/uL Final  . Hemoglobin 10/31/2016 12.1  12.0 - 16.0 g/dL Final  . HCT 10/31/2016 36.2  35.0 - 47.0 % Final  . MCV 10/31/2016 88.1  80.0 - 100.0 fL Final  . MCH 10/31/2016 29.4  26.0 - 34.0 pg Final  . MCHC 10/31/2016 33.4  32.0 - 36.0 g/dL Final  . RDW 10/31/2016 16.5* 11.5 - 14.5 % Final  .  Platelets 10/31/2016 70* 150 - 440 K/uL Final  . Neutrophils Relative % 10/31/2016 58  % Final  . Neutro Abs 10/31/2016 1.8  1.4 - 6.5 K/uL Final  . Lymphocytes Relative 10/31/2016 21  % Final  . Lymphs Abs 10/31/2016 0.7* 1.0 - 3.6 K/uL Final  . Monocytes Relative 10/31/2016 19  % Final  . Monocytes Absolute 10/31/2016 0.6  0.2 - 0.9 K/uL Final  . Eosinophils Relative 10/31/2016 1  % Final  . Eosinophils Absolute 10/31/2016 0.0  0 - 0.7 K/uL Final  . Basophils Relative 10/31/2016 1  % Final  . Basophils Absolute 10/31/2016 0.0  0 - 0.1 K/uL Final  . aPTT 10/31/2016 33  24 - 36 seconds Final  . Sodium 10/31/2016 131* 135 - 145 mmol/L Final  . Potassium 10/31/2016 3.7  3.5 - 5.1 mmol/L Final  . Chloride 10/31/2016 95* 101 - 111 mmol/L Final  . CO2 10/31/2016 27  22 - 32 mmol/L Final  . Glucose, Bld 10/31/2016 255* 65 - 99 mg/dL Final  . BUN 10/31/2016 14  6 - 20 mg/dL Final  . Creatinine, Ser 10/31/2016 0.53  0.44 - 1.00 mg/dL Final  . Calcium 10/31/2016 9.4  8.9 - 10.3 mg/dL Final  . Total Protein 10/31/2016 8.0  6.5 - 8.1 g/dL Final  . Albumin 10/31/2016 4.2  3.5 - 5.0 g/dL Final  . AST 10/31/2016 32  15 - 41 U/L Final  . ALT  10/31/2016 25  14 - 54 U/L Final  . Alkaline Phosphatase 10/31/2016 76  38 - 126 U/L Final  . Total Bilirubin 10/31/2016 0.7  0.3 - 1.2 mg/dL Final  . GFR calc non Af Amer 10/31/2016 >60  >60 mL/min Final  . GFR calc Af Amer 10/31/2016 >60  >60 mL/min Final   Comment: (NOTE) The eGFR has been calculated using the CKD EPI equation. This calculation has not been validated in all clinical situations. eGFR's persistently <60 mL/min signify possible Chronic Kidney Disease.   . Anion gap 10/31/2016 9  5 - 15 Final  . Vitamin B-12 10/31/2016 544  180 - 914 pg/mL Final   Comment: (NOTE) This assay is not validated for testing neonatal or myeloproliferative syndrome specimens for Vitamin B12 levels. Performed at [facility], [place] [address] [place], [place] [address]   . Folate 10/31/2016 46.0  >5.9 ng/mL Final  . TSH 10/31/2016 2.767  0.350 - 4.500 uIU/mL Final  . Anit Nuclear Antibody(ANA) 10/31/2016 Negative  Negative Final   Comment: (NOTE) Performed At: [facility] [place], [place] [id number] Lindon Romp MD [id number]   . HCV Ab 10/31/2016 <0.1  0.0 - 0.9 s/co ratio Final   Comment: (NOTE)                                  Negative:     < 0.8                             Indeterminate: 0.8 - 0.9                                  Positive:     > 0.9 The CDC recommends that a positive HCV antibody result be followed up with a HCV Nucleic Acid Amplification test (009381). Performed At: [facility] [place], [place] [id number] Lindon Romp MD [id number]   . Hep B Core Total Ab 10/31/2016 Negative  Negative Final   Comment: (NOTE) Performed At: [facility] [place], [place] [id number] Lindon Romp MD [id number]   . Copper 10/31/2016 111  72 - 166 ug/dL Final   Comment: (NOTE)                                Detection Limit = 5 Performed At: [facility] [place], [place] [id number] Lindon Romp MD [id number]   . HIV Screen 4th Generation wRfx 10/31/2016 Non Reactive  Non Reactive Final   Comment: (NOTE) Performed At: [facility] [place], [place] [id number] Lindon Romp MD [id number]   . Path Review 10/31/2016 Peripheral blood smear reveals leukopenia and thrombocytopenia.    Final   Comment: Giant platelets are present. RBC and WBC morphology are unremarkable. Additional laboratory work up is underway for the evaluation of this patient's intermittent leukopenia and persistent thrombocytopenia. Reviewed by Dellia Nims Reuel Derby, M.D.     Assessment:  ARLEEN BAR is a 76 y.o. female with leukopenia and thrombocytopenia of unclear etiology.  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  She denies any new medications or herbal products.  She denies any quinine water.  Her diet appears good.   Labs dating back to 05/06/2011 revealed intermittent leukopenia with a WBC ranging from 2600- 6600.  ANC has been > 1000.  Platelet count was intermittently low before 08/01/2013.  Since 03/13/2015, she has had thrombocytopenia < 100,000.  Hematocrit has been normal, except with her episode of rectal bleeding in 07/2016.   Work-up on 10/30/2016 revealed a hematocrit of 36.2, hemoglobin 12.1, MCV 88.1, platelets 70,000, white count 3100 with an ANC of 1800.  Differential was unremarkable.  Normal studies included:  B12, folate, TSH, ANA, hepatitis B core antibody, hepatitis C antibody, HIV antibody, copper , and PTT.  Peripheral smear revealed giant platelets. Red blood cell and white cell morphology were unremarkable.  She has a history of rectal bleeding requiring 2 units of PRBCs on 07/31/2016.  She underwent oversewing of a bleeding arterial vessel at the site of previous mucosal resection.   She has taken oral iron since her surgery.  Symptomatically, she has lost 6 pounds in the past 6 months.  She denies any  fevers or sweats.  She has occasional diarrhea since her cholecystectomy.  She has no issues with infections.  Exam reveals no adenopathy or hepatosplenomegaly.  Plan: 1.  Review work-up.  Discuss negative work-up.  Etiology remains unclear.  Discuss possible myelodysplastic syndrome.  Diagnosis requires a bone marrow.  Discuss treatment and management.  Patient would like to follow blood counts for now.  She defers a bone marrow aspirate and biopsy.. 2.  RTC on 03/23 and 04/23 for labs (CBC with diff). 3.  RTC on 05/23 for MD assessment and labs (CBC with diff, CMP).   Lequita Asal, MD  11/14/2016, 12:09 PM

## 2016-11-14 NOTE — Discharge Instructions (Signed)
Please take all of your medications at home as prescribed. Return to the emergency department for any new or worsening symptoms such as chest pain, shortness of breath, or for any other concerns. Otherwise keep your follow-up with your primary care physician in 10 days as scheduled.

## 2016-11-14 NOTE — Progress Notes (Signed)
Patient states she has been eating better.  Makes herself eat.  States she is having more acid reflux than usual.  Has been using Tums.  BP elevated today 174/88 HR 91.

## 2016-11-14 NOTE — ED Provider Notes (Signed)
[facility] Emergency Department Provider Note  ____________________________________________   First MD Initiated Contact with Patient 11/14/16 1524     (approximate)  I have reviewed the triage vital signs and the nursing notes.   HISTORY  Chief Complaint Hypertension    HPI Julie Holland is a 76 y.o. female who is sent to the emergency department by her oncologist for elevated blood pressure. She went for routine follow-up for her possible myelodysplastic disorderand on discharge her blood pressure was noted to be 208 so she was sent to the emergency department. She denies chest pain, shortness of breath, abdominal pain, nausea, vomiting. She does have a gradual onset not maximal onset headache that she has had for several weeks but this is no different than previous. She has no numbness or weakness. No double vision or blurred vision. She would not of come to the emergency department had she not been sent.   Past Medical History:  Diagnosis Date  . Arthritis   . Asthma   . 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  . Cataract   . Chronic kidney disease    stage 1  . Diabetes mellitus    type II  . GERD (gastroesophageal reflux disease)    OCC  . Hemorrhoids   . Hyperlipidemia   . Hypertension     Patient Active Problem List   Diagnosis Date Noted  . Neutropenia ([place]) 10/20/2016  . Thrombocytopenia ([place]) 10/20/2016  . Postoperative hemorrhage involving digestive system following digestive system procedure 08/04/2016  . Rectal bleeding 07/31/2016  . 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 Asthma 08/17/2015  . Umbilical hernia without obstruction and without gangrene 02/27/2015  . 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 Routine general medical examination at a health care facility 03/15/2013  . 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 Shoulder pain 11/23/2012  . Right bundle branch block 11/11/2012  . Diabetes mellitus, controlled ([place])   . Hyperlipidemia   . Hypertension     Past Surgical History:  Procedure Laterality Date  . CATARACT EXTRACTION, BILATERAL    . CHOLECYSTECTOMY    . COLONOSCOPY  02/13/13   Dr Bary Castilla  . EPIGASTRIC HERNIA REPAIR N/A 03/20/2015   Procedure: HERNIA REPAIR EPIGASTRIC ADULT;  Surgeon: Robert Bellow, MD;  Location: [facility] ORS;  Service: General;  Laterality: N/A;  . HEMORRHOID SURGERY N/A 03/20/2015    FOCAL HIGH-GRADE SQUAMOUS INTRAEPITHELIAL LESION (HSIL, ANAL /HEMORRHOIDECTOMY;   Robert Bellow, MD [facility] ORS;  : General;  Laterality: N/A;  . HERNIA REPAIR  July 2016   Epigastric hernia, primary repair  . TONSILLECTOMY  age 48  . TOTAL ABDOMINAL HYSTERECTOMY  01/1989  . TUBAL LIGATION    . TUMOR EXCISION N/A 07/18/2016   Procedure: EXCISION RECTAL MASS;  Surgeon: Robert Bellow, MD;  Location: [facility] ORS;  Service: General;  Laterality: N/A;    Prior to Admission medications   Medication Sig Start Date End Date Taking? Authorizing Provider  ACCU-CHEK SMARTVIEW test strip USE AS DIRECTED 10/27/16   Roselee Nova, MD  acetaminophen (TYLENOL) 500 MG tablet Take 500-1,000 mg by mouth every 6 (six) hours as needed (for pain).    Historical Provider, MD  ADVAIR DISKUS 100-50 MCG/DOSE AEPB USE 1 INHALATION INTO THE  LUNGS DAILY 09/11/16   Roselee Nova, MD  albuterol (PROVENTIL HFA;VENTOLIN  HFA) 108 (90 BASE) MCG/ACT inhaler Inhale 2 puffs into the lungs every 6 (six) hours as needed for wheezing. 03/14/13   Crecencio Mc, MD  aspirin 81 MG chewable tablet Chew 81 mg by mouth daily.    Historical Provider, MD  Carboxymeth-Glycerin-Polysorb (REFRESH OPTIVE ADVANCED OP) Place 1 drop into both eyes 3 (three) times daily as needed (for dry/irritated eyes).    Historical Provider, MD  carvedilol (COREG) 6.25 MG tablet Take 1 tablet by mouth two  times daily with a meal  09/04/16   Roselee Nova, MD  Ferrous Gluconate (IRON 27 PO) Take 27 mg by mouth.    Historical Provider, MD  fluticasone (FLONASE) 50 MCG/ACT nasal spray Place 1 spray into both nostrils daily as needed (for allergies.).     Historical Provider, MD  metFORMIN (GLUCOPHAGE) 850 MG tablet TAKE 1 TABLET BY MOUTH TWO  TIMES DAILY WITH MEALS 10/27/16   Roselee Nova, MD  Multiple Vitamin (MULTIVITAMIN) tablet Take 2 tablets by mouth daily.     Historical Provider, MD  Nutritional Supplements (ESTROVEN PO) Take 1 tablet by mouth daily.    Historical Provider, MD  simvastatin (ZOCOR) 20 MG tablet Take 1 tablet (20 mg total) by mouth at bedtime. 10/27/16   Roselee Nova, MD  trolamine salicylate (ASPERCREME) 10 % cream Apply 1 application topically 3 (three) times daily as needed (for arthritis pain/hip pain.).    Historical Provider, MD    Allergies Hydrocodone  Family History  Problem Relation Age of Onset  . Bladder Cancer Mother   . Colon cancer Father   . Lung cancer Brother   . Lymphoma Sister     Social History Social History  Substance Use Topics  . Smoking status: Never Smoker  . Smokeless tobacco: Never Used  . Alcohol use No    Review of Systems Constitutional: No fever/chills Eyes: No visual changes. ENT: No sore throat. Cardiovascular: Denies chest pain. Respiratory: Denies shortness of breath. Gastrointestinal: No abdominal pain.  No nausea, no vomiting.  No diarrhea.  No constipation. Genitourinary: Negative for dysuria. Musculoskeletal: Negative for back pain. Skin: Negative for rash. Neurological: Positive for headaches, Negative for focal weakness or numbness.  10-point ROS otherwise negative.  ____________________________________________   PHYSICAL EXAM:  VITAL SIGNS: ED Triage Vitals [11/14/16 1300]  Enc Vitals Group     BP (!) 201/95     Pulse Rate 85     Resp 17     Temp 98.2 F (36.8 C)     Temp Source Oral     SpO2 96 %     Weight 120  lb (54.4 kg)     Height 5\' 3"  (1.6 m)     Head Circumference      Peak Flow      Pain Score 0     Pain Loc      Pain Edu?      Excl. in [place]?     Constitutional: Alert and oriented x 4 well appearing nontoxic no diaphoresis speaks in full, clear sentences Eyes: PERRL EOMI. Head: Atraumatic. Nose: No congestion/rhinnorhea. Mouth/Throat: No trismus Neck: No stridor.   Cardiovascular: Normal rate, regular rhythm.  Good peripheral circulation. Respiratory: Normal respiratory effort.  No retractions.  Musculoskeletal: No lower extremity edema   Neurologic:  Normal speech and language. No gross focal neurologic deficits are appreciated. Skin:  Skin is warm, dry and intact. No rash noted. Psychiatric: Mood and affect are normal. Speech and  behavior are normal.  ____________________________________________   LABS (all labs ordered are listed, but only abnormal results are displayed)  Labs Reviewed - No data to display ____________________________________________  EKG   ____________________________________________  RADIOLOGY   ____________________________________________   PROCEDURES  Procedure(s) performed: no  Procedures  Critical Care performed: no  ____________________________________________   INITIAL IMPRESSION / ASSESSMENT AND PLAN / ED COURSE  Pertinent labs & imaging results that were available during my care of the patient were reviewed by me and considered in my medical decision making (see chart for details).  The patient has asymptomatic hypertension today. She is currently taking antihypertensives at home in her pressure may be elevated today secondary to anxiety regarding her appointment. Regardless according to ACEP clinical guidelines she does not warrant any testing or emergent lowering of her blood pressure today. She has follow-up with her primary care physician in 10 days and if her blood pressure remains elevated he may add on another agent. She is  discharged home in good condition.      ____________________________________________   FINAL CLINICAL IMPRESSION(S) / ED DIAGNOSES  Final diagnoses:  Asymptomatic hypertension      NEW MEDICATIONS STARTED DURING THIS VISIT:  Discharge Medication List as of 11/14/2016  3:32 PM       Note:  This document was prepared using Dragon voice recognition software and may include unintentional dictation errors.     Darel Hong, MD 11/14/16 [phone]

## 2016-11-19 NOTE — Telephone Encounter (Signed)
Error entry

## 2016-11-28 ENCOUNTER — Inpatient Hospital Stay: Payer: Medicare Other

## 2016-11-28 DIAGNOSIS — Z79899 Other long term (current) drug therapy: Secondary | ICD-10-CM | POA: Diagnosis not present

## 2016-11-28 DIAGNOSIS — J45909 Unspecified asthma, uncomplicated: Secondary | ICD-10-CM | POA: Diagnosis not present

## 2016-11-28 DIAGNOSIS — K219 Gastro-esophageal reflux disease without esophagitis: Secondary | ICD-10-CM | POA: Diagnosis not present

## 2016-11-28 DIAGNOSIS — Z7982 Long term (current) use of aspirin: Secondary | ICD-10-CM | POA: Diagnosis not present

## 2016-11-28 DIAGNOSIS — I129 Hypertensive chronic kidney disease with stage 1 through stage 4 chronic kidney disease, or unspecified chronic kidney disease: Secondary | ICD-10-CM | POA: Diagnosis not present

## 2016-11-28 DIAGNOSIS — Z7984 Long term (current) use of oral hypoglycemic drugs: Secondary | ICD-10-CM | POA: Diagnosis not present

## 2016-11-28 DIAGNOSIS — K625 Hemorrhage of anus and rectum: Secondary | ICD-10-CM | POA: Diagnosis not present

## 2016-11-28 DIAGNOSIS — N189 Chronic kidney disease, unspecified: Secondary | ICD-10-CM | POA: Diagnosis not present

## 2016-11-28 DIAGNOSIS — D72819 Decreased white blood cell count, unspecified: Secondary | ICD-10-CM | POA: Diagnosis not present

## 2016-11-28 DIAGNOSIS — E1122 Type 2 diabetes mellitus with diabetic chronic kidney disease: Secondary | ICD-10-CM | POA: Diagnosis not present

## 2016-11-28 DIAGNOSIS — D696 Thrombocytopenia, unspecified: Secondary | ICD-10-CM

## 2016-11-28 DIAGNOSIS — D709 Neutropenia, unspecified: Secondary | ICD-10-CM

## 2016-11-28 DIAGNOSIS — E785 Hyperlipidemia, unspecified: Secondary | ICD-10-CM | POA: Diagnosis not present

## 2016-11-28 LAB — CBC WITH DIFFERENTIAL/PLATELET
Basophils Absolute: 0 10*3/uL (ref 0–0.1)
Basophils Relative: 1 %
Eosinophils Absolute: 0 10*3/uL (ref 0–0.7)
Eosinophils Relative: 0 %
HCT: 35.4 % (ref 35.0–47.0)
Hemoglobin: 11.8 g/dL — ABNORMAL LOW (ref 12.0–16.0)
Lymphocytes Relative: 23 %
Lymphs Abs: 0.8 10*3/uL — ABNORMAL LOW (ref 1.0–3.6)
MCH: 29.3 pg (ref 26.0–34.0)
MCHC: 33.3 g/dL (ref 32.0–36.0)
MCV: 88 fL (ref 80.0–100.0)
Monocytes Absolute: 0.8 10*3/uL (ref 0.2–0.9)
Monocytes Relative: 23 %
Neutro Abs: 1.8 10*3/uL (ref 1.4–6.5)
Neutrophils Relative %: 53 %
Platelets: 77 10*3/uL — ABNORMAL LOW (ref 150–400)
RBC: 4.02 MIL/uL (ref 3.80–5.20)
RDW: 16.3 % — ABNORMAL HIGH (ref 11.5–14.5)
WBC: 3.4 10*3/uL — ABNORMAL LOW (ref 3.6–11.0)

## 2016-12-01 ENCOUNTER — Ambulatory Visit (INDEPENDENT_AMBULATORY_CARE_PROVIDER_SITE_OTHER): Payer: Medicare Other

## 2016-12-01 ENCOUNTER — Ambulatory Visit: Payer: Medicare Other | Admitting: Family Medicine

## 2016-12-01 VITALS — BP 168/84 | HR 88 | Temp 98.0°F | Ht 63.0 in | Wt 118.0 lb

## 2016-12-01 DIAGNOSIS — Z0000 Encounter for general adult medical examination without abnormal findings: Secondary | ICD-10-CM

## 2016-12-01 NOTE — Progress Notes (Signed)
Subjective:   Julie Holland is a 76 y.o. female who presents for Medicare Annual (Subsequent) preventive examination.  Review of Systems:  N/A Cardiac Risk Factors include: advanced age (>102men, >86 women);diabetes mellitus;dyslipidemia;hypertension     Objective:     Vitals: BP (!) 168/84 (BP Location: Left Arm)   Pulse 88   Temp 98 F (36.7 C) (Oral)   Ht 5\' 3"  (1.6 m)   Wt 118 lb (53.5 kg)   BMI 20.90 kg/m   Body mass index is 20.9 kg/m.   Tobacco History  Smoking Status  . Never Smoker  Smokeless Tobacco  . Never Used     Counseling given: Not Answered   Past Medical History:  Diagnosis Date  . Arthritis   . Asthma   . 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  . Cataract   . Chronic kidney disease    stage 1  . Diabetes mellitus    type II  . GERD (gastroesophageal reflux disease)    OCC  . Hemorrhoids   . Hyperlipidemia   . Hypertension    Past Surgical History:  Procedure Laterality Date  . CATARACT EXTRACTION, BILATERAL    . CHOLECYSTECTOMY    . COLONOSCOPY  02/13/13   Dr Bary Castilla  . EPIGASTRIC HERNIA REPAIR N/A 03/20/2015   Procedure: HERNIA REPAIR EPIGASTRIC ADULT;  Surgeon: Robert Bellow, MD;  Location: [facility] ORS;  Service: General;  Laterality: N/A;  . HEMORRHOID SURGERY N/A 03/20/2015    FOCAL HIGH-GRADE SQUAMOUS INTRAEPITHELIAL LESION (HSIL, ANAL /HEMORRHOIDECTOMY;   Robert Bellow, MD [facility] ORS;  : General;  Laterality: N/A;  . HERNIA REPAIR  July 2016   Epigastric hernia, primary repair  . TONSILLECTOMY  age 69  . TOTAL ABDOMINAL HYSTERECTOMY  01/1989  . TUBAL LIGATION    . TUMOR EXCISION N/A 07/18/2016   Procedure: EXCISION RECTAL MASS;  Surgeon: Robert Bellow, MD;  Location: [facility] ORS;  Service: General;  Laterality: N/A;   Family History  Problem Relation Age of Onset  . Bladder Cancer Mother   . Colon cancer Father   . Lung cancer Brother   . Lymphoma  Sister    History  Sexual Activity  . Sexual activity: Yes  . Birth control/ protection: Surgical    Outpatient Encounter Prescriptions as of 12/01/2016  Medication Sig  . ACCU-CHEK SMARTVIEW test strip USE AS DIRECTED  . acetaminophen (TYLENOL) 500 MG tablet Take 500-1,000 mg by mouth every 6 (six) hours as needed (for pain).  . ADVAIR DISKUS 100-50 MCG/DOSE AEPB USE 1 INHALATION INTO THE  LUNGS DAILY  . albuterol (PROVENTIL HFA;VENTOLIN HFA) 108 (90 BASE) MCG/ACT inhaler Inhale 2 puffs into the lungs every 6 (six) hours as needed for wheezing.  Marland Kitchen aspirin 81 MG chewable tablet Chew 81 mg by mouth daily.  . Carboxymeth-Glycerin-Polysorb (REFRESH OPTIVE ADVANCED OP) Place 1 drop into both eyes 3 (three) times daily as needed (for dry/irritated eyes).  . carvedilol (COREG) 6.25 MG tablet Take 1 tablet by mouth two  times daily with a meal  . Ferrous Gluconate (IRON 27 PO) Take 27 mg by mouth.  . fluticasone (FLONASE) 50 MCG/ACT nasal spray Place 1 spray into both nostrils daily as needed (for allergies.).   Marland Kitchen metFORMIN (GLUCOPHAGE) 850 MG tablet TAKE 1 TABLET BY MOUTH TWO  TIMES DAILY WITH MEALS  . Multiple Vitamin (MULTIVITAMIN) tablet Take 2 tablets by mouth daily.   Marland Kitchen  Nutritional Supplements (ESTROVEN PO) Take 1 tablet by mouth daily.  . simvastatin (ZOCOR) 20 MG tablet Take 1 tablet (20 mg total) by mouth at bedtime.  . trolamine salicylate (ASPERCREME) 10 % cream Apply 1 application topically 3 (three) times daily as needed (for arthritis pain/hip pain.).   No facility-administered encounter medications on file as of 12/01/2016.     Activities of Daily Living In your present state of health, do you have any difficulty performing the following activities: 12/01/2016 09/04/2016  Hearing? Tempie Donning  Vision? N Y  Difficulty concentrating or making decisions? Y N  Walking or climbing stairs? Y N  Dressing or bathing? N N  Doing errands, shopping? N N  Preparing Food and eating ? N -  Using  the Toilet? N -  In the past six months, have you accidently leaked urine? Y -  Do you have problems with loss of bowel control? N -  Managing your Medications? N -  Managing your Finances? N -  Housekeeping or managing your Housekeeping? N -  Some recent data might be hidden    Patient Care Team: Roselee Nova, MD as PCP - General (Family Medicine) Robert Bellow, MD as Consulting Physician (General Surgery) Agapito Games as Consulting Physician (Optometry) Gae Dry, MD as Referring Physician (Obstetrics and Gynecology) Lequita Asal, MD as Referring Physician (Hematology and Oncology)    Assessment:     Exercise Activities and Dietary recommendations Current Exercise Habits: Home exercise routine, Type of exercise: walking (around home, pt states it is structured but unsure of time frame), Frequency (Times/Week): 3 (to 4 days), Intensity: Mild  Goals    . Increase water intake          Recommend increasing water intake to 4-5 glasses a day.      Fall Risk Fall Risk  12/01/2016 09/04/2016 05/27/2016 02/21/2016 11/22/2015  Falls in the past year? Yes No No No No  Number falls in past yr: 1 - - - -  Injury with Fall? No - - - -  Follow up Falls prevention discussed - - - -   Depression Screen PHQ 2/9 Scores 12/01/2016 09/04/2016 05/27/2016 02/21/2016  PHQ - 2 Score 0 0 0 0     Cognitive Function     6CIT Screen 12/01/2016  What Year? 0 points  What month? 0 points  What time? 0 points  Count back from 20 0 points  Months in reverse 0 points  Repeat phrase 4 points  Total Score 4    Immunization History  Administered Date(s) Administered  . Influenza Split 07/29/2011, 07/13/2012  . Influenza, High Dose Seasonal PF 07/20/2013, 06/27/2015, 05/27/2016  . Pneumococcal Polysaccharide-23 07/21/2011  . Tdap 10/16/2010   Screening Tests Health Maintenance  Topic Date Due  . URINE MICROALBUMIN  12/03/2016 (Originally 09/15/2015)  . OPHTHALMOLOGY EXAM   12/07/2016 (Originally 04/19/2014)  . FOOT EXAM  02/20/2017  . HEMOGLOBIN A1C  03/05/2017  . TETANUS/TDAP  10/16/2020  . INFLUENZA VACCINE  Completed  . DEXA SCAN  Completed  . PNA vac Low Risk Adult  Completed      Plan:  I have personally reviewed and addressed the Medicare Annual Wellness questionnaire and have noted the following in the patient's chart:  A. Medical and social history B. Use of alcohol, tobacco or illicit drugs  C. Current medications and supplements D. Functional ability and status E.  Nutritional status F.  Physical activity G. Advance directives H. List of  other physicians I.  Hospitalizations, surgeries, and ER visits in previous 12 months J.  [place] such as hearing and vision if needed, cognitive and depression L. Referrals and appointments - none  In addition, I have reviewed and discussed with patient certain preventive protocols, quality metrics, and best practice recommendations. A written personalized care plan for preventive services as well as general preventive health recommendations were provided to patient.  See attached scanned questionnaire for additional information.   Signed,  Fabio Neighbors, LPN Nurse Health Advisor   MD Recommendations: Pt needs urine microalbumin when she returns on 12/04/16. Eye Exam is scheduled in 2 weeks.    I, as supervising physician, have reviewed the nurse health advisor's Medicare Wellness Visit note for this patient and concur with the findings and recommendations listed above.  Signed Syed Asad A. Manuella Ghazi MD Attending Physician.

## 2016-12-01 NOTE — Patient Instructions (Signed)
Ms. Julie Holland , Thank you for taking time to come for your Medicare Wellness Visit. I appreciate your ongoing commitment to your health goals. Please review the following plan we discussed and let me know if I can assist you in the future.   Screening recommendations/referrals: Colonoscopy: done 10/06/2016 Mammogram: done 10/23/16 Bone Density: done 07/20/10 Recommended yearly ophthalmology/optometry visit for glaucoma screening and checkup Recommended yearly dental visit for hygiene and checkup  Vaccinations: Influenza vaccine: done 05/27/16 Pneumococcal vaccine: completed series Tdap vaccine: cone 10/16/10 Shingles vaccine: declined today  Advanced directives: Denied setup   Conditions/risks identified: Fall risk prevention  Next appointment: 12/04/16   Preventive Care 77 Years and Older, Female Preventive care refers to lifestyle choices and visits with your health care provider that can promote health and wellness. What does preventive care include?  A yearly physical exam. This is also called an annual well check.  Dental exams once or twice a year.  Routine eye exams. Ask your health care provider how often you should have your eyes checked.  Personal lifestyle choices, including:  Daily care of your teeth and gums.  Regular physical activity.  Eating a healthy diet.  Avoiding tobacco and drug use.  Limiting alcohol use.  Practicing safe sex.  Taking low-dose aspirin every day.  Taking vitamin and mineral supplements as recommended by your health care provider. What happens during an annual well check? The services and screenings done by your health care provider during your annual well check will depend on your age, overall health, lifestyle risk factors, and family history of disease. Counseling  Your health care provider may ask you questions about your:  Alcohol use.  Tobacco use.  Drug use.  Emotional well-being.  Home and relationship  well-being.  Sexual activity.  Eating habits.  History of falls.  Memory and ability to understand (cognition).  Work and work Statistician.  Reproductive health. Screening  You may have the following tests or measurements:  Height, weight, and BMI.  Blood pressure.  Lipid and cholesterol levels. These may be checked every 5 years, or more frequently if you are over 46 years old.  Skin check.  Lung cancer screening. You may have this screening every year starting at age 51 if you have a 30-pack-year history of smoking and currently smoke or have quit within the past 15 years.  Fecal occult blood test (FOBT) of the stool. You may have this test every year starting at age 3.  Flexible sigmoidoscopy or colonoscopy. You may have a sigmoidoscopy every 5 years or a colonoscopy every 10 years starting at age 37.  Hepatitis C blood test.  Hepatitis B blood test.  Sexually transmitted disease (STD) testing.  Diabetes screening. This is done by checking your blood sugar (glucose) after you have not eaten for a while (fasting). You may have this done every 1-3 years.  Bone density scan. This is done to screen for osteoporosis. You may have this done starting at age 64.  Mammogram. This may be done every 1-2 years. Talk to your health care provider about how often you should have regular mammograms. Talk with your health care provider about your test results, treatment options, and if necessary, the need for more tests. Vaccines  Your health care provider may recommend certain vaccines, such as:  Influenza vaccine. This is recommended every year.  Tetanus, diphtheria, and acellular pertussis (Tdap, Td) vaccine. You may need a Td booster every 10 years.  Zoster vaccine. You may need this after  age 70.  Pneumococcal 13-valent conjugate (PCV13) vaccine. One dose is recommended after age 72.  Pneumococcal polysaccharide (PPSV23) vaccine. One dose is recommended after age  83. Talk to your health care provider about which screenings and vaccines you need and how often you need them. This information is not intended to replace advice given to you by your health care provider. Make sure you discuss any questions you have with your health care provider. Document Released: 09/21/2015 Document Revised: 05/14/2016 Document Reviewed: 06/26/2015 Elsevier Interactive Patient Education  2017 [place] Prevention in the Home Falls can cause injuries. They can happen to people of all ages. There are many things you can do to make your home safe and to help prevent falls. What can I do on the outside of my home?  Regularly fix the edges of walkways and driveways and fix any cracks.  Remove anything that might make you trip as you walk through a door, such as a raised step or threshold.  Trim any bushes or trees on the path to your home.  Use bright outdoor lighting.  Clear any walking paths of anything that might make someone trip, such as rocks or tools.  Regularly check to see if handrails are loose or broken. Make sure that both sides of any steps have handrails.  Any raised decks and porches should have guardrails on the edges.  Have any leaves, snow, or ice cleared regularly.  Use sand or salt on walking paths during winter.  Clean up any spills in your garage right away. This includes oil or grease spills. What can I do in the bathroom?  Use night lights.  Install grab bars by the toilet and in the tub and shower. Do not use towel bars as grab bars.  Use non-skid mats or decals in the tub or shower.  If you need to sit down in the shower, use a plastic, non-slip stool.  Keep the floor dry. Clean up any water that spills on the floor as soon as it happens.  Remove soap buildup in the tub or shower regularly.  Attach bath mats securely with double-sided non-slip rug tape.  Do not have throw rugs and other things on the floor that can make  you trip. What can I do in the bedroom?  Use night lights.  Make sure that you have a light by your bed that is easy to reach.  Do not use any sheets or blankets that are too big for your bed. They should not hang down onto the floor.  Have a firm chair that has side arms. You can use this for support while you get dressed.  Do not have throw rugs and other things on the floor that can make you trip. What can I do in the kitchen?  Clean up any spills right away.  Avoid walking on wet floors.  Keep items that you use a lot in easy-to-reach places.  If you need to reach something above you, use a strong step stool that has a grab bar.  Keep electrical cords out of the way.  Do not use floor polish or wax that makes floors slippery. If you must use wax, use non-skid floor wax.  Do not have throw rugs and other things on the floor that can make you trip. What can I do with my stairs?  Do not leave any items on the stairs.  Make sure that there are handrails on both sides of the stairs  and use them. Fix handrails that are broken or loose. Make sure that handrails are as long as the stairways.  Check any carpeting to make sure that it is firmly attached to the stairs. Fix any carpet that is loose or worn.  Avoid having throw rugs at the top or bottom of the stairs. If you do have throw rugs, attach them to the floor with carpet tape.  Make sure that you have a light switch at the top of the stairs and the bottom of the stairs. If you do not have them, ask someone to add them for you. What else can I do to help prevent falls?  Wear shoes that:  Do not have high heels.  Have rubber bottoms.  Are comfortable and fit you well.  Are closed at the toe. Do not wear sandals.  If you use a stepladder:  Make sure that it is fully opened. Do not climb a closed stepladder.  Make sure that both sides of the stepladder are locked into place.  Ask someone to hold it for you, if  possible.  Clearly mark and make sure that you can see:  Any grab bars or handrails.  First and last steps.  Where the edge of each step is.  Use tools that help you move around (mobility aids) if they are needed. These include:  Canes.  Walkers.  Scooters.  Crutches.  Turn on the lights when you go into a dark area. Replace any light bulbs as soon as they burn out.  Set up your furniture so you have a clear path. Avoid moving your furniture around.  If any of your floors are uneven, fix them.  If there are any pets around you, be aware of where they are.  Review your medicines with your doctor. Some medicines can make you feel dizzy. This can increase your chance of falling. Ask your doctor what other things that you can do to help prevent falls. This information is not intended to replace advice given to you by your health care provider. Make sure you discuss any questions you have with your health care provider. Document Released: 06/21/2009 Document Revised: 01/31/2016 Document Reviewed: 09/29/2014 Elsevier Interactive Patient Education  2017 [employer].

## 2016-12-03 ENCOUNTER — Ambulatory Visit: Payer: Medicare Other | Admitting: Family Medicine

## 2016-12-04 ENCOUNTER — Ambulatory Visit (INDEPENDENT_AMBULATORY_CARE_PROVIDER_SITE_OTHER): Payer: Medicare Other | Admitting: Family Medicine

## 2016-12-04 ENCOUNTER — Encounter: Payer: Self-pay | Admitting: Family Medicine

## 2016-12-04 ENCOUNTER — Ambulatory Visit
Admission: RE | Admit: 2016-12-04 | Discharge: 2016-12-04 | Disposition: A | Discharge status: Home / Self Care | Payer: Medicare Other | Source: Ambulatory Visit | Attending: Family Medicine | Admitting: Family Medicine

## 2016-12-04 VITALS — BP 144/80 | HR 98 | Temp 98.3°F | Resp 17 | Ht 63.0 in | Wt 118.1 lb

## 2016-12-04 DIAGNOSIS — M1712 Unilateral primary osteoarthritis, left knee: Secondary | ICD-10-CM | POA: Insufficient documentation

## 2016-12-04 DIAGNOSIS — M25472 Effusion, left ankle: Secondary | ICD-10-CM

## 2016-12-04 DIAGNOSIS — M25572 Pain in left ankle and joints of left foot: Secondary | ICD-10-CM | POA: Diagnosis not present

## 2016-12-04 DIAGNOSIS — E119 Type 2 diabetes mellitus without complications: Secondary | ICD-10-CM

## 2016-12-04 DIAGNOSIS — E7800 Pure hypercholesterolemia, unspecified: Secondary | ICD-10-CM

## 2016-12-04 DIAGNOSIS — I10 Essential (primary) hypertension: Secondary | ICD-10-CM

## 2016-12-04 DIAGNOSIS — M11262 Other chondrocalcinosis, left knee: Secondary | ICD-10-CM | POA: Insufficient documentation

## 2016-12-04 DIAGNOSIS — M25562 Pain in left knee: Secondary | ICD-10-CM | POA: Diagnosis not present

## 2016-12-04 DIAGNOSIS — S99912A Unspecified injury of left ankle, initial encounter: Secondary | ICD-10-CM | POA: Diagnosis not present

## 2016-12-04 DIAGNOSIS — M7989 Other specified soft tissue disorders: Secondary | ICD-10-CM | POA: Diagnosis not present

## 2016-12-04 LAB — POCT GLYCOSYLATED HEMOGLOBIN (HGB A1C): Hemoglobin A1C: 6.9

## 2016-12-04 MED ORDER — LISINOPRIL 2.5 MG PO TABS: 2.5000 mg | ORAL_TABLET | Freq: Every day | ORAL | 0 refills | Status: DC

## 2016-12-04 NOTE — Progress Notes (Signed)
Name: Julie Holland   MRN: [id number]    DOB: 09-Feb-1941   Date:12/04/2016       Progress Note  Subjective  Chief Complaint  Chief Complaint  Patient presents with  . Follow-up    3 mo  . Medication Refill    Diabetes  She presents for her follow-up diabetic visit. She has type 2 diabetes mellitus. Her disease course has been stable. There are no hypoglycemic associated symptoms. Pertinent negatives for hypoglycemia include no headaches. Associated symptoms include fatigue. Pertinent negatives for diabetes include no blurred vision, no chest pain, no foot paresthesias, no polydipsia and no polyuria. There are no hypoglycemic complications. There are no diabetic complications. Pertinent negatives for diabetic complications include no CVA or heart disease. Risk factors for coronary artery disease include dyslipidemia. Current diabetic treatment includes oral agent (monotherapy). Her weight is stable. She participates in exercise three times a week. Her breakfast blood glucose range is generally 110-130 mg/dl. An ACE inhibitor/angiotensin II receptor blocker is being taken.  Hyperlipidemia  This is a chronic problem. The problem is controlled. Recent lipid tests were reviewed and are normal. Pertinent negatives include no chest pain or leg pain. Current antihyperlipidemic treatment includes statins, diet change and exercise. There are no compliance problems.   Hypertension  This is a chronic problem. The problem is unchanged. The problem is uncontrolled. Pertinent negatives include no blurred vision, chest pain, headaches or palpitations. Past treatments include beta blockers. There is no history of kidney disease, CAD/MI or CVA.  Knee Pain   The injury mechanism was a fall (she has slipped a couple of times and hit her left ankle on the wooden rocking chair, following which her ankle has swollen). The pain is present in the left leg, left knee and left ankle. The quality of the pain is described as  aching. The pain is at a severity of 7/10. The pain has been intermittent since onset. She reports no foreign bodies present. The symptoms are aggravated by movement (worse with cold weather and walking).     Past Medical History:  Diagnosis Date  . Arthritis   . Asthma   . 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  . Cataract   . Chronic kidney disease    stage 1  . Diabetes mellitus    type II  . GERD (gastroesophageal reflux disease)    OCC  . Hemorrhoids   . Hyperlipidemia   . Hypertension     Past Surgical History:  Procedure Laterality Date  . CATARACT EXTRACTION, BILATERAL    . CHOLECYSTECTOMY    . COLONOSCOPY  02/13/13   Dr Bary Castilla  . EPIGASTRIC HERNIA REPAIR N/A 03/20/2015   Procedure: HERNIA REPAIR EPIGASTRIC ADULT;  Surgeon: Robert Bellow, MD;  Location: [facility] ORS;  Service: General;  Laterality: N/A;  . HEMORRHOID SURGERY N/A 03/20/2015    FOCAL HIGH-GRADE SQUAMOUS INTRAEPITHELIAL LESION (HSIL, ANAL /HEMORRHOIDECTOMY;   Robert Bellow, MD [facility] ORS;  : General;  Laterality: N/A;  . HERNIA REPAIR  July 2016   Epigastric hernia, primary repair  . TONSILLECTOMY  age 30  . TOTAL ABDOMINAL HYSTERECTOMY  01/1989  . TUBAL LIGATION    . TUMOR EXCISION N/A 07/18/2016   Procedure: EXCISION RECTAL MASS;  Surgeon: Robert Bellow, MD;  Location: [facility] ORS;  Service: General;  Laterality: N/A;    Family History  Problem Relation Age of Onset  . Bladder Cancer Mother   .  Colon cancer Father   . Lung cancer Brother   . Lymphoma Sister     Social History   Social History  . Marital status: Married    Spouse name: N/A  . Number of children: N/A  . Years of education: N/A   Occupational History  . Not on file.   Social History Main Topics  . Smoking status: Never Smoker  . Smokeless tobacco: Never Used  . Alcohol use No  . Drug use: No  . Sexual activity: Yes    Birth control/ protection:  Surgical   Other Topics Concern  . Not on file   Social History Narrative  . No narrative on file     Current Outpatient Prescriptions:  .  ACCU-CHEK SMARTVIEW test strip, USE AS DIRECTED, Disp: 100 each, Rfl: 2 .  acetaminophen (TYLENOL) 500 MG tablet, Take 500-1,000 mg by mouth every 6 (six) hours as needed (for pain)., Disp: , Rfl:  .  ADVAIR DISKUS 100-50 MCG/DOSE AEPB, USE 1 INHALATION INTO THE  LUNGS DAILY, Disp: 14 each, Rfl: 0 .  albuterol (PROVENTIL HFA;VENTOLIN HFA) 108 (90 BASE) MCG/ACT inhaler, Inhale 2 puffs into the lungs every 6 (six) hours as needed for wheezing., Disp: 6.7 g, Rfl: 11 .  aspirin 81 MG chewable tablet, Chew 81 mg by mouth daily., Disp: , Rfl:  .  Carboxymeth-Glycerin-Polysorb (REFRESH OPTIVE ADVANCED OP), Place 1 drop into both eyes 3 (three) times daily as needed (for dry/irritated eyes)., Disp: , Rfl:  .  carvedilol (COREG) 6.25 MG tablet, Take 1 tablet by mouth two  times daily with a meal, Disp: 180 tablet, Rfl: 0 .  Ferrous Gluconate (IRON 27 PO), Take 27 mg by mouth., Disp: , Rfl:  .  fluticasone (FLONASE) 50 MCG/ACT nasal spray, Place 1 spray into both nostrils daily as needed (for allergies.). , Disp: , Rfl:  .  metFORMIN (GLUCOPHAGE) 850 MG tablet, TAKE 1 TABLET BY MOUTH TWO  TIMES DAILY WITH MEALS, Disp: 180 tablet, Rfl: 0 .  Multiple Vitamin (MULTIVITAMIN) tablet, Take 2 tablets by mouth daily. , Disp: , Rfl:  .  Nutritional Supplements (ESTROVEN PO), Take 1 tablet by mouth daily., Disp: , Rfl:  .  simvastatin (ZOCOR) 20 MG tablet, Take 1 tablet (20 mg total) by mouth at bedtime., Disp: 90 tablet, Rfl: 0 .  trolamine salicylate (ASPERCREME) 10 % cream, Apply 1 application topically 3 (three) times daily as needed (for arthritis pain/hip pain.)., Disp: , Rfl:   Allergies  Allergen Reactions  . Hydrocodone Nausea And Vomiting and Other (See Comments)    Vertigo but patient takes hydrocodone/acetaminophen outpatient     Review of Systems    Constitutional: Positive for fatigue.  Eyes: Negative for blurred vision.  Cardiovascular: Negative for chest pain and palpitations.  Neurological: Negative for headaches.  Endo/Heme/Allergies: Negative for polydipsia.     Objective  Vitals:   12/04/16 1451  BP: (!) 148/81  Pulse: 98  Resp: 17  Temp: 98.3 F (36.8 C)  TempSrc: Oral  SpO2: 96%  Weight: 118 lb 1.6 oz (53.6 kg)  Height: 5\' 3"  (1.6 m)    Physical Exam  Constitutional: She is well-developed, well-nourished, and in no distress.  HENT:  Head: Normocephalic and atraumatic.  Cardiovascular: Normal rate, regular rhythm and normal heart sounds.   No murmur heard. Pulmonary/Chest: Effort normal and breath sounds normal. She has no wheezes.  Abdominal: Soft. Bowel sounds are normal. There is no tenderness.  Musculoskeletal:  Left knee: She exhibits no swelling. No tenderness found.       Left lower leg: She exhibits tenderness. She exhibits no swelling.       Feet:  Small raised density over the left lateral malleolus, mildly tender to palpation, no surrounding erythema  Psychiatric: Mood, memory, affect and judgment normal.  Nursing note and vitals reviewed.    Assessment & Plan  1. Controlled type 2 diabetes mellitus without complication, without long-term current use of insulin (HCC) Point-of-care A1c 6.9%, well-controlled diabetes, no change in pharmacotherapy - POCT HgB A1C  2. Essential hypertension  Blood pressure is elevated, add low-dose lisinopril, advised to keep BP logs for review. - lisinopril (PRINIVIL,ZESTRIL) 2.5 MG tablet; Take 1 tablet (2.5 mg total) by mouth daily.  Dispense: 90 tablet; Refill: 0  3. Pure hypercholesterolemia FLP from January 2018 reviewed, total cholesterol and LDL are at goal  4. Acute pain of left knee  - DG Knee Complete 4 Views Left; Future  5. Pain and swelling of left ankle  - DG Ankle Complete Left; Future   Lamoine Magallon Asad A. [facility] 12/04/2016 3:03 PM

## 2016-12-08 ENCOUNTER — Telehealth: Payer: Self-pay | Admitting: Family Medicine

## 2016-12-08 NOTE — Telephone Encounter (Signed)
Pt would like to know if her X Ray results are back yet? She would like results.

## 2016-12-08 NOTE — Telephone Encounter (Signed)
Pts husband would like a call back about her X rays. Pt would like to know if her handicap form could be filled out now that her xrays have been done. Pts husband was rude and was demanding a call back today. I explained to him that he would not receive a call today due to Dr Manuella Ghazi and staff being gone for the day.

## 2016-12-09 NOTE — Telephone Encounter (Signed)
Routed to Dr. Manuella Ghazi for call back

## 2016-12-10 ENCOUNTER — Other Ambulatory Visit: Payer: Self-pay | Admitting: Family Medicine

## 2016-12-10 DIAGNOSIS — M1712 Unilateral primary osteoarthritis, left knee: Secondary | ICD-10-CM | POA: Insufficient documentation

## 2016-12-10 NOTE — Telephone Encounter (Signed)
I called patient and left a voice message, x-ray of left knee and ankle have been reviewed. I'm willing to sign the handicap application once received.

## 2016-12-15 ENCOUNTER — Other Ambulatory Visit: Payer: Self-pay | Admitting: Family Medicine

## 2016-12-15 DIAGNOSIS — E119 Type 2 diabetes mellitus without complications: Secondary | ICD-10-CM

## 2016-12-17 DIAGNOSIS — M1712 Unilateral primary osteoarthritis, left knee: Secondary | ICD-10-CM | POA: Diagnosis not present

## 2016-12-29 ENCOUNTER — Inpatient Hospital Stay: Payer: Medicare Other | Attending: Hematology and Oncology

## 2016-12-29 DIAGNOSIS — E785 Hyperlipidemia, unspecified: Secondary | ICD-10-CM | POA: Diagnosis not present

## 2016-12-29 DIAGNOSIS — N189 Chronic kidney disease, unspecified: Secondary | ICD-10-CM | POA: Insufficient documentation

## 2016-12-29 DIAGNOSIS — Z79899 Other long term (current) drug therapy: Secondary | ICD-10-CM | POA: Insufficient documentation

## 2016-12-29 DIAGNOSIS — Z7984 Long term (current) use of oral hypoglycemic drugs: Secondary | ICD-10-CM | POA: Insufficient documentation

## 2016-12-29 DIAGNOSIS — D709 Neutropenia, unspecified: Secondary | ICD-10-CM

## 2016-12-29 DIAGNOSIS — E1122 Type 2 diabetes mellitus with diabetic chronic kidney disease: Secondary | ICD-10-CM | POA: Diagnosis not present

## 2016-12-29 DIAGNOSIS — J45909 Unspecified asthma, uncomplicated: Secondary | ICD-10-CM | POA: Diagnosis not present

## 2016-12-29 DIAGNOSIS — D696 Thrombocytopenia, unspecified: Secondary | ICD-10-CM | POA: Insufficient documentation

## 2016-12-29 DIAGNOSIS — K625 Hemorrhage of anus and rectum: Secondary | ICD-10-CM | POA: Insufficient documentation

## 2016-12-29 DIAGNOSIS — D72819 Decreased white blood cell count, unspecified: Secondary | ICD-10-CM | POA: Diagnosis not present

## 2016-12-29 DIAGNOSIS — I129 Hypertensive chronic kidney disease with stage 1 through stage 4 chronic kidney disease, or unspecified chronic kidney disease: Secondary | ICD-10-CM | POA: Insufficient documentation

## 2016-12-29 DIAGNOSIS — Z7982 Long term (current) use of aspirin: Secondary | ICD-10-CM | POA: Diagnosis not present

## 2016-12-29 DIAGNOSIS — K219 Gastro-esophageal reflux disease without esophagitis: Secondary | ICD-10-CM | POA: Diagnosis not present

## 2016-12-29 LAB — CBC WITH DIFFERENTIAL/PLATELET
Basophils Absolute: 0 10*3/uL (ref 0–0.1)
Basophils Relative: 0 %
Eosinophils Absolute: 0 10*3/uL (ref 0–0.7)
Eosinophils Relative: 1 %
HCT: 35.9 % (ref 35.0–47.0)
Hemoglobin: 12 g/dL (ref 12.0–16.0)
Lymphocytes Relative: 26 %
Lymphs Abs: 1 10*3/uL (ref 1.0–3.6)
MCH: 29.9 pg (ref 26.0–34.0)
MCHC: 33.4 g/dL (ref 32.0–36.0)
MCV: 89.7 fL (ref 80.0–100.0)
Monocytes Absolute: 0.9 10*3/uL (ref 0.2–0.9)
Monocytes Relative: 23 %
Neutro Abs: 2 10*3/uL (ref 1.4–6.5)
Neutrophils Relative %: 50 %
Platelets: 81 10*3/uL — ABNORMAL LOW (ref 150–400)
RBC: 4 MIL/uL (ref 3.80–5.20)
RDW: 15.8 % — ABNORMAL HIGH (ref 11.5–14.5)
WBC: 4 10*3/uL (ref 3.6–11.0)

## 2017-01-21 DIAGNOSIS — E119 Type 2 diabetes mellitus without complications: Secondary | ICD-10-CM | POA: Diagnosis not present

## 2017-01-21 DIAGNOSIS — M79674 Pain in right toe(s): Secondary | ICD-10-CM | POA: Diagnosis not present

## 2017-01-21 DIAGNOSIS — B351 Tinea unguium: Secondary | ICD-10-CM | POA: Diagnosis not present

## 2017-01-21 DIAGNOSIS — M79675 Pain in left toe(s): Secondary | ICD-10-CM | POA: Diagnosis not present

## 2017-01-22 ENCOUNTER — Other Ambulatory Visit: Payer: Self-pay | Admitting: Family Medicine

## 2017-01-22 DIAGNOSIS — I10 Essential (primary) hypertension: Secondary | ICD-10-CM

## 2017-01-24 DIAGNOSIS — I10 Essential (primary) hypertension: Secondary | ICD-10-CM | POA: Diagnosis not present

## 2017-01-24 DIAGNOSIS — R030 Elevated blood-pressure reading, without diagnosis of hypertension: Secondary | ICD-10-CM | POA: Diagnosis not present

## 2017-01-26 ENCOUNTER — Ambulatory Visit (INDEPENDENT_AMBULATORY_CARE_PROVIDER_SITE_OTHER): Payer: Medicare Other | Admitting: Family Medicine

## 2017-01-26 ENCOUNTER — Encounter: Payer: Self-pay | Admitting: Family Medicine

## 2017-01-26 VITALS — BP 139/71 | HR 97 | Temp 98.2°F | Resp 17 | Ht 63.0 in | Wt 119.5 lb

## 2017-01-26 DIAGNOSIS — E119 Type 2 diabetes mellitus without complications: Secondary | ICD-10-CM | POA: Diagnosis not present

## 2017-01-26 DIAGNOSIS — E785 Hyperlipidemia, unspecified: Secondary | ICD-10-CM | POA: Diagnosis not present

## 2017-01-26 DIAGNOSIS — I10 Essential (primary) hypertension: Secondary | ICD-10-CM | POA: Diagnosis not present

## 2017-01-26 MED ORDER — METFORMIN HCL 850 MG PO TABS: ORAL_TABLET | ORAL | 0 refills | Status: DC

## 2017-01-26 MED ORDER — CARVEDILOL 6.25 MG PO TABS: ORAL_TABLET | ORAL | 0 refills | Status: DC

## 2017-01-26 MED ORDER — SIMVASTATIN 20 MG PO TABS: 20.0000 mg | ORAL_TABLET | Freq: Every day | ORAL | 0 refills | Status: DC

## 2017-01-26 MED ORDER — LISINOPRIL 5 MG PO TABS: 5.0000 mg | ORAL_TABLET | Freq: Every day | ORAL | 0 refills | Status: DC

## 2017-01-26 NOTE — Progress Notes (Signed)
Name: Julie Holland   MRN: [id number]    DOB: [birth year]   Date:01/26/2017       Progress Note  Subjective  Chief Complaint  Chief Complaint  Patient presents with  . Hypertension    Hypertension  This is a chronic problem. The problem has been gradually worsening since onset. The problem is uncontrolled (BP was elevated to ~ 200/100 at [facility], EMT were called and pt. refused transport to ER, BP marginally improved after a few minutes. ). Pertinent negatives include no blurred vision, chest pain, headaches, palpitations or shortness of breath. Past treatments include ACE inhibitors and beta blockers. There is no history of kidney disease, CAD/MI or CVA.     Past Medical History:  Diagnosis Date  . Arthritis   . Asthma   . 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  . Cataract   . Chronic kidney disease    stage 1  . Diabetes mellitus    type II  . GERD (gastroesophageal reflux disease)    OCC  . Hemorrhoids   . Hyperlipidemia   . Hypertension     Past Surgical History:  Procedure Laterality Date  . CATARACT EXTRACTION, BILATERAL    . CHOLECYSTECTOMY    . COLONOSCOPY  02/13/13   Dr Bary Castilla  . EPIGASTRIC HERNIA REPAIR N/A 03/20/2015   Procedure: HERNIA REPAIR EPIGASTRIC ADULT;  Surgeon: Robert Bellow, MD;  Location: [facility] ORS;  Service: General;  Laterality: N/A;  . HEMORRHOID SURGERY N/A 03/20/2015    FOCAL HIGH-GRADE SQUAMOUS INTRAEPITHELIAL LESION (HSIL, ANAL /HEMORRHOIDECTOMY;   Robert Bellow, MD [facility] ORS;  : General;  Laterality: N/A;  . HERNIA REPAIR  July 2016   Epigastric hernia, primary repair  . TONSILLECTOMY  age 10  . TOTAL ABDOMINAL HYSTERECTOMY  01/1989  . TUBAL LIGATION    . TUMOR EXCISION N/A 07/18/2016   Procedure: EXCISION RECTAL MASS;  Surgeon: Robert Bellow, MD;  Location: [facility] ORS;  Service: General;  Laterality: N/A;    Family History  Problem Relation Age of Onset   . Bladder Cancer Mother   . Colon cancer Father   . Lung cancer Brother   . Lymphoma Sister     Social History   Social History  . Marital status: Married    Spouse name: N/A  . Number of children: N/A  . Years of education: N/A   Occupational History  . Not on file.   Social History Main Topics  . Smoking status: Never Smoker  . Smokeless tobacco: Never Used  . Alcohol use No  . Drug use: No  . Sexual activity: Yes    Birth control/ protection: Surgical   Other Topics Concern  . Not on file   Social History Narrative  . No narrative on file     Current Outpatient Prescriptions:  .  ACCU-CHEK SMARTVIEW test strip, USE AS DIRECTED, Disp: 200 each, Rfl: 0 .  acetaminophen (TYLENOL) 500 MG tablet, Take 500-1,000 mg by mouth every 6 (six) hours as needed (for pain)., Disp: , Rfl:  .  ADVAIR DISKUS 100-50 MCG/DOSE AEPB, USE 1 INHALATION INTO THE  LUNGS DAILY, Disp: 14 each, Rfl: 0 .  albuterol (PROVENTIL HFA;VENTOLIN HFA) 108 (90 BASE) MCG/ACT inhaler, Inhale 2 puffs into the lungs every 6 (six) hours as needed for wheezing., Disp: 6.7 g, Rfl: 11 .  aspirin 81 MG chewable tablet, Chew 81 mg by mouth daily.,  Disp: , Rfl:  .  Carboxymeth-Glycerin-Polysorb (REFRESH OPTIVE ADVANCED OP), Place 1 drop into both eyes 3 (three) times daily as needed (for dry/irritated eyes)., Disp: , Rfl:  .  carvedilol (COREG) 6.25 MG tablet, Take 1 tablet by mouth two  times daily with a meal, Disp: 180 tablet, Rfl: 0 .  Ferrous Gluconate (IRON 27 PO), Take 27 mg by mouth., Disp: , Rfl:  .  fluticasone (FLONASE) 50 MCG/ACT nasal spray, Place 1 spray into both nostrils daily as needed (for allergies.). , Disp: , Rfl:  .  lisinopril (PRINIVIL,ZESTRIL) 2.5 MG tablet, Take 1 tablet (2.5 mg total) by mouth daily., Disp: 30 tablet, Rfl: 0 .  metFORMIN (GLUCOPHAGE) 850 MG tablet, TAKE 1 TABLET BY MOUTH TWO  TIMES DAILY WITH MEALS, Disp: 180 tablet, Rfl: 0 .  Multiple Vitamin (MULTIVITAMIN) tablet, Take 2  tablets by mouth daily. , Disp: , Rfl:  .  Nutritional Supplements (ESTROVEN PO), Take 1 tablet by mouth daily., Disp: , Rfl:  .  simvastatin (ZOCOR) 20 MG tablet, Take 1 tablet (20 mg total) by mouth at bedtime., Disp: 90 tablet, Rfl: 0 .  trolamine salicylate (ASPERCREME) 10 % cream, Apply 1 application topically 3 (three) times daily as needed (for arthritis pain/hip pain.)., Disp: , Rfl:   Allergies  Allergen Reactions  . Hydrocodone Nausea And Vomiting and Other (See Comments)    Vertigo but patient takes hydrocodone/acetaminophen outpatient     Review of Systems  Constitutional: Negative for fever.  Eyes: Negative for blurred vision.  Respiratory: Negative for shortness of breath.   Cardiovascular: Negative for chest pain and palpitations.  Neurological: Negative for headaches.    Objective  Vitals:   01/26/17 1352  BP: 139/71  Pulse: 97  Resp: 17  Temp: 98.2 F (36.8 C)  TempSrc: Oral  SpO2: 96%  Weight: 119 lb 8 oz (54.2 kg)  Height: 5\' 3"  (1.6 m)    Physical Exam  Constitutional: She is oriented to person, place, and time and well-developed, well-nourished, and in no distress.  HENT:  Head: Normocephalic and atraumatic.  Cardiovascular: Regular rhythm, S1 normal and S2 normal.  Tachycardia present.   No murmur heard. Pulmonary/Chest: Breath sounds normal. No respiratory distress. She has no decreased breath sounds. She has no wheezes.  Abdominal: Soft. Bowel sounds are normal. There is no tenderness.  Musculoskeletal:       Right ankle: She exhibits swelling.       Left ankle: She exhibits swelling.  Trace pitting edema bilateral lower extremities  Neurological: She is alert and oriented to person, place, and time.  Psychiatric: Mood, memory, affect and judgment normal.  Nursing note and vitals reviewed.     Assessment & Plan   1. Essential hypertension We will increase lisinopril to 5 mg daily, continue on carvedilol without change, reassess blood  pressure in 6 weeks - lisinopril (PRINIVIL,ZESTRIL) 5 MG tablet; Take 1 tablet (5 mg total) by mouth daily.  Dispense: 90 tablet; Refill: 0 - carvedilol (COREG) 6.25 MG tablet; Take 1 tablet by mouth two  times daily with a meal  Dispense: 180 tablet; Refill: 0  2. Hyperlipidemia, unspecified hyperlipidemia type  - simvastatin (ZOCOR) 20 MG tablet; Take 1 tablet (20 mg total) by mouth at bedtime.  Dispense: 90 tablet; Refill: 0  3. Controlled type 2 diabetes mellitus without complication, without long-term current use of insulin (HCC) - metFORMIN (GLUCOPHAGE) 850 MG tablet; TAKE 1 TABLET BY MOUTH TWO  TIMES DAILY WITH MEALS  Dispense:  180 tablet; Refill: 0   Devyn Griffing [facility] 01/26/2017 2:16 PM

## 2017-01-29 ENCOUNTER — Encounter: Payer: Self-pay | Admitting: Hematology and Oncology

## 2017-01-29 ENCOUNTER — Inpatient Hospital Stay: Payer: Medicare Other | Attending: Hematology and Oncology

## 2017-01-29 ENCOUNTER — Inpatient Hospital Stay (HOSPITAL_BASED_OUTPATIENT_CLINIC_OR_DEPARTMENT_OTHER): Payer: Medicare Other | Admitting: Hematology and Oncology

## 2017-01-29 VITALS — HR 82 | Temp 97.4°F | Wt 118.1 lb

## 2017-01-29 DIAGNOSIS — E785 Hyperlipidemia, unspecified: Secondary | ICD-10-CM | POA: Insufficient documentation

## 2017-01-29 DIAGNOSIS — E1122 Type 2 diabetes mellitus with diabetic chronic kidney disease: Secondary | ICD-10-CM | POA: Diagnosis not present

## 2017-01-29 DIAGNOSIS — D696 Thrombocytopenia, unspecified: Secondary | ICD-10-CM

## 2017-01-29 DIAGNOSIS — Z7982 Long term (current) use of aspirin: Secondary | ICD-10-CM | POA: Insufficient documentation

## 2017-01-29 DIAGNOSIS — J45909 Unspecified asthma, uncomplicated: Secondary | ICD-10-CM | POA: Insufficient documentation

## 2017-01-29 DIAGNOSIS — D72819 Decreased white blood cell count, unspecified: Secondary | ICD-10-CM | POA: Insufficient documentation

## 2017-01-29 DIAGNOSIS — K625 Hemorrhage of anus and rectum: Secondary | ICD-10-CM | POA: Insufficient documentation

## 2017-01-29 DIAGNOSIS — Z79899 Other long term (current) drug therapy: Secondary | ICD-10-CM | POA: Insufficient documentation

## 2017-01-29 DIAGNOSIS — Z7984 Long term (current) use of oral hypoglycemic drugs: Secondary | ICD-10-CM | POA: Insufficient documentation

## 2017-01-29 DIAGNOSIS — N189 Chronic kidney disease, unspecified: Secondary | ICD-10-CM | POA: Insufficient documentation

## 2017-01-29 DIAGNOSIS — Z90710 Acquired absence of both cervix and uterus: Secondary | ICD-10-CM | POA: Diagnosis not present

## 2017-01-29 DIAGNOSIS — D709 Neutropenia, unspecified: Secondary | ICD-10-CM

## 2017-01-29 DIAGNOSIS — K219 Gastro-esophageal reflux disease without esophagitis: Secondary | ICD-10-CM | POA: Diagnosis not present

## 2017-01-29 DIAGNOSIS — I129 Hypertensive chronic kidney disease with stage 1 through stage 4 chronic kidney disease, or unspecified chronic kidney disease: Secondary | ICD-10-CM | POA: Insufficient documentation

## 2017-01-29 LAB — CBC WITH DIFFERENTIAL/PLATELET
Basophils Absolute: 0 10*3/uL (ref 0–0.1)
Basophils Relative: 0 %
Eosinophils Absolute: 0 10*3/uL (ref 0–0.7)
Eosinophils Relative: 0 %
HCT: 36.1 % (ref 35.0–47.0)
Hemoglobin: 12.1 g/dL (ref 12.0–16.0)
Lymphocytes Relative: 26 %
Lymphs Abs: 0.9 10*3/uL — ABNORMAL LOW (ref 1.0–3.6)
MCH: 30.1 pg (ref 26.0–34.0)
MCHC: 33.6 g/dL (ref 32.0–36.0)
MCV: 89.5 fL (ref 80.0–100.0)
Monocytes Absolute: 0.8 10*3/uL (ref 0.2–0.9)
Monocytes Relative: 24 %
Neutro Abs: 1.7 10*3/uL (ref 1.4–6.5)
Neutrophils Relative %: 50 %
Platelets: 78 10*3/uL — ABNORMAL LOW (ref 150–400)
RBC: 4.04 MIL/uL (ref 3.80–5.20)
RDW: 15.7 % — ABNORMAL HIGH (ref 11.5–14.5)
WBC: 3.4 10*3/uL — ABNORMAL LOW (ref 3.6–11.0)

## 2017-01-29 LAB — COMPREHENSIVE METABOLIC PANEL
ALT: 26 U/L (ref 14–54)
AST: 34 U/L (ref 15–41)
Albumin: 4.1 g/dL (ref 3.5–5.0)
Alkaline Phosphatase: 66 U/L (ref 38–126)
Anion gap: 4 — ABNORMAL LOW (ref 5–15)
BUN: 17 mg/dL (ref 6–20)
CO2: 31 mmol/L (ref 22–32)
Calcium: 9.4 mg/dL (ref 8.9–10.3)
Chloride: 98 mmol/L — ABNORMAL LOW (ref 101–111)
Creatinine, Ser: 0.55 mg/dL (ref 0.44–1.00)
GFR calc Af Amer: >60 mL/min (ref >60)
GFR calc non Af Amer: >60 mL/min (ref >60)
Glucose, Bld: 131 mg/dL — ABNORMAL HIGH (ref 65–99)
Potassium: 3.6 mmol/L (ref 3.5–5.1)
Sodium: 133 mmol/L — ABNORMAL LOW (ref 135–145)
Total Bilirubin: 0.8 mg/dL (ref 0.3–1.2)
Total Protein: 7.7 g/dL (ref 6.5–8.1)

## 2017-01-29 NOTE — Progress Notes (Signed)
Patient offers no concerns today.  Patient's BP elevated today.  187/97 Hr 82.  After patient was seen by MD BP increased to 194/105 Hr 80.  MD notified.  Patient saw PCP this week and Lisinopril 5 mg daily was added.  Patient asymptomatic.  Patient has been here before and had elevated BP and was sent to ED.  Per MD,  patient is instructed to go home and recheck her BP.  If not significantly better, she is to call her PCP.  Husband present at appointment today and he and the patient verbalized understanding

## 2017-01-29 NOTE — Progress Notes (Signed)
[facility] day:  01/29/2017  Chief Complaint: LEVEDA KENDRIX is a 76 y.o. female with leukopenia and thrombocytopenia who is seen for 2 1/2 month assessment.  HPI:  The patient was last seen in the hematology clinic on 11/14/2016.  At that time, she denied any complaint. She had lost 6 pounds in the past 6 months.  She denied any fevers or sweats.  She had no issues with infections.  Exam revealed no adenopathy or hepatosplenomegaly.  She declined bone marrow.  Counts were followed.  CBC on 11/28/2016 revealed a hematocrit 35.4, hemoglobin 11.8, MCV 88, platelets 77,000, white count 3400 with an ANC of 1800. Absolute lymphocyte count was 800.  CBC on 12/29/2016 revealed a hematocrit of 35.9, hemoglobin 12, MCV 89.7, platelets 81,000, white count 4000 with an ANC of 2000. Absolute lymphocyte count was 1000 (normal).  Symptomatically, she feels "ok".  She denies any B symptoms.  She has had no interval infections.   Past Medical History:  Diagnosis Date  . Arthritis   . Asthma   . 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  . Cataract   . Chronic kidney disease    stage 1  . Diabetes mellitus    type II  . GERD (gastroesophageal reflux disease)    OCC  . Hemorrhoids   . Hyperlipidemia   . Hypertension     Past Surgical History:  Procedure Laterality Date  . CATARACT EXTRACTION, BILATERAL    . CHOLECYSTECTOMY    . COLONOSCOPY  02/13/13   Dr Bary Castilla  . EPIGASTRIC HERNIA REPAIR N/A 03/20/2015   Procedure: HERNIA REPAIR EPIGASTRIC ADULT;  Surgeon: Robert Bellow, MD;  Location: [facility] ORS;  Service: General;  Laterality: N/A;  . HEMORRHOID SURGERY N/A 03/20/2015    FOCAL HIGH-GRADE SQUAMOUS INTRAEPITHELIAL LESION (HSIL, ANAL /HEMORRHOIDECTOMY;   Robert Bellow, MD [facility] ORS;  : General;  Laterality: N/A;  . HERNIA REPAIR  July 2016   Epigastric hernia, primary repair  .  TONSILLECTOMY  age 34  . TOTAL ABDOMINAL HYSTERECTOMY  01/1989  . TUBAL LIGATION    . TUMOR EXCISION N/A 07/18/2016   Procedure: EXCISION RECTAL MASS;  Surgeon: Robert Bellow, MD;  Location: [facility] ORS;  Service: General;  Laterality: N/A;    Family History  Problem Relation Age of Onset  . Bladder Cancer Mother   . Colon cancer Father   . Lung cancer Brother   . Lymphoma Sister     Social History:  reports that she has never smoked. She has never used smokeless tobacco. She reports that she does not drink alcohol or use drugs.  She lives in [place] with her husband.  She denies any exposure to radiations or toxins.  The patient is accompanied by her husband, [name], today.  Allergies:  Allergies  Allergen Reactions  . Hydrocodone Nausea And Vomiting and Other (See Comments)    Vertigo but patient takes hydrocodone/acetaminophen outpatient    Current Medications: Current Outpatient Prescriptions  Medication Sig Dispense Refill  . ACCU-CHEK SMARTVIEW test strip USE AS DIRECTED 200 each 0  . acetaminophen (TYLENOL) 500 MG tablet Take 500-1,000 mg by mouth every 6 (six) hours as needed (for pain).    . ADVAIR DISKUS 100-50 MCG/DOSE AEPB USE 1 INHALATION INTO THE  LUNGS DAILY 14 each 0  . albuterol (PROVENTIL HFA;VENTOLIN HFA) 108 (90 BASE) MCG/ACT inhaler Inhale 2 puffs into  the lungs every 6 (six) hours as needed for wheezing. 6.7 g 11  . aspirin 81 MG chewable tablet Chew 81 mg by mouth daily.    . Carboxymeth-Glycerin-Polysorb (REFRESH OPTIVE ADVANCED OP) Place 1 drop into both eyes 3 (three) times daily as needed (for dry/irritated eyes).    . carvedilol (COREG) 6.25 MG tablet Take 1 tablet by mouth two  times daily with a meal 180 tablet 0  . Ferrous Gluconate (IRON 27 PO) Take 27 mg by mouth.    . fluticasone (FLONASE) 50 MCG/ACT nasal spray Place 1 spray into both nostrils daily as needed (for allergies.).     Marland Kitchen lisinopril (PRINIVIL,ZESTRIL) 5 MG tablet Take 1 tablet (5 mg  total) by mouth daily. 90 tablet 0  . metFORMIN (GLUCOPHAGE) 850 MG tablet TAKE 1 TABLET BY MOUTH TWO  TIMES DAILY WITH MEALS 180 tablet 0  . Multiple Vitamin (MULTIVITAMIN) tablet Take 2 tablets by mouth daily.     . Nutritional Supplements (ESTROVEN PO) Take 1 tablet by mouth daily.    . simvastatin (ZOCOR) 20 MG tablet Take 1 tablet (20 mg total) by mouth at bedtime. 90 tablet 0  . trolamine salicylate (ASPERCREME) 10 % cream Apply 1 application topically 3 (three) times daily as needed (for arthritis pain/hip pain.).     No current facility-administered medications for this visit.     Review of Systems:  GENERAL:  Feels "ok".  No fevers or sweats.  Weight up 1 pound since last visit. PERFORMANCE STATUS (ECOG):  0 HEENT:  No visual changes, runny nose, sore throat, mouth sores or tenderness. Lungs: No shortness of breath or cough.  No hemoptysis. Cardiac:  No chest pain, palpitations, orthopnea, or PND. GI:  Occasional diarrhea secondary to cholecystectomy.  No nausea, vomiting, constipation, melena or hematochezia. GU:  No urgency, frequency, dysuria, or hematuria. Musculoskeletal:  No back pain.  Arthritis in hands.  No muscle tenderness. Extremities:  No pain or swelling. Skin:  No rashes or skin changes. Neuro:  No headache, numbness or weakness, balance or coordination issues. Endocrine: Diabetes.  No thyroid issues, hot flashes or night sweats. Psych:  No anxiety or depression. Pain:  No focal pain. Review of systems:  All other systems reviewed and found to be negative.  Physical Exam: Pulse 82, temperature 97.4 F (36.3 C), temperature source Tympanic, weight 118 lb 2 oz (53.6 kg). GENERAL:  Thin woman sitting comfortably in the exam room in no acute distress. MENTAL STATUS:  Alert and oriented to person, place and time. HEAD:  Short gray hair.  Normocephalic, atraumatic, face symmetric, no Cushingoid features. EYES:  Pupils equal round and reactive to light and  accomodation.  No conjunctivitis or scleral icterus. ENT:  Oropharynx clear without lesion.  Tongue normal. Mucous membranes moist.  RESPIRATORY:  Clear to auscultation without rales, wheezes or rhonchi. CARDIOVASCULAR:  Regular rate and rhythm without murmur, rub or gallop. ABDOMEN:  Soft, non-tender, with active bowel sounds, and no hepatosplenomegaly.  No masses. SKIN:  No rashes, ulcers or lesions. EXTREMITIES:  Left ankle edema.  No skin discoloration or tenderness.  No palpable cords. LYMPH NODES: No palpable cervical, supraclavicular, axillary or inguinal adenopathy  NEUROLOGICAL: Unremarkable. PSYCH:  Appropriate.   Appointment on 01/29/2017  Component Date Value Ref Range Status  . WBC 01/29/2017 3.4* 3.6 - 11.0 K/uL Final  . RBC 01/29/2017 4.04  3.80 - 5.20 MIL/uL Final  . Hemoglobin 01/29/2017 12.1  12.0 - 16.0 g/dL Final  . HCT 01/29/2017  36.1  35.0 - 47.0 % Final  . MCV 01/29/2017 89.5  80.0 - 100.0 fL Final  . MCH 01/29/2017 30.1  26.0 - 34.0 pg Final  . MCHC 01/29/2017 33.6  32.0 - 36.0 g/dL Final  . RDW 01/29/2017 15.7* 11.5 - 14.5 % Final  . Platelets 01/29/2017 78* 150 - 400 K/uL Final   Comment: PLATELET COUNT CONFIRMED BY SMEAR PLATELETS VARY IN SIZE LARGE BODY PLATLETS PRESENT FEW GIANT PLATELTES SEEN   . Neutrophils Relative % 01/29/2017 50  % Final  . Neutro Abs 01/29/2017 1.7  1.4 - 6.5 K/uL Final  . Lymphocytes Relative 01/29/2017 26  % Final  . Lymphs Abs 01/29/2017 0.9* 1.0 - 3.6 K/uL Final  . Monocytes Relative 01/29/2017 24  % Final  . Monocytes Absolute 01/29/2017 0.8  0.2 - 0.9 K/uL Final  . Eosinophils Relative 01/29/2017 0  % Final  . Eosinophils Absolute 01/29/2017 0.0  0 - 0.7 K/uL Final  . Basophils Relative 01/29/2017 0  % Final  . Basophils Absolute 01/29/2017 0.0  0 - 0.1 K/uL Final  . Sodium 01/29/2017 133* 135 - 145 mmol/L Final  . Potassium 01/29/2017 3.6  3.5 - 5.1 mmol/L Final  . Chloride 01/29/2017 98* 101 - 111 mmol/L Final  . CO2  01/29/2017 31  22 - 32 mmol/L Final  . Glucose, Bld 01/29/2017 131* 65 - 99 mg/dL Final  . BUN 01/29/2017 17  6 - 20 mg/dL Final  . Creatinine, Ser 01/29/2017 0.55  0.44 - 1.00 mg/dL Final  . Calcium 01/29/2017 9.4  8.9 - 10.3 mg/dL Final  . Total Protein 01/29/2017 7.7  6.5 - 8.1 g/dL Final  . Albumin 01/29/2017 4.1  3.5 - 5.0 g/dL Final  . AST 01/29/2017 34  15 - 41 U/L Final  . ALT 01/29/2017 26  14 - 54 U/L Final  . Alkaline Phosphatase 01/29/2017 66  38 - 126 U/L Final  . Total Bilirubin 01/29/2017 0.8  0.3 - 1.2 mg/dL Final  . GFR calc non Af Amer 01/29/2017 >60  >60 mL/min Final  . GFR calc Af Amer 01/29/2017 >60  >60 mL/min Final   Comment: (NOTE) The eGFR has been calculated using the CKD EPI equation. This calculation has not been validated in all clinical situations. eGFR's persistently <60 mL/min signify possible Chronic Kidney Disease.   . Anion gap 01/29/2017 4* 5 - 15 Final    Assessment:  MARIMAR SUBER is a 76 y.o. female with leukopenia and thrombocytopenia of unclear etiology.  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  She denies any new medications or herbal products.  She denies any quinine water.  Her diet appears good.   Labs dating back to 05/06/2011 revealed intermittent leukopenia with a WBC ranging from 2600- 6600.  ANC has been > 1000.  Platelet count was intermittently low before 08/01/2013.  Since 03/13/2015, she has had thrombocytopenia < 100,000.  Hematocrit has been normal, except with her episode of rectal bleeding in 07/2016.   Work-up on 10/30/2016 revealed a hematocrit of 36.2, hemoglobin 12.1, MCV 88.1, platelets 70,000, white count 3100 with an ANC of 1800.  Differential was unremarkable.  Normal studies included:  B12, folate, TSH, ANA, hepatitis B core antibody, hepatitis C antibody, HIV antibody, copper , and PTT.  Peripheral smear revealed giant platelets. Red blood cell and white cell morphology were unremarkable.  She has a history  of rectal bleeding requiring 2 units of PRBCs on 07/31/2016.  She underwent oversewing of  a bleeding arterial vessel at the site of previous mucosal resection.   She has taken oral iron since her surgery.  Symptomatically, she has lost 7 pounds in the past 8 months.  She denies any fevers or sweats.  She has no issues with infections.  Exam reveals no adenopathy or hepatosplenomegaly.  Plan: 1.  Labs today:  CBC with diff, CMP. 2   Discuss counts.  Etiology remains unclear.  Discuss possible myelodysplastic syndrome.  She defers a bone marrow aspirate and biopsy.  Continue to monitor. 3.  RTC in 3 months for MD assess and labs (CBC with diff, CMP).   Lequita Asal, MD  01/29/2017, 11:01 AM

## 2017-02-27 ENCOUNTER — Telehealth: Payer: Self-pay | Admitting: Family Medicine

## 2017-02-27 NOTE — Telephone Encounter (Signed)
David from [facility] needing clarification on lisinopril 5mg  tab, lisinopril 2.5mg  tab and lisinopril htcz  [phone] Reference #: [id number]

## 2017-03-02 ENCOUNTER — Telehealth: Payer: Self-pay | Admitting: Family Medicine

## 2017-03-02 DIAGNOSIS — I160 Hypertensive urgency: Secondary | ICD-10-CM | POA: Diagnosis not present

## 2017-03-02 DIAGNOSIS — H6122 Impacted cerumen, left ear: Secondary | ICD-10-CM | POA: Diagnosis not present

## 2017-03-02 DIAGNOSIS — I10 Essential (primary) hypertension: Secondary | ICD-10-CM

## 2017-03-02 NOTE — Telephone Encounter (Signed)
Pt states Lisinopril 5mg  that was called into [facility] her insurance would not cover it. She is asking for a new RX to be sent to [facility]. Pt has ran out.

## 2017-03-03 MED ORDER — LISINOPRIL 5 MG PO TABS: 5.0000 mg | ORAL_TABLET | Freq: Every day | ORAL | 0 refills | Status: DC

## 2017-03-03 NOTE — Addendum Note (Signed)
Addended by: Barnie Alderman L on: 03/03/2017 11:27 AM   Modules accepted: Orders

## 2017-03-03 NOTE — Telephone Encounter (Signed)
Medication has been refilled and sent to [facility]

## 2017-03-08 ENCOUNTER — Emergency Department: Payer: Medicare Other

## 2017-03-08 ENCOUNTER — Emergency Department
Admission: EM | Admit: 2017-03-08 | Discharge: 2017-03-08 | Disposition: A | Discharge status: Home / Self Care | Payer: Medicare Other | Attending: Emergency Medicine | Admitting: Emergency Medicine

## 2017-03-08 DIAGNOSIS — S0101XA Laceration without foreign body of scalp, initial encounter: Secondary | ICD-10-CM | POA: Diagnosis not present

## 2017-03-08 DIAGNOSIS — Z7984 Long term (current) use of oral hypoglycemic drugs: Secondary | ICD-10-CM | POA: Diagnosis not present

## 2017-03-08 DIAGNOSIS — E1122 Type 2 diabetes mellitus with diabetic chronic kidney disease: Secondary | ICD-10-CM | POA: Diagnosis not present

## 2017-03-08 DIAGNOSIS — Y939 Activity, unspecified: Secondary | ICD-10-CM | POA: Diagnosis not present

## 2017-03-08 DIAGNOSIS — W19XXXA Unspecified fall, initial encounter: Secondary | ICD-10-CM | POA: Diagnosis not present

## 2017-03-08 DIAGNOSIS — Z23 Encounter for immunization: Secondary | ICD-10-CM | POA: Diagnosis not present

## 2017-03-08 DIAGNOSIS — Y999 Unspecified external cause status: Secondary | ICD-10-CM | POA: Insufficient documentation

## 2017-03-08 DIAGNOSIS — Z85048 Personal history of other malignant neoplasm of rectum, rectosigmoid junction, and anus: Secondary | ICD-10-CM | POA: Diagnosis not present

## 2017-03-08 DIAGNOSIS — N181 Chronic kidney disease, stage 1: Secondary | ICD-10-CM | POA: Diagnosis not present

## 2017-03-08 DIAGNOSIS — Z7951 Long term (current) use of inhaled steroids: Secondary | ICD-10-CM | POA: Diagnosis not present

## 2017-03-08 DIAGNOSIS — T148XXA Other injury of unspecified body region, initial encounter: Secondary | ICD-10-CM | POA: Diagnosis not present

## 2017-03-08 DIAGNOSIS — J45909 Unspecified asthma, uncomplicated: Secondary | ICD-10-CM | POA: Insufficient documentation

## 2017-03-08 DIAGNOSIS — W109XXA Fall (on) (from) unspecified stairs and steps, initial encounter: Secondary | ICD-10-CM | POA: Insufficient documentation

## 2017-03-08 DIAGNOSIS — I129 Hypertensive chronic kidney disease with stage 1 through stage 4 chronic kidney disease, or unspecified chronic kidney disease: Secondary | ICD-10-CM | POA: Insufficient documentation

## 2017-03-08 DIAGNOSIS — T07XXXA Unspecified multiple injuries, initial encounter: Secondary | ICD-10-CM

## 2017-03-08 DIAGNOSIS — S0181XA Laceration without foreign body of other part of head, initial encounter: Secondary | ICD-10-CM | POA: Diagnosis not present

## 2017-03-08 DIAGNOSIS — Y92019 Unspecified place in single-family (private) house as the place of occurrence of the external cause: Secondary | ICD-10-CM | POA: Diagnosis not present

## 2017-03-08 DIAGNOSIS — S20212A Contusion of left front wall of thorax, initial encounter: Secondary | ICD-10-CM | POA: Diagnosis not present

## 2017-03-08 DIAGNOSIS — S300XXA Contusion of lower back and pelvis, initial encounter: Secondary | ICD-10-CM | POA: Diagnosis not present

## 2017-03-08 DIAGNOSIS — S0990XA Unspecified injury of head, initial encounter: Secondary | ICD-10-CM | POA: Diagnosis not present

## 2017-03-08 DIAGNOSIS — I4510 Unspecified right bundle-branch block: Secondary | ICD-10-CM | POA: Diagnosis not present

## 2017-03-08 DIAGNOSIS — D649 Anemia, unspecified: Secondary | ICD-10-CM | POA: Diagnosis not present

## 2017-03-08 DIAGNOSIS — Y92009 Unspecified place in unspecified non-institutional (private) residence as the place of occurrence of the external cause: Secondary | ICD-10-CM

## 2017-03-08 LAB — CBC WITH DIFFERENTIAL/PLATELET
Basophils Absolute: 0 10*3/uL (ref 0–0.1)
Basophils Relative: 0 %
Eosinophils Absolute: 0 10*3/uL (ref 0–0.7)
Eosinophils Relative: 0 %
HCT: 31.5 % — ABNORMAL LOW (ref 35.0–47.0)
Hemoglobin: 10.5 g/dL — ABNORMAL LOW (ref 12.0–16.0)
Lymphocytes Relative: 18 %
Lymphs Abs: 0.8 10*3/uL — ABNORMAL LOW (ref 1.0–3.6)
MCH: 29.9 pg (ref 26.0–34.0)
MCHC: 33.4 g/dL (ref 32.0–36.0)
MCV: 89.5 fL (ref 80.0–100.0)
Monocytes Absolute: 1.1 10*3/uL — ABNORMAL HIGH (ref 0.2–0.9)
Monocytes Relative: 23 %
Neutro Abs: 2.8 10*3/uL (ref 1.4–6.5)
Neutrophils Relative %: 59 %
Platelets: 65 10*3/uL — ABNORMAL LOW (ref 150–440)
RBC: 3.52 MIL/uL — ABNORMAL LOW (ref 3.80–5.20)
RDW: 15.7 % — ABNORMAL HIGH (ref 11.5–14.5)
WBC: 4.8 10*3/uL (ref 3.6–11.0)

## 2017-03-08 LAB — BASIC METABOLIC PANEL
Anion gap: 9 (ref 5–15)
BUN: 16 mg/dL (ref 6–20)
CO2: 25 mmol/L (ref 22–32)
Calcium: 9.1 mg/dL (ref 8.9–10.3)
Chloride: 98 mmol/L — ABNORMAL LOW (ref 101–111)
Creatinine, Ser: 0.45 mg/dL (ref 0.44–1.00)
GFR calc Af Amer: >60 mL/min (ref >60)
GFR calc non Af Amer: >60 mL/min (ref >60)
Glucose, Bld: 146 mg/dL — ABNORMAL HIGH (ref 65–99)
Potassium: 3.7 mmol/L (ref 3.5–5.1)
Sodium: 132 mmol/L — ABNORMAL LOW (ref 135–145)

## 2017-03-08 LAB — URINALYSIS, COMPLETE (UACMP) WITH MICROSCOPIC
Bacteria, UA: NONE SEEN
Bilirubin Urine: NEGATIVE
Glucose, UA: 50 mg/dL — AB
Hgb urine dipstick: NEGATIVE
Ketones, ur: 5 mg/dL — AB
Leukocytes, UA: NEGATIVE
Nitrite: NEGATIVE
Protein, ur: 30 mg/dL — AB
Specific Gravity, Urine: 1.02 (ref 1.005–1.030)
pH: 5 (ref 5.0–8.0)

## 2017-03-08 LAB — TROPONIN I: Troponin I: <0.03 ng/mL (ref <0.03)

## 2017-03-08 MED ORDER — TETANUS-DIPHTH-ACELL PERTUSSIS 5-2.5-18.5 LF-MCG/0.5 IM SUSP
0.5000 mL | Freq: Once | INTRAMUSCULAR | Status: AC
  Administered 2017-03-08: 0.5 mL via INTRAMUSCULAR
  Filled 2017-03-08: qty 0.5

## 2017-03-08 NOTE — Discharge Instructions (Signed)
No serious injuries suspected after your fall, he did have multiple bruises and EDU are anemic today. Please follow-up with a primary care doctor for continued evaluation for this.  Return to the emergency department immediately for any new or worsening pain, weakness, numbness, confusion altered mental status, or any other symptoms concerning to you.  1 staple needs to be removed in about 10 days at your primary doctor's office.  Wait 24 hours to wash hair.

## 2017-03-08 NOTE — ED Provider Notes (Signed)
[facility] Emergency Department Provider Note ____________________________________________   I have reviewed the triage vital signs and the triage nursing note.  HISTORY  Chief Complaint Fall   Historian Patient and husband  HPI Julie Holland is a 76 y.o. female from home after fall today on stairs.  Was trying to come up with cane in hand and missed the rail and fell, striking back of head with some bleeding.  No loc or syncope.  No chest pain.  No neck pain.  No weakness or numnbess.  No recent illnesses.  She did have a fall on Thursday, where she tripped and struck her right buttock and has a large old bruise there now. She has been walking and is not concerned about bony injury.  She does have some bruising to her left forearm and left rib area without any bony point tenderness there from today.    Past Medical History:  Diagnosis Date  . Arthritis   . Asthma   . 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  . Cataract   . Chronic kidney disease    stage 1  . Diabetes mellitus    type II  . GERD (gastroesophageal reflux disease)    OCC  . Hemorrhoids   . Hyperlipidemia   . Hypertension     Patient Active Problem List   Diagnosis Date Noted  . Arthritis of left knee 12/10/2016  . Neutropenia ([place]) 10/20/2016  . Thrombocytopenia ([place]) 10/20/2016  . Postoperative hemorrhage involving digestive system following digestive system procedure 08/04/2016  . Rectal bleeding 07/31/2016  . 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 Asthma 08/17/2015  . Umbilical hernia without obstruction and without gangrene 02/27/2015  . 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 Routine general medical examination at a health care facility 03/15/2013  . 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 Shoulder pain 11/23/2012  . Right bundle branch block 11/11/2012   . Diabetes mellitus, controlled ([place])   . Hyperlipidemia   . Hypertension     Past Surgical History:  Procedure Laterality Date  . CATARACT EXTRACTION, BILATERAL    . CHOLECYSTECTOMY    . COLONOSCOPY  02/13/13   Dr Bary Castilla  . EPIGASTRIC HERNIA REPAIR N/A 03/20/2015   Procedure: HERNIA REPAIR EPIGASTRIC ADULT;  Surgeon: Robert Bellow, MD;  Location: [facility] ORS;  Service: General;  Laterality: N/A;  . HEMORRHOID SURGERY N/A 03/20/2015    FOCAL HIGH-GRADE SQUAMOUS INTRAEPITHELIAL LESION (HSIL, ANAL /HEMORRHOIDECTOMY;   Robert Bellow, MD [facility] ORS;  : General;  Laterality: N/A;  . HERNIA REPAIR  July 2016   Epigastric hernia, primary repair  . TONSILLECTOMY  age 59  . TOTAL ABDOMINAL HYSTERECTOMY  01/1989  . TUBAL LIGATION    . TUMOR EXCISION N/A 07/18/2016   Procedure: EXCISION RECTAL MASS;  Surgeon: Robert Bellow, MD;  Location: [facility] ORS;  Service: General;  Laterality: N/A;    Prior to Admission medications   Medication Sig Start Date End Date Taking? Authorizing Provider  ACCU-CHEK SMARTVIEW test strip USE AS DIRECTED 12/15/16   Roselee Nova, MD  acetaminophen (TYLENOL) 500 MG tablet Take 500-1,000 mg by mouth every 6 (six) hours as needed (for pain).    [provider]  ADVAIR DISKUS 100-50 MCG/DOSE AEPB USE 1 INHALATION INTO THE  LUNGS DAILY 09/11/16   Roselee Nova, MD  albuterol (  PROVENTIL HFA;VENTOLIN HFA) 108 (90 BASE) MCG/ACT inhaler Inhale 2 puffs into the lungs every 6 (six) hours as needed for wheezing. 03/14/13   Crecencio Mc, MD  aspirin 81 MG chewable tablet Chew 81 mg by mouth daily.    [provider]  Carboxymeth-Glycerin-Polysorb (REFRESH OPTIVE ADVANCED OP) Place 1 drop into both eyes 3 (three) times daily as needed (for dry/irritated eyes).    [provider]  carvedilol (COREG) 6.25 MG tablet Take 1 tablet by mouth two  times daily with a meal 01/26/17   Roselee Nova, MD  Ferrous Gluconate (IRON 27 PO) Take 27 mg by  mouth.    [provider]  fluticasone (FLONASE) 50 MCG/ACT nasal spray Place 1 spray into both nostrils daily as needed (for allergies.).     [provider]  lisinopril (PRINIVIL,ZESTRIL) 5 MG tablet Take 1 tablet (5 mg total) by mouth daily. 03/03/17   Roselee Nova, MD  metFORMIN (GLUCOPHAGE) 850 MG tablet TAKE 1 TABLET BY MOUTH TWO  TIMES DAILY WITH MEALS 01/26/17   Roselee Nova, MD  Multiple Vitamin (MULTIVITAMIN) tablet Take 2 tablets by mouth daily.     [provider]  Nutritional Supplements (ESTROVEN PO) Take 1 tablet by mouth daily.    [provider]  simvastatin (ZOCOR) 20 MG tablet Take 1 tablet (20 mg total) by mouth at bedtime. 01/26/17   Roselee Nova, MD  trolamine salicylate (ASPERCREME) 10 % cream Apply 1 application topically 3 (three) times daily as needed (for arthritis pain/hip pain.).    [provider]    Allergies  Allergen Reactions  . Hydrocodone Nausea And Vomiting and Other (See Comments)    Vertigo but patient takes hydrocodone/acetaminophen outpatient    Family History  Problem Relation Age of Onset  . Bladder Cancer Mother   . Colon cancer Father   . Lung cancer Brother   . Lymphoma Sister     Social History Social History  Substance Use Topics  . Smoking status: Never Smoker  . Smokeless tobacco: Never Used  . Alcohol use No    Review of Systems  Constitutional: Negative for fever. Eyes: Negative for visual changes. ENT: Negative for sore throat. Cardiovascular: Negative for chest pain. Respiratory: Negative for shortness of breath. Gastrointestinal: Negative for abdominal pain, vomiting and diarrhea. Genitourinary: Negative for dysuria. Musculoskeletal: Negative for back pain. Skin: Negative for rash. Neurological: Negative for headache.  ____________________________________________   PHYSICAL EXAM:  VITAL SIGNS: ED Triage Vitals  Enc Vitals Group     BP 03/08/17 1332 (!)  177/88     Pulse Rate 03/08/17 1332 84     Resp --      Temp 03/08/17 1332 98.1 F (36.7 C)     Temp Source 03/08/17 1332 Oral     SpO2 03/08/17 1332 96 %     Weight 03/08/17 1320 117 lb (53.1 kg)     Height 03/08/17 1320 5\' 3"  (1.6 m)     Head Circumference --      Peak Flow --      Pain Score 03/08/17 1319 7     Pain Loc --      Pain Edu? --      Excl. in [place]? --      Constitutional: Alert and oriented. Well appearing and in no distress. HEENT   Head: Normocephalic.  Small blood and scab to the posterior scalp, hemostatic.      Eyes:  Conjunctivae are normal. Pupils equal and round.       Ears:         Nose: No congestion/rhinnorhea.   Mouth/Throat: Mucous membranes are moist.   Neck: No stridor.  No posterior C-spine tenderness to palpation or range of motion. Cardiovascular/Chest: Normal rate, regular rhythm.  No murmurs, rubs, or gallops. Respiratory: Normal respiratory effort without tachypnea nor retractions. Breath sounds are clear and equal bilaterally. No wheezes/rales/rhonchi. Gastrointestinal: Soft. No distention, no guarding, no rebound. Nontender.  Genitourinary/rectal:Deferred Musculoskeletal: Nontender chest wall to palpation anterior and laterally. She does have a bruise to the left lateral posterior rib wall without bony tenderness. She has left forearm hematoma without bony tenderness. She has an old appearing right buttock bruise which is mildly tender, but no hip tenderness or hip pain with palpation or range of motion. She is an old appearing bruise to her right shin. She is a new appearing small bruise to her left shin without any bony point tenderness. Nontender with normal range of motion in all extremities. No joint effusions.  No lower extremity tenderness.  No edema. Neurologic:  Normal speech and language. No gross or focal neurologic deficits are appreciated. Skin:  Skin is warm, dry and intact. No rash noted. Psychiatric: Mood and affect are  normal. Speech and behavior are normal. Patient exhibits appropriate insight and judgment.   ____________________________________________  LABS (pertinent positives/negatives)  Labs Reviewed  BASIC METABOLIC PANEL - Abnormal; Notable for the following:       Result Value   Sodium 132 (*)    Chloride 98 (*)    Glucose, Bld 146 (*)    All other components within normal limits  CBC WITH DIFFERENTIAL/PLATELET - Abnormal; Notable for the following:    RBC 3.52 (*)    Hemoglobin 10.5 (*)    HCT 31.5 (*)    RDW 15.7 (*)    Platelets 65 (*)    Lymphs Abs 0.8 (*)    Monocytes Absolute 1.1 (*)    All other components within normal limits  URINALYSIS, COMPLETE (UACMP) WITH MICROSCOPIC - Abnormal; Notable for the following:    Color, Urine AMBER (*)    APPearance HAZY (*)    Glucose, UA 50 (*)    Ketones, ur 5 (*)    Protein, ur 30 (*)    Squamous Epithelial / LPF 6-30 (*)    All other components within normal limits  TROPONIN I    ____________________________________________    EKG I, Lisa Roca, MD, the attending physician have personally viewed and interpreted all ECGs.  76 bpm. Normal sinus rhythm. Narrow QRS. Normal axis. Right bundle blanch block. Similar to prior EKG. ____________________________________________  RADIOLOGY All Xrays were viewed by me. Imaging interpreted by Radiologist.  CT head without contrast:  IMPRESSION: No acute or traumatic finding.  Mild age related atrophy. __________________________________________  PROCEDURES  Procedure(s) performed: None  Critical Care performed: None  ____________________________________________   ED COURSE / ASSESSMENT AND PLAN  Pertinent labs & imaging results that were available during my care of the patient were reviewed by me and considered in my medical decision making (see chart for details).   Ms. Charlott Holler had what sounds like a mechanical fall getting tripped up between the stairs railing and her  cane. She did strike her head without loss of consciousness and is denying any neck pain and has no distracting pain and I was able to clear her C-spine clinically here.  From the standpoint of the traumatic injury  today as well as Thursday, I'm going to CT her head given the laceration and she is clearly an easy bruiser and is on aspirin.  No other high concern for bony abnormality today or fall from Thursday although she has multiple bruises, she does not have any trouble breathing or abdominal pain or bony point tenderness anywhere else.  In terms of why she's fallen twice in the last week, this is fairly uncommon for her, however she reports that she thinks she just got tripped up both times. Next line next I discussed with her obtaining some screening laboratory studies as well as urinalysis and she is okay with this.  We discussed the new anemia, likely from large hematoma from a fall a few days ago. In any case have asked her to follow with her primary care doctor and/or hematologist, possibly be back on iron. No history concerning for GI bleeding or intrathoracic or intra-abdominal bleeding in terms of trauma.  [place] updated.   CONSULTATIONS:   None  Patient / Family / Caregiver informed of clinical course, medical decision-making process, and agree with plan.   I discussed return precautions, follow-up instructions, and discharge instructions with patient and/or family.  Discharge Instructions : No serious injuries suspected after your fall, he did have multiple bruises and EDU are anemic today. Please follow-up with a primary care doctor for continued evaluation for this.  Return to the emergency department immediately for any new or worsening pain, weakness, numbness, confusion altered mental status, or any other symptoms concerning to you.  1 staple needs to be removed in about 10 days at your primary doctor's office.  Wait 24 hours to wash  hair.  ___________________________________________   FINAL CLINICAL IMPRESSION(S) / ED DIAGNOSES   Final diagnoses:  Laceration of scalp, initial encounter  Fall in home, initial encounter  Multiple contusions  Anemia, unspecified type              Note: This dictation was prepared with Dragon dictation. Any transcriptional errors that result from this process are unintentional    Lisa Roca, MD 03/08/17 [phone]

## 2017-03-08 NOTE — ED Triage Notes (Signed)
Per EMS pt was walking down her back steps and fell down 3 stairs.  Pt denies LOC.  Pt has a hematoma on left forearm and a laceration on the back of her head.  Pt also has bruising on right buttock and left back.  Pt is alert and oriented x4.  Pt states pain 6/10.

## 2017-03-09 ENCOUNTER — Ambulatory Visit (INDEPENDENT_AMBULATORY_CARE_PROVIDER_SITE_OTHER): Payer: Medicare Other | Admitting: Family Medicine

## 2017-03-09 ENCOUNTER — Encounter: Payer: Self-pay | Admitting: Family Medicine

## 2017-03-09 VITALS — BP 140/72 | HR 97 | Temp 98.2°F | Resp 14 | Wt 120.2 lb

## 2017-03-09 DIAGNOSIS — E119 Type 2 diabetes mellitus without complications: Secondary | ICD-10-CM | POA: Diagnosis not present

## 2017-03-09 DIAGNOSIS — I10 Essential (primary) hypertension: Secondary | ICD-10-CM

## 2017-03-09 DIAGNOSIS — J4540 Moderate persistent asthma, uncomplicated: Secondary | ICD-10-CM | POA: Diagnosis not present

## 2017-03-09 DIAGNOSIS — E7800 Pure hypercholesterolemia, unspecified: Secondary | ICD-10-CM | POA: Diagnosis not present

## 2017-03-09 DIAGNOSIS — D62 Acute posthemorrhagic anemia: Secondary | ICD-10-CM

## 2017-03-09 LAB — POCT GLYCOSYLATED HEMOGLOBIN (HGB A1C): Hemoglobin A1C: 6.5

## 2017-03-09 MED ORDER — FLUTICASONE-SALMETEROL 100-50 MCG/DOSE IN AEPB: INHALATION_SPRAY | RESPIRATORY_TRACT | 0 refills | Status: DC

## 2017-03-09 NOTE — Progress Notes (Signed)
Name: Julie Holland   MRN: [id number]    DOB: August 21, 1941   Date:03/09/2017       Progress Note  Subjective  Chief Complaint  Chief Complaint  Patient presents with  . Follow-up    6 weeks   . Diabetes  . Medication Refill    inhaler    Diabetes  She presents for her follow-up diabetic visit. She has type 2 diabetes mellitus. Her disease course has been improving. There are no hypoglycemic associated symptoms. Pertinent negatives for hypoglycemia include no headaches. Pertinent negatives for diabetes include no blurred vision, no chest pain, no fatigue, no foot paresthesias, no polydipsia and no polyuria. Pertinent negatives for diabetic complications include no CVA. Current diabetic treatment includes oral agent (monotherapy). Her weight is stable. She is following a diabetic and generally healthy diet. She rarely participates in exercise. She monitors blood glucose at home 1-2 x per day. Her breakfast blood glucose range is generally 140-180 mg/dl. An ACE inhibitor/angiotensin II receptor blocker is being taken. Eye exam is current.  Hypertension  This is a chronic problem. The problem is unchanged. The problem is controlled. Pertinent negatives include no blurred vision, chest pain, headaches, palpitations or shortness of breath. Past treatments include beta blockers and ACE inhibitors. There is no history of kidney disease, CAD/MI or CVA.  Asthma  There is no chest tightness, cough, difficulty breathing or shortness of breath. This is a chronic problem. The problem has been unchanged. Pertinent negatives include no chest pain, dyspnea on exertion, headaches or myalgias. Her symptoms are aggravated by change in weather (humidity). Her past medical history is significant for asthma.  Hyperlipidemia  This is a chronic problem. The problem is controlled. Recent lipid tests were reviewed and are normal. Pertinent negatives include no chest pain, leg pain, myalgias or shortness of breath. Current  antihyperlipidemic treatment includes statins.     Past Medical History:  Diagnosis Date  . Arthritis   . Asthma   . 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  . Cataract   . Chronic kidney disease    stage 1  . Diabetes mellitus    type II  . GERD (gastroesophageal reflux disease)    OCC  . Hemorrhoids   . Hyperlipidemia   . Hypertension     Past Surgical History:  Procedure Laterality Date  . CATARACT EXTRACTION, BILATERAL    . CHOLECYSTECTOMY    . COLONOSCOPY  02/13/13   Dr Bary Castilla  . EPIGASTRIC HERNIA REPAIR N/A 03/20/2015   Procedure: HERNIA REPAIR EPIGASTRIC ADULT;  Surgeon: Robert Bellow, MD;  Location: [facility] ORS;  Service: General;  Laterality: N/A;  . HEMORRHOID SURGERY N/A 03/20/2015    FOCAL HIGH-GRADE SQUAMOUS INTRAEPITHELIAL LESION (HSIL, ANAL /HEMORRHOIDECTOMY;   Robert Bellow, MD [facility] ORS;  : General;  Laterality: N/A;  . HERNIA REPAIR  July 2016   Epigastric hernia, primary repair  . TONSILLECTOMY  age 64  . TOTAL ABDOMINAL HYSTERECTOMY  01/1989  . TUBAL LIGATION    . TUMOR EXCISION N/A 07/18/2016   Procedure: EXCISION RECTAL MASS;  Surgeon: Robert Bellow, MD;  Location: [facility] ORS;  Service: General;  Laterality: N/A;    Family History  Problem Relation Age of Onset  . Bladder Cancer Mother   . Colon cancer Father   . Lung cancer Brother   . Lymphoma Sister     Social History   Social History  . Marital  status: Married    Spouse name: N/A  . Number of children: N/A  . Years of education: N/A   Occupational History  . Not on file.   Social History Main Topics  . Smoking status: Never Smoker  . Smokeless tobacco: Never Used  . Alcohol use No  . Drug use: No  . Sexual activity: Yes    Birth control/ protection: Surgical   Other Topics Concern  . Not on file   Social History Narrative  . No narrative on file     Current Outpatient Prescriptions:  .  ACCU-CHEK  SMARTVIEW test strip, USE AS DIRECTED, Disp: 200 each, Rfl: 0 .  acetaminophen (TYLENOL) 500 MG tablet, Take 500-1,000 mg by mouth every 6 (six) hours as needed (for pain)., Disp: , Rfl:  .  ADVAIR DISKUS 100-50 MCG/DOSE AEPB, USE 1 INHALATION INTO THE  LUNGS DAILY, Disp: 14 each, Rfl: 0 .  albuterol (PROVENTIL HFA;VENTOLIN HFA) 108 (90 BASE) MCG/ACT inhaler, Inhale 2 puffs into the lungs every 6 (six) hours as needed for wheezing., Disp: 6.7 g, Rfl: 11 .  aspirin 81 MG chewable tablet, Chew 81 mg by mouth daily., Disp: , Rfl:  .  Carboxymeth-Glycerin-Polysorb (REFRESH OPTIVE ADVANCED OP), Place 1 drop into both eyes 3 (three) times daily as needed (for dry/irritated eyes)., Disp: , Rfl:  .  carvedilol (COREG) 6.25 MG tablet, Take 1 tablet by mouth two  times daily with a meal, Disp: 180 tablet, Rfl: 0 .  Ferrous Gluconate (IRON 27 PO), Take 27 mg by mouth., Disp: , Rfl:  .  lisinopril (PRINIVIL,ZESTRIL) 5 MG tablet, Take 1 tablet (5 mg total) by mouth daily., Disp: 90 tablet, Rfl: 0 .  metFORMIN (GLUCOPHAGE) 850 MG tablet, TAKE 1 TABLET BY MOUTH TWO  TIMES DAILY WITH MEALS, Disp: 180 tablet, Rfl: 0 .  Multiple Vitamin (MULTIVITAMIN) tablet, Take 2 tablets by mouth daily. , Disp: , Rfl:  .  Nutritional Supplements (ESTROVEN PO), Take 1 tablet by mouth daily., Disp: , Rfl:  .  simvastatin (ZOCOR) 20 MG tablet, Take 1 tablet (20 mg total) by mouth at bedtime., Disp: 90 tablet, Rfl: 0  Allergies  Allergen Reactions  . Hydrocodone Nausea And Vomiting and Other (See Comments)    Vertigo but patient takes hydrocodone/acetaminophen outpatient     Review of Systems  Constitutional: Negative for fatigue.  Eyes: Negative for blurred vision.  Respiratory: Negative for cough and shortness of breath.   Cardiovascular: Negative for chest pain, dyspnea on exertion and palpitations.  Musculoskeletal: Negative for myalgias.  Neurological: Negative for headaches.  Endo/Heme/Allergies: Negative for  polydipsia.     Objective  Vitals:   03/09/17 1334  BP: 140/72  Pulse: 97  Resp: 14  Temp: 98.2 F (36.8 C)  TempSrc: Oral  SpO2: 98%  Weight: 120 lb 3.2 oz (54.5 kg)    Physical Exam  Constitutional: She is oriented to person, place, and time and well-developed, well-nourished, and in no distress.  HENT:  Head: Normocephalic and atraumatic.  Cardiovascular: Normal rate, regular rhythm and normal heart sounds.   No murmur heard. Pulmonary/Chest: Effort normal and breath sounds normal. She has no wheezes.  Abdominal: Soft. Bowel sounds are normal. There is no tenderness.  Musculoskeletal: She exhibits no edema.  Neurological: She is alert and oriented to person, place, and time.  Psychiatric: Mood, memory, affect and judgment normal.  Nursing note and vitals reviewed.     Assessment & Plan  1. Controlled type 2 diabetes mellitus  without complication, without long-term current use of insulin (HCC) Point-of-care A1c 6.5%, well-controlled diabetes - POCT HgB A1C - Urine Microalbumin w/creat. ratio  2. Moderate persistent asthma without complication  - Fluticasone-Salmeterol (ADVAIR DISKUS) 100-50 MCG/DOSE AEPB; USE 1 INHALATION INTO THE  LUNGS DAILY  Dispense: 1 each; Refill: 0  3. Anemia associated with acute blood loss Likely from blood loss due to hematoma sustained after head injury., Reviewed iron studies and start on oral iron - Iron, TIBC and Ferritin Panel - Retic  4. Essential hypertension BP stable on present anti- hypertensive therapy  5. Pure hypercholesterolemia  - Lipid panel   Yostin Malacara Asad A[facility] 03/09/2017 1:57 PM

## 2017-03-10 DIAGNOSIS — H6123 Impacted cerumen, bilateral: Secondary | ICD-10-CM | POA: Diagnosis not present

## 2017-03-10 DIAGNOSIS — H60333 Swimmer's ear, bilateral: Secondary | ICD-10-CM | POA: Diagnosis not present

## 2017-03-16 ENCOUNTER — Emergency Department
Admission: EM | Admit: 2017-03-16 | Discharge: 2017-03-16 | Disposition: A | Discharge status: Home / Self Care | Payer: Medicare Other | Attending: Emergency Medicine | Admitting: Emergency Medicine

## 2017-03-16 ENCOUNTER — Other Ambulatory Visit: Payer: Self-pay | Admitting: Family Medicine

## 2017-03-16 ENCOUNTER — Encounter: Payer: Self-pay | Admitting: Emergency Medicine

## 2017-03-16 DIAGNOSIS — Z79899 Other long term (current) drug therapy: Secondary | ICD-10-CM | POA: Diagnosis not present

## 2017-03-16 DIAGNOSIS — Z7984 Long term (current) use of oral hypoglycemic drugs: Secondary | ICD-10-CM | POA: Diagnosis not present

## 2017-03-16 DIAGNOSIS — I130 Hypertensive heart and chronic kidney disease with heart failure and stage 1 through stage 4 chronic kidney disease, or unspecified chronic kidney disease: Secondary | ICD-10-CM | POA: Diagnosis not present

## 2017-03-16 DIAGNOSIS — I10 Essential (primary) hypertension: Secondary | ICD-10-CM

## 2017-03-16 DIAGNOSIS — Z4802 Encounter for removal of sutures: Secondary | ICD-10-CM

## 2017-03-16 DIAGNOSIS — J45909 Unspecified asthma, uncomplicated: Secondary | ICD-10-CM | POA: Insufficient documentation

## 2017-03-16 DIAGNOSIS — Z7982 Long term (current) use of aspirin: Secondary | ICD-10-CM | POA: Insufficient documentation

## 2017-03-16 DIAGNOSIS — E119 Type 2 diabetes mellitus without complications: Secondary | ICD-10-CM | POA: Insufficient documentation

## 2017-03-16 DIAGNOSIS — E785 Hyperlipidemia, unspecified: Secondary | ICD-10-CM

## 2017-03-16 DIAGNOSIS — D62 Acute posthemorrhagic anemia: Secondary | ICD-10-CM | POA: Diagnosis not present

## 2017-03-16 DIAGNOSIS — E7800 Pure hypercholesterolemia, unspecified: Secondary | ICD-10-CM | POA: Diagnosis not present

## 2017-03-16 DIAGNOSIS — I509 Heart failure, unspecified: Secondary | ICD-10-CM | POA: Insufficient documentation

## 2017-03-16 DIAGNOSIS — I129 Hypertensive chronic kidney disease with stage 1 through stage 4 chronic kidney disease, or unspecified chronic kidney disease: Secondary | ICD-10-CM | POA: Diagnosis not present

## 2017-03-16 DIAGNOSIS — D013 Carcinoma in situ of anus and anal canal: Secondary | ICD-10-CM | POA: Diagnosis not present

## 2017-03-16 DIAGNOSIS — J4540 Moderate persistent asthma, uncomplicated: Secondary | ICD-10-CM

## 2017-03-16 DIAGNOSIS — N181 Chronic kidney disease, stage 1: Secondary | ICD-10-CM | POA: Diagnosis not present

## 2017-03-16 LAB — RETICULOCYTES
ABS Retic: 129240 cells/uL — ABNORMAL HIGH (ref 20000–80000)
RBC.: 3.59 MIL/uL — ABNORMAL LOW (ref 3.80–5.10)
Retic Ct Pct: 3.6 %

## 2017-03-16 NOTE — ED Provider Notes (Signed)
[facility] Emergency Department Provider Note   ____________________________________________   First MD Initiated Contact with Patient 03/16/17 [phone]     (approximate)  I have reviewed the triage vital signs and the nursing notes.   HISTORY  Chief Complaint Suture / Staple Removal    HPI Julie Holland is a 76 y.o. female patient here for staple removal from scalp secondary to laceration8 days ago. Patient has no complaints at this time.   Past Medical History:  Diagnosis Date  . Arthritis   . Asthma   . 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  . Cataract   . Chronic kidney disease    stage 1  . Diabetes mellitus    type II  . GERD (gastroesophageal reflux disease)    OCC  . Hemorrhoids   . Hyperlipidemia   . Hypertension     Patient Active Problem List   Diagnosis Date Noted  . Anemia associated with acute blood loss 03/09/2017  . Arthritis of left knee 12/10/2016  . Neutropenia ([place]) 10/20/2016  . Thrombocytopenia ([place]) 10/20/2016  . Postoperative hemorrhage involving digestive system following digestive system procedure 08/04/2016  . Rectal bleeding 07/31/2016  . 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 Asthma 08/17/2015  . Umbilical hernia without obstruction and without gangrene 02/27/2015  . 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 Routine general medical examination at a health care facility 03/15/2013  . 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 Shoulder pain 11/23/2012  . Right bundle branch block 11/11/2012  . Diabetes mellitus, controlled ([place])   . Hyperlipidemia   . Hypertension     Past Surgical History:  Procedure Laterality Date  . CATARACT EXTRACTION, BILATERAL    . CHOLECYSTECTOMY    . COLONOSCOPY  02/13/13   Dr Bary Castilla  . EPIGASTRIC HERNIA REPAIR N/A 03/20/2015   Procedure: HERNIA REPAIR  EPIGASTRIC ADULT;  Surgeon: Robert Bellow, MD;  Location: [facility] ORS;  Service: General;  Laterality: N/A;  . HEMORRHOID SURGERY N/A 03/20/2015    FOCAL HIGH-GRADE SQUAMOUS INTRAEPITHELIAL LESION (HSIL, ANAL /HEMORRHOIDECTOMY;   Robert Bellow, MD [facility] ORS;  : General;  Laterality: N/A;  . HERNIA REPAIR  July 2016   Epigastric hernia, primary repair  . TONSILLECTOMY  age 63  . TOTAL ABDOMINAL HYSTERECTOMY  01/1989  . TUBAL LIGATION    . TUMOR EXCISION N/A 07/18/2016   Procedure: EXCISION RECTAL MASS;  Surgeon: Robert Bellow, MD;  Location: [facility] ORS;  Service: General;  Laterality: N/A;    Prior to Admission medications   Medication Sig Start Date End Date Taking? Authorizing Provider  ACCU-CHEK SMARTVIEW test strip USE AS DIRECTED 12/15/16   Roselee Nova, MD  acetaminophen (TYLENOL) 500 MG tablet Take 500-1,000 mg by mouth every 6 (six) hours as needed (for pain).    [provider]  albuterol (PROVENTIL HFA;VENTOLIN HFA) 108 (90 BASE) MCG/ACT inhaler Inhale 2 puffs into the lungs every 6 (six) hours as needed for wheezing. 03/14/13   Crecencio Mc, MD  aspirin 81 MG chewable tablet Chew 81 mg by mouth daily.    [provider]  Carboxymeth-Glycerin-Polysorb (REFRESH OPTIVE ADVANCED OP) Place 1 drop into both eyes 3 (three) times daily as needed (for dry/irritated eyes).    [provider]  carvedilol (COREG) 6.25 MG tablet Take 1 tablet by mouth two  times  daily with a meal 01/26/17   Roselee Nova, MD  Ferrous Gluconate (IRON 27 PO) Take 27 mg by mouth.    [provider]  Fluticasone-Salmeterol (ADVAIR DISKUS) 100-50 MCG/DOSE AEPB USE 1 INHALATION INTO THE  LUNGS DAILY 03/09/17   Roselee Nova, MD  Fluticasone-Salmeterol (ADVAIR DISKUS) 100-50 MCG/DOSE AEPB USE 1 INHALATION INTO THE  LUNGS DAILY 03/09/17   Roselee Nova, MD  lisinopril (PRINIVIL,ZESTRIL) 5 MG tablet Take 1 tablet (5 mg total) by mouth daily. 03/03/17   Roselee Nova,  MD  metFORMIN (GLUCOPHAGE) 850 MG tablet TAKE 1 TABLET BY MOUTH TWO  TIMES DAILY WITH MEALS 01/26/17   Roselee Nova, MD  Multiple Vitamin (MULTIVITAMIN) tablet Take 2 tablets by mouth daily.     [provider]  Nutritional Supplements (ESTROVEN PO) Take 1 tablet by mouth daily.    [provider]  simvastatin (ZOCOR) 20 MG tablet Take 1 tablet (20 mg total) by mouth at bedtime. 01/26/17   Roselee Nova, MD    Allergies Hydrocodone  Family History  Problem Relation Age of Onset  . Bladder Cancer Mother   . Colon cancer Father   . Lung cancer Brother   . Lymphoma Sister     Social History Social History  Substance Use Topics  . Smoking status: Never Smoker  . Smokeless tobacco: Never Used  . Alcohol use No    Review of Systems  Constitutional: No fever/chills Eyes: No visual changes. ENT: No sore throat. Cardiovascular: Denies chest pain. Respiratory: Denies shortness of breath. Gastrointestinal: No abdominal pain.  No nausea, no vomiting.  No diarrhea.  No constipation. Genitourinary: Negative for dysuria. Musculoskeletal: Negative for back pain. Skin: Negative for rash. Neurological: Negative for headaches, focal weakness or numbness. Endocrine:Diabetes, hypertension, and hyperlipidemia. Allergic/Immunilogical: Hydrocodone  ____________________________________________   PHYSICAL EXAM:  VITAL SIGNS: ED Triage Vitals  Enc Vitals Group     BP 03/16/17 0905 (!) 166/83     Pulse Rate 03/16/17 0905 89     Resp 03/16/17 0905 16     Temp 03/16/17 0905 98.6 F (37 C)     Temp Source 03/16/17 0905 Oral     SpO2 03/16/17 0905 97 %     Weight 03/16/17 0904 120 lb (54.4 kg)     Height 03/16/17 0904 5\' 3"  (1.6 m)     Head Circumference --      Peak Flow --      Pain Score --      Pain Loc --      Pain Edu? --      Excl. in [place]? --     Constitutional: Alert and oriented. Well appearing and in no acute distress. Eyes: Conjunctivae are  normal. PERRL. EOMI. Head: Atraumatic.Scalp laceration Neck: No stridor.   Cardiovascular: Normal rate, regular rhythm. Grossly normal heart sounds.  Good peripheral circulation. Elevated blood pressure Respiratory: Normal respiratory effort.  No retractions. Lungs CTAB. Gastrointestinal: Soft and nontender. No distention. No abdominal bruits. No CVA tenderness. Musculoskeletal: No lower extremity tenderness nor edema.  No joint effusions. Neurologic:  Normal speech and language. No gross focal neurologic deficits are appreciated. No gait instability. Skin:  Skin is warm, dry and intact. No rash noted. Healed scalp laceration Psychiatric: Mood and affect are normal. Speech and behavior are normal.  ____________________________________________   LABS (all labs ordered are listed, but only abnormal results are displayed)  Labs Reviewed - No data to  display ____________________________________________  EKG   ____________________________________________  RADIOLOGY  No results found.  ____________________________________________   PROCEDURES  Procedure(s) performed: None  Procedures  Critical Care performed: No  ____________________________________________   INITIAL IMPRESSION / ASSESSMENT AND PLAN / ED COURSE  Pertinent labs & imaging results that were available during my care of the patient were reviewed by me and considered in my medical decision making (see chart for details).  Staple removal secondary to the scalp laceration. Patient given discharge care instructions. Patient to follow her PCP as needed.      ____________________________________________   FINAL CLINICAL IMPRESSION(S) / ED DIAGNOSES  Final diagnoses:  Removal of staples      NEW MEDICATIONS STARTED DURING THIS VISIT:  New Prescriptions   No medications on file     Note:  This document was prepared using Dragon voice recognition software and may include unintentional dictation  errors.    Sable Feil, PA-C 03/16/17 Emmaline Life    Nance Pear, MD 03/16/17 1200

## 2017-03-16 NOTE — ED Triage Notes (Signed)
Pt here for staple to scalp that was placed 03/08/17. Denies problems or sx infection r/t staple. Ambulatory to triage.

## 2017-03-17 LAB — LIPID PANEL
Cholesterol: 111 mg/dL (ref <200)
HDL: 47 mg/dL — ABNORMAL LOW (ref >50)
LDL Cholesterol: 49 mg/dL (ref <100)
Total CHOL/HDL Ratio: 2.4 Ratio (ref <5.0)
Triglycerides: 76 mg/dL (ref <150)
VLDL: 15 mg/dL (ref <30)

## 2017-03-17 LAB — MICROALBUMIN / CREATININE URINE RATIO
Creatinine, Urine: 42 mg/dL (ref 20–320)
Microalb Creat Ratio: 129 mcg/mg creat — ABNORMAL HIGH (ref <30)
Microalb, Ur: 5.4 mg/dL

## 2017-03-17 LAB — IRON,TIBC AND FERRITIN PANEL
%SAT: 30 % (ref 11–50)
Ferritin: 65 ng/mL (ref 20–288)
Iron: 122 ug/dL (ref 45–160)
TIBC: 408 ug/dL (ref 250–450)

## 2017-03-18 DIAGNOSIS — H60331 Swimmer's ear, right ear: Secondary | ICD-10-CM | POA: Diagnosis not present

## 2017-04-11 ENCOUNTER — Other Ambulatory Visit: Payer: Self-pay | Admitting: Family Medicine

## 2017-04-11 DIAGNOSIS — I10 Essential (primary) hypertension: Secondary | ICD-10-CM

## 2017-04-11 DIAGNOSIS — E119 Type 2 diabetes mellitus without complications: Secondary | ICD-10-CM

## 2017-04-23 ENCOUNTER — Emergency Department
Admission: EM | Admit: 2017-04-23 | Discharge: 2017-04-23 | Disposition: A | Discharge status: Home / Self Care | Payer: Medicare Other | Attending: Emergency Medicine | Admitting: Emergency Medicine

## 2017-04-23 ENCOUNTER — Encounter: Payer: Self-pay | Admitting: Emergency Medicine

## 2017-04-23 DIAGNOSIS — S81812A Laceration without foreign body, left lower leg, initial encounter: Secondary | ICD-10-CM | POA: Insufficient documentation

## 2017-04-23 DIAGNOSIS — Y93F9 Activity, other caregiving: Secondary | ICD-10-CM | POA: Diagnosis not present

## 2017-04-23 DIAGNOSIS — E1122 Type 2 diabetes mellitus with diabetic chronic kidney disease: Secondary | ICD-10-CM | POA: Insufficient documentation

## 2017-04-23 DIAGNOSIS — Z79899 Other long term (current) drug therapy: Secondary | ICD-10-CM | POA: Insufficient documentation

## 2017-04-23 DIAGNOSIS — J45909 Unspecified asthma, uncomplicated: Secondary | ICD-10-CM | POA: Diagnosis not present

## 2017-04-23 DIAGNOSIS — Y929 Unspecified place or not applicable: Secondary | ICD-10-CM | POA: Insufficient documentation

## 2017-04-23 DIAGNOSIS — N181 Chronic kidney disease, stage 1: Secondary | ICD-10-CM | POA: Insufficient documentation

## 2017-04-23 DIAGNOSIS — W19XXXA Unspecified fall, initial encounter: Secondary | ICD-10-CM | POA: Insufficient documentation

## 2017-04-23 DIAGNOSIS — Y999 Unspecified external cause status: Secondary | ICD-10-CM | POA: Insufficient documentation

## 2017-04-23 DIAGNOSIS — S51012A Laceration without foreign body of left elbow, initial encounter: Secondary | ICD-10-CM

## 2017-04-23 DIAGNOSIS — I129 Hypertensive chronic kidney disease with stage 1 through stage 4 chronic kidney disease, or unspecified chronic kidney disease: Secondary | ICD-10-CM | POA: Insufficient documentation

## 2017-04-23 DIAGNOSIS — Z7982 Long term (current) use of aspirin: Secondary | ICD-10-CM | POA: Insufficient documentation

## 2017-04-23 DIAGNOSIS — Z7984 Long term (current) use of oral hypoglycemic drugs: Secondary | ICD-10-CM | POA: Insufficient documentation

## 2017-04-23 MED ORDER — BACITRACIN-NEOMYCIN-POLYMYXIN 400-5-5000 EX OINT
TOPICAL_OINTMENT | Freq: Once | CUTANEOUS | Status: DC
  Filled 2017-04-23: qty 1

## 2017-04-23 NOTE — ED Notes (Signed)
Area cleansed with saline  Neosporin and pressure dressing applied with ace wrap

## 2017-04-23 NOTE — ED Provider Notes (Signed)
[facility] Emergency Department Provider Note  ____________________________________________  Time seen: Approximately 11:46 AM  I have reviewed the triage vital signs and the nursing notes.   HISTORY  Chief Complaint Skin Tear    HPI Julie Holland is a 76 y.o. female that presents to the emergency department with a skin tear to left leg for 3 days that continues to ooze blood. She has been applying neosporin. She fell while she was helping her husband and hit her leg. She did not hit her head or lose consciousness.She takes an aspirin daily. Last tetanus shot was in July. No fever, SOB, CP, nausea, vomiting, abdominal pain.   Past Medical History:  Diagnosis Date  . Arthritis   . Asthma   . 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  . Cataract   . Chronic kidney disease    stage 1  . Diabetes mellitus    type II  . GERD (gastroesophageal reflux disease)    OCC  . Hemorrhoids   . Hyperlipidemia   . Hypertension     Patient Active Problem List   Diagnosis Date Noted  . Anemia associated with acute blood loss 03/09/2017  . Arthritis of left knee 12/10/2016  . Neutropenia ([place]) 10/20/2016  . Thrombocytopenia ([place]) 10/20/2016  . Postoperative hemorrhage involving digestive system following digestive system procedure 08/04/2016  . Rectal bleeding 07/31/2016  . 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 Asthma 08/17/2015  . Umbilical hernia without obstruction and without gangrene 02/27/2015  . 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 Routine general medical examination at a health care facility 03/15/2013  . 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 Shoulder pain 11/23/2012  . Right bundle branch block 11/11/2012  . Diabetes mellitus, controlled ([place])   . Hyperlipidemia   . Hypertension     Past Surgical History:  Procedure  Laterality Date  . CATARACT EXTRACTION, BILATERAL    . CHOLECYSTECTOMY    . COLONOSCOPY  02/13/13   Dr Bary Castilla  . EPIGASTRIC HERNIA REPAIR N/A 03/20/2015   Procedure: HERNIA REPAIR EPIGASTRIC ADULT;  Surgeon: Robert Bellow, MD;  Location: [facility] ORS;  Service: General;  Laterality: N/A;  . HEMORRHOID SURGERY N/A 03/20/2015    FOCAL HIGH-GRADE SQUAMOUS INTRAEPITHELIAL LESION (HSIL, ANAL /HEMORRHOIDECTOMY;   Robert Bellow, MD [facility] ORS;  : General;  Laterality: N/A;  . HERNIA REPAIR  July 2016   Epigastric hernia, primary repair  . TONSILLECTOMY  age 42  . TOTAL ABDOMINAL HYSTERECTOMY  01/1989  . TUBAL LIGATION    . TUMOR EXCISION N/A 07/18/2016   Procedure: EXCISION RECTAL MASS;  Surgeon: Robert Bellow, MD;  Location: [facility] ORS;  Service: General;  Laterality: N/A;    Prior to Admission medications   Medication Sig Start Date End Date Taking? Authorizing Provider  ACCU-CHEK SMARTVIEW test strip USE AS DIRECTED 04/11/17   Roselee Nova, MD  acetaminophen (TYLENOL) 500 MG tablet Take 500-1,000 mg by mouth every 6 (six) hours as needed (for pain).    [provider]  albuterol (PROVENTIL HFA;VENTOLIN HFA) 108 (90 BASE) MCG/ACT inhaler Inhale 2 puffs into the lungs every 6 (six) hours as needed for wheezing. 03/14/13   Crecencio Mc, MD  aspirin 81 MG chewable tablet Chew 81 mg by mouth daily.    [provider]  Carboxymeth-Glycerin-Polysorb (REFRESH OPTIVE ADVANCED OP) Place 1  drop into both eyes 3 (three) times daily as needed (for dry/irritated eyes).    [provider]  carvedilol (COREG) 6.25 MG tablet Take 1 tablet (6.25 mg total) by mouth 2 (two) times daily with a meal. 03/16/17   Lada, Satira Anis, MD  Ferrous Gluconate (IRON 27 PO) Take 27 mg by mouth.    [provider]  Fluticasone-Salmeterol (ADVAIR DISKUS) 100-50 MCG/DOSE AEPB Inhale 1 puff into the lungs 2 (two) times daily. 03/16/17   [name], MD  lisinopril (PRINIVIL,ZESTRIL) 5 MG  tablet Take 1 tablet (5 mg total) by mouth daily. 03/03/17   Roselee Nova, MD  metFORMIN (GLUCOPHAGE) 850 MG tablet TAKE 1 TABLET BY MOUTH TWO  TIMES DAILY WITH MEALS 03/16/17   Lada, Satira Anis, MD  Multiple Vitamin (MULTIVITAMIN) tablet Take 2 tablets by mouth daily.     [provider]  Nutritional Supplements (ESTROVEN PO) Take 1 tablet by mouth daily.    [provider]  simvastatin (ZOCOR) 20 MG tablet TAKE 1 TABLET BY MOUTH AT  BEDTIME 03/16/17   Lada, Satira Anis, MD    Allergies Hydrocodone  Family History  Problem Relation Age of Onset  . Bladder Cancer Mother   . Colon cancer Father   . Lung cancer Brother   . Lymphoma Sister     Social History Social History  Substance Use Topics  . Smoking status: Never Smoker  . Smokeless tobacco: Never Used  . Alcohol use No     Review of Systems  Constitutional: No fever/chills Cardiovascular: No chest pain. Respiratory: No SOB. Gastrointestinal: No abdominal pain.  No nausea, no vomiting.  Musculoskeletal: Negative for musculoskeletal pain. Neurological: Negative for headaches   ____________________________________________   PHYSICAL EXAM:  VITAL SIGNS: ED Triage Vitals [04/23/17 1111]  Enc Vitals Group     BP (!) 172/91     Pulse Rate 92     Resp 18     Temp 98.6 F (37 C)     Temp Source Oral     SpO2 97 %     Weight 117 lb (53.1 kg)     Height      Head Circumference      Peak Flow      Pain Score      Pain Loc      Pain Edu?      Excl. in [place]?      Constitutional: Alert and oriented. Well appearing and in no acute distress. Eyes: Conjunctivae are normal. PERRL. EOMI. Head: Atraumatic. ENT:      Ears:      Nose: No congestion/rhinnorhea.      Mouth/Throat: Mucous membranes are moist.  Neck: No stridor.  Cardiovascular: Normal rate, regular rhythm.  Good peripheral circulation. Respiratory: Normal respiratory effort without tachypnea or retractions. Lungs CTAB. Good air entry to  the bases with no decreased or absent breath sounds. Musculoskeletal: Full range of motion to all extremities. No gross deformities appreciated. Neurologic:  Normal speech and language. No gross focal neurologic deficits are appreciated.  Skin:  Skin is warm, dry. 1 cm skin tear to lower right leg with minimal bleeding. No surrounding erythema or swelling.   ____________________________________________   LABS (all labs ordered are listed, but only abnormal results are displayed)  Labs Reviewed - No data to display ____________________________________________  EKG   ____________________________________________  RADIOLOGY  No results found.  ____________________________________________    PROCEDURES  Procedure(s) performed:    Procedures  Medications  neomycin-bacitracin-polymyxin (NEOSPORIN) ointment (not administered)     ____________________________________________   INITIAL IMPRESSION / ASSESSMENT AND PLAN / ED COURSE  Pertinent labs & imaging results that were available during my care of the patient were reviewed by me and considered in my medical decision making (see chart for details).  Review of the [place] CSRS was performed in accordance of the [place] prior to dispensing any controlled drugs.   Patient's diagnosis is consistent with skin tear. Vital signs and exam are reassuring. Nonadherent and pressure dressing was applied. Wound care education was given. She will continue taking aspirin. Tetanus shot is up to date. Patient is to follow up with PCP as directed. Patient is given ED precautions to return to the ED for any worsening or new symptoms.     ____________________________________________  FINAL CLINICAL IMPRESSION(S) / ED DIAGNOSES  Final diagnoses:  Skin tear of left elbow without complication, initial encounter      NEW MEDICATIONS STARTED DURING THIS VISIT:  Discharge Medication List as of 04/23/2017 11:50 AM          This  chart was dictated using voice recognition software/Dragon. Despite best efforts to proofread, errors can occur which can change the meaning. Any change was purely unintentional.    Laban Emperor, PA-C 04/23/17 1224    Darel Hong, MD 04/23/17 [phone]

## 2017-04-23 NOTE — ED Notes (Signed)
See triage note  States she hit her left leg on deck couple of days ago   Skin tear noted to lower leg  And conts to ooze blood

## 2017-04-23 NOTE — ED Triage Notes (Signed)
Pt reports abrasion to left lower leg several days ago. Pt reports has begun to heal and look better but continues to ooze blood. Denies taking blood thinners. Skin tear noted to left lower leg that is oozing blood. Pt denies pain. Ambulatory to triage. No apparent distress noted.

## 2017-04-30 ENCOUNTER — Inpatient Hospital Stay (HOSPITAL_BASED_OUTPATIENT_CLINIC_OR_DEPARTMENT_OTHER): Payer: Medicare Other | Admitting: Hematology and Oncology

## 2017-04-30 ENCOUNTER — Encounter: Payer: Self-pay | Admitting: Hematology and Oncology

## 2017-04-30 ENCOUNTER — Inpatient Hospital Stay: Payer: Medicare Other | Attending: Hematology and Oncology

## 2017-04-30 VITALS — BP 155/79 | HR 88 | Temp 98.4°F | Resp 18 | Wt 119.4 lb

## 2017-04-30 DIAGNOSIS — Z90710 Acquired absence of both cervix and uterus: Secondary | ICD-10-CM | POA: Insufficient documentation

## 2017-04-30 DIAGNOSIS — K625 Hemorrhage of anus and rectum: Secondary | ICD-10-CM | POA: Diagnosis not present

## 2017-04-30 DIAGNOSIS — D649 Anemia, unspecified: Secondary | ICD-10-CM | POA: Insufficient documentation

## 2017-04-30 DIAGNOSIS — D696 Thrombocytopenia, unspecified: Secondary | ICD-10-CM | POA: Insufficient documentation

## 2017-04-30 DIAGNOSIS — D709 Neutropenia, unspecified: Secondary | ICD-10-CM

## 2017-04-30 DIAGNOSIS — Z7984 Long term (current) use of oral hypoglycemic drugs: Secondary | ICD-10-CM | POA: Diagnosis not present

## 2017-04-30 DIAGNOSIS — E785 Hyperlipidemia, unspecified: Secondary | ICD-10-CM | POA: Diagnosis not present

## 2017-04-30 DIAGNOSIS — K219 Gastro-esophageal reflux disease without esophagitis: Secondary | ICD-10-CM | POA: Insufficient documentation

## 2017-04-30 DIAGNOSIS — E1122 Type 2 diabetes mellitus with diabetic chronic kidney disease: Secondary | ICD-10-CM | POA: Insufficient documentation

## 2017-04-30 DIAGNOSIS — Z79899 Other long term (current) drug therapy: Secondary | ICD-10-CM | POA: Diagnosis not present

## 2017-04-30 DIAGNOSIS — I129 Hypertensive chronic kidney disease with stage 1 through stage 4 chronic kidney disease, or unspecified chronic kidney disease: Secondary | ICD-10-CM | POA: Insufficient documentation

## 2017-04-30 DIAGNOSIS — Z7982 Long term (current) use of aspirin: Secondary | ICD-10-CM | POA: Insufficient documentation

## 2017-04-30 DIAGNOSIS — J45909 Unspecified asthma, uncomplicated: Secondary | ICD-10-CM | POA: Diagnosis not present

## 2017-04-30 DIAGNOSIS — D72819 Decreased white blood cell count, unspecified: Secondary | ICD-10-CM | POA: Diagnosis not present

## 2017-04-30 DIAGNOSIS — N189 Chronic kidney disease, unspecified: Secondary | ICD-10-CM | POA: Diagnosis not present

## 2017-04-30 LAB — COMPREHENSIVE METABOLIC PANEL WITH GFR
ALT: 27 U/L (ref 14–54)
AST: 34 U/L (ref 15–41)
Albumin: 4.3 g/dL (ref 3.5–5.0)
Alkaline Phosphatase: 71 U/L (ref 38–126)
Anion gap: 9 (ref 5–15)
BUN: 19 mg/dL (ref 6–20)
CO2: 28 mmol/L (ref 22–32)
Calcium: 9.3 mg/dL (ref 8.9–10.3)
Chloride: 97 mmol/L — ABNORMAL LOW (ref 101–111)
Creatinine, Ser: 0.52 mg/dL (ref 0.44–1.00)
GFR calc Af Amer: >60 mL/min
GFR calc non Af Amer: >60 mL/min
Glucose, Bld: 160 mg/dL — ABNORMAL HIGH (ref 65–99)
Potassium: 3.7 mmol/L (ref 3.5–5.1)
Sodium: 134 mmol/L — ABNORMAL LOW (ref 135–145)
Total Bilirubin: 0.7 mg/dL (ref 0.3–1.2)
Total Protein: 7.7 g/dL (ref 6.5–8.1)

## 2017-04-30 LAB — CBC WITH DIFFERENTIAL/PLATELET
Basophils Absolute: 0 K/uL (ref 0–0.1)
Basophils Relative: 1 %
Eosinophils Absolute: 0 K/uL (ref 0–0.7)
Eosinophils Relative: 0 %
HCT: 33.8 % — ABNORMAL LOW (ref 35.0–47.0)
Hemoglobin: 11.4 g/dL — ABNORMAL LOW (ref 12.0–16.0)
Lymphocytes Relative: 26 %
Lymphs Abs: 1.1 K/uL (ref 1.0–3.6)
MCH: 30.2 pg (ref 26.0–34.0)
MCHC: 33.8 g/dL (ref 32.0–36.0)
MCV: 89.3 fL (ref 80.0–100.0)
Monocytes Absolute: 0.9 K/uL (ref 0.2–0.9)
Monocytes Relative: 22 %
Neutro Abs: 2.2 K/uL (ref 1.4–6.5)
Neutrophils Relative %: 51 %
Platelets: 84 K/uL — ABNORMAL LOW (ref 150–440)
RBC: 3.78 MIL/uL — ABNORMAL LOW (ref 3.80–5.20)
RDW: 15.8 % — ABNORMAL HIGH (ref 11.5–14.5)
WBC: 4.2 K/uL (ref 3.6–11.0)

## 2017-04-30 LAB — RETICULOCYTES
RBC.: 3.87 MIL/uL (ref 3.80–5.20)
Retic Count, Absolute: 100.6 K/uL (ref 19.0–183.0)
Retic Ct Pct: 2.6 % (ref 0.4–3.1)

## 2017-04-30 LAB — IRON AND TIBC
Iron: 102 ug/dL (ref 28–170)
Saturation Ratios: 25 % (ref 10.4–31.8)
TIBC: 416 ug/dL (ref 250–450)
UIBC: 314 ug/dL

## 2017-04-30 LAB — FERRITIN: Ferritin: 42 ng/mL (ref 11–307)

## 2017-04-30 NOTE — Progress Notes (Signed)
Patient offers no complaints today.  Patient's BP 155/79 HR 88.  Patient states this is a good BP for her.  She did take her medication today.

## 2017-04-30 NOTE — Progress Notes (Signed)
[facility] day:  04/30/2017  Chief Complaint: Julie Holland is a 76 y.o. female with leukopenia and thrombocytopenia who is seen for3 month assessment.  HPI:  The patient was last seen in the hematology clinic on 01/29/2017.  At that time, counts were stable.  She denied any fevers or sweats.  She had no issues with infections.  Exam revealed no adenopathy or hepatosplenomegaly.  She declined bone marrow.  CBC revealed a hematocrit of 36.1, hemoglobin 12.1, MCV 89.5, platelets 78,000, white count 3400 with an ANC of 1700.  Decision was made to continue close monitoring.  During the interim, she has been seen in the ER 3 times (scalp laceration s/p fall, staple removal, and a left leg skin tear).  Last available CBC on 03/08/2017 revealed a hematocrit of 31.5, hemoglobin 10.5, MCV 89.5, platelets 65,000, white count 4800 with an ANC of 2800.  Symptomatically, she is feeling "ok".  She denies any physical complaints. She denies recent illnesses/infections, melena, hematochezia, vaginal bleeding, and nosebleeds. Patient had some bruising following a mechanical fall. Patient reports that had significant bruising to her RIGHT hip. She states that she was told to take supplemental iron "because I bruised so bad".  She has been taking supplemental oral iron for about 2 weeks.    Past Medical History:  Diagnosis Date  . Arthritis   . Asthma   . 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  . Cataract   . Chronic kidney disease    stage 1  . Diabetes mellitus    type II  . GERD (gastroesophageal reflux disease)    OCC  . Hemorrhoids   . Hyperlipidemia   . Hypertension     Past Surgical History:  Procedure Laterality Date  . CATARACT EXTRACTION, BILATERAL    . CHOLECYSTECTOMY    . COLONOSCOPY  02/13/13   Dr Bary Castilla  . EPIGASTRIC HERNIA REPAIR N/A 03/20/2015   Procedure: HERNIA REPAIR  EPIGASTRIC ADULT;  Surgeon: Robert Bellow, MD;  Location: [facility] ORS;  Service: General;  Laterality: N/A;  . HEMORRHOID SURGERY N/A 03/20/2015    FOCAL HIGH-GRADE SQUAMOUS INTRAEPITHELIAL LESION (HSIL, ANAL /HEMORRHOIDECTOMY;   Robert Bellow, MD [facility] ORS;  : General;  Laterality: N/A;  . HERNIA REPAIR  July 2016   Epigastric hernia, primary repair  . TONSILLECTOMY  age 38  . TOTAL ABDOMINAL HYSTERECTOMY  01/1989  . TUBAL LIGATION    . TUMOR EXCISION N/A 07/18/2016   Procedure: EXCISION RECTAL MASS;  Surgeon: Robert Bellow, MD;  Location: [facility] ORS;  Service: General;  Laterality: N/A;    Family History  Problem Relation Age of Onset  . Bladder Cancer Mother   . Colon cancer Father   . Lung cancer Brother   . Lymphoma Sister     Social History:  reports that she has never smoked. She has never used smokeless tobacco. She reports that she does not drink alcohol or use drugs.  She lives in [place] with her husband.  She denies any exposure to radiations or toxins.  The patient is accompanied by her husband, [name], today.  Allergies:  Allergies  Allergen Reactions  . Hydrocodone Nausea And Vomiting and Other (See Comments)    Vertigo but patient takes hydrocodone/acetaminophen outpatient    Current Medications: Current Outpatient Prescriptions  Medication Sig Dispense Refill  . ACCU-CHEK SMARTVIEW test strip USE AS DIRECTED 90 each  0  . acetaminophen (TYLENOL) 500 MG tablet Take 500-1,000 mg by mouth every 6 (six) hours as needed (for pain).    Marland Kitchen aspirin 81 MG chewable tablet Chew 81 mg by mouth daily.    . Carboxymeth-Glycerin-Polysorb (REFRESH OPTIVE ADVANCED OP) Place 1 drop into both eyes 3 (three) times daily as needed (for dry/irritated eyes).    . carvedilol (COREG) 6.25 MG tablet Take 1 tablet (6.25 mg total) by mouth 2 (two) times daily with a meal. 180 tablet 0  . Ferrous Gluconate (IRON 27 PO) Take 27 mg by mouth.    . Fluticasone-Salmeterol (ADVAIR DISKUS)  100-50 MCG/DOSE AEPB Inhale 1 puff into the lungs 2 (two) times daily. 180 each 0  . lisinopril (PRINIVIL,ZESTRIL) 5 MG tablet Take 1 tablet (5 mg total) by mouth daily. 90 tablet 0  . metFORMIN (GLUCOPHAGE) 850 MG tablet TAKE 1 TABLET BY MOUTH TWO  TIMES DAILY WITH MEALS 180 tablet 0  . Multiple Vitamin (MULTIVITAMIN) tablet Take 2 tablets by mouth daily.     . Nutritional Supplements (ESTROVEN PO) Take 1 tablet by mouth daily.    . simvastatin (ZOCOR) 20 MG tablet TAKE 1 TABLET BY MOUTH AT  BEDTIME 90 tablet 0  . albuterol (PROVENTIL HFA;VENTOLIN HFA) 108 (90 BASE) MCG/ACT inhaler Inhale 2 puffs into the lungs every 6 (six) hours as needed for wheezing. (Patient not taking: Reported on 04/30/2017) 6.7 g 11   No current facility-administered medications for this visit.     Review of Systems:  GENERAL:  Feels "ok".  No fevers or sweats.  Weight down 1 pound since last visit. PERFORMANCE STATUS (ECOG):  0 HEENT:  No visual changes, runny nose, sore throat, mouth sores or tenderness. Lungs: No shortness of breath or cough.  No hemoptysis. Cardiac:  No chest pain, palpitations, orthopnea, or PND. GI:  Occasional diarrhea secondary to cholecystectomy.  No nausea, vomiting, constipation, melena or hematochezia. GU:  No urgency, frequency, dysuria, or hematuria. Musculoskeletal:  No back pain.  Arthritis in hands.  No muscle tenderness. Extremities:  No pain or swelling. Skin:  No rashes or skin changes. Bruising to LEFT medial tib/fib s/p fall.  Neuro:  No headache, numbness or weakness, balance or coordination issues. Endocrine: Diabetes.  No thyroid issues, hot flashes or night sweats. Psych:  No anxiety or depression. Pain:  No focal pain. Review of systems:  All other systems reviewed and found to be negative.  Physical Exam: Blood pressure (!) 155/79, pulse 88, temperature 98.4 F (36.9 C), temperature source Tympanic, resp. rate 18, weight 119 lb 7 oz (54.2 kg). GENERAL:  Thin woman  sitting comfortably in the exam room in no acute distress. MENTAL STATUS:  Alert and oriented to person, place and time. HEAD:  Short gray hair.  Normocephalic, atraumatic, face symmetric, no Cushingoid features. EYES:  Pupils equal round and reactive to light and accomodation.  No conjunctivitis or scleral icterus. ENT:  Oropharynx clear without lesion.  Tongue normal. Mucous membranes moist.  RESPIRATORY:  Clear to auscultation without rales, wheezes or rhonchi. CARDIOVASCULAR:  Regular rate and rhythm without murmur, rub or gallop. ABDOMEN:  Soft, non-tender, with active bowel sounds, and no hepatosplenomegaly.  No masses. SKIN:  No rashes, ulcers or lesions. EXTREMITIES:  Left ankle edema.  No skin discoloration or tenderness.  No palpable cords. Bruising to LEFT medial tib/fib s/p fall.  LYMPH NODES: No palpable cervical, supraclavicular, axillary or inguinal adenopathy  NEUROLOGICAL: Unremarkable. PSYCH:  Appropriate.   Appointment on 04/30/2017  Component Date Value Ref Range Status  . Sodium 04/30/2017 134* 135 - 145 mmol/L Final  . Potassium 04/30/2017 3.7  3.5 - 5.1 mmol/L Final  . Chloride 04/30/2017 97* 101 - 111 mmol/L Final  . CO2 04/30/2017 28  22 - 32 mmol/L Final  . Glucose, Bld 04/30/2017 160* 65 - 99 mg/dL Final  . BUN 04/30/2017 19  6 - 20 mg/dL Final  . Creatinine, Ser 04/30/2017 0.52  0.44 - 1.00 mg/dL Final  . Calcium 04/30/2017 9.3  8.9 - 10.3 mg/dL Final  . Total Protein 04/30/2017 7.7  6.5 - 8.1 g/dL Final  . Albumin 04/30/2017 4.3  3.5 - 5.0 g/dL Final  . AST 04/30/2017 34  15 - 41 U/L Final  . ALT 04/30/2017 27  14 - 54 U/L Final  . Alkaline Phosphatase 04/30/2017 71  38 - 126 U/L Final  . Total Bilirubin 04/30/2017 0.7  0.3 - 1.2 mg/dL Final  . GFR calc non Af Amer 04/30/2017 >60  >60 mL/min Final  . GFR calc Af Amer 04/30/2017 >60  >60 mL/min Final   Comment: (NOTE) The eGFR has been calculated using the CKD EPI equation. This calculation has not been  validated in all clinical situations. eGFR's persistently <60 mL/min signify possible Chronic Kidney Disease.   . Anion gap 04/30/2017 9  5 - 15 Final  . WBC 04/30/2017 4.2  3.6 - 11.0 K/uL Final  . RBC 04/30/2017 3.78* 3.80 - 5.20 MIL/uL Final  . Hemoglobin 04/30/2017 11.4* 12.0 - 16.0 g/dL Final  . HCT 04/30/2017 33.8* 35.0 - 47.0 % Final  . MCV 04/30/2017 89.3  80.0 - 100.0 fL Final  . MCH 04/30/2017 30.2  26.0 - 34.0 pg Final  . MCHC 04/30/2017 33.8  32.0 - 36.0 g/dL Final  . RDW 04/30/2017 15.8* 11.5 - 14.5 % Final  . Platelets 04/30/2017 84* 150 - 440 K/uL Final  . Neutrophils Relative % 04/30/2017 51  % Final  . Neutro Abs 04/30/2017 2.2  1.4 - 6.5 K/uL Final  . Lymphocytes Relative 04/30/2017 26  % Final  . Lymphs Abs 04/30/2017 1.1  1.0 - 3.6 K/uL Final  . Monocytes Relative 04/30/2017 22  % Final  . Monocytes Absolute 04/30/2017 0.9  0.2 - 0.9 K/uL Final  . Eosinophils Relative 04/30/2017 0  % Final  . Eosinophils Absolute 04/30/2017 0.0  0 - 0.7 K/uL Final  . Basophils Relative 04/30/2017 1  % Final  . Basophils Absolute 04/30/2017 0.0  0 - 0.1 K/uL Final  . Retic Ct Pct 04/30/2017 2.6  0.4 - 3.1 % Final  . RBC. 04/30/2017 3.87  3.80 - 5.20 MIL/uL Final  . Retic Count, Absolute 04/30/2017 100.6  19.0 - 183.0 K/uL Final    Assessment:  Julie Holland is a 76 y.o. female with leukopenia and thrombocytopenia of unclear etiology.  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  She denies any new medications or herbal products.  She denies any quinine water.  Her diet appears good.   Labs dating back to 05/06/2011 revealed intermittent leukopenia with a WBC ranging from 2600- 6600.  ANC has been > 1000.  Platelet count was intermittently low before 08/01/2013.  Since 03/13/2015, she has had thrombocytopenia < 100,000.  Hematocrit has been normal, except with her episode of rectal bleeding in 07/2016.   Work-up on 10/30/2016 revealed a hematocrit of 36.2, hemoglobin  12.1, MCV 88.1, platelets 70,000, white count 3100 with an ANC of 1800.  Differential was  unremarkable.  Normal studies included:  B12, folate, TSH, ANA, hepatitis B core antibody, hepatitis C antibody, HIV antibody, copper, and PTT.  Peripheral smear revealed giant platelets. Red blood cell and white cell morphology were unremarkable.  She has a history of rectal bleeding requiring 2 units of PRBCs on 07/31/2016.  She underwent oversewing of a bleeding arterial vessel at the site of previous mucosal resection.   She has taken oral iron since her surgery.  Symptomatically, she feels "ok". She denies any fevers or sweats.  She has no issues with infections.  Exam reveals no adenopathy or hepatosplenomegaly. WBC is 4,200 with an [place] of 2200. Hemoglobin 11.4, hematocrit 33.8, platelets 84,000.  Plan: 1.  Labs today:  CBC with diff, CMP, ferritin, iron studies, retic. 2   Discuss counts.  Etiology remains unclear.  WBC is now normal.  Anemia new since 03/2017.  Platelet count remains low, but improved.  Continue to monitor. 3.  Discussed low dose ASA therapy. Husband asking if dose can be reduced secondary to bleeding s/p fall. Education provided. Continue ASA 7m daily at this time.  3.  RTC in 3 months for MD assess and labs (CBC with diff, CMP, ferritin, SPEP).   BHonor Loh NP  04/30/2017, 2:17 PM   I saw and evaluated the patient, participating in the key portions of the service and reviewing pertinent diagnostic studies and records.  I reviewed the nurse practitioner's note and agree with the findings and the plan.  The assessment and plan were discussed with the patient.  A few questions were asked by the patient and answered.   MLequita Asal MD 05/02/2017,8:00 PM

## 2017-05-02 DIAGNOSIS — D649 Anemia, unspecified: Secondary | ICD-10-CM | POA: Insufficient documentation

## 2017-05-26 ENCOUNTER — Other Ambulatory Visit: Payer: Self-pay | Admitting: Family Medicine

## 2017-05-26 DIAGNOSIS — J4540 Moderate persistent asthma, uncomplicated: Secondary | ICD-10-CM

## 2017-05-27 ENCOUNTER — Other Ambulatory Visit: Payer: Self-pay | Admitting: Family Medicine

## 2017-05-27 DIAGNOSIS — I10 Essential (primary) hypertension: Secondary | ICD-10-CM

## 2017-05-27 DIAGNOSIS — E119 Type 2 diabetes mellitus without complications: Secondary | ICD-10-CM

## 2017-05-27 DIAGNOSIS — E785 Hyperlipidemia, unspecified: Secondary | ICD-10-CM

## 2017-05-29 ENCOUNTER — Other Ambulatory Visit: Payer: Self-pay

## 2017-05-29 DIAGNOSIS — E119 Type 2 diabetes mellitus without complications: Secondary | ICD-10-CM

## 2017-05-29 DIAGNOSIS — E785 Hyperlipidemia, unspecified: Secondary | ICD-10-CM

## 2017-05-29 MED ORDER — METFORMIN HCL 850 MG PO TABS: ORAL_TABLET | ORAL | 0 refills | Status: DC

## 2017-05-29 MED ORDER — SIMVASTATIN 20 MG PO TABS: 20.0000 mg | ORAL_TABLET | Freq: Every day | ORAL | 0 refills | Status: DC

## 2017-05-29 NOTE — Telephone Encounter (Signed)
Refill request was sent to Dr. Syed Asad A. Shah for approval and submission.  

## 2017-06-03 NOTE — Telephone Encounter (Signed)
error 

## 2017-06-03 NOTE — Telephone Encounter (Signed)
See order.

## 2017-06-09 ENCOUNTER — Ambulatory Visit: Payer: Medicare Other | Admitting: Family Medicine

## 2017-06-09 DIAGNOSIS — H40009 Preglaucoma, unspecified, unspecified eye: Secondary | ICD-10-CM | POA: Diagnosis not present

## 2017-06-09 DIAGNOSIS — E119 Type 2 diabetes mellitus without complications: Secondary | ICD-10-CM | POA: Diagnosis not present

## 2017-06-09 DIAGNOSIS — H43313 Vitreous membranes and strands, bilateral: Secondary | ICD-10-CM | POA: Diagnosis not present

## 2017-06-09 LAB — HM DIABETES EYE EXAM

## 2017-06-11 ENCOUNTER — Ambulatory Visit (INDEPENDENT_AMBULATORY_CARE_PROVIDER_SITE_OTHER): Payer: Medicare Other | Admitting: Family Medicine

## 2017-06-11 ENCOUNTER — Encounter: Payer: Self-pay | Admitting: Family Medicine

## 2017-06-11 VITALS — BP 144/82 | HR 91 | Ht 63.0 in | Wt 117.1 lb

## 2017-06-11 DIAGNOSIS — Z23 Encounter for immunization: Secondary | ICD-10-CM | POA: Diagnosis not present

## 2017-06-11 DIAGNOSIS — E119 Type 2 diabetes mellitus without complications: Secondary | ICD-10-CM | POA: Diagnosis not present

## 2017-06-11 DIAGNOSIS — E7800 Pure hypercholesterolemia, unspecified: Secondary | ICD-10-CM | POA: Diagnosis not present

## 2017-06-11 DIAGNOSIS — I10 Essential (primary) hypertension: Secondary | ICD-10-CM | POA: Diagnosis not present

## 2017-06-11 LAB — POCT GLYCOSYLATED HEMOGLOBIN (HGB A1C): Hemoglobin A1C: 7

## 2017-06-11 MED ORDER — LISINOPRIL 5 MG PO TABS: 5.0000 mg | ORAL_TABLET | Freq: Every day | ORAL | 0 refills | Status: DC

## 2017-06-11 NOTE — Progress Notes (Signed)
Name: Julie Holland   MRN: [id number]    DOB: Feb 14, 1941   Date:06/11/2017       Progress Note  Subjective  Chief Complaint  Chief Complaint  Patient presents with  . Diabetes  . Hypertension  . Hyperlipidemia  . Immunizations    flu vaccine given today     Diabetes  She presents for her follow-up diabetic visit. She has type 2 diabetes mellitus. Her disease course has been stable. Pertinent negatives for hypoglycemia include no headaches or pallor. Pertinent negatives for diabetes include no blurred vision, no chest pain, no fatigue, no foot paresthesias, no polydipsia and no polyuria. There are no hypoglycemic complications. Pertinent negatives for diabetic complications include no CVA, heart disease or peripheral neuropathy. Current diabetic treatment includes oral agent (monotherapy). Her weight is stable. She is following a generally healthy diet. She rarely participates in exercise. She monitors blood glucose at home 1-2 x per day. Her breakfast blood glucose range is generally 130-140 mg/dl. Her dinner blood glucose range is generally 130-140 mg/dl. Eye exam is current.  Hypertension  This is a chronic problem. The problem is unchanged. The problem is controlled. Pertinent negatives include no blurred vision, chest pain, headaches, palpitations or shortness of breath. Past treatments include ACE inhibitors and beta blockers. There is no history of kidney disease, CAD/MI or CVA.  Hyperlipidemia  This is a chronic problem. The problem is controlled. Recent lipid tests were reviewed and are normal. Pertinent negatives include no chest pain, leg pain, myalgias or shortness of breath. Current antihyperlipidemic treatment includes statins.     Past Medical History:  Diagnosis Date  . Arthritis   . Asthma   . 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  . Cataract   . Chronic kidney disease    stage 1  . Diabetes mellitus     type II  . GERD (gastroesophageal reflux disease)    OCC  . Hemorrhoids   . Hyperlipidemia   . Hypertension     Past Surgical History:  Procedure Laterality Date  . CATARACT EXTRACTION, BILATERAL    . CHOLECYSTECTOMY    . COLONOSCOPY  02/13/13   Dr Bary Castilla  . EPIGASTRIC HERNIA REPAIR N/A 03/20/2015   Procedure: HERNIA REPAIR EPIGASTRIC ADULT;  Surgeon: Robert Bellow, MD;  Location: [facility] ORS;  Service: General;  Laterality: N/A;  . HEMORRHOID SURGERY N/A 03/20/2015    FOCAL HIGH-GRADE SQUAMOUS INTRAEPITHELIAL LESION (HSIL, ANAL /HEMORRHOIDECTOMY;   Robert Bellow, MD [facility] ORS;  : General;  Laterality: N/A;  . HERNIA REPAIR  July 2016   Epigastric hernia, primary repair  . TONSILLECTOMY  age 70  . TOTAL ABDOMINAL HYSTERECTOMY  01/1989  . TUBAL LIGATION    . TUMOR EXCISION N/A 07/18/2016   Procedure: EXCISION RECTAL MASS;  Surgeon: Robert Bellow, MD;  Location: [facility] ORS;  Service: General;  Laterality: N/A;    Family History  Problem Relation Age of Onset  . Bladder Cancer Mother   . Colon cancer Father   . Lung cancer Brother   . Lymphoma Sister     Social History   Social History  . Marital status: Married    Spouse name: N/A  . Number of children: N/A  . Years of education: N/A   Occupational History  . Not on file.   Social History Main Topics  . Smoking status: Never Smoker  . Smokeless tobacco: Never Used  . Alcohol  use No  . Drug use: No  . Sexual activity: Yes    Birth control/ protection: Surgical   Other Topics Concern  . Not on file   Social History Narrative  . No narrative on file     Current Outpatient Prescriptions:  .  ACCU-CHEK SMARTVIEW test strip, USE AS DIRECTED, Disp: 90 each, Rfl: 0 .  acetaminophen (TYLENOL) 500 MG tablet, Take 500-1,000 mg by mouth every 6 (six) hours as needed (for pain)., Disp: , Rfl:  .  ADVAIR DISKUS 100-50 MCG/DOSE AEPB, USE 1 PUFF TWO TIMES DAILY, Disp: 3 each, Rfl: 2 .  albuterol (PROVENTIL  HFA;VENTOLIN HFA) 108 (90 BASE) MCG/ACT inhaler, Inhale 2 puffs into the lungs every 6 (six) hours as needed for wheezing., Disp: 6.7 g, Rfl: 11 .  aspirin 81 MG chewable tablet, Chew 81 mg by mouth daily., Disp: , Rfl:  .  Carboxymeth-Glycerin-Polysorb (REFRESH OPTIVE ADVANCED OP), Place 1 drop into both eyes 3 (three) times daily as needed (for dry/irritated eyes)., Disp: , Rfl:  .  carvedilol (COREG) 6.25 MG tablet, TAKE 1 TABLET BY MOUTH TWO  TIMES DAILY WITH A MEAL, Disp: 180 tablet, Rfl: 0 .  Ferrous Gluconate (IRON 27 PO), Take 27 mg by mouth., Disp: , Rfl:  .  lisinopril (PRINIVIL,ZESTRIL) 5 MG tablet, Take 1 tablet (5 mg total) by mouth daily., Disp: 90 tablet, Rfl: 0 .  metFORMIN (GLUCOPHAGE) 850 MG tablet, TAKE 1 TABLET BY MOUTH TWO  TIMES DAILY WITH MEALS, Disp: 180 tablet, Rfl: 0 .  Nutritional Supplements (ESTROVEN PO), Take 1 tablet by mouth daily., Disp: , Rfl:  .  simvastatin (ZOCOR) 20 MG tablet, Take 1 tablet (20 mg total) by mouth at bedtime., Disp: 90 tablet, Rfl: 0 .  Multiple Vitamin (MULTIVITAMIN) tablet, Take 2 tablets by mouth daily. , Disp: , Rfl:   Allergies  Allergen Reactions  . Hydrocodone Nausea And Vomiting and Other (See Comments)    Vertigo but patient takes hydrocodone/acetaminophen outpatient     Review of Systems  Constitutional: Negative for fatigue.  Eyes: Negative for blurred vision.  Respiratory: Negative for shortness of breath.   Cardiovascular: Negative for chest pain and palpitations.  Musculoskeletal: Negative for myalgias.  Skin: Negative for pallor.  Neurological: Negative for headaches.  Endo/Heme/Allergies: Negative for polydipsia.     Objective  Vitals:   06/11/17 1010  BP: (!) 144/82  Pulse: 91  SpO2: 96%  Weight: 117 lb 1.6 oz (53.1 kg)  Height: 5\' 3"  (1.6 m)    Physical Exam  Constitutional: She is oriented to person, place, and time and well-developed, well-nourished, and in no distress.  HENT:  Head: Normocephalic  and atraumatic.  Cardiovascular: Normal rate, regular rhythm and normal heart sounds.   No murmur heard. Pulmonary/Chest: Effort normal and breath sounds normal. She has no wheezes.  Abdominal: Soft. Bowel sounds are normal. There is no tenderness.  Musculoskeletal: She exhibits no edema.  Neurological: She is alert and oriented to person, place, and time.  Psychiatric: Mood, memory, affect and judgment normal.  Nursing note and vitals reviewed.      Assessment & Plan  1. Flu vaccine need  - Flu vaccine HIGH DOSE PF (Fluzone High dose)  2. Controlled type 2 diabetes mellitus without complication, without long-term current use of insulin (HCC)   A1c is 7.0%, well-controlled diabetes, no changes with her - POCT glycosylated hemoglobin (Hb A1C) - Urine Microalbumin w/creat. ratio  3. Essential hypertension Blood pressure is marginally elevated, no  change in treatment for now, reassess in 3 months - lisinopril (PRINIVIL,ZESTRIL) 5 MG tablet; Take 1 tablet (5 mg total) by mouth daily.  Dispense: 90 tablet; Refill: 0  4. Pure hypercholesterolemia FLP at goal from July 2018, continue on statin   Diane Hanel [facility] 06/11/2017 10:15 AM

## 2017-06-15 ENCOUNTER — Encounter: Payer: Self-pay | Admitting: Family Medicine

## 2017-06-24 DIAGNOSIS — E119 Type 2 diabetes mellitus without complications: Secondary | ICD-10-CM | POA: Diagnosis not present

## 2017-06-25 LAB — MICROALBUMIN / CREATININE URINE RATIO
Creatinine, Urine: 41 mg/dL (ref 20–275)
Microalb Creat Ratio: 183 mcg/mg creat — ABNORMAL HIGH (ref <30)
Microalb, Ur: 7.5 mg/dL

## 2017-07-03 DIAGNOSIS — H401111 Primary open-angle glaucoma, right eye, mild stage: Secondary | ICD-10-CM | POA: Diagnosis not present

## 2017-07-16 ENCOUNTER — Other Ambulatory Visit: Payer: Self-pay

## 2017-07-16 DIAGNOSIS — I10 Essential (primary) hypertension: Secondary | ICD-10-CM

## 2017-07-16 DIAGNOSIS — E119 Type 2 diabetes mellitus without complications: Secondary | ICD-10-CM

## 2017-07-16 DIAGNOSIS — E785 Hyperlipidemia, unspecified: Secondary | ICD-10-CM

## 2017-07-16 NOTE — Telephone Encounter (Signed)
Pharmacy tech at [facility] states the system start sending a RX refill on medicines 30 days in advance so the medicine refill can keep going so the pt wasn't run out and at times processing and delivery can take up to a week.

## 2017-07-16 NOTE — Telephone Encounter (Signed)
Dr. Manuella Ghazi just approved a 90 day supply of these medicines on 05/29/17; she should not be out yet Please resolve with pharmacy Thank you

## 2017-07-20 ENCOUNTER — Other Ambulatory Visit: Payer: Self-pay | Admitting: Family Medicine

## 2017-07-20 ENCOUNTER — Encounter: Payer: Self-pay | Admitting: General Surgery

## 2017-07-20 ENCOUNTER — Ambulatory Visit (INDEPENDENT_AMBULATORY_CARE_PROVIDER_SITE_OTHER): Payer: Medicare Other | Admitting: General Surgery

## 2017-07-20 VITALS — BP 148/78 | HR 68 | Resp 12 | Ht 62.0 in | Wt 122.0 lb

## 2017-07-20 DIAGNOSIS — E785 Hyperlipidemia, unspecified: Secondary | ICD-10-CM

## 2017-07-20 DIAGNOSIS — E119 Type 2 diabetes mellitus without complications: Secondary | ICD-10-CM

## 2017-07-20 DIAGNOSIS — I10 Essential (primary) hypertension: Secondary | ICD-10-CM

## 2017-07-20 DIAGNOSIS — D508 Other iron deficiency anemias: Secondary | ICD-10-CM

## 2017-07-20 NOTE — Patient Instructions (Signed)
Return to return for a  rigid sigmoidoscopy.

## 2017-07-20 NOTE — Progress Notes (Signed)
Patient ID: Julie Holland, female   DOB: Sep 03, 1941, 76 y.o.   MRN: [id number]  Chief Complaint  Patient presents with  . Follow-up    HPI Julie Holland is a 76 y.o. female here today for her follow up Anemia. Patient states she has been doing well.   [name], husband is present at  visit.                        Marland KitchenHPI  Past Medical History:  Diagnosis Date  . Arthritis   . Asthma   . 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  . Cataract   . Chronic kidney disease    stage 1  . Diabetes mellitus    type II  . GERD (gastroesophageal reflux disease)    OCC  . Hemorrhoids   . Hyperlipidemia   . Hypertension     Past Surgical History:  Procedure Laterality Date  . CATARACT EXTRACTION, BILATERAL    . CHOLECYSTECTOMY    . COLONOSCOPY  02/13/13   Dr Bary Castilla  . HERNIA REPAIR  July 2016   Epigastric hernia, primary repair  . TONSILLECTOMY  age 35  . TOTAL ABDOMINAL HYSTERECTOMY  01/1989  . TUBAL LIGATION      Family History  Problem Relation Age of Onset  . Bladder Cancer Mother   . Colon cancer Father   . Lung cancer Brother   . Lymphoma Sister     Social History Social History   Tobacco Use  . Smoking status: Never Smoker  . Smokeless tobacco: Never Used  Substance Use Topics  . Alcohol use: No  . Drug use: No    Allergies  Allergen Reactions  . Hydrocodone Nausea And Vomiting and Other (See Comments)    Vertigo but patient takes hydrocodone/acetaminophen outpatient    Current Outpatient Medications  Medication Sig Dispense Refill  . ACCU-CHEK SMARTVIEW test strip USE AS DIRECTED 90 each 0  . acetaminophen (TYLENOL) 500 MG tablet Take 500-1,000 mg by mouth every 6 (six) hours as needed (for pain).    . ADVAIR DISKUS 100-50 MCG/DOSE AEPB USE 1 PUFF TWO TIMES DAILY 3 each 2  . albuterol (PROVENTIL HFA;VENTOLIN HFA) 108 (90 BASE) MCG/ACT inhaler Inhale 2 puffs into the lungs every 6 (six) hours as  needed for wheezing. 6.7 g 11  . aspirin 81 MG chewable tablet Chew 81 mg by mouth daily.    . Carboxymeth-Glycerin-Polysorb (REFRESH OPTIVE ADVANCED OP) Place 1 drop into both eyes 3 (three) times daily as needed (for dry/irritated eyes).    . carvedilol (COREG) 6.25 MG tablet TAKE 1 TABLET BY MOUTH TWO  TIMES DAILY WITH A MEAL 180 tablet 0  . Ferrous Gluconate (IRON 27 PO) Take 27 mg by mouth.    Marland Kitchen lisinopril (PRINIVIL,ZESTRIL) 5 MG tablet Take 1 tablet (5 mg total) by mouth daily. 90 tablet 0  . metFORMIN (GLUCOPHAGE) 850 MG tablet TAKE 1 TABLET BY MOUTH TWO  TIMES DAILY WITH MEALS 180 tablet 0  . Multiple Vitamin (MULTIVITAMIN) tablet Take 2 tablets by mouth daily.     . Nutritional Supplements (ESTROVEN PO) Take 1 tablet by mouth daily.    . simvastatin (ZOCOR) 20 MG tablet Take 1 tablet (20 mg total) by mouth at bedtime. 90 tablet 0   No current facility-administered medications for this visit.     Review of Systems Review of Systems  Constitutional: Negative.  Respiratory: Negative.   Cardiovascular: Negative.     Blood pressure (!) 148/78, pulse 68, resp. rate 12, height 5\' 2"  (1.575 m), weight 122 lb (55.3 kg).  Physical Exam Physical Exam  Constitutional: She is oriented to person, place, and time. She appears well-developed and well-nourished.  Cardiovascular: Normal rate, regular rhythm and normal heart sounds.  Pulmonary/Chest: Effort normal and breath sounds normal.  Abdominal: Soft. Bowel sounds are normal. There is no tenderness.  Genitourinary:     Genitourinary Comments: Nodularity on rectal wall beginning approximately 5 cm above the dentate line.  Not able to be visualized on anoscopy.  Anoscopy unremarkable from the dentate line through the anoderm.  No evidence of new lesions.  Neurological: She is alert and oriented to person, place, and time.  Skin: Skin is warm and dry.      Assessment    Nodularity of the anterior rectal wall.  No evidence of  recurrent squamous cell carcinoma of the anus.    Plan    Patient will return for rigid sigmoidoscopy.  Fleets enema x1 prior to the procedure.      HPI, Physical Exam, Assessment and Plan have been scribed under the direction and in the presence of Hervey Ard, MD.  Gaspar Cola, CMA  I have completed the exam and reviewed the above documentation for accuracy and completeness.  I agree with the above.  Haematologist has been used and any errors in dictation or transcription are unintentional.  Hervey Ard, M.D., F.A.C.S.  Robert Bellow 07/20/2017, 4:58 PM

## 2017-07-20 NOTE — Telephone Encounter (Signed)
Copied from [place] [phone] [id number]. Topic: Inquiry >> Jul 20, 2017  1:20 PM Cecelia Byars, NT wrote: CRM for notification. See Telephone encounter for [facility] delivery pharmacy needs a verbal approval , for  1, zocorr, 20mg , 2. Coregg 6.25 mg 3. Metformin 850 mg  ref # [id number] and the call back number is [phone]    07/20/17.

## 2017-07-21 NOTE — Telephone Encounter (Signed)
Spoke with  Pharmacy tech for [facility] a verbal is not what needed but pt just asking for a refill for those three medications; route the request to Dr. Manuella Ghazi for review.

## 2017-07-27 ENCOUNTER — Inpatient Hospital Stay (HOSPITAL_BASED_OUTPATIENT_CLINIC_OR_DEPARTMENT_OTHER): Payer: Medicare Other | Admitting: Hematology and Oncology

## 2017-07-27 ENCOUNTER — Other Ambulatory Visit: Payer: Self-pay | Admitting: Hematology and Oncology

## 2017-07-27 ENCOUNTER — Inpatient Hospital Stay: Payer: Medicare Other | Attending: Hematology and Oncology

## 2017-07-27 ENCOUNTER — Telehealth: Payer: Self-pay | Admitting: *Deleted

## 2017-07-27 ENCOUNTER — Other Ambulatory Visit: Payer: Self-pay | Admitting: *Deleted

## 2017-07-27 VITALS — BP 148/80 | HR 91 | Temp 98.4°F | Resp 18 | Wt 118.1 lb

## 2017-07-27 DIAGNOSIS — D696 Thrombocytopenia, unspecified: Secondary | ICD-10-CM

## 2017-07-27 DIAGNOSIS — Z7984 Long term (current) use of oral hypoglycemic drugs: Secondary | ICD-10-CM | POA: Insufficient documentation

## 2017-07-27 DIAGNOSIS — D709 Neutropenia, unspecified: Secondary | ICD-10-CM

## 2017-07-27 DIAGNOSIS — N189 Chronic kidney disease, unspecified: Secondary | ICD-10-CM | POA: Diagnosis not present

## 2017-07-27 DIAGNOSIS — Z90710 Acquired absence of both cervix and uterus: Secondary | ICD-10-CM | POA: Diagnosis not present

## 2017-07-27 DIAGNOSIS — D72819 Decreased white blood cell count, unspecified: Secondary | ICD-10-CM | POA: Insufficient documentation

## 2017-07-27 DIAGNOSIS — Z7982 Long term (current) use of aspirin: Secondary | ICD-10-CM | POA: Diagnosis not present

## 2017-07-27 DIAGNOSIS — E785 Hyperlipidemia, unspecified: Secondary | ICD-10-CM | POA: Insufficient documentation

## 2017-07-27 DIAGNOSIS — Z79899 Other long term (current) drug therapy: Secondary | ICD-10-CM | POA: Diagnosis not present

## 2017-07-27 DIAGNOSIS — D649 Anemia, unspecified: Secondary | ICD-10-CM

## 2017-07-27 DIAGNOSIS — K219 Gastro-esophageal reflux disease without esophagitis: Secondary | ICD-10-CM | POA: Diagnosis not present

## 2017-07-27 DIAGNOSIS — E1122 Type 2 diabetes mellitus with diabetic chronic kidney disease: Secondary | ICD-10-CM | POA: Insufficient documentation

## 2017-07-27 DIAGNOSIS — J45909 Unspecified asthma, uncomplicated: Secondary | ICD-10-CM | POA: Diagnosis not present

## 2017-07-27 DIAGNOSIS — K625 Hemorrhage of anus and rectum: Secondary | ICD-10-CM | POA: Insufficient documentation

## 2017-07-27 DIAGNOSIS — Z9842 Cataract extraction status, left eye: Secondary | ICD-10-CM | POA: Diagnosis not present

## 2017-07-27 DIAGNOSIS — I129 Hypertensive chronic kidney disease with stage 1 through stage 4 chronic kidney disease, or unspecified chronic kidney disease: Secondary | ICD-10-CM | POA: Diagnosis not present

## 2017-07-27 LAB — CBC WITH DIFFERENTIAL/PLATELET
Basophils Absolute: 0 10*3/uL (ref 0–0.1)
Basophils Relative: 0 %
Eosinophils Absolute: 0 10*3/uL (ref 0–0.7)
Eosinophils Relative: 1 %
HCT: 36.5 % (ref 35.0–47.0)
Hemoglobin: 11.9 g/dL — ABNORMAL LOW (ref 12.0–16.0)
Lymphocytes Relative: 22 %
Lymphs Abs: 1.1 10*3/uL (ref 1.0–3.6)
MCH: 28.9 pg (ref 26.0–34.0)
MCHC: 32.6 g/dL (ref 32.0–36.0)
MCV: 88.7 fL (ref 80.0–100.0)
Monocytes Absolute: 1.2 10*3/uL — ABNORMAL HIGH (ref 0.2–0.9)
Monocytes Relative: 23 %
Neutro Abs: 2.8 10*3/uL (ref 1.4–6.5)
Neutrophils Relative %: 54 %
Platelets: 72 10*3/uL — ABNORMAL LOW (ref 150–440)
RBC: 4.11 MIL/uL (ref 3.80–5.20)
RDW: 15.8 % — ABNORMAL HIGH (ref 11.5–14.5)
WBC: 5.2 10*3/uL (ref 3.6–11.0)

## 2017-07-27 LAB — COMPREHENSIVE METABOLIC PANEL
ALT: 24 U/L (ref 14–54)
AST: 35 U/L (ref 15–41)
Albumin: 4 g/dL (ref 3.5–5.0)
Alkaline Phosphatase: 68 U/L (ref 38–126)
Anion gap: 10 (ref 5–15)
BUN: 21 mg/dL — ABNORMAL HIGH (ref 6–20)
CO2: 25 mmol/L (ref 22–32)
Calcium: 9.7 mg/dL (ref 8.9–10.3)
Chloride: 96 mmol/L — ABNORMAL LOW (ref 101–111)
Creatinine, Ser: 0.64 mg/dL (ref 0.44–1.00)
GFR calc Af Amer: >60 mL/min (ref >60)
GFR calc non Af Amer: >60 mL/min (ref >60)
Glucose, Bld: 173 mg/dL — ABNORMAL HIGH (ref 65–99)
Potassium: 3.9 mmol/L (ref 3.5–5.1)
Sodium: 131 mmol/L — ABNORMAL LOW (ref 135–145)
Total Bilirubin: 0.8 mg/dL (ref 0.3–1.2)
Total Protein: 7.8 g/dL (ref 6.5–8.1)

## 2017-07-27 LAB — RETICULOCYTES
RBC.: 4.12 MIL/uL (ref 3.80–5.20)
Retic Count, Absolute: 94.8 10*3/uL (ref 19.0–183.0)
Retic Ct Pct: 2.3 % (ref 0.4–3.1)

## 2017-07-27 LAB — FERRITIN: Ferritin: 51 ng/mL (ref 11–307)

## 2017-07-27 LAB — DAT, POLYSPECIFIC AHG (ARMC ONLY): Polyspecific AHG test: NEGATIVE

## 2017-07-27 NOTE — Progress Notes (Signed)
Patient offers no complaints today.  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

## 2017-07-27 NOTE — Progress Notes (Signed)
[facility] day:  07/27/2017  Chief Complaint: Julie Holland is a 76 y.o. female with leukopenia and thrombocytopenia who is seen for 3 month assessment.  HPI:  The patient was last seen in the hematology clinic on 04/30/2017.  At that time, she felt "ok". She denied any fevers or sweats.  She had no issues with infections.  Exam revealed no adenopathy or hepatosplenomegaly. WBC was 4,200 with an [place] of 2200. Hemoglobin was 11.4, hematocrit 33.8, and platelets 84,000.  WBC was now normal.  Anemia was new since 03/2017.  Platelet count remained low, but improved.  Plan was ongoing monitoring.  She was seen by Dr. Bary Castilla on 11/10/09/2016.  She was noted to have nodularity of the anterior rectal wall.  Rigid sigmoidoscopy is planned.  During the interim, patient has "not too bad". Patient has been experiencing some increased fatigue. She saw Dr. Bary Castilla on 07/20/2017. She states, "Dr Bary Castilla told me I was slowing healing because of my age". Patient denies any B symptoms or interval infections. She is eating well, with a 4 pound weight loss noted.     Past Medical History:  Diagnosis Date  . Arthritis   . Asthma   . 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  . Cataract   . Chronic kidney disease    stage 1  . Diabetes mellitus    type II  . GERD (gastroesophageal reflux disease)    OCC  . Hemorrhoids   . Hyperlipidemia   . Hypertension     Past Surgical History:  Procedure Laterality Date  . CATARACT EXTRACTION, BILATERAL    . CHOLECYSTECTOMY    . COLONOSCOPY  02/13/13   Dr Bary Castilla  . EXAM UNDER ANESTHESIA N/A 07/31/2016   Performed by Robert Bellow, [facility] at [facility] ORS  . EXAM UNDER ANESTHESIA SUTURE LIGATION RECTAL BLEEDING N/A 07/31/2016   Performed by Robert Bellow, MD at [facility] ORS  . EXCISION RECTAL MASS N/A 07/18/2016   Performed by Robert Bellow, MD at [facility]  ORS  . HEMORRHOIDECTOMY N/A 03/20/2015   Performed by Robert Bellow, MD at [facility] ORS  . HERNIA REPAIR  July 2016   Epigastric hernia, primary repair  . HERNIA REPAIR EPIGASTRIC ADULT N/A 03/20/2015   Performed by Robert Bellow, MD at [facility] ORS  . TONSILLECTOMY  age 71  . TOTAL ABDOMINAL HYSTERECTOMY  01/1989  . TUBAL LIGATION      Family History  Problem Relation Age of Onset  . Bladder Cancer Mother   . Colon cancer Father   . Lung cancer Brother   . Lymphoma Sister     Social History:  reports that  has never smoked. she has never used smokeless tobacco. She reports that she does not drink alcohol or use drugs.  She lives in [place] with her husband.  She denies any exposure to radiations or toxins.  The patient is accompanied by her husband, Julie Holland, today.  Allergies:  Allergies  Allergen Reactions  . Hydrocodone Nausea And Vomiting and Other (See Comments)    Vertigo but patient takes hydrocodone/acetaminophen outpatient    Current Medications: Current Outpatient Medications  Medication Sig Dispense Refill  . ACCU-CHEK SMARTVIEW test strip USE AS DIRECTED 90 each 0  . acetaminophen (TYLENOL) 500 MG tablet Take 500-1,000 mg by mouth every 6 (six) hours as needed (for pain).    . ADVAIR DISKUS  100-50 MCG/DOSE AEPB USE 1 PUFF TWO TIMES DAILY 3 each 2  . albuterol (PROVENTIL HFA;VENTOLIN HFA) 108 (90 BASE) MCG/ACT inhaler Inhale 2 puffs into the lungs every 6 (six) hours as needed for wheezing. 6.7 g 11  . aspirin 81 MG chewable tablet Chew 81 mg by mouth daily.    . Carboxymeth-Glycerin-Polysorb (REFRESH OPTIVE ADVANCED OP) Place 1 drop into both eyes 3 (three) times daily as needed (for dry/irritated eyes).    . carvedilol (COREG) 6.25 MG tablet TAKE 1 TABLET BY MOUTH TWO  TIMES DAILY WITH A MEAL 180 tablet 0  . Ferrous Gluconate (IRON 27 PO) Take 27 mg by mouth.    . latanoprost (XALATAN) 0.005 % ophthalmic solution Place 1 drop at bedtime into the right eye.    Marland Kitchen  lisinopril (PRINIVIL,ZESTRIL) 5 MG tablet Take 1 tablet (5 mg total) by mouth daily. 90 tablet 0  . metFORMIN (GLUCOPHAGE) 850 MG tablet TAKE 1 TABLET BY MOUTH TWO  TIMES DAILY WITH MEALS 180 tablet 0  . Multiple Vitamin (MULTIVITAMIN) tablet Take 2 tablets by mouth daily.     . Nutritional Supplements (ESTROVEN PO) Take 1 tablet by mouth daily.    . simvastatin (ZOCOR) 20 MG tablet Take 1 tablet (20 mg total) by mouth at bedtime. 90 tablet 0   No current facility-administered medications for this visit.     Review of Systems:  GENERAL:  Feels "not too bad".  Fatigue.  No fevers or sweats.  Weight down 4 pound since last visit. PERFORMANCE STATUS (ECOG):  0 HEENT:  No visual changes, runny nose, sore throat, mouth sores or tenderness. Lungs: No shortness of breath or cough.  No hemoptysis. Cardiac:  No chest pain, palpitations, orthopnea, or PND. GI:  Occasional diarrhea secondary to cholecystectomy.  No nausea, vomiting, constipation, melena or hematochezia. GU:  No urgency, frequency, dysuria, or hematuria. Musculoskeletal:  No back pain.  Arthritis in hands.  No muscle tenderness. Extremities:  No pain or swelling. Skin:  No rashes or skin changes. Bruising to LEFT medial tib/fib s/p fall.  Neuro:  No headache, numbness or weakness, balance or coordination issues. Endocrine: Diabetes.  No thyroid issues, hot flashes or night sweats. Psych:  No anxiety or depression. Pain:  No focal pain. Review of systems:  All other systems reviewed and found to be negative.  Physical Exam: Blood pressure (!) 148/80, pulse 91, temperature 98.4 F (36.9 C), temperature source Tympanic, resp. rate 18, weight 118 lb 2 oz (53.6 kg). GENERAL:  Thin woman sitting comfortably in the exam room in no acute distress. MENTAL STATUS:  Alert and oriented to person, place and time. HEAD:  Short gray hair.  Normocephalic, atraumatic, face symmetric, no Cushingoid features. EYES:  Pupils equal round and reactive  to light and accomodation.  No conjunctivitis or scleral icterus. ENT:  Oropharynx clear without lesion.  Tongue normal. Mucous membranes moist.  RESPIRATORY:  Clear to auscultation without rales, wheezes or rhonchi. CARDIOVASCULAR:  Regular rate and rhythm without murmur, rub or gallop. ABDOMEN:  Soft, non-tender, with active bowel sounds, and no hepatosplenomegaly.  No masses. SKIN:  No rashes, ulcers or lesions. EXTREMITIES:  Left ankle edema.  No skin discoloration or tenderness.  No palpable cords. LYMPH NODES: No palpable cervical, supraclavicular, axillary or inguinal adenopathy  NEUROLOGICAL: Unremarkable. PSYCH:  Appropriate.   Clinical Support on 07/27/2017  Component Date Value Ref Range Status  . Sodium 07/27/2017 131* 135 - 145 mmol/L Final  . Potassium 07/27/2017 3.9  3.5 - 5.1 mmol/L Final  . Chloride 07/27/2017 96* 101 - 111 mmol/L Final  . CO2 07/27/2017 25  22 - 32 mmol/L Final  . Glucose, Bld 07/27/2017 173* 65 - 99 mg/dL Final  . BUN 07/27/2017 21* 6 - 20 mg/dL Final  . Creatinine, Ser 07/27/2017 0.64  0.44 - 1.00 mg/dL Final  . Calcium 07/27/2017 9.7  8.9 - 10.3 mg/dL Final  . Total Protein 07/27/2017 7.8  6.5 - 8.1 g/dL Final  . Albumin 07/27/2017 4.0  3.5 - 5.0 g/dL Final  . AST 07/27/2017 35  15 - 41 U/L Final  . ALT 07/27/2017 24  14 - 54 U/L Final  . Alkaline Phosphatase 07/27/2017 68  38 - 126 U/L Final  . Total Bilirubin 07/27/2017 0.8  0.3 - 1.2 mg/dL Final  . GFR calc non Af Amer 07/27/2017 >60  >60 mL/min Final  . GFR calc Af Amer 07/27/2017 >60  >60 mL/min Final   Comment: (NOTE) The eGFR has been calculated using the CKD EPI equation. This calculation has not been validated in all clinical situations. eGFR's persistently <60 mL/min signify possible Chronic Kidney Disease.   . Anion gap 07/27/2017 10  5 - 15 Final  . WBC 07/27/2017 5.2  3.6 - 11.0 K/uL Final  . RBC 07/27/2017 4.11  3.80 - 5.20 MIL/uL Final  . Hemoglobin 07/27/2017 11.9* 12.0 -  16.0 g/dL Final  . HCT 07/27/2017 36.5  35.0 - 47.0 % Final  . MCV 07/27/2017 88.7  80.0 - 100.0 fL Final  . MCH 07/27/2017 28.9  26.0 - 34.0 pg Final  . MCHC 07/27/2017 32.6  32.0 - 36.0 g/dL Final  . RDW 07/27/2017 15.8* 11.5 - 14.5 % Final  . Platelets 07/27/2017 72* 150 - 440 K/uL Final   Comment: RESULT REPEATED AND VERIFIED PLATELET COUNT CONFIRMED BY SMEAR GIANT PLATELETS SEEN PLATELETS VARY IN SIZE   . Neutrophils Relative % 07/27/2017 54  % Final  . Neutro Abs 07/27/2017 2.8  1.4 - 6.5 K/uL Final  . Lymphocytes Relative 07/27/2017 22  % Final  . Lymphs Abs 07/27/2017 1.1  1.0 - 3.6 K/uL Final  . Monocytes Relative 07/27/2017 23  % Final  . Monocytes Absolute 07/27/2017 1.2* 0.2 - 0.9 K/uL Final  . Eosinophils Relative 07/27/2017 1  % Final  . Eosinophils Absolute 07/27/2017 0.0  0 - 0.7 K/uL Final  . Basophils Relative 07/27/2017 0  % Final  . Basophils Absolute 07/27/2017 0.0  0 - 0.1 K/uL Final    Assessment:  Julie Holland is a 76 y.o. female with leukopenia and thrombocytopenia of unclear etiology.  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  She denies any new medications or herbal products.  She denies any quinine water.  Her diet appears good.   Labs dating back to 05/06/2011 revealed intermittent leukopenia with a WBC ranging from 2600- 6600.  ANC has been > 1000.  Platelet count was intermittently low before 08/01/2013.  Since 03/13/2015, she has had thrombocytopenia < 100,000.  Hematocrit has been normal, except with her episode of rectal bleeding in 07/2016.   Work-up on 10/30/2016 revealed a hematocrit of 36.2, hemoglobin 12.1, MCV 88.1, platelets 70,000, white count 3100 with an ANC of 1800.  Differential was unremarkable.  Normal studies included:  B12, folate, TSH, ANA, hepatitis B core antibody, hepatitis C antibody, HIV antibody, copper, and PTT.  Peripheral smear revealed giant platelets. Red blood cell and white cell morphology were  unremarkable.  She  has a history of rectal bleeding requiring 2 units of PRBCs on 07/31/2016.  She underwent oversewing of a bleeding arterial vessel at the site of previous mucosal resection.   She has taken oral iron since her surgery.  Symptomatically, she feels "not too bad". She denies any fevers, sweats, or issues with infections.  Exam reveals no adenopathy or hepatosplenomegaly. WBC is 5,200 with an [place] of 2800. Hemoglobin 11.9, hematocrit 36.5,  platelets 72,000.  Plan: 1.  Labs today:  CBC with diff, CMP, ferritin, SPEP, FLCA, DAT, retic. 2   Discuss counts.  Etiology remains unclear.  WBC is now normal.  Anemia new since 03/2017.  Platelet count remains low, but improved. Discuss ultrasound to assess for splenic enlargement. Patient agrees to imaging at this time. Will place order for limited abdominal ultrasound.  3.  RTC in 3 months for MD assessment and labs (CBC with diff, CMP, ferritin).   Honor Loh, NP  07/27/2017, 3:13 PM   I saw and evaluated the patient, participating in the key portions of the service and reviewing pertinent diagnostic studies and records.  I reviewed the nurse practitioner's note and agree with the findings and the plan.  The assessment and plan were discussed with the patient.  A few questions were asked by the patient and answered.   Lequita Asal, MD  07/27/2017,3:13 PM

## 2017-07-27 NOTE — Telephone Encounter (Signed)
Called patient to make her aware of her US ABDOMEN LIMITED test that was  Ordered per 07/27/17 los. Patient stated that she already had an appt elsewhere on that  Date and if it can be Rsched. Date was Rsched per patient request for 08/04/17 @ 10:00. New schedule will mailed out.

## 2017-07-28 LAB — PROTEIN ELECTROPHORESIS, SERUM
A/G Ratio: 1 (ref 0.7–1.7)
Albumin ELP: 3.7 g/dL (ref 2.9–4.4)
Alpha-1-Globulin: 0.2 g/dL (ref 0.0–0.4)
Alpha-2-Globulin: 0.6 g/dL (ref 0.4–1.0)
Beta Globulin: 1.1 g/dL (ref 0.7–1.3)
Gamma Globulin: 1.6 g/dL (ref 0.4–1.8)
Globulin, Total: 3.6 g/dL (ref 2.2–3.9)
Total Protein ELP: 7.3 g/dL (ref 6.0–8.5)

## 2017-07-28 LAB — KAPPA/LAMBDA LIGHT CHAINS
Kappa free light chain: 23.4 mg/L — ABNORMAL HIGH (ref 3.3–19.4)
Kappa, lambda light chain ratio: 0.78 (ref 0.26–1.65)
Lambda free light chains: 30 mg/L — ABNORMAL HIGH (ref 5.7–26.3)

## 2017-07-30 ENCOUNTER — Other Ambulatory Visit: Payer: Self-pay | Admitting: Family Medicine

## 2017-07-30 ENCOUNTER — Encounter: Payer: Self-pay | Admitting: Hematology and Oncology

## 2017-07-30 DIAGNOSIS — I10 Essential (primary) hypertension: Secondary | ICD-10-CM

## 2017-08-03 ENCOUNTER — Ambulatory Visit: Payer: Medicare Other

## 2017-08-03 DIAGNOSIS — H04123 Dry eye syndrome of bilateral lacrimal glands: Secondary | ICD-10-CM | POA: Diagnosis not present

## 2017-08-03 DIAGNOSIS — H401131 Primary open-angle glaucoma, bilateral, mild stage: Secondary | ICD-10-CM | POA: Diagnosis not present

## 2017-08-04 ENCOUNTER — Ambulatory Visit: Payer: Medicare Other

## 2017-08-05 ENCOUNTER — Ambulatory Visit: Payer: Medicare Other | Admitting: General Surgery

## 2017-08-24 ENCOUNTER — Other Ambulatory Visit: Payer: Self-pay | Admitting: Family Medicine

## 2017-08-24 DIAGNOSIS — E119 Type 2 diabetes mellitus without complications: Secondary | ICD-10-CM

## 2017-08-24 NOTE — Telephone Encounter (Signed)
Copied from [place] [id number]. Topic: Quick Communication - Rx Refill/Question >> Aug 24, 2017 10:50 AM Carolyn Stare wrote:  Julie Holland  ACCU-CHEK SMARTVIEW test strip

## 2017-08-27 MED ORDER — ACCU-CHEK SMARTVIEW VI STRP: ORAL_STRIP | 0 refills | Status: DC

## 2017-08-29 ENCOUNTER — Other Ambulatory Visit: Payer: Self-pay | Admitting: Family Medicine

## 2017-08-29 DIAGNOSIS — E119 Type 2 diabetes mellitus without complications: Secondary | ICD-10-CM

## 2017-08-29 DIAGNOSIS — E785 Hyperlipidemia, unspecified: Secondary | ICD-10-CM

## 2017-08-29 DIAGNOSIS — I10 Essential (primary) hypertension: Secondary | ICD-10-CM

## 2017-09-11 ENCOUNTER — Ambulatory Visit (INDEPENDENT_AMBULATORY_CARE_PROVIDER_SITE_OTHER): Payer: Medicare Other | Admitting: Family Medicine

## 2017-09-11 ENCOUNTER — Encounter: Payer: Self-pay | Admitting: Family Medicine

## 2017-09-11 VITALS — BP 134/88 | HR 82 | Temp 98.1°F | Resp 14 | Ht 62.0 in | Wt 116.2 lb

## 2017-09-11 DIAGNOSIS — E0821 Diabetes mellitus due to underlying condition with diabetic nephropathy: Secondary | ICD-10-CM | POA: Diagnosis not present

## 2017-09-11 DIAGNOSIS — J4540 Moderate persistent asthma, uncomplicated: Secondary | ICD-10-CM

## 2017-09-11 DIAGNOSIS — E7800 Pure hypercholesterolemia, unspecified: Secondary | ICD-10-CM

## 2017-09-11 DIAGNOSIS — I10 Essential (primary) hypertension: Secondary | ICD-10-CM

## 2017-09-11 LAB — POCT GLYCOSYLATED HEMOGLOBIN (HGB A1C): Hemoglobin A1C: 6.7

## 2017-09-11 MED ORDER — LISINOPRIL 5 MG PO TABS: 5.0000 mg | ORAL_TABLET | Freq: Every day | ORAL | 0 refills | Status: DC

## 2017-09-11 MED ORDER — ALBUTEROL SULFATE HFA 108 (90 BASE) MCG/ACT IN AERS: 2.0000 | INHALATION_SPRAY | Freq: Four times a day (QID) | RESPIRATORY_TRACT | 2 refills | Status: DC | PRN

## 2017-09-11 NOTE — Progress Notes (Signed)
Name: Julie Holland   MRN: [id number]    DOB: 01-Apr-1941   Date:09/11/2017       Progress Note  Subjective  Chief Complaint  Chief Complaint  Patient presents with  . Hyperlipidemia    Pt states she eating healthy   . Hypertension    Pt states her Bp is doing good but taken care of her husband he is full diabetic and he is not taking good care of his self. Pt denies any issue  . Medication Refill    albuterol proair inahler  . Diabetes    Pt check her sugars daily. Sugars run 130 in morning and 150 or lower at night    Hyperlipidemia  This is a chronic problem. The problem is controlled. Recent lipid tests were reviewed and are normal. Pertinent negatives include no chest pain, leg pain, myalgias or shortness of breath. Current antihyperlipidemic treatment includes statins. Risk factors for coronary artery disease include diabetes mellitus and dyslipidemia.  Hypertension  This is a chronic problem. The problem is unchanged. The problem is controlled. Pertinent negatives include no blurred vision, chest pain, headaches, palpitations or shortness of breath. Past treatments include ACE inhibitors and beta blockers. There is no history of kidney disease, CAD/MI or CVA.  Diabetes  She presents for her follow-up diabetic visit. She has type 2 diabetes mellitus. Her disease course has been stable. Pertinent negatives for hypoglycemia include no headaches. Pertinent negatives for diabetes include no blurred vision, no chest pain, no fatigue, no foot paresthesias, no polydipsia and no polyuria. Pertinent negatives for diabetic complications include no CVA. Current diabetic treatment includes oral agent (monotherapy). Her weight is stable. She is following a generally healthy diet. She monitors blood glucose at home 3-4 x per week. Her breakfast blood glucose range is generally 110-130 mg/dl. Her bedtime blood glucose range is generally 140-180 mg/dl. An ACE inhibitor/angiotensin II receptor blocker is  being taken. Eye exam is current.   He would also like to have a prescription for albuterol inhaler to be used for asthma exacerbations, has not had to use it, asthma is well controlled on Advair.  Past Medical History:  Diagnosis Date  . Arthritis   . Asthma   . 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  . Cataract   . Chronic kidney disease    stage 1  . Diabetes mellitus    type II  . GERD (gastroesophageal reflux disease)    OCC  . Hemorrhoids   . Hyperlipidemia   . Hypertension     Past Surgical History:  Procedure Laterality Date  . CATARACT EXTRACTION, BILATERAL    . CHOLECYSTECTOMY    . COLONOSCOPY  02/13/13   Dr Bary Castilla  . EPIGASTRIC HERNIA REPAIR N/A 03/20/2015   Procedure: HERNIA REPAIR EPIGASTRIC ADULT;  Surgeon: Robert Bellow, MD;  Location: [facility] ORS;  Service: General;  Laterality: N/A;  . HEMORRHOID SURGERY N/A 03/20/2015    FOCAL HIGH-GRADE SQUAMOUS INTRAEPITHELIAL LESION (HSIL, ANAL /HEMORRHOIDECTOMY;   Robert Bellow, MD [facility] ORS;  : General;  Laterality: N/A;  . HERNIA REPAIR  July 2016   Epigastric hernia, primary repair  . TONSILLECTOMY  age 41  . TOTAL ABDOMINAL HYSTERECTOMY  01/1989  . TUBAL LIGATION    . 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  Case has been presented at the [name] tumor board.  No indication for additional treatment outside of serial exams  Surgeon: Robert Bellow, MD;  Location: [facility] ORS;  Service: General;  Laterality: N/A;    Family History  Problem Relation Age of Onset  . Bladder Cancer Mother   . Colon cancer Father   . Lung cancer Brother   . Lymphoma Sister     Social History   Socioeconomic History  . Marital status: Married    Spouse name: Not on file  . Number of children: Not on file  . Years of education: Not on file  . Highest education level: Not on file   Social Needs  . Financial resource strain: Not on file  . Food insecurity - worry: Not on file  . Food insecurity - inability: Not on file  . Transportation needs - medical: Not on file  . Transportation needs - non-medical: Not on file  Occupational History  . Not on file  Tobacco Use  . Smoking status: Never Smoker  . Smokeless tobacco: Never Used  Substance and Sexual Activity  . Alcohol use: No  . Drug use: No  . Sexual activity: Yes    Birth control/protection: Surgical  Other Topics Concern  . Not on file  Social History Narrative  . Not on file     Current Outpatient Medications:  .  ACCU-CHEK SMARTVIEW test strip, Use as instructed to check BG once daily., Disp: 90 each, Rfl: 0 .  acetaminophen (TYLENOL) 500 MG tablet, Take 500-1,000 mg by mouth every 6 (six) hours as needed (for pain)., Disp: , Rfl:  .  ADVAIR DISKUS 100-50 MCG/DOSE AEPB, USE 1 PUFF TWO TIMES DAILY, Disp: 3 each, Rfl: 2 .  albuterol (PROVENTIL HFA;VENTOLIN HFA) 108 (90 BASE) MCG/ACT inhaler, Inhale 2 puffs into the lungs every 6 (six) hours as needed for wheezing., Disp: 6.7 g, Rfl: 11 .  aspirin 81 MG chewable tablet, Chew 81 mg by mouth daily., Disp: , Rfl:  .  Carboxymeth-Glycerin-Polysorb (REFRESH OPTIVE ADVANCED OP), Place 1 drop into both eyes 3 (three) times daily as needed (for dry/irritated eyes)., Disp: , Rfl:  .  carvedilol (COREG) 6.25 MG tablet, TAKE 1 TABLET BY MOUTH TWO  TIMES DAILY WITH A MEAL, Disp: 180 tablet, Rfl: 0 .  Ferrous Gluconate (IRON 27 PO), Take 27 mg by mouth., Disp: , Rfl:  .  latanoprost (XALATAN) 0.005 % ophthalmic solution, Place 1 drop at bedtime into the right eye., Disp: , Rfl:  .  lisinopril (PRINIVIL,ZESTRIL) 5 MG tablet, Take 1 tablet (5 mg total) by mouth daily., Disp: 90 tablet, Rfl: 0 .  metFORMIN (GLUCOPHAGE) 850 MG tablet, TAKE 1 TABLET BY MOUTH TWO  TIMES DAILY WITH MEALS, Disp: 180 tablet, Rfl: 0 .  Multiple Vitamin (MULTIVITAMIN) tablet, Take 2 tablets by  mouth daily. , Disp: , Rfl:  .  Nutritional Supplements (ESTROVEN PO), Take 1 tablet by mouth daily., Disp: , Rfl:  .  simvastatin (ZOCOR) 20 MG tablet, TAKE 1 TABLET BY MOUTH AT  BEDTIME, Disp: 90 tablet, Rfl: 0  Allergies  Allergen Reactions  . Hydrocodone Nausea And Vomiting and Other (See Comments)    Vertigo but patient takes hydrocodone/acetaminophen outpatient     Review of Systems  Constitutional: Negative for fatigue.  Eyes: Negative for blurred vision.  Respiratory: Negative for shortness of breath.   Cardiovascular: Negative for chest pain and palpitations.  Musculoskeletal: Negative for myalgias.  Neurological: Negative for headaches.  Endo/Heme/Allergies: Negative for polydipsia.     Objective  Vitals:   09/11/17 1013  BP: 134/88  Pulse: 82  Resp: 14  Temp: 98.1 F (36.7 C)  TempSrc: Oral  SpO2: 98%  Weight: 116 lb 3.2 oz (52.7 kg)  Height: 5\' 2"  (1.575 m)    Physical Exam  Constitutional: She is oriented to person, place, and time and well-developed, well-nourished, and in no distress.  HENT:  Head: Normocephalic and atraumatic.  Cardiovascular: Normal rate, regular rhythm and normal heart sounds.  No murmur heard. Pulmonary/Chest: Effort normal and breath sounds normal. She has no wheezes.  Abdominal: Soft. Bowel sounds are normal. There is no tenderness.  Musculoskeletal: She exhibits no edema.  Neurological: She is alert and oriented to person, place, and time.  Psychiatric: Mood, memory, affect and judgment normal.  Nursing note and vitals reviewed.     Assessment & Plan  1. Diabetes mellitus due to underlying condition, controlled, with diabetic nephropathy, without long-term current use of insulin (HCC)  Point-of-care A1c 6.7%, well-controlled diabetes - POCT HgB A1C - Urine Microalbumin w/creat. ratio  2. Essential hypertension  blood pressure marginally elevated, likely because of stress and anxiety, continue on lisinopril -  lisinopril (PRINIVIL,ZESTRIL) 5 MG tablet; Take 1 tablet (5 mg total) by mouth daily.  Dispense: 90 tablet; Refill: 0  3. Moderate persistent asthma without complication  - albuterol (PROVENTIL HFA;VENTOLIN HFA) 108 (90 Base) MCG/ACT inhaler; Inhale 2 puffs into the lungs every 6 (six) hours as needed for wheezing.  Dispense: 6.7 g; Refill: 2  4. Pure hypercholesterolemia  - Lipid panel - COMPLETE METABOLIC PANEL WITH GFR   Caitlinn Klinker [facility] 09/11/2017 10:23 AM

## 2017-09-22 ENCOUNTER — Ambulatory Visit: Payer: Medicare Other | Admitting: General Surgery

## 2017-10-02 ENCOUNTER — Telehealth: Payer: Self-pay | Admitting: Family Medicine

## 2017-10-02 DIAGNOSIS — J4540 Moderate persistent asthma, uncomplicated: Secondary | ICD-10-CM

## 2017-10-02 MED ORDER — ALBUTEROL SULFATE HFA 108 (90 BASE) MCG/ACT IN AERS: 2.0000 | INHALATION_SPRAY | Freq: Four times a day (QID) | RESPIRATORY_TRACT | 2 refills | Status: DC | PRN

## 2017-10-02 NOTE — Telephone Encounter (Signed)
Julie Holland at [facility] requesting for [employer] to be sent in due to insurance covering this medication and not Ventolin. If Ventolin is still prescribed, medication will need a PA.  Reference # [id number]

## 2017-10-02 NOTE — Telephone Encounter (Signed)
Pro-air inhaler sent to patient's pharmacy

## 2017-10-02 NOTE — Telephone Encounter (Signed)
Copied from [place] [id number]. Topic: Quick Communication - See Telephone Encounter >> Oct 02, 2017  9:38 AM Boyd Kerbs wrote: CRM for notification. See Telephone encounter for:   Merry Proud at [facility] [phone] regarding prescription for Ventolin.  Insurance does not cover this.    Could use :  Proair HFA  or  AER 90 micrograms, this is covered under ins. ..  If wants the Ventolin will need a PA   Ref # [id number]   10/02/17.

## 2017-10-03 IMAGING — CR DG ANKLE COMPLETE 3+V*L*
1 series · 3 of 3 positions shown · non-contrast
Comparison: No recent prior.

CLINICAL DATA: Recent fall.

EXAM:
LEFT ANKLE COMPLETE - 3+ VIEW

[Series 1: dg ankle complete left · 0.14mm/px · 3 of 3 slices shown]
[im 1/3]
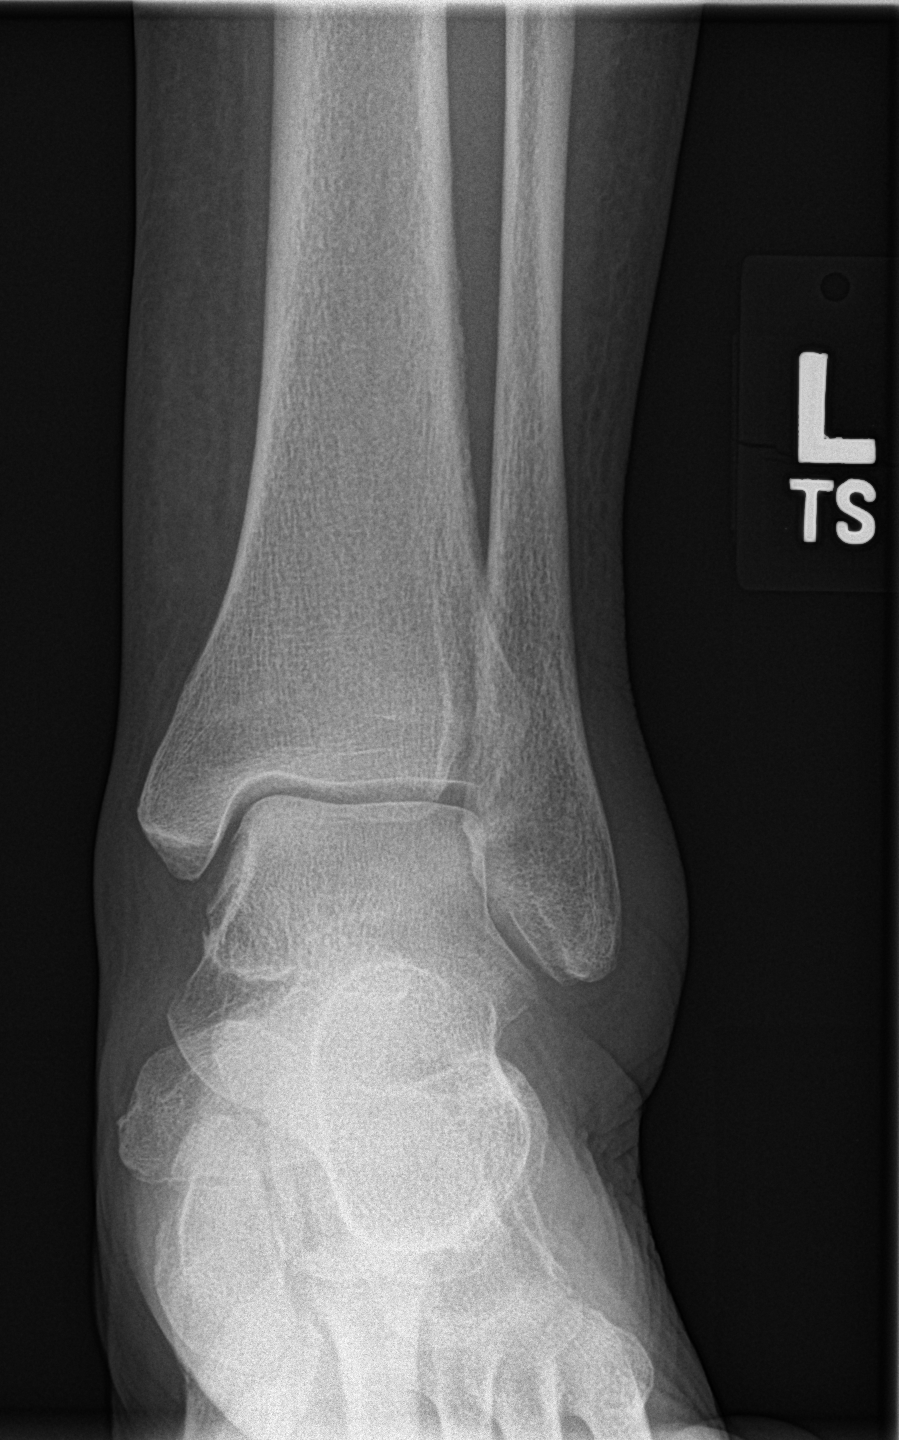
[im 2/3]
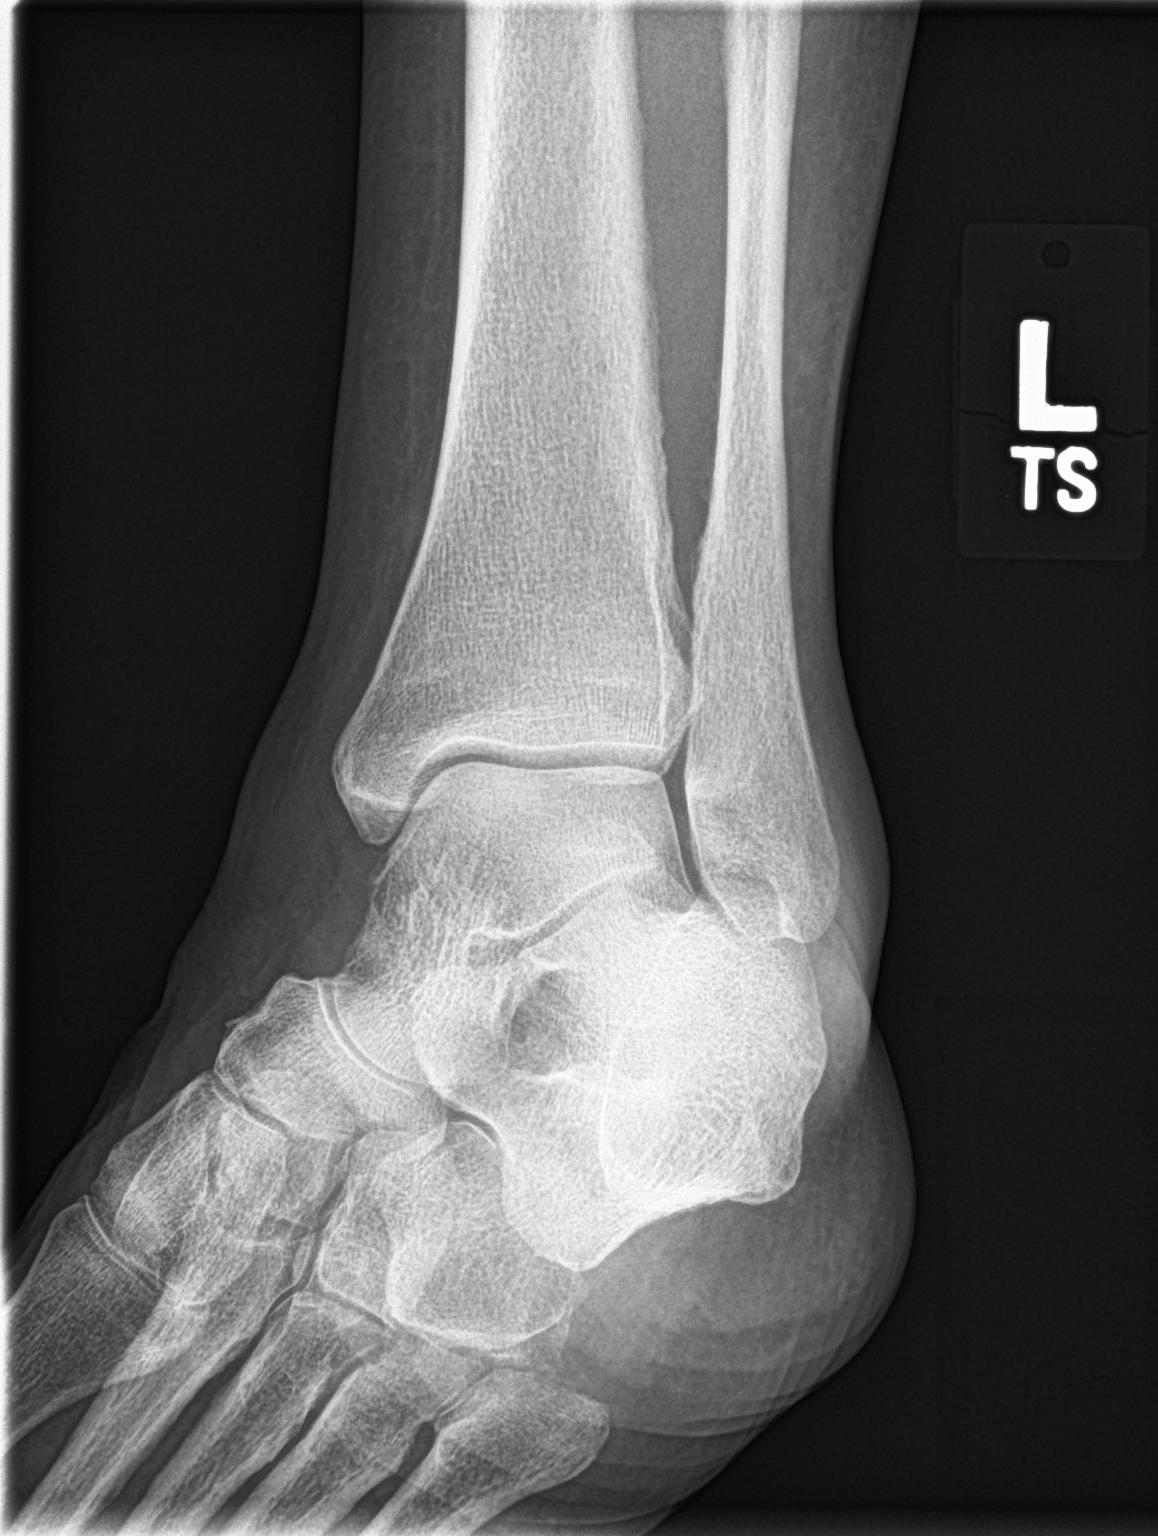
[im 3/3]
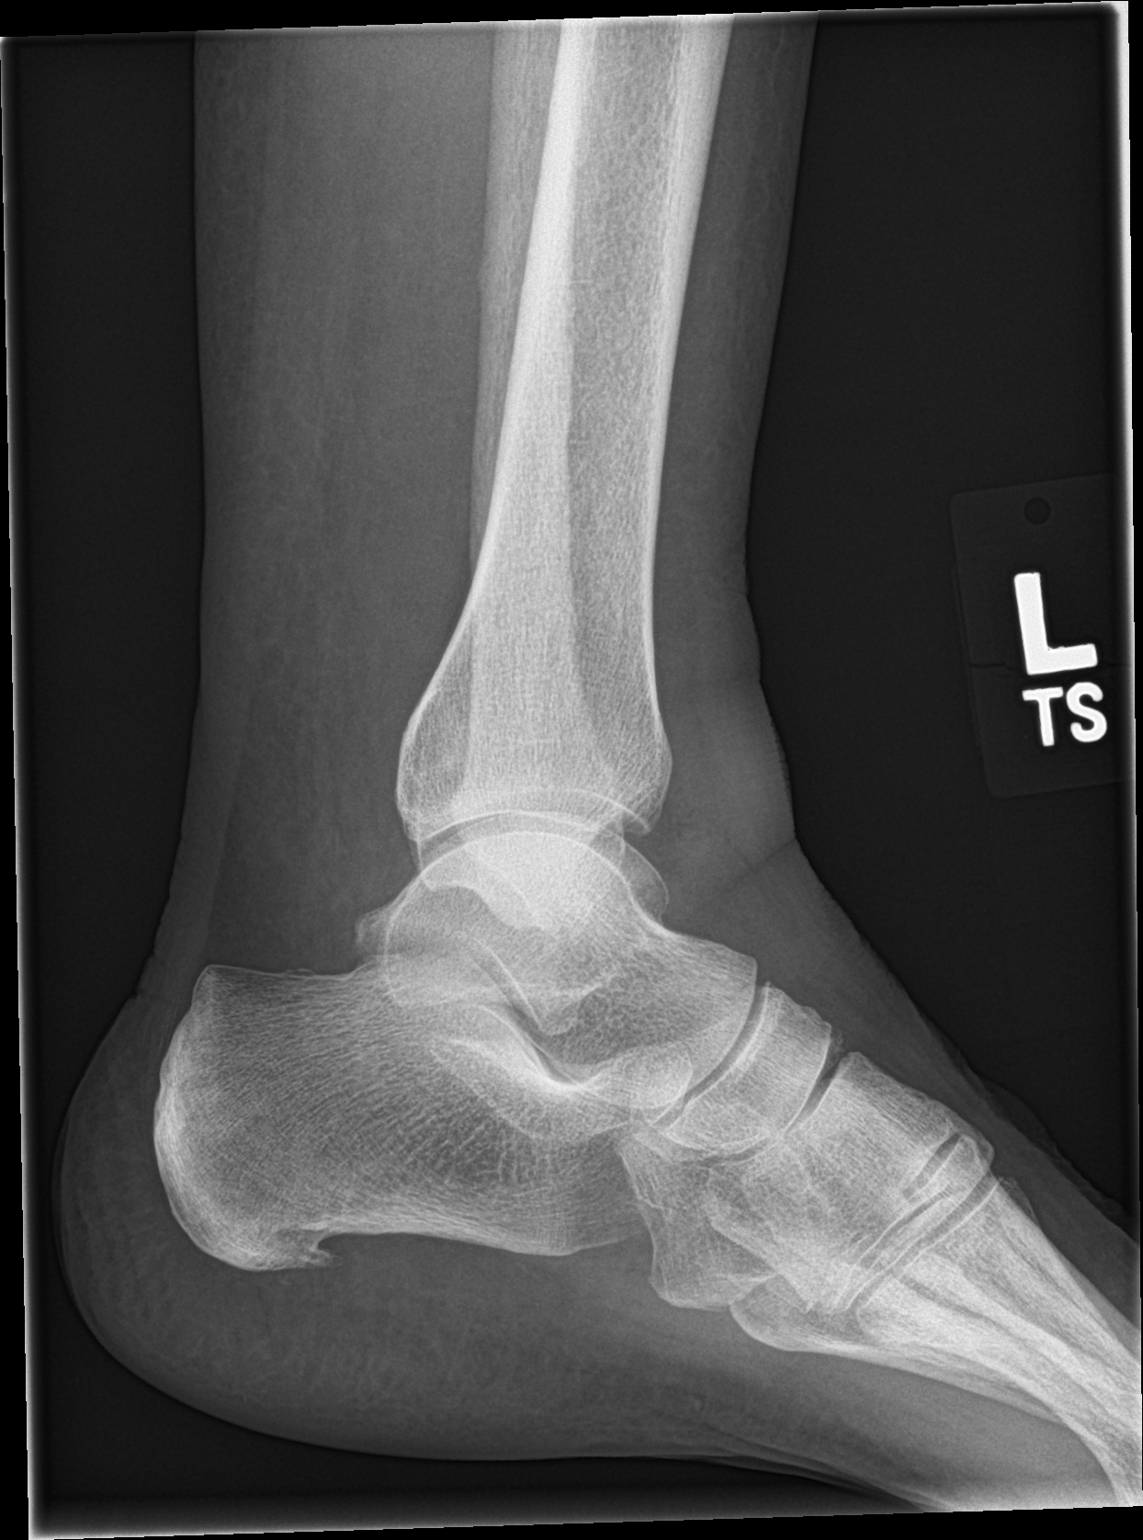

[3 of 3 positions shown; findings below may reference images not displayed]

FINDINGS: Soft tissue swelling noted, particularly over the lateral malleolus.
No evidence of fracture or dislocation. Mild calcaneal spurring.
IMPRESSION: Soft tissue swelling.  No evidence of fracture or dislocation.

## 2017-10-05 DIAGNOSIS — E7800 Pure hypercholesterolemia, unspecified: Secondary | ICD-10-CM | POA: Diagnosis not present

## 2017-10-05 DIAGNOSIS — E0821 Diabetes mellitus due to underlying condition with diabetic nephropathy: Secondary | ICD-10-CM | POA: Diagnosis not present

## 2017-10-06 LAB — COMPLETE METABOLIC PANEL WITH GFR
AG Ratio: 1.3 (calc) (ref 1.0–2.5)
ALT: 23 U/L (ref 6–29)
AST: 27 U/L (ref 10–35)
Albumin: 4.2 g/dL (ref 3.6–5.1)
Alkaline phosphatase (APISO): 69 U/L (ref 33–130)
BUN/Creatinine Ratio: 36 (calc) — ABNORMAL HIGH (ref 6–22)
BUN: 20 mg/dL (ref 7–25)
CO2: 32 mmol/L (ref 20–32)
Calcium: 9.6 mg/dL (ref 8.6–10.4)
Chloride: 95 mmol/L — ABNORMAL LOW (ref 98–110)
Creat: 0.55 mg/dL — ABNORMAL LOW (ref 0.60–0.93)
GFR, Est African American: 104 mL/min/{1.73_m2} (ref >=60)
GFR, Est Non African American: 90 mL/min/{1.73_m2} (ref >=60)
Globulin: 3.3 g/dL (calc) (ref 1.9–3.7)
Glucose, Bld: 145 mg/dL — ABNORMAL HIGH (ref 65–99)
Potassium: 3.6 mmol/L (ref 3.5–5.3)
Sodium: 133 mmol/L — ABNORMAL LOW (ref 135–146)
Total Bilirubin: 0.7 mg/dL (ref 0.2–1.2)
Total Protein: 7.5 g/dL (ref 6.1–8.1)

## 2017-10-06 LAB — LIPID PANEL
Cholesterol: 113 mg/dL (ref <200)
HDL: 50 mg/dL — ABNORMAL LOW (ref >50)
LDL Cholesterol (Calc): 49 mg/dL (calc)
Non-HDL Cholesterol (Calc): 63 mg/dL (calc) (ref <130)
Total CHOL/HDL Ratio: 2.3 (calc) (ref <5.0)
Triglycerides: 60 mg/dL (ref <150)

## 2017-10-08 ENCOUNTER — Other Ambulatory Visit: Payer: Self-pay

## 2017-10-08 DIAGNOSIS — E119 Type 2 diabetes mellitus without complications: Secondary | ICD-10-CM

## 2017-10-08 MED ORDER — METFORMIN HCL 850 MG PO TABS: 850.0000 mg | ORAL_TABLET | Freq: Two times a day (BID) | ORAL | 0 refills | Status: DC

## 2017-10-10 LAB — MICROALBUMIN / CREATININE URINE RATIO
Creatinine, Urine: 40 mg/dL (ref 20–275)
Microalb Creat Ratio: 340 mcg/mg creat — ABNORMAL HIGH (ref <30)
Microalb, Ur: 13.6 mg/dL

## 2017-10-14 ENCOUNTER — Other Ambulatory Visit: Payer: Self-pay | Admitting: Family Medicine

## 2017-10-14 DIAGNOSIS — E1121 Type 2 diabetes mellitus with diabetic nephropathy: Secondary | ICD-10-CM

## 2017-10-15 ENCOUNTER — Other Ambulatory Visit: Payer: Self-pay | Admitting: Family Medicine

## 2017-10-15 DIAGNOSIS — E119 Type 2 diabetes mellitus without complications: Secondary | ICD-10-CM

## 2017-10-19 ENCOUNTER — Telehealth: Payer: Self-pay | Admitting: Family Medicine

## 2017-10-19 NOTE — Telephone Encounter (Signed)
Copied from [place]. Topic: General - Other >> Oct 19, 2017  9:50 AM Lolita Rieger, RMA wrote: Reason for CRM: Vince from [facility] called and would like a call back concerning medication coreg [id number] reference [phone]

## 2017-10-22 ENCOUNTER — Other Ambulatory Visit: Payer: Self-pay

## 2017-10-22 DIAGNOSIS — I10 Essential (primary) hypertension: Secondary | ICD-10-CM

## 2017-10-22 NOTE — Telephone Encounter (Signed)
Rx refill has been sent to Dr. Manuella Ghazi for review

## 2017-10-27 ENCOUNTER — Inpatient Hospital Stay (HOSPITAL_BASED_OUTPATIENT_CLINIC_OR_DEPARTMENT_OTHER): Payer: Medicare Other | Admitting: Hematology and Oncology

## 2017-10-27 ENCOUNTER — Inpatient Hospital Stay: Payer: Medicare Other | Attending: Hematology and Oncology

## 2017-10-27 ENCOUNTER — Encounter: Payer: Self-pay | Admitting: Hematology and Oncology

## 2017-10-27 ENCOUNTER — Other Ambulatory Visit: Payer: Medicare Other

## 2017-10-27 ENCOUNTER — Ambulatory Visit: Payer: Medicare Other | Admitting: Hematology and Oncology

## 2017-10-27 ENCOUNTER — Telehealth: Payer: Self-pay | Admitting: *Deleted

## 2017-10-27 VITALS — BP 173/93 | HR 80 | Temp 96.8°F | Resp 18 | Wt 114.6 lb

## 2017-10-27 DIAGNOSIS — N189 Chronic kidney disease, unspecified: Secondary | ICD-10-CM | POA: Insufficient documentation

## 2017-10-27 DIAGNOSIS — D72819 Decreased white blood cell count, unspecified: Secondary | ICD-10-CM | POA: Insufficient documentation

## 2017-10-27 DIAGNOSIS — Z7984 Long term (current) use of oral hypoglycemic drugs: Secondary | ICD-10-CM | POA: Diagnosis not present

## 2017-10-27 DIAGNOSIS — I129 Hypertensive chronic kidney disease with stage 1 through stage 4 chronic kidney disease, or unspecified chronic kidney disease: Secondary | ICD-10-CM | POA: Insufficient documentation

## 2017-10-27 DIAGNOSIS — E1122 Type 2 diabetes mellitus with diabetic chronic kidney disease: Secondary | ICD-10-CM | POA: Insufficient documentation

## 2017-10-27 DIAGNOSIS — Z90710 Acquired absence of both cervix and uterus: Secondary | ICD-10-CM | POA: Insufficient documentation

## 2017-10-27 DIAGNOSIS — D696 Thrombocytopenia, unspecified: Secondary | ICD-10-CM

## 2017-10-27 DIAGNOSIS — Z7982 Long term (current) use of aspirin: Secondary | ICD-10-CM | POA: Insufficient documentation

## 2017-10-27 DIAGNOSIS — K625 Hemorrhage of anus and rectum: Secondary | ICD-10-CM | POA: Insufficient documentation

## 2017-10-27 DIAGNOSIS — K219 Gastro-esophageal reflux disease without esophagitis: Secondary | ICD-10-CM | POA: Insufficient documentation

## 2017-10-27 DIAGNOSIS — J45909 Unspecified asthma, uncomplicated: Secondary | ICD-10-CM | POA: Insufficient documentation

## 2017-10-27 DIAGNOSIS — Z79899 Other long term (current) drug therapy: Secondary | ICD-10-CM | POA: Diagnosis not present

## 2017-10-27 DIAGNOSIS — E785 Hyperlipidemia, unspecified: Secondary | ICD-10-CM | POA: Insufficient documentation

## 2017-10-27 DIAGNOSIS — Z9842 Cataract extraction status, left eye: Secondary | ICD-10-CM | POA: Diagnosis not present

## 2017-10-27 LAB — COMPREHENSIVE METABOLIC PANEL
ALT: 24 U/L (ref 14–54)
AST: 32 U/L (ref 15–41)
Albumin: 3.9 g/dL (ref 3.5–5.0)
Alkaline Phosphatase: 68 U/L (ref 38–126)
Anion gap: 7 (ref 5–15)
BUN: 20 mg/dL (ref 6–20)
CO2: 27 mmol/L (ref 22–32)
Calcium: 9.3 mg/dL (ref 8.9–10.3)
Chloride: 99 mmol/L — ABNORMAL LOW (ref 101–111)
Creatinine, Ser: 0.59 mg/dL (ref 0.44–1.00)
GFR calc Af Amer: >60 mL/min (ref >60)
GFR calc non Af Amer: >60 mL/min (ref >60)
Glucose, Bld: 162 mg/dL — ABNORMAL HIGH (ref 65–99)
Potassium: 3.8 mmol/L (ref 3.5–5.1)
Sodium: 133 mmol/L — ABNORMAL LOW (ref 135–145)
Total Bilirubin: 0.8 mg/dL (ref 0.3–1.2)
Total Protein: 7.7 g/dL (ref 6.5–8.1)

## 2017-10-27 LAB — CBC WITH DIFFERENTIAL/PLATELET
Basophils Absolute: 0 10*3/uL (ref 0–0.1)
Basophils Relative: 1 %
Eosinophils Absolute: 0.3 10*3/uL (ref 0–0.7)
Eosinophils Relative: 8 %
HCT: 37.8 % (ref 35.0–47.0)
Hemoglobin: 12.4 g/dL (ref 12.0–16.0)
Lymphocytes Relative: 25 %
Lymphs Abs: 1 10*3/uL (ref 1.0–3.6)
MCH: 29.5 pg (ref 26.0–34.0)
MCHC: 32.7 g/dL (ref 32.0–36.0)
MCV: 90.3 fL (ref 80.0–100.0)
Monocytes Absolute: 1 10*3/uL — ABNORMAL HIGH (ref 0.2–0.9)
Monocytes Relative: 24 %
Neutro Abs: 1.7 10*3/uL (ref 1.4–6.5)
Neutrophils Relative %: 42 %
Platelets: 99 10*3/uL — ABNORMAL LOW (ref 150–440)
RBC: 4.19 MIL/uL (ref 3.80–5.20)
RDW: 16.2 % — ABNORMAL HIGH (ref 11.5–14.5)
WBC: 4.1 10*3/uL (ref 3.6–11.0)

## 2017-10-27 LAB — FERRITIN: Ferritin: 44 ng/mL (ref 11–307)

## 2017-10-27 NOTE — Telephone Encounter (Signed)
Called patent to inform her that her platelets have improved from 79,000 to 99,000.  Patient is pleased.  Thanked me for the call.

## 2017-10-27 NOTE — Progress Notes (Signed)
[facility] day:  10/27/2017  Chief Complaint: Julie Holland is a 77 y.o. female with leukopenia and thrombocytopenia who is seen for 3 month assessment.  HPI:  The patient was last seen in the hematology clinic on 07/27/2017.  At that time, she felt "not too bad". She denied any fevers, sweats, or issues with infections.  Exam revealed no adenopathy or hepatosplenomegaly. WBC was 5,200 with an [place] of 2800. Hemoglobin was 11.9, hematocrit 36.5, and platelets 72,000.  We discussed normalization of her WBC, but persistent thrombocytopenia.  Ultrasound of the spleen was ordered (not performed).  Labs on 07/27/2017 included a hematocrit of 36.5, hemoglobin 11.9, and MCV 88.7.  Retic was 2.3%.  SPEP and free light chain ratio were normal.  Symptomatically, patient is doing "alright". Patient denies bleeding; no hematochezia, melena, or vaginal bleeding. Patient has areas of unexplained bruising. She is on daily low dose aspirin therapy. Patient notes that energy "could be better". Patient is eating "ok". Her weight is down 2 pounds. Patient's husband was recently diagnosed with diabetes, therefore she is more conscious of her dietary intake.  Patient denies B symptoms and recent infections. Patient denies pain in the clinic today.    Past Medical History:  Diagnosis Date  . Arthritis   . Asthma   . 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  . Cataract   . Chronic kidney disease    stage 1  . Diabetes mellitus    type II  . GERD (gastroesophageal reflux disease)    OCC  . Hemorrhoids   . Hyperlipidemia   . Hypertension     Past Surgical History:  Procedure Laterality Date  . CATARACT EXTRACTION, BILATERAL    . CHOLECYSTECTOMY    . COLONOSCOPY  02/13/13   Dr Bary Castilla  . EPIGASTRIC HERNIA REPAIR N/A 03/20/2015   Procedure: HERNIA REPAIR EPIGASTRIC ADULT;  Surgeon: Robert Bellow,  MD;  Location: [facility] ORS;  Service: General;  Laterality: N/A;  . HEMORRHOID SURGERY N/A 03/20/2015    FOCAL HIGH-GRADE SQUAMOUS INTRAEPITHELIAL LESION (HSIL, ANAL /HEMORRHOIDECTOMY;   Robert Bellow, MD [facility] ORS;  : General;  Laterality: N/A;  . HERNIA REPAIR  July 2016   Epigastric hernia, primary repair  . TONSILLECTOMY  age 68  . TOTAL ABDOMINAL HYSTERECTOMY  01/1989  . TUBAL LIGATION    . 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  Case has been presented at the [facility] tumor board. No indication for additional treatment outside of serial exams  Surgeon: Robert Bellow, MD;  Location: [facility] ORS;  Service: General;  Laterality: N/A;    Family History  Problem Relation Age of Onset  . Bladder Cancer Mother   . Colon cancer Father   . Lung cancer Brother   . Lymphoma Sister     Social History:  reports that  has never smoked. she has never used smokeless tobacco. She reports that she does not drink alcohol or use drugs.  She lives in [place] with her husband.  She denies any exposure to radiations or toxins.  The patient is accompanied by her husband, [name], today.  Allergies:  Allergies  Allergen Reactions  . Hydrocodone Nausea And Vomiting and Other (See Comments)    Vertigo but patient takes hydrocodone/acetaminophen outpatient    Current Medications: Current Outpatient Medications  Medication Sig Dispense Refill  . ACCU-CHEK SMARTVIEW test strip USE AS INSTRUCTED TO CHECK  BLOOD GLUCOSE ONCE DAILY 100 each 0  . acetaminophen (TYLENOL) 500 MG tablet Take 500-1,000 mg by mouth every 6 (six) hours as needed (for pain).    . ADVAIR DISKUS 100-50 MCG/DOSE AEPB USE 1 PUFF TWO TIMES DAILY 3 each 2  . albuterol (PROVENTIL HFA;VENTOLIN HFA) 108 (90 Base) MCG/ACT inhaler Inhale 2 puffs into the lungs every 6 (six) hours as needed for wheezing or shortness of breath. 1 Inhaler 2  . aspirin 81 MG  chewable tablet Chew 81 mg by mouth daily.    . Carboxymeth-Glycerin-Polysorb (REFRESH OPTIVE ADVANCED OP) Place 1 drop into both eyes 3 (three) times daily as needed (for dry/irritated eyes).    . carvedilol (COREG) 6.25 MG tablet TAKE 1 TABLET BY MOUTH TWO  TIMES DAILY WITH A MEAL 180 tablet 0  . Ferrous Gluconate (IRON 27 PO) Take 27 mg by mouth.    . latanoprost (XALATAN) 0.005 % ophthalmic solution Place 1 drop at bedtime into the right eye.    Marland Kitchen lisinopril (PRINIVIL,ZESTRIL) 5 MG tablet Take 1 tablet (5 mg total) by mouth daily. 90 tablet 0  . metFORMIN (GLUCOPHAGE) 850 MG tablet Take 1 tablet (850 mg total) by mouth 2 (two) times daily with a meal. 180 tablet 0  . Multiple Vitamin (MULTIVITAMIN) tablet Take 2 tablets by mouth daily.     . Nutritional Supplements (ESTROVEN PO) Take 1 tablet by mouth daily.    . simvastatin (ZOCOR) 20 MG tablet TAKE 1 TABLET BY MOUTH AT  BEDTIME 90 tablet 0   No current facility-administered medications for this visit.     Review of Systems:  GENERAL:  Feels "alright".  Energy level is "ok".  No fevers or sweats.  Weight down 2 pounds since last visit. PERFORMANCE STATUS (ECOG):  0 HEENT:  No visual changes, runny nose, sore throat, mouth sores or tenderness. Lungs: No shortness of breath or cough.  No hemoptysis. Cardiac:  No chest pain, palpitations, orthopnea, or PND. GI:  Occasional diarrhea secondary to cholecystectomy.  No nausea, vomiting, constipation, melena or hematochezia. GU:  No urgency, frequency, dysuria, or hematuria. Musculoskeletal:  No back pain.  Arthritis in hands.  No muscle tenderness. Extremities:  No pain or swelling. Skin:  No rashes or skin changes. Bruising.  Neuro:  No headache, numbness or weakness, balance or coordination issues. Endocrine: Diabetes.  No thyroid issues, hot flashes or night sweats. Psych:  No anxiety or depression. Pain:  No focal pain. Review of systems:  All other systems reviewed and found to be  negative.  Physical Exam: Blood pressure (!) 173/93, pulse 80, temperature (!) 96.8 F (36 C), temperature source Tympanic, resp. rate 18, weight 114 lb 9 oz (52 kg). GENERAL:  Thin woman sitting comfortably in the exam room in no acute distress.  She has a cane at her side. MENTAL STATUS:  Alert and oriented to person, place and time. HEAD:  Short gray hair.  Normocephalic, atraumatic, face symmetric, no Cushingoid features. EYES:  Pupils equal round and reactive to light and accomodation.  No conjunctivitis or scleral icterus. ENT:  Oropharynx clear without lesion.  Tongue normal. Mucous membranes moist.  RESPIRATORY:  Clear to auscultation without rales, wheezes or rhonchi. CARDIOVASCULAR:  Regular rate and rhythm without murmur, rub or gallop. ABDOMEN:  Soft, non-tender, with active bowel sounds, and no hepatosplenomegaly.  No masses. SKIN:  Right wrist bruise.  No rashes,  ulcers or lesions. EXTREMITIES:  Left ankle edema.  No skin discoloration or tenderness.  No palpable cords. LYMPH NODES: No palpable cervical, supraclavicular, axillary or inguinal adenopathy  NEUROLOGICAL: Unremarkable. PSYCH:  Appropriate.   Appointment on 10/27/2017  Component Date Value Ref Range Status  . Sodium 10/27/2017 133* 135 - 145 mmol/L Final  . Potassium 10/27/2017 3.8  3.5 - 5.1 mmol/L Final  . Chloride 10/27/2017 99* 101 - 111 mmol/L Final  . CO2 10/27/2017 27  22 - 32 mmol/L Final  . Glucose, Bld 10/27/2017 162* 65 - 99 mg/dL Final  . BUN 10/27/2017 20  6 - 20 mg/dL Final  . Creatinine, Ser 10/27/2017 0.59  0.44 - 1.00 mg/dL Final  . Calcium 10/27/2017 9.3  8.9 - 10.3 mg/dL Final  . Total Protein 10/27/2017 7.7  6.5 - 8.1 g/dL Final  . Albumin 10/27/2017 3.9  3.5 - 5.0 g/dL Final  . AST 10/27/2017 32  15 - 41 U/L Final  . ALT 10/27/2017 24  14 - 54 U/L Final  . Alkaline Phosphatase 10/27/2017 68  38 - 126 U/L Final  . Total Bilirubin 10/27/2017 0.8  0.3 - 1.2 mg/dL Final  . GFR calc non Af  Amer 10/27/2017 >60  >60 mL/min Final  . GFR calc Af Amer 10/27/2017 >60  >60 mL/min Final   Comment: (NOTE) The eGFR has been calculated using the CKD EPI equation. This calculation has not been validated in all clinical situations. eGFR's persistently <60 mL/min signify possible Chronic Kidney Disease.   Georgiann Hahn gap 10/27/2017 7  5 - 15 Final   Performed at [facility], [address] [place], [place] [address]    Assessment:  BRADLEY HANDYSIDE is a 77 y.o. female with leukopenia (resolved) and thrombocytopenia (persistent) of unclear etiology.  She may have an underlying myelodysplastic syndrome or chronic immune mediated thrombocytopenic purpura (ITP).  She denies any new medications or herbal products.  She denies any quinine water.  Her diet appears good.   Labs dating back to 05/06/2011 revealed intermittent leukopenia with a WBC ranging from 2600- 6600.  ANC has been > 1000.  Platelet count was intermittently low before 08/01/2013.  Since 03/13/2015, she has had thrombocytopenia < 100,000.  Hematocrit has been normal, except with her episode of rectal bleeding in 07/2016.   Work-up on 10/30/2016 revealed a hematocrit of 36.2, hemoglobin 12.1, MCV 88.1, platelets 70,000, white count 3100 with an ANC of 1800.  Differential was unremarkable.  Normal studies included:  B12, folate, TSH, ANA, hepatitis B core antibody, hepatitis C antibody, HIV antibody, copper, and PTT.  SPEP and free light chains were normal on 07/27/2018.  Peripheral smear revealed giant platelets. Red blood cell and white cell morphology were unremarkable.  She has a history of rectal bleeding requiring 2 units of PRBCs on 07/31/2016.  She underwent oversewing of a bleeding arterial vessel at the site of previous mucosal resection.   She has taken oral iron since her surgery.  Symptomatically, she feels "ok". She denies any fevers, sweats, or issues with infections.  Exam reveals no adenopathy or hepatosplenomegaly.  WBC is 4100 with an [place] of 1700. Hemoglobin 12.4, hematocrit 37.8,  platelets 99,000 (improved).  Plan: 1.  Labs today:  CBC with diff, CMP, ferritin. 2.  Discuss improving counts.  Leukopenia has resolved.  Platelets improving.  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 3.  Reschedule abdominal ultrasound. 4.  RTC in 3 months for MD assessment and labs (  CBC with diff, CMP).  Addendum:  Abdominal ultrasound on 10/30/2017 revealed a normal spleen.   Honor Loh, NP  10/27/2017, 12:11 PM   I saw and evaluated the patient, participating in the key portions of the service and reviewing pertinent diagnostic studies and records.  I reviewed the nurse practitioner's note and agree with the findings and the plan.  The assessment and plan were discussed with the patient.  A few questions were asked by the patient and answered.   Lequita Asal, MD 10/27/2017,12:11 PM

## 2017-10-27 NOTE — Telephone Encounter (Signed)
-----   Message from Lequita Asal, MD sent at 10/27/2017  1:42 PM EST ----- Regarding: Please call patient with platelet count  Improved from 72,000 to 99,000  M  ----- Message ----- From: Interface, Lab In [place] Sent: 10/27/2017  11:34 AM To: Lequita Asal, MD

## 2017-10-27 NOTE — Progress Notes (Signed)
Patient offers no complaints today. 

## 2017-10-30 ENCOUNTER — Ambulatory Visit
Admission: RE | Admit: 2017-10-30 | Discharge: 2017-10-30 | Disposition: A | Discharge status: Home / Self Care | Payer: Medicare Other | Source: Ambulatory Visit | Attending: Urgent Care | Admitting: Urgent Care

## 2017-10-30 DIAGNOSIS — D696 Thrombocytopenia, unspecified: Secondary | ICD-10-CM | POA: Diagnosis not present

## 2017-11-19 DIAGNOSIS — E1121 Type 2 diabetes mellitus with diabetic nephropathy: Secondary | ICD-10-CM | POA: Diagnosis not present

## 2017-11-19 DIAGNOSIS — R809 Proteinuria, unspecified: Secondary | ICD-10-CM | POA: Diagnosis not present

## 2017-11-19 DIAGNOSIS — I10 Essential (primary) hypertension: Secondary | ICD-10-CM | POA: Diagnosis not present

## 2017-11-26 ENCOUNTER — Other Ambulatory Visit: Payer: Self-pay

## 2017-11-26 DIAGNOSIS — I10 Essential (primary) hypertension: Secondary | ICD-10-CM

## 2017-11-26 MED ORDER — CARVEDILOL 6.25 MG PO TABS
6.2500 mg | ORAL_TABLET | Freq: Two times a day (BID) | ORAL | 1 refills | Status: AC
Start: 2017-11-26 — End: ?

## 2017-11-26 NOTE — Telephone Encounter (Signed)
90 day supply

## 2017-12-02 ENCOUNTER — Ambulatory Visit (INDEPENDENT_AMBULATORY_CARE_PROVIDER_SITE_OTHER): Payer: Medicare Other | Admitting: Obstetrics & Gynecology

## 2017-12-02 ENCOUNTER — Encounter: Payer: Self-pay | Admitting: Obstetrics & Gynecology

## 2017-12-02 VITALS — BP 140/90 | Ht 65.0 in | Wt 116.0 lb

## 2017-12-02 DIAGNOSIS — Z1272 Encounter for screening for malignant neoplasm of vagina: Secondary | ICD-10-CM | POA: Diagnosis not present

## 2017-12-02 DIAGNOSIS — Z0000 Encounter for general adult medical examination without abnormal findings: Secondary | ICD-10-CM | POA: Diagnosis not present

## 2017-12-02 DIAGNOSIS — Z1211 Encounter for screening for malignant neoplasm of colon: Secondary | ICD-10-CM | POA: Diagnosis not present

## 2017-12-02 NOTE — Patient Instructions (Signed)
PAP every three years Mammogram every year    Call [phone] to schedule at [facility] Colonoscopy every 10 years Labs yearly (with PCP) 

## 2017-12-02 NOTE — Progress Notes (Signed)
HPI:      Julie Holland is a 77 y.o. [id number] who LMP was in the past, she presents today for her annual examination.  The patient has no complaints today. The patient is not sexually active. Herlast pap: approximate date 2017 and was abnormal: ASCUS (vaginal PAP) and last mammogram: approximate date 2018 and was normal.  The patient does perform self breast exams.  There is notable family history of breast or ovarian cancer in her family. The patient is not taking hormone replacement therapy. Patient denies post-menopausal vaginal bleeding.   The patient has regular exercise: yes. The patient denies current symptoms of depression.    GYN Hx: Last Colonoscopy:few years ago. Normal.  Last DEXA: few years ago.    PMHx: Past Medical History:  Diagnosis Date  . Arthritis   . Asthma   . 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  . Cataract   . Chronic kidney disease    stage 1  . Diabetes mellitus    type II  . Dry eye of right side   . GERD (gastroesophageal reflux disease)    OCC  . Hemorrhoids   . Hyperlipidemia   . Hypertension    Past Surgical History:  Procedure Laterality Date  . CATARACT EXTRACTION, BILATERAL    . CHOLECYSTECTOMY    . COLONOSCOPY  02/13/13   Dr Bary Castilla  . EPIGASTRIC HERNIA REPAIR N/A 03/20/2015   Procedure: HERNIA REPAIR EPIGASTRIC ADULT;  Surgeon:  Bellow, MD;  Location: [facility] ORS;  Service: General;  Laterality: N/A;  . HEMORRHOID SURGERY N/A 03/20/2015    FOCAL HIGH-GRADE SQUAMOUS INTRAEPITHELIAL LESION (HSIL, ANAL /HEMORRHOIDECTOMY;    Bellow, MD [facility] ORS;  : General;  Laterality: N/A;  . HERNIA REPAIR  July 2016   Epigastric hernia, primary repair  . TONSILLECTOMY  age 6  . TOTAL ABDOMINAL HYSTERECTOMY  01/1989  . TUBAL LIGATION    . 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  Case  has been presented at the [facility] tumor board. No indication for additional treatment outside of serial exams  Surgeon:  Bellow, MD;  Location: [facility] ORS;  Service: General;  Laterality: N/A;   Family History  Problem Relation Age of Onset  . Bladder Cancer Mother   . Colon cancer Father   . Lung cancer Brother   . Lymphoma Sister    Social History   Tobacco Use  . Smoking status: Never Smoker  . Smokeless tobacco: Never Used  Substance Use Topics  . Alcohol use: No  . Drug use: No    Current Outpatient Medications:  .  ACCU-CHEK SMARTVIEW test strip, USE AS INSTRUCTED TO CHECK  BLOOD GLUCOSE ONCE DAILY, Disp: 100 each, Rfl: 0 .  acetaminophen (TYLENOL) 500 MG tablet, Take 500-1,000 mg by mouth every 6 (six) hours as needed (for pain)., Disp: , Rfl:  .  ADVAIR DISKUS 100-50 MCG/DOSE AEPB, USE 1 PUFF TWO TIMES DAILY, Disp: 3 each, Rfl: 2 .  albuterol (PROVENTIL HFA;VENTOLIN HFA) 108 (90 Base) MCG/ACT inhaler, Inhale 2 puffs into the lungs every 6 (six) hours as needed for wheezing or shortness of breath., Disp: 1 Inhaler, Rfl: 2 .  aspirin 81 MG chewable tablet, Chew 81 mg by mouth daily., Disp: , Rfl:  .  Carboxymeth-Glycerin-Polysorb (REFRESH OPTIVE ADVANCED OP), Place 1 drop into both eyes 3 (  three) times daily as needed (for dry/irritated eyes)., Disp: , Rfl:  .  carvedilol (COREG) 6.25 MG tablet, Take 1 tablet (6.25 mg total) by mouth 2 (two) times daily with a meal., Disp: 180 tablet, Rfl: 1 .  Ferrous Gluconate (IRON 27 PO), Take 27 mg by mouth., Disp: , Rfl:  .  latanoprost (XALATAN) 0.005 % ophthalmic solution, Place 1 drop at bedtime into the right eye., Disp: , Rfl:  .  lisinopril (PRINIVIL,ZESTRIL) 5 MG tablet, Take 1 tablet (5 mg total) by mouth daily., Disp: 90 tablet, Rfl: 0 .  metFORMIN (GLUCOPHAGE) 850 MG tablet, Take 1 tablet (850 mg total) by mouth 2 (two) times daily with a meal., Disp: 180 tablet, Rfl: 0 .  Multiple Vitamin (MULTIVITAMIN) tablet, Take 2  tablets by mouth daily. , Disp: , Rfl:  .  Nutritional Supplements (ESTROVEN PO), Take 1 tablet by mouth daily., Disp: , Rfl:  .  simvastatin (ZOCOR) 20 MG tablet, TAKE 1 TABLET BY MOUTH AT  BEDTIME, Disp: 90 tablet, Rfl: 0 Allergies: Hydrocodone  Review of Systems  Constitutional: Negative for chills, fever and malaise/fatigue.  HENT: Negative for congestion, sinus pain and sore throat.   Eyes: Negative for blurred vision and pain.  Respiratory: Negative for cough and wheezing.   Cardiovascular: Negative for chest pain and leg swelling.  Gastrointestinal: Negative for abdominal pain, constipation, diarrhea, heartburn, nausea and vomiting.  Genitourinary: Negative for dysuria, frequency, hematuria and urgency.  Musculoskeletal: Negative for back pain, joint pain, myalgias and neck pain.  Skin: Negative for itching and rash.  Neurological: Negative for dizziness, tremors and weakness.  Endo/Heme/Allergies: Does not bruise/bleed easily.  Psychiatric/Behavioral: Negative for depression. The patient is not nervous/anxious and does not have insomnia.     Objective: BP 140/90   Ht 5\' 5"  (1.651 m)   Wt 116 lb (52.6 kg)   BMI 19.30 kg/m   Filed Weights   12/02/17 0852  Weight: 116 lb (52.6 kg)   Body mass index is 19.3 kg/m. Physical Exam  Constitutional: She is oriented to person, place, and time. She appears well-developed and well-nourished. No distress.  Genitourinary: Rectum normal and vagina normal. Pelvic exam was performed with patient supine. There is no rash or lesion on the right labia. There is no rash or lesion on the left labia. Vagina exhibits no lesion. No bleeding in the vagina. Right adnexum does not display mass and does not display tenderness. Left adnexum does not display mass and does not display tenderness.  Genitourinary Comments: Absent Uterus Absent cervix Vaginal cuff well healed Cystocele Gr 2 and vaginal prolapse mild.  Atrophy  HENT:  Head: Normocephalic  and atraumatic. Head is without laceration.  Right Ear: Hearing normal.  Left Ear: Hearing normal.  Nose: No epistaxis.  No foreign bodies.  Mouth/Throat: Uvula is midline, oropharynx is clear and moist and mucous membranes are normal.  Eyes: Pupils are equal, round, and reactive to light.  Neck: Normal range of motion. Neck supple. No thyromegaly present.  Cardiovascular: Normal rate and regular rhythm. Exam reveals no gallop and no friction rub.  No murmur heard. Pulmonary/Chest: Effort normal and breath sounds normal. No respiratory distress. She has no wheezes. Right breast exhibits no mass, no skin change and no tenderness. Left breast exhibits no mass, no skin change and no tenderness.  Abdominal: Soft. Bowel sounds are normal. She exhibits no distension. There is no tenderness. There is no rebound.  Musculoskeletal: Normal range of motion.  Neurological: She is alert  and oriented to person, place, and time. No cranial nerve deficit.  Skin: Skin is warm and dry.  Psychiatric: She has a normal mood and affect. Judgment normal.  Vitals reviewed.  Assessment: Annual Exam 1. Screen for colon cancer   2. Screening for vaginal cancer   3. Annual physical exam    Plan:            1.  Cervical Screening-  Pap smear done today  2. Breast screening- Exam annually and mammogram scheduled  3. Colonoscopy every 10 years, Hemoccult testing after age 70  4. Labs managed by PCP  5. Counseling for hormonal therapy: none  6. Mild cystocele, min GSI.  Monitor.    F/U  Return in about 1 year (around 12/03/2018) for Annual.  Barnett Applebaum, MD, [facility], [facility] 12/02/2017  9:16 AM

## 2017-12-03 ENCOUNTER — Other Ambulatory Visit: Payer: Self-pay | Admitting: Obstetrics & Gynecology

## 2017-12-03 DIAGNOSIS — Z1231 Encounter for screening mammogram for malignant neoplasm of breast: Secondary | ICD-10-CM

## 2017-12-04 LAB — PAP IG (IMAGE GUIDED): PAP Smear Comment: 0

## 2017-12-10 ENCOUNTER — Encounter: Payer: Self-pay | Admitting: Family Medicine

## 2017-12-10 ENCOUNTER — Ambulatory Visit (INDEPENDENT_AMBULATORY_CARE_PROVIDER_SITE_OTHER): Payer: Medicare Other | Admitting: Family Medicine

## 2017-12-10 DIAGNOSIS — I10 Essential (primary) hypertension: Secondary | ICD-10-CM

## 2017-12-10 DIAGNOSIS — R809 Proteinuria, unspecified: Secondary | ICD-10-CM | POA: Diagnosis not present

## 2017-12-10 DIAGNOSIS — D709 Neutropenia, unspecified: Secondary | ICD-10-CM | POA: Diagnosis not present

## 2017-12-10 DIAGNOSIS — Z23 Encounter for immunization: Secondary | ICD-10-CM

## 2017-12-10 DIAGNOSIS — D696 Thrombocytopenia, unspecified: Secondary | ICD-10-CM | POA: Diagnosis not present

## 2017-12-10 DIAGNOSIS — E782 Mixed hyperlipidemia: Secondary | ICD-10-CM

## 2017-12-10 DIAGNOSIS — D013 Carcinoma in situ of anus and anal canal: Secondary | ICD-10-CM

## 2017-12-10 DIAGNOSIS — E0821 Diabetes mellitus due to underlying condition with diabetic nephropathy: Secondary | ICD-10-CM | POA: Diagnosis not present

## 2017-12-10 DIAGNOSIS — J4520 Mild intermittent asthma, uncomplicated: Secondary | ICD-10-CM

## 2017-12-10 MED ORDER — LISINOPRIL 10 MG PO TABS: 10.0000 mg | ORAL_TABLET | Freq: Every day | ORAL | 0 refills | Status: DC

## 2017-12-10 NOTE — Patient Instructions (Addendum)
Let's increase your lisinopril from 5 mg to 10 mg daily (it's okay to take 5 mg pills at the same time to equal 10 mg until you run out) Return in 1 week for BP check with the CMA here Try to follow the DASH guidelines (DASH stands for Dietary Approaches to Stop Hypertension). Try to limit the sodium in your diet to no more than 1,500mg  of sodium per day. Certainly try to not exceed 2,000 mg per day at the very most. Do not add salt when cooking or at the table.  Check the sodium amount on labels when shopping, and choose items lower in sodium when given a choice. Avoid or limit foods that already contain a lot of sodium. Eat a diet rich in fruits and vegetables and whole grains, and try to lose weight if overweight or obese  DASH Eating Plan DASH stands for "Dietary Approaches to Stop Hypertension." The DASH eating plan is a healthy eating plan that has been shown to reduce high blood pressure (hypertension). It may also reduce your risk for type 2 diabetes, heart disease, and stroke. The DASH eating plan may also help with weight loss. What are tips for following this plan? General guidelines  Avoid eating more than 2,300 mg (milligrams) of salt (sodium) a day. If you have hypertension, you may need to reduce your sodium intake to 1,500 mg a day.  Limit alcohol intake to no more than 1 drink a day for nonpregnant women and 2 drinks a day for men. One drink equals 12 oz of beer, 5 oz of wine, or 1 oz of hard liquor.  Work with your health care provider to maintain a healthy body weight or to lose weight. Ask what an ideal weight is for you.  Get at least 30 minutes of exercise that causes your heart to beat faster (aerobic exercise) most days of the week. Activities may include walking, swimming, or biking.  Work with your health care provider or diet and nutrition specialist (dietitian) to adjust your eating plan to your individual calorie needs. Reading food labels  Check food labels for the  amount of sodium per serving. Choose foods with less than 5 percent of the Daily Value of sodium. Generally, foods with less than 300 mg of sodium per serving fit into this eating plan.  To find whole grains, look for the word "whole" as the first word in the ingredient list. Shopping  Buy products labeled as "low-sodium" or "no salt added."  Buy fresh foods. Avoid canned foods and premade or frozen meals. Cooking  Avoid adding salt when cooking. Use salt-free seasonings or herbs instead of table salt or sea salt. Check with your health care provider or pharmacist before using salt substitutes.  Do not fry foods. Cook foods using healthy methods such as baking, boiling, grilling, and broiling instead.  Cook with heart-healthy oils, such as olive, canola, soybean, or sunflower oil. Meal planning   Eat a balanced diet that includes: ? 5 or more servings of fruits and vegetables each day. At each meal, try to fill half of your plate with fruits and vegetables. ? Up to 6-8 servings of whole grains each day. ? Less than 6 oz of lean meat, poultry, or fish each day. A 3-oz serving of meat is about the same size as a deck of cards. One egg equals 1 oz. ? 2 servings of low-fat dairy each day. ? A serving of nuts, seeds, or beans 5 times each week. ?  Heart-healthy fats. Healthy fats called Omega-3 fatty acids are found in foods such as flaxseeds and coldwater fish, like sardines, salmon, and mackerel.  Limit how much you eat of the following: ? Canned or prepackaged foods. ? Food that is high in trans fat, such as fried foods. ? Food that is high in saturated fat, such as fatty meat. ? Sweets, desserts, sugary drinks, and other foods with added sugar. ? Full-fat dairy products.  Do not salt foods before eating.  Try to eat at least 2 vegetarian meals each week.  Eat more home-cooked food and less restaurant, buffet, and fast food.  When eating at a restaurant, ask that your food be  prepared with less salt or no salt, if possible. What foods are recommended? The items listed may not be a complete list. Talk with your dietitian about what dietary choices are best for you. Grains Whole-grain or whole-wheat bread. Whole-grain or whole-wheat pasta. Brown rice. [place]. Bulgur. Whole-grain and low-sodium cereals. Pita bread. Low-fat, low-sodium crackers. Whole-wheat flour tortillas. Vegetables Fresh or frozen vegetables (raw, steamed, roasted, or grilled). Low-sodium or reduced-sodium tomato and vegetable juice. Low-sodium or reduced-sodium tomato sauce and tomato paste. Low-sodium or reduced-sodium canned vegetables. Fruits All fresh, dried, or frozen fruit. Canned fruit in natural juice (without added sugar). Meat and other protein foods Skinless chicken or [place]. Ground chicken or [place]. Pork with fat trimmed off. Fish and seafood. Egg whites. Dried beans, peas, or lentils. Unsalted nuts, nut butters, and seeds. Unsalted canned beans. Lean cuts of beef with fat trimmed off. Low-sodium, lean deli meat. Dairy Low-fat (1%) or fat-free (skim) milk. Fat-free, low-fat, or reduced-fat cheeses. Nonfat, low-sodium ricotta or cottage cheese. Low-fat or nonfat yogurt. Low-fat, low-sodium cheese. Fats and oils Soft margarine without trans fats. Vegetable oil. Low-fat, reduced-fat, or light mayonnaise and salad dressings (reduced-sodium). Canola, safflower, olive, soybean, and sunflower oils. Avocado. Seasoning and other foods Herbs. Spices. Seasoning mixes without salt. Unsalted popcorn and pretzels. Fat-free sweets. What foods are not recommended? The items listed may not be a complete list. Talk with your dietitian about what dietary choices are best for you. Grains Baked goods made with fat, such as croissants, muffins, or some breads. Dry pasta or rice meal packs. Vegetables Creamed or fried vegetables. Vegetables in a cheese sauce. Regular canned vegetables (not  low-sodium or reduced-sodium). Regular canned tomato sauce and paste (not low-sodium or reduced-sodium). Regular tomato and vegetable juice (not low-sodium or reduced-sodium). Angie Fava. Olives. Fruits Canned fruit in a light or heavy syrup. Fried fruit. Fruit in cream or butter sauce. Meat and other protein foods Fatty cuts of meat. Ribs. Fried meat. Berniece Salines. Sausage. Bologna and other processed lunch meats. Salami. Fatback. Hotdogs. Bratwurst. Salted nuts and seeds. Canned beans with added salt. Canned or smoked fish. Whole eggs or egg yolks. Chicken or [place] with skin. Dairy Whole or 2% milk, cream, and half-and-half. Whole or full-fat cream cheese. Whole-fat or sweetened yogurt. Full-fat cheese. Nondairy creamers. Whipped toppings. Processed cheese and cheese spreads. Fats and oils Butter. Stick margarine. Lard. Shortening. Ghee. Bacon fat. Tropical oils, such as coconut, palm kernel, or palm oil. Seasoning and other foods Salted popcorn and pretzels. Onion salt, garlic salt, seasoned salt, table salt, and sea salt. Worcestershire sauce. Tartar sauce. Barbecue sauce. Teriyaki sauce. Soy sauce, including reduced-sodium. Steak sauce. Canned and packaged gravies. Fish sauce. Oyster sauce. Cocktail sauce. Horseradish that you find on the shelf. Ketchup. Mustard. Meat flavorings and tenderizers. Bouillon cubes. Hot sauce and Tabasco sauce.  Premade or packaged marinades. Premade or packaged taco seasonings. Relishes. Regular salad dressings. Where to find more information:  National Heart, Lung, and [place]: https://wilson-eaton.com/  American Heart Association: www.heart.org Summary  The DASH eating plan is a healthy eating plan that has been shown to reduce high blood pressure (hypertension). It may also reduce your risk for type 2 diabetes, heart disease, and stroke.  With the DASH eating plan, you should limit salt (sodium) intake to 2,300 mg a day. If you have hypertension, you may need to reduce  your sodium intake to 1,500 mg a day.  When on the DASH eating plan, aim to eat more fresh fruits and vegetables, whole grains, lean proteins, low-fat dairy, and heart-healthy fats.  Work with your health care provider or diet and nutrition specialist (dietitian) to adjust your eating plan to your individual calorie needs. This information is not intended to replace advice given to you by your health care provider. Make sure you discuss any questions you have with your health care provider. Document Released: 08/14/2011 Document Revised: 08/18/2016 Document Reviewed: 08/18/2016 Elsevier Interactive Patient Education  2018 [employer].  Cholesterol Cholesterol is a fat. Your body needs a small amount of cholesterol. Cholesterol (plaque) may build up in your blood vessels (arteries). That makes you more likely to have a heart attack or stroke. You cannot feel your cholesterol level. Having a blood test is the only way to find out if your level is high. Keep your test results. Work with your doctor to keep your cholesterol at a good level. What do the results mean?  Total cholesterol is how much cholesterol is in your blood.  LDL is bad cholesterol. This is the type that can build up. Try to have low LDL.  HDL is good cholesterol. It cleans your blood vessels and carries LDL away. Try to have high HDL.  Triglycerides are fat that the body can store or burn for energy. What are good levels of cholesterol?  Total cholesterol below 200.  LDL below 100 is good for people who have health risks. LDL below 70 is good for people who have very high risks.  HDL above 40 is good. It is best to have HDL of 60 or higher.  Triglycerides below 150. How can I lower my cholesterol? Diet Follow your diet program as told by your doctor.  Choose fish, white meat chicken, or [place] that is roasted or baked. Try not to eat red meat, fried foods, sausage, or lunch meats.  Eat lots of fresh fruits and  vegetables.  Choose whole grains, beans, pasta, potatoes, and cereals.  Choose olive oil, corn oil, or canola oil. Only use small amounts.  Try not to eat butter, mayonnaise, shortening, or palm kernel oils.  Try not to eat foods with trans fats.  Choose low-fat or nonfat dairy foods. ? Drink skim or nonfat milk. ? Eat low-fat or nonfat yogurt and cheeses. ? Try not to drink whole milk or cream. ? Try not to eat ice cream, egg yolks, or full-fat cheeses.  Healthy desserts include angel food cake, ginger snaps, animal crackers, hard candy, popsicles, and low-fat or nonfat frozen yogurt. Try not to eat pastries, cakes, pies, and cookies.  Exercise Follow your exercise program as told by your doctor.  Be more active. Try gardening, walking, and taking the stairs.  Ask your doctor about ways that you can be more active.  Medicine  Take over-the-counter and prescription medicines only as told by your  doctor. This information is not intended to replace advice given to you by your health care provider. Make sure you discuss any questions you have with your health care provider. Document Released: 11/21/2008 Document Revised: 03/26/2016 Document Reviewed: 03/06/2016 Elsevier Interactive Patient Education  [employer].

## 2017-12-10 NOTE — Assessment & Plan Note (Signed)
Monitored by Dr. Mike Gip, heme-onc

## 2017-12-10 NOTE — Progress Notes (Signed)
BP (!) 150/90 (BP Location: Left Arm, Cuff Size: Normal)   Pulse 87   Temp 98.3 F (36.8 C) (Oral)   Ht 5\' 5"  (1.651 m)   Wt 116 lb 3.2 oz (52.7 kg)   SpO2 98%   BMI 19.34 kg/m    Subjective:    Patient ID: Julie Holland, female    DOB: April 25, 1941, 77 y.o.   MRN: [id number]  HPI: Julie Holland is a 77 y.o. female  Chief Complaint  Patient presents with  . Follow-up    dicuss aspirin     HPI Patient is new to me; previous provider left our practice She has had asthma for 30 years; on Advair; it works; she had tried some other medicine; her triggers are so infrequent; maybe real cold weather; no hospitalizations; a few ER trips, but never admitted Had the PPSV-23 vaccine; flu shots yearly; checking on the PCV-13  High blood pressure; no issues at home; she has white coat syndrome; saw renal doctor in March; he considered increasing ACE-I; still high today; readings there were in the 150s; she is not adding salt to anything  Type 2 diabetes; going on 10 years; managed with metformin and diet controlled; last eye exam in the last year Lab Results  Component Value Date   HGBA1C 6.7 09/11/2017  Eye doctor visit UTD; glaucome, on drops  High cholesterol; does eat some sausage and eggs, not every day  Low WBC and platelets; seeing heme-onc every 3 months; monitored by Dr. Pearla Dubonnet Dr. Kenton Kingfisher a few weeks ago; he ordered a mammogram  Depression screen [facility] 2/9 12/10/2017 09/11/2017 06/11/2017 03/09/2017 01/26/2017  Decreased Interest 0 0 0 0 0  Down, Depressed, Hopeless 0 0 0 0 0  PHQ - 2 Score 0 0 0 0 0    Relevant past medical, surgical, family and social history reviewed Past Medical History:  Diagnosis Date  . Arthritis   . Asthma   . 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  . Cataract   . Chronic kidney disease    stage 1  . Diabetes mellitus    type II  . Dry eye of right side   . GERD  (gastroesophageal reflux disease)    OCC  . Glaucoma 2018   RIGHT EYE   . Hemorrhoids   . Hyperlipidemia   . Hypertension    Past Surgical History:  Procedure Laterality Date  . CATARACT EXTRACTION, BILATERAL    . CHOLECYSTECTOMY    . COLONOSCOPY  02/13/13   Dr Bary Castilla  . EPIGASTRIC HERNIA REPAIR N/A 03/20/2015   Procedure: HERNIA REPAIR EPIGASTRIC ADULT;  Surgeon: Robert Bellow, MD;  Location: [facility] ORS;  Service: General;  Laterality: N/A;  . HEMORRHOID SURGERY N/A 03/20/2015    FOCAL HIGH-GRADE SQUAMOUS INTRAEPITHELIAL LESION (HSIL, ANAL /HEMORRHOIDECTOMY;   Robert Bellow, MD [facility] ORS;  : General;  Laterality: N/A;  . HERNIA REPAIR  July 2016   Epigastric hernia, primary repair  . TONSILLECTOMY  age 69  . TOTAL ABDOMINAL HYSTERECTOMY  01/1989  . TUBAL LIGATION    . 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  Case has been presented at the [facility] tumor board. No indication for additional treatment outside of serial exams  Surgeon: Robert Bellow, MD;  Location: [facility] ORS;  Service: General;  Laterality: N/A;  Family History  Problem Relation Age of Onset  . Bladder Cancer Mother   . Colon cancer Father   . Lung cancer Brother   . Lymphoma Sister    Social History   Tobacco Use  . Smoking status: Never Smoker  . Smokeless tobacco: Never Used  Substance Use Topics  . Alcohol use: No  . Drug use: No    Interim medical history since last visit reviewed. Allergies and medications reviewed  Review of Systems  Constitutional: Negative for unexpected weight change.  Respiratory: Negative for shortness of breath and wheezing.   Cardiovascular: Negative for chest pain.  Hematological: Bruises/bleeds easily (on low dose aspirin and low platelets).   Per HPI unless specifically indicated above     Objective:    BP (!) 150/90 (BP Location: Left Arm, Cuff Size: Normal)   Pulse 87    Temp 98.3 F (36.8 C) (Oral)   Ht 5\' 5"  (1.651 m)   Wt 116 lb 3.2 oz (52.7 kg)   SpO2 98%   BMI 19.34 kg/m   Wt Readings from Last 3 Encounters:  12/10/17 116 lb 3.2 oz (52.7 kg)  12/02/17 116 lb (52.6 kg)  10/27/17 114 lb 9 oz (52 kg)    Physical Exam  Constitutional: She appears well-developed and well-nourished. No distress.  HENT:  Head: Normocephalic and atraumatic.  Eyes: EOM are normal. No scleral icterus.  Neck: No thyromegaly present.  Cardiovascular: Normal rate and regular rhythm.  No murmur heard. Pulmonary/Chest: Effort normal and breath sounds normal. She has no wheezes.  Abdominal: Soft. Bowel sounds are normal. She exhibits no distension.  Musculoskeletal: She exhibits no edema.  Neurological: She is alert.  Skin: Skin is warm and dry. She is not diaphoretic. No pallor.  Psychiatric: She has a normal mood and affect. Her behavior is normal. Judgment and thought content normal.    Results for orders placed or performed in visit on 12/02/17  Pap IG (Image Guided)  Result Value Ref Range   DIAGNOSIS: Comment    Specimen adequacy: Comment    Clinician Provided ICD10 Comment    Performed by: Comment    PAP Smear Comment .    Note: Comment    Test Methodology Comment       Assessment & Plan:   Problem List Items Addressed This Visit      Cardiovascular and Mediastinum   Hypertension    Try DASH guidelines; adjust medicine; return in 1 week to see CMA      Relevant Medications   lisinopril (PRINIVIL,ZESTRIL) 10 MG tablet     Respiratory   Asthma    Well-controlled        Endocrine   Diabetes mellitus, controlled ([place])    Foot exam by MD today; eye exam UTD; continue aspirin for now until [facility] or ACE stay to stop      Relevant Medications   lisinopril (PRINIVIL,ZESTRIL) 10 MG tablet   Other Relevant Orders   Lipid panel   Hemoglobin A1c     Musculoskeletal and Integument   Anal intraepithelial neoplasia III (AIN III)    seeing Dr.  Bary Castilla        Other   Urine test positive for microalbuminuria    Seeing nephrologist now      Relevant Medications   lisinopril (PRINIVIL,ZESTRIL) 10 MG tablet   Thrombocytopenia (H[facility])    Monitored by Dr. Mike Gip, heme-onc      Neutropenia [facility])    Monitored by Dr. Mike Gip, heme-onc  Need for vaccination with 13-polyvalent pneumococcal conjugate vaccine    PCV-13 given today; no further pneumonia vaccines per ACIP guidelines      Hyperlipidemia    Check lipids, limit saturated fats      Relevant Medications   lisinopril (PRINIVIL,ZESTRIL) 10 MG tablet   Other Relevant Orders   Lipid panel       Follow up plan: Return in about 4 months (around 04/05/2018) for follow-up visit with Dr. Sanda Klein.  An after-visit summary was printed and given to the patient at [place].  Please see the patient instructions which may contain other information and recommendations beyond what is mentioned above in the assessment and plan.  Meds ordered this encounter  Medications  . lisinopril (PRINIVIL,ZESTRIL) 10 MG tablet    Sig: Take 1 tablet (10 mg total) by mouth daily.    Dispense:  90 tablet    Refill:  0    Orders Placed This Encounter  Procedures  . Pneumococcal conjugate vaccine 13-valent IM  . Lipid panel  . Hemoglobin A1c

## 2017-12-10 NOTE — Assessment & Plan Note (Signed)
Seeing nephrologist now

## 2017-12-10 NOTE — Assessment & Plan Note (Addendum)
Foot exam by MD today; eye exam UTD; continue aspirin for now until [facility] or ACE stay to stop

## 2017-12-10 NOTE — Assessment & Plan Note (Signed)
Check lipids, limit saturated fats 

## 2017-12-10 NOTE — Assessment & Plan Note (Signed)
Try DASH guidelines; adjust medicine; return in 1 week to see CMA

## 2017-12-10 NOTE — Assessment & Plan Note (Signed)
Well controlled 

## 2017-12-10 NOTE — Assessment & Plan Note (Signed)
seeing Dr. Bary Castilla

## 2017-12-10 NOTE — Assessment & Plan Note (Signed)
PCV-13 given today; no further pneumonia vaccines per ACIP guidelines

## 2017-12-11 ENCOUNTER — Encounter: Payer: Self-pay | Admitting: Family Medicine

## 2017-12-17 ENCOUNTER — Other Ambulatory Visit: Payer: Self-pay

## 2017-12-17 DIAGNOSIS — E119 Type 2 diabetes mellitus without complications: Secondary | ICD-10-CM

## 2017-12-17 MED ORDER — ACCU-CHEK SMARTVIEW VI STRP: ORAL_STRIP | 1 refills | Status: AC

## 2017-12-23 ENCOUNTER — Other Ambulatory Visit: Payer: Self-pay

## 2017-12-23 ENCOUNTER — Emergency Department
Admission: EM | Admit: 2017-12-23 | Discharge: 2017-12-23 | Disposition: A | Discharge status: Home / Self Care | Payer: No Typology Code available for payment source | Attending: Student in an Organized Health Care Education/Training Program | Admitting: Student in an Organized Health Care Education/Training Program

## 2017-12-23 ENCOUNTER — Emergency Department: Payer: No Typology Code available for payment source

## 2017-12-23 ENCOUNTER — Encounter: Payer: Self-pay | Admitting: *Deleted

## 2017-12-23 DIAGNOSIS — Z7984 Long term (current) use of oral hypoglycemic drugs: Secondary | ICD-10-CM | POA: Diagnosis not present

## 2017-12-23 DIAGNOSIS — Y92410 Unspecified street and highway as the place of occurrence of the external cause: Secondary | ICD-10-CM | POA: Insufficient documentation

## 2017-12-23 DIAGNOSIS — Y9389 Activity, other specified: Secondary | ICD-10-CM | POA: Insufficient documentation

## 2017-12-23 DIAGNOSIS — S0003XA Contusion of scalp, initial encounter: Secondary | ICD-10-CM

## 2017-12-23 DIAGNOSIS — Z7982 Long term (current) use of aspirin: Secondary | ICD-10-CM | POA: Insufficient documentation

## 2017-12-23 DIAGNOSIS — Z85048 Personal history of other malignant neoplasm of rectum, rectosigmoid junction, and anus: Secondary | ICD-10-CM | POA: Insufficient documentation

## 2017-12-23 DIAGNOSIS — S0000XA Unspecified superficial injury of scalp, initial encounter: Secondary | ICD-10-CM | POA: Diagnosis present

## 2017-12-23 DIAGNOSIS — I129 Hypertensive chronic kidney disease with stage 1 through stage 4 chronic kidney disease, or unspecified chronic kidney disease: Secondary | ICD-10-CM | POA: Diagnosis not present

## 2017-12-23 DIAGNOSIS — J45909 Unspecified asthma, uncomplicated: Secondary | ICD-10-CM | POA: Insufficient documentation

## 2017-12-23 DIAGNOSIS — N181 Chronic kidney disease, stage 1: Secondary | ICD-10-CM | POA: Insufficient documentation

## 2017-12-23 DIAGNOSIS — Y999 Unspecified external cause status: Secondary | ICD-10-CM | POA: Insufficient documentation

## 2017-12-23 DIAGNOSIS — Z79899 Other long term (current) drug therapy: Secondary | ICD-10-CM | POA: Diagnosis not present

## 2017-12-23 DIAGNOSIS — E1122 Type 2 diabetes mellitus with diabetic chronic kidney disease: Secondary | ICD-10-CM | POA: Diagnosis not present

## 2017-12-23 DIAGNOSIS — R51 Headache: Secondary | ICD-10-CM | POA: Diagnosis not present

## 2017-12-23 MED ORDER — BUTALBITAL-APAP-CAFFEINE 50-325-40 MG PO TABS: 1.0000 | ORAL_TABLET | Freq: Four times a day (QID) | ORAL | 0 refills | Status: DC | PRN

## 2017-12-23 MED ORDER — BUTALBITAL-APAP-CAFFEINE 50-325-40 MG PO TABS
1.0000 | ORAL_TABLET | ORAL | Status: DC | PRN
  Administered 2017-12-23: 1 via ORAL
  Filled 2017-12-23: qty 1
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
## 2017-12-23 NOTE — ED Notes (Signed)
PT and family updated and pt eating at this time. NAD.

## 2017-12-23 NOTE — Discharge Instructions (Signed)

## 2018-01-12 ENCOUNTER — Ambulatory Visit
Admission: RE | Admit: 2018-01-12 | Discharge: 2018-01-12 | Disposition: A | Discharge status: Home / Self Care | Payer: Medicare Other | Source: Ambulatory Visit | Attending: Obstetrics & Gynecology | Admitting: Obstetrics & Gynecology

## 2018-01-12 DIAGNOSIS — Z1231 Encounter for screening mammogram for malignant neoplasm of breast: Secondary | ICD-10-CM | POA: Diagnosis not present

## 2018-01-13 ENCOUNTER — Encounter: Payer: Self-pay | Admitting: Obstetrics & Gynecology

## 2018-01-26 ENCOUNTER — Other Ambulatory Visit: Payer: Self-pay

## 2018-01-26 ENCOUNTER — Encounter: Payer: Self-pay | Admitting: Hematology and Oncology

## 2018-01-26 ENCOUNTER — Inpatient Hospital Stay: Payer: Medicare Other | Attending: Hematology and Oncology

## 2018-01-26 ENCOUNTER — Inpatient Hospital Stay (HOSPITAL_BASED_OUTPATIENT_CLINIC_OR_DEPARTMENT_OTHER): Payer: Medicare Other | Admitting: Hematology and Oncology

## 2018-01-26 VITALS — BP 172/89 | HR 80 | Temp 98.3°F | Resp 18 | Wt 114.2 lb

## 2018-01-26 DIAGNOSIS — D696 Thrombocytopenia, unspecified: Secondary | ICD-10-CM

## 2018-01-26 DIAGNOSIS — I129 Hypertensive chronic kidney disease with stage 1 through stage 4 chronic kidney disease, or unspecified chronic kidney disease: Secondary | ICD-10-CM

## 2018-01-26 DIAGNOSIS — D693 Immune thrombocytopenic purpura: Secondary | ICD-10-CM | POA: Diagnosis not present

## 2018-01-26 DIAGNOSIS — N189 Chronic kidney disease, unspecified: Secondary | ICD-10-CM | POA: Insufficient documentation

## 2018-01-26 LAB — COMPREHENSIVE METABOLIC PANEL
ALT: 26 U/L (ref 14–54)
AST: 31 U/L (ref 15–41)
Albumin: 3.9 g/dL (ref 3.5–5.0)
Alkaline Phosphatase: 97 U/L (ref 38–126)
Anion gap: 9 (ref 5–15)
BUN: 18 mg/dL (ref 6–20)
CO2: 28 mmol/L (ref 22–32)
Calcium: 9.8 mg/dL (ref 8.9–10.3)
Chloride: 96 mmol/L — ABNORMAL LOW (ref 101–111)
Creatinine, Ser: 0.58 mg/dL (ref 0.44–1.00)
GFR calc Af Amer: >60 mL/min (ref >60)
GFR calc non Af Amer: >60 mL/min (ref >60)
Glucose, Bld: 128 mg/dL — ABNORMAL HIGH (ref 65–99)
Potassium: 4.1 mmol/L (ref 3.5–5.1)
Sodium: 133 mmol/L — ABNORMAL LOW (ref 135–145)
Total Bilirubin: 0.7 mg/dL (ref 0.3–1.2)
Total Protein: 8.3 g/dL — ABNORMAL HIGH (ref 6.5–8.1)

## 2018-01-26 LAB — CBC WITH DIFFERENTIAL/PLATELET
Basophils Absolute: 0.1 10*3/uL (ref 0–0.1)
Basophils Relative: 2 %
Eosinophils Absolute: 1.1 10*3/uL — ABNORMAL HIGH (ref 0–0.7)
Eosinophils Relative: 18 %
HCT: 35.2 % (ref 35.0–47.0)
Hemoglobin: 11.8 g/dL — ABNORMAL LOW (ref 12.0–16.0)
Lymphocytes Relative: 15 %
Lymphs Abs: 0.9 10*3/uL — ABNORMAL LOW (ref 1.0–3.6)
MCH: 29.8 pg (ref 26.0–34.0)
MCHC: 33.5 g/dL (ref 32.0–36.0)
MCV: 89.1 fL (ref 80.0–100.0)
Monocytes Absolute: 1.2 10*3/uL — ABNORMAL HIGH (ref 0.2–0.9)
Monocytes Relative: 19 %
Neutro Abs: 2.9 10*3/uL (ref 1.4–6.5)
Neutrophils Relative %: 46 %
Platelets: 128 10*3/uL — ABNORMAL LOW (ref 150–400)
RBC: 3.96 MIL/uL (ref 3.80–5.20)
RDW: 16 % — ABNORMAL HIGH (ref 11.5–14.5)
WBC: 6.2 10*3/uL (ref 3.6–11.0)
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
## 2018-01-26 NOTE — Progress Notes (Signed)
Patient here for follow up. No concerns voiced.  °

## 2018-01-28 ENCOUNTER — Other Ambulatory Visit: Payer: Self-pay | Admitting: Family Medicine

## 2018-01-28 DIAGNOSIS — I10 Essential (primary) hypertension: Secondary | ICD-10-CM

## 2018-01-28 DIAGNOSIS — R809 Proteinuria, unspecified: Secondary | ICD-10-CM

## 2018-02-04 ENCOUNTER — Ambulatory Visit (INDEPENDENT_AMBULATORY_CARE_PROVIDER_SITE_OTHER): Payer: Medicare Other

## 2018-02-04 VITALS — BP 124/60 | HR 86 | Temp 98.4°F | Resp 12 | Ht 65.0 in | Wt 116.1 lb

## 2018-02-04 DIAGNOSIS — Z0000 Encounter for general adult medical examination without abnormal findings: Secondary | ICD-10-CM | POA: Diagnosis not present

## 2018-02-04 NOTE — Progress Notes (Signed)
Subjective:   Julie Holland is a 77 y.o. female who presents for Medicare Annual (Subsequent) preventive examination.  Review of Systems:  N/A Cardiac Risk Factors include: advanced age (>108men, >73 women);diabetes mellitus;hypertension;dyslipidemia;sedentary lifestyle     Objective:     Vitals: BP 124/60 (BP Location: Left Arm, Patient Position: Sitting, Cuff Size: Normal)   Pulse 86   Temp 98.4 F (36.9 C) (Oral)   Resp 12   Ht 5\' 5"  (1.651 m)   Wt 116 lb 1.6 oz (52.7 kg)   SpO2 94%   BMI 19.32 kg/m   Body mass index is 19.32 kg/m.  Advanced Directives 02/04/2018 01/26/2018 10/27/2017 07/27/2017 04/30/2017 04/23/2017 03/16/2017  Does Patient Have a Medical Advance Directive? No No No No No No No  Would patient like information on creating a medical advance directive? Yes (MAU/Ambulatory/Procedural Areas - Information given) - - - - - -    Tobacco Social History   Tobacco Use  Smoking Status Never Smoker  Smokeless Tobacco Never Used  Tobacco Comment   smoking cessation materials not required     Counseling given: No Comment: smoking cessation materials not required  Clinical Intake:  Pre-visit preparation completed: Yes  Pain : No/denies pain   BMI - recorded: 19.32 Nutritional Status: BMI <19  Underweight Nutritional Risks: None  Nutrition Risk Assessment: Has the patient had any N/V/D within the last 2 months?  No Does the patient have any non-healing wounds?  No Has the patient had any unintentional weight loss or weight gain?  No  Is the patient diabetic?  Yes If diabetic, was a CBG obtained today?  No Did the patient bring in their glucometer from home?  No Comments: Pt monitors CBG's daily and at times BID. Denies any financial strains with the device or supplies.  Diabetic Exams: Diabetic Eye Exam: Completed 12/11/17 Diabetic Foot Exam: Completed 12/10/17  How often do you need to have someone help you when you read instructions, pamphlets, or other  written materials from your doctor or pharmacy?: 1 - Never  Interpreter Needed?: No  Information entered by :: Idell Pickles, LPN  Past Medical History:  Diagnosis Date  . Arthritis   . Asthma   . 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  . Cataract   . Chronic kidney disease    stage 1  . Diabetes mellitus    type II  . Dry eye of right side   . GERD (gastroesophageal reflux disease)    OCC  . Glaucoma 2018   RIGHT EYE   . Hemorrhoids   . Hyperlipidemia   . Hypertension    Past Surgical History:  Procedure Laterality Date  . CATARACT EXTRACTION, BILATERAL    . CHOLECYSTECTOMY    . COLONOSCOPY  02/13/13   Dr Bary Castilla  . EPIGASTRIC HERNIA REPAIR N/A 03/20/2015   Procedure: HERNIA REPAIR EPIGASTRIC ADULT;  Surgeon: Robert Bellow, MD;  Location: [facility] ORS;  Service: General;  Laterality: N/A;  . HEMORRHOID SURGERY N/A 03/20/2015    FOCAL HIGH-GRADE SQUAMOUS INTRAEPITHELIAL LESION (HSIL, ANAL /HEMORRHOIDECTOMY;   Robert Bellow, MD [facility] ORS;  : General;  Laterality: N/A;  . HERNIA REPAIR  July 2016   Epigastric hernia, primary repair  . TONSILLECTOMY  age 73  . TOTAL ABDOMINAL HYSTERECTOMY  01/1989  . TUBAL LIGATION    . 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  Case has been presented at the [facility] tumor board. No indication for additional treatment outside of serial exams  Surgeon: Robert Bellow, MD;  Location: [facility] ORS;  Service: General;  Laterality: N/A;   Family History  Problem Relation Age of Onset  . Bladder Cancer Mother   . Colon cancer Father   . Lung cancer Brother   . Lymphoma Sister   . Breast cancer Neg Hx    Social History   Socioeconomic History  . Marital status: Married    Spouse name: [name]  . Number of children: 3  . Years of education: Not on file  . Highest education level: 12th grade    Occupational History  . Occupation: Retired  Scientific laboratory technician  . Financial resource strain: Not hard at all  . Food insecurity:    Worry: Never true    Inability: Never true  . Transportation needs:    Medical: No    Non-medical: No  Tobacco Use  . Smoking status: Never Smoker  . Smokeless tobacco: Never Used  . Tobacco comment: smoking cessation materials not required  Substance and Sexual Activity  . Alcohol use: No  . Drug use: No  . Sexual activity: Not Currently  Lifestyle  . Physical activity:    Days per week: 0 days    Minutes per session: 0 min  . Stress: Not at all  Relationships  . Social connections:    Talks on phone: Patient refused    Gets together: Patient refused    Attends religious service: Patient refused    Active member of club or organization: Patient refused    Attends meetings of clubs or organizations: Patient refused    Relationship status: Married  Other Topics Concern  . Not on file  Social History Narrative  . Not on file    Outpatient Encounter Medications as of 02/04/2018  Medication Sig  . ACCU-CHEK SMARTVIEW test strip USE AS INSTRUCTED TO CHECK  BLOOD GLUCOSE ONCE DAILY if desired; LON 99 months; Dx E11.9  . acetaminophen (TYLENOL) 500 MG tablet Take 500-1,000 mg by mouth every 6 (six) hours as needed (for pain).  . ADVAIR DISKUS 100-50 MCG/DOSE AEPB USE 1 PUFF TWO TIMES DAILY  . albuterol (PROVENTIL HFA;VENTOLIN HFA) 108 (90 Base) MCG/ACT inhaler Inhale 2 puffs into the lungs every 6 (six) hours as needed for wheezing or shortness of breath.  Marland Kitchen aspirin 81 MG chewable tablet Chew 81 mg by mouth daily.  . Carboxymeth-Glycerin-Polysorb (REFRESH OPTIVE ADVANCED OP) Place 1 drop into both eyes 3 (three) times daily as needed (for dry/irritated eyes).  . carvedilol (COREG) 6.25 MG tablet Take 1 tablet (6.25 mg total) by mouth 2 (two) times daily with a meal.  . latanoprost (XALATAN) 0.005 % ophthalmic solution Place 1 drop at bedtime into the  right eye.  Marland Kitchen lisinopril (PRINIVIL,ZESTRIL) 10 MG tablet Take 1 tablet (10 mg total) by mouth daily.  . metFORMIN (GLUCOPHAGE) 850 MG tablet Take 1 tablet (850 mg total) by mouth 2 (two) times daily with a meal.  . Multiple Vitamin (MULTIVITAMIN) tablet Take 2 tablets by mouth daily.   . Nutritional Supplements (ESTROVEN PO) Take 1 tablet by mouth daily.  . simvastatin (ZOCOR) 20 MG tablet TAKE 1 TABLET BY MOUTH AT  BEDTIME  . butalbital-acetaminophen-caffeine (FIORICET, ESGIC) 50-325-40 MG tablet Take 1-2 tablets by mouth every 6 (six) hours as needed for headache.   No facility-administered encounter medications on file as of  02/04/2018.     Activities of Daily Living In your present state of health, do you have any difficulty performing the following activities: 02/04/2018 12/10/2017  Hearing? N N  Comment denies hearing aids -  Vision? N N  Comment wears reading glasses; glaucoma -  Difficulty concentrating or making decisions? N N  Walking or climbing stairs? N N  Dressing or bathing? N N  Doing errands, shopping? N N  Preparing Food and eating ? N -  Comment upper partial dentures -  Using the Toilet? N -  In the past six months, have you accidently leaked urine? N -  Do you have problems with loss of bowel control? N -  Managing your Medications? N -  Managing your Finances? N -  Housekeeping or managing your Housekeeping? N -  Some recent data might be hidden    Patient Care Team: Lada, Satira Anis, MD as PCP - General (Family Medicine) Bary Castilla, Forest Gleason, MD as Consulting Physician (General Surgery) Agapito Games as Consulting Physician (Optometry) Gae Dry, MD as Referring Physician (Obstetrics and Gynecology) Lequita Asal, MD as Referring Physician (Hematology and Oncology) Murlean Iba, MD (Internal Medicine)    Assessment:   This is a routine wellness examination for Juliana.  Exercise Activities and Dietary recommendations Current Exercise  Habits: The patient does not participate in regular exercise at present, Exercise limited by: None identified  Goals    . DIET - INCREASE WATER INTAKE     Recommend to drink at least 6-8 8oz glasses of water per day.       Fall Risk Fall Risk  02/04/2018 12/10/2017 09/11/2017 06/11/2017 03/09/2017  Falls in the past year? No No No No Yes  Number falls in past yr: - - - - 1  Comment - - - - -  Injury with Fall? - - - - No  Risk for fall due to : Impaired vision;History of fall(s);Impaired balance/gait - - - -  Risk for fall due to: Comment wears reading glasses; glaucoma; ambulates with cane - - - -  Follow up - - - - -   [place]: Is your home free of loose throw rugs in walkways, pet beds, electrical cords, etc? Yes Is there adequate lighting in your home to reduce risk of falls?  Yes Are there stairs in or around your home WITH handrails? Yes  ASSISTIVE DEVICES UTILIZED TO PREVENT FALLS: Use of a cane, walker or w/c? Yes, use of cane Grab bars in the bathroom? No  Shower chair or a place to sit while bathing? No An elevated toilet seat or a handicapped toilet? No  Timed Get Up and Go Performed: Yes. Pt ambulated 10 feet within 20 sec. Gait slow, steady and without the use of an assistive device. No intervention required at this time. Fall risk prevention has been discussed.  Community Resource Referral:  Pt declined my offer to send [employer] Referral to Care Guide for installation of grab bars in the shower, shower chair or an elevated toilet seat.  Depression Screen PHQ 2/9 Scores 02/04/2018 12/10/2017 09/11/2017 06/11/2017  PHQ - 2 Score 0 0 0 0  PHQ- 9 Score 0 - - -     Cognitive Function     6CIT Screen 02/04/2018 12/01/2016  What Year? 0 points 0 points  What month? 0 points 0 points  What time? 0 points 0 points  Count back from 20 0 points 0 points  Months  in reverse 0 points 0 points  Repeat phrase 4 points 4 points  Total Score 4  4    Immunization History  Administered Date(s) Administered  . Influenza Split 07/29/2011, 07/13/2012  . Influenza, High Dose Seasonal PF 07/20/2013, 06/27/2015, 05/27/2016, 06/11/2017  . Pneumococcal Conjugate-13 12/10/2017  . Pneumococcal Polysaccharide-23 07/21/2011  . Tdap 10/16/2010, 03/08/2017    Qualifies for Shingles Vaccine? Yes. Due for Shingrix. Education has been provided regarding the importance of this vaccine. Pt has been advised to call her insurance company to determine her out of pocket expense. Advised she may also receive this vaccine at her local pharmacy or Health Dept. Verbalized acceptance and understanding.  Screening Tests Health Maintenance  Topic Date Due  . HEMOGLOBIN A1C  03/11/2018  . INFLUENZA VACCINE  04/08/2018  . FOOT EXAM  12/11/2018  . OPHTHALMOLOGY EXAM  12/12/2018  . TETANUS/TDAP  03/09/2027  . DEXA SCAN  Completed  . PNA vac Low Risk Adult  Completed    Cancer Screenings: Lung: Low Dose CT Chest recommended if Age 15-80 years, 30 pack-year currently smoking OR have quit w/in 15years. Patient does not qualify. Breast Screening: No longer required   Bone Density/Dexa: No longer required Colorectal: No longer required  Additional Screenings: Hepatitis C Screening: Does not qualify    Plan:  I have personally reviewed and addressed the Medicare Annual Wellness questionnaire and have noted the following in the patient's chart:  A. Medical and social history B. Use of alcohol, tobacco or illicit drugs  C. Current medications and supplements D. Functional ability and status E.  Nutritional status F.  Physical activity G. Advance directives H. List of other physicians I.  Hospitalizations, surgeries, and ER visits in previous 12 months J.  [place] such as hearing and vision if needed, cognitive and depression L. Referrals and appointments  In addition, I have reviewed and discussed with patient certain preventive  protocols, quality metrics, and best practice recommendations. A written personalized care plan for preventive services as well as general preventive health recommendations were provided to patient.  See attached scanned questionnaire for additional information.   Signed,  Aleatha Borer, LPN Nurse Health Advisor

## 2018-02-04 NOTE — Patient Instructions (Signed)
Julie Holland , Thank you for taking time to come for your Medicare Wellness Visit. I appreciate your ongoing commitment to your health goals. Please review the following plan we discussed and let me know if I can assist you in the future.   Screening recommendations/referrals: Colorectal Screening: No longer required Mammogram: No longer required Bone Density: No longer required Lung Cancer Screening: You do not qualify for this screening Hepatitis C Screening: You do not qualify for this screening  Vision and Dental Exams: Recommended annual ophthalmology exams for early detection of glaucoma and other disorders of the eye Recommended annual dental exams for proper oral hygiene  Diabetic Exams: Recommended annual diabetic eye exams for early detection of retinopathy Recommended annual diabetic foot exams for early detection of peripheral neuropathy.  Diabetic Eye Exam: Up to date Diabetic Foot Exam: Up to date  Vaccinations: Influenza vaccine: Up to date Pneumococcal vaccine: Up to date Tdap vaccine: Up to date Shingles vaccine: Please call your insurance company to determine your out of pocket expense for the Shingrix vaccine. You may also receive this vaccine at your local pharmacy or Health Dept.   Advanced directives: Advance directive discussed with you today. I have provided a copy for you to complete at home and have notarized. Once this is complete please bring a copy in to our office so we can scan it into your chart.  Conditions/risks identified: Recommend to drink at least 6-8 8oz glasses of water per day.  Next appointment: Please schedule your Annual Wellness Visit with your Nurse Health Advisor in one year.  Preventive Care 22 Years and Older, Female Preventive care refers to lifestyle choices and visits with your health care provider that can promote health and wellness. What does preventive care include?  A yearly physical exam. This is also called an annual well  check.  Dental exams once or twice a year.  Routine eye exams. Ask your health care provider how often you should have your eyes checked.  Personal lifestyle choices, including:  Daily care of your teeth and gums.  Regular physical activity.  Eating a healthy diet.  Avoiding tobacco and drug use.  Limiting alcohol use.  Practicing safe sex.  Taking low-dose aspirin every day.  Taking vitamin and mineral supplements as recommended by your health care provider. What happens during an annual well check? The services and screenings done by your health care provider during your annual well check will depend on your age, overall health, lifestyle risk factors, and family history of disease. Counseling  Your health care provider may ask you questions about your:  Alcohol use.  Tobacco use.  Drug use.  Emotional well-being.  Home and relationship well-being.  Sexual activity.  Eating habits.  History of falls.  Memory and ability to understand (cognition).  Work and work Statistician.  Reproductive health. Screening  You may have the following tests or measurements:  Height, weight, and BMI.  Blood pressure.  Lipid and cholesterol levels. These may be checked every 5 years, or more frequently if you are over 77 years old.  Skin check.  Lung cancer screening. You may have this screening every year starting at age 33 if you have a 30-pack-year history of smoking and currently smoke or have quit within the past 15 years.  Fecal occult blood test (FOBT) of the stool. You may have this test every year starting at age 78.  Flexible sigmoidoscopy or colonoscopy. You may have a sigmoidoscopy every 5 years or a colonoscopy  every 10 years starting at age 11.  Hepatitis C blood test.  Hepatitis B blood test.  Sexually transmitted disease (STD) testing.  Diabetes screening. This is done by checking your blood sugar (glucose) after you have not eaten for a while  (fasting). You may have this done every 1-3 years.  Bone density scan. This is done to screen for osteoporosis. You may have this done starting at age 24.  Mammogram. This may be done every 1-2 years. Talk to your health care provider about how often you should have regular mammograms. Talk with your health care provider about your test results, treatment options, and if necessary, the need for more tests. Vaccines  Your health care provider may recommend certain vaccines, such as:  Influenza vaccine. This is recommended every year.  Tetanus, diphtheria, and acellular pertussis (Tdap, Td) vaccine. You may need a Td booster every 10 years.  Zoster vaccine. You may need this after age 13.  Pneumococcal 13-valent conjugate (PCV13) vaccine. One dose is recommended after age 28.  Pneumococcal polysaccharide (PPSV23) vaccine. One dose is recommended after age 58. Talk to your health care provider about which screenings and vaccines you need and how often you need them. This information is not intended to replace advice given to you by your health care provider. Make sure you discuss any questions you have with your health care provider. Document Released: 09/21/2015 Document Revised: 05/14/2016 Document Reviewed: 06/26/2015 Elsevier Interactive Patient Education  2017 [place] Prevention in the Home Falls can cause injuries. They can happen to people of all ages. There are many things you can do to make your home safe and to help prevent falls. What can I do on the outside of my home?  Regularly fix the edges of walkways and driveways and fix any cracks.  Remove anything that might make you trip as you walk through a door, such as a raised step or threshold.  Trim any bushes or trees on the path to your home.  Use bright outdoor lighting.  Clear any walking paths of anything that might make someone trip, such as rocks or tools.  Regularly check to see if handrails are loose  or broken. Make sure that both sides of any steps have handrails.  Any raised decks and porches should have guardrails on the edges.  Have any leaves, snow, or ice cleared regularly.  Use sand or salt on walking paths during winter.  Clean up any spills in your garage right away. This includes oil or grease spills. What can I do in the bathroom?  Use night lights.  Install grab bars by the toilet and in the tub and shower. Do not use towel bars as grab bars.  Use non-skid mats or decals in the tub or shower.  If you need to sit down in the shower, use a plastic, non-slip stool.  Keep the floor dry. Clean up any water that spills on the floor as soon as it happens.  Remove soap buildup in the tub or shower regularly.  Attach bath mats securely with double-sided non-slip rug tape.  Do not have throw rugs and other things on the floor that can make you trip. What can I do in the bedroom?  Use night lights.  Make sure that you have a light by your bed that is easy to reach.  Do not use any sheets or blankets that are too big for your bed. They should not hang down onto the floor.  Have a firm chair that has side arms. You can use this for support while you get dressed.  Do not have throw rugs and other things on the floor that can make you trip. What can I do in the kitchen?  Clean up any spills right away.  Avoid walking on wet floors.  Keep items that you use a lot in easy-to-reach places.  If you need to reach something above you, use a strong step stool that has a grab bar.  Keep electrical cords out of the way.  Do not use floor polish or wax that makes floors slippery. If you must use wax, use non-skid floor wax.  Do not have throw rugs and other things on the floor that can make you trip. What can I do with my stairs?  Do not leave any items on the stairs.  Make sure that there are handrails on both sides of the stairs and use them. Fix handrails that are  broken or loose. Make sure that handrails are as long as the stairways.  Check any carpeting to make sure that it is firmly attached to the stairs. Fix any carpet that is loose or worn.  Avoid having throw rugs at the top or bottom of the stairs. If you do have throw rugs, attach them to the floor with carpet tape.  Make sure that you have a light switch at the top of the stairs and the bottom of the stairs. If you do not have them, ask someone to add them for you. What else can I do to help prevent falls?  Wear shoes that:  Do not have high heels.  Have rubber bottoms.  Are comfortable and fit you well.  Are closed at the toe. Do not wear sandals.  If you use a stepladder:  Make sure that it is fully opened. Do not climb a closed stepladder.  Make sure that both sides of the stepladder are locked into place.  Ask someone to hold it for you, if possible.  Clearly mark and make sure that you can see:  Any grab bars or handrails.  First and last steps.  Where the edge of each step is.  Use tools that help you move around (mobility aids) if they are needed. These include:  Canes.  Walkers.  Scooters.  Crutches.  Turn on the lights when you go into a dark area. Replace any light bulbs as soon as they burn out.  Set up your furniture so you have a clear path. Avoid moving your furniture around.  If any of your floors are uneven, fix them.  If there are any pets around you, be aware of where they are.  Review your medicines with your doctor. Some medicines can make you feel dizzy. This can increase your chance of falling. Ask your doctor what other things that you can do to help prevent falls. This information is not intended to replace advice given to you by your health care provider. Make sure you discuss any questions you have with your health care provider. Document Released: 06/21/2009 Document Revised: 01/31/2016 Document Reviewed: 09/29/2014 Elsevier  Interactive Patient Education  2017 [employer].

## 2018-02-07 ENCOUNTER — Emergency Department: Payer: Medicare Other

## 2018-02-07 ENCOUNTER — Emergency Department
Admission: EM | Admit: 2018-02-07 | Discharge: 2018-02-07 | Disposition: A | Discharge status: Home / Self Care | Payer: Medicare Other | Source: Home / Self Care | Attending: Emergency Medicine | Admitting: Emergency Medicine

## 2018-02-07 ENCOUNTER — Encounter: Payer: Self-pay | Admitting: Emergency Medicine

## 2018-02-07 DIAGNOSIS — E119 Type 2 diabetes mellitus without complications: Secondary | ICD-10-CM | POA: Insufficient documentation

## 2018-02-07 DIAGNOSIS — Z8052 Family history of malignant neoplasm of bladder: Secondary | ICD-10-CM | POA: Diagnosis not present

## 2018-02-07 DIAGNOSIS — Z807 Family history of other malignant neoplasms of lymphoid, hematopoietic and related tissues: Secondary | ICD-10-CM | POA: Diagnosis not present

## 2018-02-07 DIAGNOSIS — J45909 Unspecified asthma, uncomplicated: Secondary | ICD-10-CM | POA: Insufficient documentation

## 2018-02-07 DIAGNOSIS — M25431 Effusion, right wrist: Secondary | ICD-10-CM | POA: Diagnosis not present

## 2018-02-07 DIAGNOSIS — W19XXXA Unspecified fall, initial encounter: Secondary | ICD-10-CM

## 2018-02-07 DIAGNOSIS — A4101 Sepsis due to Methicillin susceptible Staphylococcus aureus: Secondary | ICD-10-CM | POA: Diagnosis not present

## 2018-02-07 DIAGNOSIS — E872 Acidosis: Secondary | ICD-10-CM | POA: Diagnosis not present

## 2018-02-07 DIAGNOSIS — L02413 Cutaneous abscess of right upper limb: Secondary | ICD-10-CM | POA: Diagnosis not present

## 2018-02-07 DIAGNOSIS — A419 Sepsis, unspecified organism: Secondary | ICD-10-CM | POA: Diagnosis not present

## 2018-02-07 DIAGNOSIS — I4510 Unspecified right bundle-branch block: Secondary | ICD-10-CM

## 2018-02-07 DIAGNOSIS — Z85828 Personal history of other malignant neoplasm of skin: Secondary | ICD-10-CM | POA: Diagnosis not present

## 2018-02-07 DIAGNOSIS — M25531 Pain in right wrist: Secondary | ICD-10-CM

## 2018-02-07 DIAGNOSIS — I129 Hypertensive chronic kidney disease with stage 1 through stage 4 chronic kidney disease, or unspecified chronic kidney disease: Secondary | ICD-10-CM | POA: Diagnosis not present

## 2018-02-07 DIAGNOSIS — R748 Abnormal levels of other serum enzymes: Secondary | ICD-10-CM | POA: Diagnosis not present

## 2018-02-07 DIAGNOSIS — Z801 Family history of malignant neoplasm of trachea, bronchus and lung: Secondary | ICD-10-CM | POA: Diagnosis not present

## 2018-02-07 DIAGNOSIS — N181 Chronic kidney disease, stage 1: Secondary | ICD-10-CM | POA: Insufficient documentation

## 2018-02-07 DIAGNOSIS — M79641 Pain in right hand: Secondary | ICD-10-CM | POA: Diagnosis not present

## 2018-02-07 DIAGNOSIS — H409 Unspecified glaucoma: Secondary | ICD-10-CM | POA: Diagnosis not present

## 2018-02-07 DIAGNOSIS — Z7984 Long term (current) use of oral hypoglycemic drugs: Secondary | ICD-10-CM

## 2018-02-07 DIAGNOSIS — K219 Gastro-esophageal reflux disease without esophagitis: Secondary | ICD-10-CM | POA: Diagnosis not present

## 2018-02-07 DIAGNOSIS — Z800 Family history of malignant neoplasm of digestive organs: Secondary | ICD-10-CM | POA: Diagnosis not present

## 2018-02-07 DIAGNOSIS — Z7982 Long term (current) use of aspirin: Secondary | ICD-10-CM | POA: Insufficient documentation

## 2018-02-07 DIAGNOSIS — Z79899 Other long term (current) drug therapy: Secondary | ICD-10-CM | POA: Insufficient documentation

## 2018-02-07 DIAGNOSIS — M7989 Other specified soft tissue disorders: Secondary | ICD-10-CM | POA: Diagnosis not present

## 2018-02-07 DIAGNOSIS — L7622 Postprocedural hemorrhage and hematoma of skin and subcutaneous tissue following other procedure: Secondary | ICD-10-CM | POA: Diagnosis not present

## 2018-02-07 DIAGNOSIS — R197 Diarrhea, unspecified: Secondary | ICD-10-CM | POA: Diagnosis not present

## 2018-02-07 DIAGNOSIS — Z9841 Cataract extraction status, right eye: Secondary | ICD-10-CM | POA: Diagnosis not present

## 2018-02-07 DIAGNOSIS — E860 Dehydration: Secondary | ICD-10-CM | POA: Diagnosis not present

## 2018-02-07 DIAGNOSIS — E871 Hypo-osmolality and hyponatremia: Secondary | ICD-10-CM | POA: Diagnosis not present

## 2018-02-07 DIAGNOSIS — S6991XA Unspecified injury of right wrist, hand and finger(s), initial encounter: Secondary | ICD-10-CM | POA: Diagnosis not present

## 2018-02-07 DIAGNOSIS — I1310 Hypertensive heart and chronic kidney disease without heart failure, with stage 1 through stage 4 chronic kidney disease, or unspecified chronic kidney disease: Secondary | ICD-10-CM

## 2018-02-07 DIAGNOSIS — D6959 Other secondary thrombocytopenia: Secondary | ICD-10-CM | POA: Diagnosis not present

## 2018-02-07 DIAGNOSIS — M71041 Abscess of bursa, right hand: Secondary | ICD-10-CM | POA: Diagnosis not present

## 2018-02-07 DIAGNOSIS — L03113 Cellulitis of right upper limb: Secondary | ICD-10-CM | POA: Diagnosis not present

## 2018-02-07 DIAGNOSIS — E1122 Type 2 diabetes mellitus with diabetic chronic kidney disease: Secondary | ICD-10-CM | POA: Diagnosis not present

## 2018-02-07 DIAGNOSIS — L03119 Cellulitis of unspecified part of limb: Secondary | ICD-10-CM | POA: Diagnosis not present

## 2018-02-07 DIAGNOSIS — E785 Hyperlipidemia, unspecified: Secondary | ICD-10-CM | POA: Diagnosis not present

## 2018-02-07 DIAGNOSIS — L02511 Cutaneous abscess of right hand: Secondary | ICD-10-CM | POA: Diagnosis not present

## 2018-02-07 DIAGNOSIS — Z9842 Cataract extraction status, left eye: Secondary | ICD-10-CM | POA: Diagnosis not present

## 2018-02-07 DIAGNOSIS — E876 Hypokalemia: Secondary | ICD-10-CM | POA: Diagnosis not present

## 2018-02-07 DIAGNOSIS — D631 Anemia in chronic kidney disease: Secondary | ICD-10-CM | POA: Diagnosis not present

## 2018-02-07 MED ORDER — NAPROXEN 375 MG PO TBEC: 1.0000 | DELAYED_RELEASE_TABLET | Freq: Two times a day (BID) | ORAL | 0 refills | Status: DC | PRN

## 2018-02-07 NOTE — ED Triage Notes (Signed)
Patient presents to the ED with swollen right wrist and hand.  Patient states she fell in the yard on Thursday.  Patient states, "it hurts like hell."  Patient states she tripped.  No other complaints.  Patient denies hitting her head when she fell and denies losing consciousness.

## 2018-02-07 NOTE — Discharge Instructions (Addendum)
You have been diagnosed with right wrist pain.  Your x-ray was negative for fracture.  We have wrapped her wrist with an Ace wrap.  Rx provided for naproxen 375 mg 2 times a day as needed for pain.  Encouraged ice and elevation at home to reduce swelling.

## 2018-02-07 NOTE — ED Notes (Signed)
See triage note  Presents with pain to right thumb and wrist area   States she took a fall on Thursday  Good pulses

## 2018-02-07 NOTE — ED Provider Notes (Signed)
[facility] Emergency Department Provider Note ____________________________________________  Time seen: 1035  I have reviewed the triage vital signs and the nursing notes.  HISTORY  Chief Complaint  Wrist Pain   HPI Julie Holland is a 77 y.o. female resents to the ER today with complaint of right wrist pain and swelling.  She reports this started 4 days after she tripped and fell and landed on her right hand.  She denies numbness, tingling or weakness.  She does have a history of known arthritis in her hands.  She has tried [employer] cream and ice with minimal relief.  Past Medical History:  Diagnosis Date  . Arthritis   . Asthma   . 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  . Cataract   . Chronic kidney disease    stage 1  . Diabetes mellitus    type II  . Dry eye of right side   . GERD (gastroesophageal reflux disease)    OCC  . Glaucoma 2018   RIGHT EYE   . Hemorrhoids   . Hyperlipidemia   . Hypertension     Patient Active Problem List   Diagnosis Date Noted  . Need for vaccination with 13-polyvalent pneumococcal conjugate vaccine 12/10/2017  . Urine test positive for microalbuminuria 12/10/2017  . Anemia 05/02/2017  . Anemia associated with acute blood loss 03/09/2017  . Arthritis of left knee 12/10/2016  . Neutropenia ([place]) 10/20/2016  . Thrombocytopenia ([place]) 10/20/2016  . Postoperative hemorrhage involving digestive system following digestive system procedure 08/04/2016  . Rectal bleeding 07/31/2016  . Cancer of anal canal ([place]) 07/29/2016  . 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 Asthma 08/17/2015  . Umbilical hernia without obstruction and without gangrene 02/27/2015  . 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 Routine general medical examination at a health care facility 03/15/2013  . 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 Shoulder pain 11/23/2012  . Right  bundle branch block 11/11/2012  . Diabetes mellitus, controlled ([place])   . Hyperlipidemia   . Hypertension     Past Surgical History:  Procedure Laterality Date  . CATARACT EXTRACTION, BILATERAL    . CHOLECYSTECTOMY    . COLONOSCOPY  02/13/13   Dr Bary Castilla  . EPIGASTRIC HERNIA REPAIR N/A 03/20/2015   Procedure: HERNIA REPAIR EPIGASTRIC ADULT;  Surgeon: Robert Bellow, MD;  Location: [facility] ORS;  Service: General;  Laterality: N/A;  . HEMORRHOID SURGERY N/A 03/20/2015    FOCAL HIGH-GRADE SQUAMOUS INTRAEPITHELIAL LESION (HSIL, ANAL /HEMORRHOIDECTOMY;   Robert Bellow, MD [facility] ORS;  : General;  Laterality: N/A;  . HERNIA REPAIR  July 2016   Epigastric hernia, primary repair  . TONSILLECTOMY  age 84  . TOTAL ABDOMINAL HYSTERECTOMY  01/1989  . TUBAL LIGATION    . 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  Case has been presented at the [facility] tumor board. No indication for additional treatment outside of serial exams  Surgeon: Robert Bellow, MD;  Location: [facility] ORS;  Service: General;  Laterality: N/A;    Prior to Admission medications   Medication Sig Start Date End Date Taking? Authorizing Provider  ACCU-CHEK SMARTVIEW test strip USE AS INSTRUCTED TO CHECK  BLOOD GLUCOSE ONCE DAILY if desired; LON 99 months; Dx E11.9 12/17/17   [name], MD  acetaminophen (TYLENOL) 500 MG tablet Take 500-1,000  mg by mouth every 6 (six) hours as needed (for pain).    [provider]  ADVAIR DISKUS 100-50 MCG/DOSE AEPB USE 1 PUFF TWO TIMES DAILY 05/29/17   Roselee Nova, MD  albuterol (PROVENTIL HFA;VENTOLIN HFA) 108 (90 Base) MCG/ACT inhaler Inhale 2 puffs into the lungs every 6 (six) hours as needed for wheezing or shortness of breath. 10/02/17   Roselee Nova, MD  aspirin 81 MG chewable tablet Chew 81 mg by mouth daily.    [provider]  butalbital-acetaminophen-caffeine Emelda Brothers,  ESGIC) [phone] MG tablet Take 1-2 tablets by mouth every 6 (six) hours as needed for headache. 12/23/17 12/23/18  Merlyn Lot, MD  Carboxymeth-Glycerin-Polysorb (REFRESH OPTIVE ADVANCED OP) Place 1 drop into both eyes 3 (three) times daily as needed (for dry/irritated eyes).    [provider]  carvedilol (COREG) 6.25 MG tablet Take 1 tablet (6.25 mg total) by mouth 2 (two) times daily with a meal. 11/26/17   Lada, Satira Anis, MD  latanoprost (XALATAN) 0.005 % ophthalmic solution Place 1 drop at bedtime into the right eye.    [provider]  lisinopril (PRINIVIL,ZESTRIL) 10 MG tablet Take 1 tablet (10 mg total) by mouth daily. 12/10/17   [name], MD  metFORMIN (GLUCOPHAGE) 850 MG tablet Take 1 tablet (850 mg total) by mouth 2 (two) times daily with a meal. 10/08/17   Roselee Nova, MD  Multiple Vitamin (MULTIVITAMIN) tablet Take 2 tablets by mouth daily.     [provider]  Naproxen 375 MG TBEC Take 1 tablet (375 mg total) by mouth 2 (two) times daily as needed. 02/07/18   Jearld Fenton, NP  Nutritional Supplements (ESTROVEN PO) Take 1 tablet by mouth daily.    [provider]  simvastatin (ZOCOR) 20 MG tablet TAKE 1 TABLET BY MOUTH AT  BEDTIME 08/31/17   Roselee Nova, MD    Allergies Hydrocodone  Family History  Problem Relation Age of Onset  . Bladder Cancer Mother   . Colon cancer Father   . Lung cancer Brother   . Lymphoma Sister   . Breast cancer Neg Hx     Social History Social History   Tobacco Use  . Smoking status: Never Smoker  . Smokeless tobacco: Never Used  . Tobacco comment: smoking cessation materials not required  Substance Use Topics  . Alcohol use: No  . Drug use: No    Review of Systems  Constitutional: Negative for fever. Musculoskeletal: Positive for wrist pain. Neurological: Negative for  focal weakness or numbness. ____________________________________________  PHYSICAL EXAM:  VITAL SIGNS: ED  Triage Vitals  Enc Vitals Group     BP 02/07/18 0954 (!) 168/82     Pulse Rate 02/07/18 0954 90     Resp 02/07/18 0954 18     Temp 02/07/18 0954 98.6 F (37 C)     Temp Source 02/07/18 0954 Oral     SpO2 02/07/18 0954 95 %     Weight 02/07/18 0953 118 lb (53.5 kg)     Height 02/07/18 0953 5\' 5"  (1.651 m)     Head Circumference --      Peak Flow --      Pain Score 02/07/18 0951 9     Pain Loc --      Pain Edu? --      Excl. in [place]? --     Constitutional: Alert and oriented. Well appearing and in no distress. Cardiovascular: Radial  pulse 2+ on the right, cap refill < 3 seconds. Musculoskeletal: Decreased flexion and extension of the right wrist.  Normal rotation.  No pain with palpation of the distal radius or ulna.  Pain with palpation over the carpals.  No pain with palpation over the metacarpals.  1+ swelling noted over the [facility].  Handgrips unequal,L>R. Neurologic: No gross focal neurologic deficits are appreciated.  _____________________________________________   RADIOLOGY  Imaging Orders     DG Hand Complete Right IMPRESSION: Soft tissue swelling about the wrist. Negative for acute bony or joint abnormality.   Scattered osteoarthritis. Degenerative disease at the second and third MCP joints may be due to CPPD arthropathy given chondrocalcinosis.  ____________________________________________  PROCEDURES  .Splint Application Date/Time: 6/0/7371 10:48 AM Performed by: Jearld Fenton, NP Authorized by: Jearld Fenton, NP   Consent:    Consent obtained:  Verbal   Consent given by:  Patient   Risks discussed:  Pain   Alternatives discussed:  No treatment Pre-procedure details:    Sensation:  Normal Procedure details:    Laterality:  Right   Location:  Wrist   Wrist:  R wrist   Strapping: no     Splint type: ace wrap.   Supplies:  Elastic bandage Post-procedure details:    Pain:  Unchanged   Sensation:  Normal   Patient tolerance of procedure:  Tolerated  well, no immediate complications    ____________________________________________  INITIAL IMPRESSION / ASSESSMENT AND PLAN / ED COURSE  Right wrist pain and swelling status post fall:  X-ray negative for acute fracture Right wrist wrapped with an Ace wrap Rx provided for naproxen 375 mg twice daily as needed until pain and swelling resolves-advised her to consume with food Encouraged ice and elevation at home to help reduce swelling ____________________________________________  FINAL CLINICAL IMPRESSION(S) / ED DIAGNOSES  Final diagnoses:  Acute pain of right wrist  Fall, initial encounter      Jearld Fenton, NP 02/07/18 [place]    Arta Silence, MD 02/07/18 1132

## 2018-02-08 ENCOUNTER — Other Ambulatory Visit: Payer: Self-pay

## 2018-02-10 ENCOUNTER — Emergency Department: Payer: Medicare Other

## 2018-02-10 ENCOUNTER — Inpatient Hospital Stay
Admission: EM | Admit: 2018-02-10 | Discharge: 2018-02-13 | DRG: 872 | Disposition: A | Discharge status: Home Health | Payer: Medicare Other | Attending: Internal Medicine | Admitting: Internal Medicine

## 2018-02-10 ENCOUNTER — Encounter: Payer: Self-pay | Admitting: Emergency Medicine

## 2018-02-10 ENCOUNTER — Inpatient Hospital Stay: Payer: Medicare Other

## 2018-02-10 ENCOUNTER — Other Ambulatory Visit: Payer: Self-pay

## 2018-02-10 DIAGNOSIS — J45909 Unspecified asthma, uncomplicated: Secondary | ICD-10-CM | POA: Diagnosis present

## 2018-02-10 DIAGNOSIS — I129 Hypertensive chronic kidney disease with stage 1 through stage 4 chronic kidney disease, or unspecified chronic kidney disease: Secondary | ICD-10-CM | POA: Diagnosis present

## 2018-02-10 DIAGNOSIS — D631 Anemia in chronic kidney disease: Secondary | ICD-10-CM | POA: Diagnosis present

## 2018-02-10 DIAGNOSIS — A4101 Sepsis due to Methicillin susceptible Staphylococcus aureus: Principal | ICD-10-CM | POA: Diagnosis present

## 2018-02-10 DIAGNOSIS — K219 Gastro-esophageal reflux disease without esophagitis: Secondary | ICD-10-CM | POA: Diagnosis present

## 2018-02-10 DIAGNOSIS — Z807 Family history of other malignant neoplasms of lymphoid, hematopoietic and related tissues: Secondary | ICD-10-CM

## 2018-02-10 DIAGNOSIS — H409 Unspecified glaucoma: Secondary | ICD-10-CM | POA: Diagnosis present

## 2018-02-10 DIAGNOSIS — Z9851 Tubal ligation status: Secondary | ICD-10-CM | POA: Diagnosis not present

## 2018-02-10 DIAGNOSIS — Z9841 Cataract extraction status, right eye: Secondary | ICD-10-CM | POA: Diagnosis not present

## 2018-02-10 DIAGNOSIS — E871 Hypo-osmolality and hyponatremia: Secondary | ICD-10-CM | POA: Diagnosis present

## 2018-02-10 DIAGNOSIS — L7622 Postprocedural hemorrhage and hematoma of skin and subcutaneous tissue following other procedure: Secondary | ICD-10-CM | POA: Diagnosis not present

## 2018-02-10 DIAGNOSIS — Y838 Other surgical procedures as the cause of abnormal reaction of the patient, or of later complication, without mention of misadventure at the time of the procedure: Secondary | ICD-10-CM | POA: Diagnosis not present

## 2018-02-10 DIAGNOSIS — Z7982 Long term (current) use of aspirin: Secondary | ICD-10-CM

## 2018-02-10 DIAGNOSIS — Z801 Family history of malignant neoplasm of trachea, bronchus and lung: Secondary | ICD-10-CM

## 2018-02-10 DIAGNOSIS — Y92230 Patient room in hospital as the place of occurrence of the external cause: Secondary | ICD-10-CM | POA: Diagnosis not present

## 2018-02-10 DIAGNOSIS — Z8052 Family history of malignant neoplasm of bladder: Secondary | ICD-10-CM

## 2018-02-10 DIAGNOSIS — L0390 Cellulitis, unspecified: Secondary | ICD-10-CM | POA: Diagnosis present

## 2018-02-10 DIAGNOSIS — E1122 Type 2 diabetes mellitus with diabetic chronic kidney disease: Secondary | ICD-10-CM | POA: Diagnosis present

## 2018-02-10 DIAGNOSIS — Z9842 Cataract extraction status, left eye: Secondary | ICD-10-CM

## 2018-02-10 DIAGNOSIS — E876 Hypokalemia: Secondary | ICD-10-CM | POA: Diagnosis present

## 2018-02-10 DIAGNOSIS — L02413 Cutaneous abscess of right upper limb: Secondary | ICD-10-CM | POA: Diagnosis present

## 2018-02-10 DIAGNOSIS — Z90710 Acquired absence of both cervix and uterus: Secondary | ICD-10-CM | POA: Diagnosis not present

## 2018-02-10 DIAGNOSIS — D6959 Other secondary thrombocytopenia: Secondary | ICD-10-CM | POA: Diagnosis present

## 2018-02-10 DIAGNOSIS — Z800 Family history of malignant neoplasm of digestive organs: Secondary | ICD-10-CM

## 2018-02-10 DIAGNOSIS — L03113 Cellulitis of right upper limb: Secondary | ICD-10-CM | POA: Diagnosis present

## 2018-02-10 DIAGNOSIS — E860 Dehydration: Secondary | ICD-10-CM | POA: Diagnosis present

## 2018-02-10 DIAGNOSIS — E872 Acidosis, unspecified: Secondary | ICD-10-CM

## 2018-02-10 DIAGNOSIS — Z7984 Long term (current) use of oral hypoglycemic drugs: Secondary | ICD-10-CM

## 2018-02-10 DIAGNOSIS — N181 Chronic kidney disease, stage 1: Secondary | ICD-10-CM | POA: Diagnosis present

## 2018-02-10 DIAGNOSIS — Z792 Long term (current) use of antibiotics: Secondary | ICD-10-CM

## 2018-02-10 DIAGNOSIS — Z85828 Personal history of other malignant neoplasm of skin: Secondary | ICD-10-CM | POA: Diagnosis not present

## 2018-02-10 DIAGNOSIS — Z885 Allergy status to narcotic agent status: Secondary | ICD-10-CM

## 2018-02-10 LAB — TROPONIN I: Troponin I: <0.03 ng/mL (ref <0.03)

## 2018-02-10 LAB — COMPREHENSIVE METABOLIC PANEL
ALT: 112 U/L — ABNORMAL HIGH (ref 14–54)
AST: 109 U/L — ABNORMAL HIGH (ref 15–41)
Albumin: 2.8 g/dL — ABNORMAL LOW (ref 3.5–5.0)
Alkaline Phosphatase: 144 U/L — ABNORMAL HIGH (ref 38–126)
Anion gap: 10 (ref 5–15)
BUN: 25 mg/dL — ABNORMAL HIGH (ref 6–20)
CO2: 24 mmol/L (ref 22–32)
Calcium: 8.2 mg/dL — ABNORMAL LOW (ref 8.9–10.3)
Chloride: 92 mmol/L — ABNORMAL LOW (ref 101–111)
Creatinine, Ser: 0.83 mg/dL (ref 0.44–1.00)
GFR calc Af Amer: >60 mL/min (ref >60)
GFR calc non Af Amer: >60 mL/min (ref >60)
Glucose, Bld: 276 mg/dL — ABNORMAL HIGH (ref 65–99)
Potassium: 3.2 mmol/L — ABNORMAL LOW (ref 3.5–5.1)
Sodium: 126 mmol/L — ABNORMAL LOW (ref 135–145)
Total Bilirubin: 0.7 mg/dL (ref 0.3–1.2)
Total Protein: 6.8 g/dL (ref 6.5–8.1)

## 2018-02-10 LAB — CBC WITH DIFFERENTIAL/PLATELET
Band Neutrophils: 3 %
Basophils Absolute: 0 10*3/uL (ref 0–0.1)
Basophils Relative: 0 %
Blasts: 0 %
Eosinophils Absolute: 0.9 10*3/uL — ABNORMAL HIGH (ref 0–0.7)
Eosinophils Relative: 3 %
HCT: 28.8 % — ABNORMAL LOW (ref 35.0–47.0)
Hemoglobin: 9.4 g/dL — ABNORMAL LOW (ref 12.0–16.0)
Lymphocytes Relative: 1 %
Lymphs Abs: 0.3 10*3/uL — ABNORMAL LOW (ref 1.0–3.6)
MCH: 29 pg (ref 26.0–34.0)
MCHC: 32.5 g/dL (ref 32.0–36.0)
MCV: 89.2 fL (ref 80.0–100.0)
Metamyelocytes Relative: 0 %
Monocytes Absolute: 6.7 10*3/uL — ABNORMAL HIGH (ref 0.2–0.9)
Monocytes Relative: 23 %
Myelocytes: 0 %
Neutro Abs: 21.2 10*3/uL — ABNORMAL HIGH (ref 1.4–6.5)
Neutrophils Relative %: 70 %
Other: 0 %
Platelets: 67 10*3/uL — ABNORMAL LOW (ref 150–440)
Promyelocytes Relative: 0 %
RBC: 3.23 MIL/uL — ABNORMAL LOW (ref 3.80–5.20)
RDW: 16.3 % — ABNORMAL HIGH (ref 11.5–14.5)
WBC: 29.1 10*3/uL — ABNORMAL HIGH (ref 3.6–11.0)
nRBC: 0 /100 WBC

## 2018-02-10 LAB — URINALYSIS, COMPLETE (UACMP) WITH MICROSCOPIC
Bilirubin Urine: NEGATIVE
Glucose, UA: >=500 mg/dL — AB
Hgb urine dipstick: NEGATIVE
Ketones, ur: NEGATIVE mg/dL
Leukocytes, UA: NEGATIVE
Nitrite: NEGATIVE
Protein, ur: 100 mg/dL — AB
Specific Gravity, Urine: 1.022 (ref 1.005–1.030)
pH: 5 (ref 5.0–8.0)

## 2018-02-10 LAB — LACTIC ACID, PLASMA
Lactic Acid, Venous: 2.4 mmol/L (ref 0.5–1.9)
Lactic Acid, Venous: 1.5 mmol/L (ref 0.5–1.9)

## 2018-02-10 LAB — GLUCOSE, CAPILLARY
Glucose-Capillary: 268 mg/dL — ABNORMAL HIGH (ref 65–99)
Glucose-Capillary: 204 mg/dL — ABNORMAL HIGH (ref 65–99)
Glucose-Capillary: 197 mg/dL — ABNORMAL HIGH (ref 65–99)

## 2018-02-10 MED ORDER — LATANOPROST 0.005 % OP SOLN
1.0000 [drp] | Freq: Every day | OPHTHALMIC | Status: DC
  Administered 2018-02-10 – 2018-02-12 (×3): 1 [drp] via OPHTHALMIC
  Filled 2018-02-10: qty 2.5

## 2018-02-10 MED ORDER — KETOROLAC TROMETHAMINE 30 MG/ML IJ SOLN
15.0000 mg | Freq: Four times a day (QID) | INTRAMUSCULAR | Status: DC | PRN
  Administered 2018-02-11: 08:00:00 15 mg via INTRAVENOUS
  Filled 2018-02-10 (×2): qty 1

## 2018-02-10 MED ORDER — LISINOPRIL 10 MG PO TABS
10.0000 mg | ORAL_TABLET | Freq: Every day | ORAL | Status: DC
  Administered 2018-02-11 – 2018-02-13 (×3): 10 mg via ORAL
  Filled 2018-02-10 (×3): qty 1

## 2018-02-10 MED ORDER — ONDANSETRON HCL 4 MG/2ML IJ SOLN: 4.0000 mg | Freq: Four times a day (QID) | INTRAMUSCULAR | Status: DC | PRN

## 2018-02-10 MED ORDER — VANCOMYCIN HCL IN DEXTROSE 1-5 GM/200ML-% IV SOLN
1000.0000 mg | Freq: Once | INTRAVENOUS | Status: AC
Start: 2018-02-10 — End: 2018-02-10
  Administered 2018-02-10: 1000 mg via INTRAVENOUS
  Filled 2018-02-10: qty 200

## 2018-02-10 MED ORDER — METFORMIN HCL 850 MG PO TABS
850.0000 mg | ORAL_TABLET | Freq: Two times a day (BID) | ORAL | Status: DC
  Administered 2018-02-10 – 2018-02-13 (×6): 850 mg via ORAL
  Filled 2018-02-10 (×7): qty 1

## 2018-02-10 MED ORDER — ACETAMINOPHEN 325 MG PO TABS
650.0000 mg | ORAL_TABLET | Freq: Four times a day (QID) | ORAL | Status: DC | PRN
  Administered 2018-02-10 – 2018-02-12 (×2): 650 mg via ORAL
  Filled 2018-02-10 (×2): qty 2

## 2018-02-10 MED ORDER — GADOBENATE DIMEGLUMINE 529 MG/ML IV SOLN
10.0000 mL | Freq: Once | INTRAVENOUS | Status: AC | PRN
  Administered 2018-02-10: 10 mL via INTRAVENOUS

## 2018-02-10 MED ORDER — ADULT MULTIVITAMIN W/MINERALS CH
2.0000 | ORAL_TABLET | Freq: Every day | ORAL | Status: DC
  Administered 2018-02-11 – 2018-02-13 (×3): 2 via ORAL
  Filled 2018-02-10 (×4): qty 2

## 2018-02-10 MED ORDER — POLYVINYL ALCOHOL 1.4 % OP SOLN
1.0000 [drp] | Freq: Three times a day (TID) | OPHTHALMIC | Status: DC | PRN
  Filled 2018-02-10: qty 15

## 2018-02-10 MED ORDER — SODIUM CHLORIDE 0.9 % IV SOLN
INTRAVENOUS | Status: DC
  Administered 2018-02-10 – 2018-02-12 (×5): via INTRAVENOUS

## 2018-02-10 MED ORDER — BUTALBITAL-APAP-CAFFEINE 50-325-40 MG PO TABS
1.0000 | ORAL_TABLET | Freq: Four times a day (QID) | ORAL | Status: DC | PRN
Start: 2018-02-10 — End: 2018-02-13
  Filled 2018-02-10: qty 1

## 2018-02-10 MED ORDER — VANCOMYCIN HCL IN DEXTROSE 750-5 MG/150ML-% IV SOLN
750.0000 mg | INTRAVENOUS | Status: DC
  Administered 2018-02-11: 750 mg via INTRAVENOUS
  Filled 2018-02-10: qty 150

## 2018-02-10 MED ORDER — MOMETASONE FURO-FORMOTEROL FUM 100-5 MCG/ACT IN AERO
2.0000 | INHALATION_SPRAY | Freq: Two times a day (BID) | RESPIRATORY_TRACT | Status: DC
  Administered 2018-02-10 – 2018-02-13 (×5): 2 via RESPIRATORY_TRACT
  Filled 2018-02-10: qty 8.8

## 2018-02-10 MED ORDER — SENNOSIDES-DOCUSATE SODIUM 8.6-50 MG PO TABS
1.0000 | ORAL_TABLET | Freq: Every evening | ORAL | Status: DC | PRN
  Filled 2018-02-10: qty 1

## 2018-02-10 MED ORDER — POTASSIUM CHLORIDE CRYS ER 20 MEQ PO TBCR
40.0000 meq | EXTENDED_RELEASE_TABLET | Freq: Once | ORAL | Status: AC
  Administered 2018-02-10: 40 meq via ORAL
  Filled 2018-02-10 (×2): qty 2

## 2018-02-10 MED ORDER — CARVEDILOL 3.125 MG PO TABS
6.2500 mg | ORAL_TABLET | Freq: Two times a day (BID) | ORAL | Status: DC
  Administered 2018-02-11 – 2018-02-13 (×4): 6.25 mg via ORAL
  Filled 2018-02-10 (×5): qty 2

## 2018-02-10 MED ORDER — INSULIN ASPART 100 UNIT/ML ~~LOC~~ SOLN: 0.0000 [IU] | Freq: Every day | SUBCUTANEOUS | Status: DC

## 2018-02-10 MED ORDER — INSULIN ASPART 100 UNIT/ML ~~LOC~~ SOLN
0.0000 [IU] | Freq: Three times a day (TID) | SUBCUTANEOUS | Status: DC
  Administered 2018-02-10: 17:00:00 5 [IU] via SUBCUTANEOUS
  Administered 2018-02-11: 08:00:00 2 [IU] via SUBCUTANEOUS
  Administered 2018-02-11 – 2018-02-12 (×4): 3 [IU] via SUBCUTANEOUS
  Administered 2018-02-13: 2 [IU] via SUBCUTANEOUS
  Administered 2018-02-13: 3 [IU] via SUBCUTANEOUS
  Filled 2018-02-10 (×8): qty 1

## 2018-02-10 MED ORDER — ALBUTEROL SULFATE (2.5 MG/3ML) 0.083% IN NEBU
2.5000 mg | INHALATION_SOLUTION | Freq: Four times a day (QID) | RESPIRATORY_TRACT | Status: DC | PRN
Start: 2018-02-10 — End: 2018-02-13

## 2018-02-10 MED ORDER — ASPIRIN 81 MG PO CHEW
81.0000 mg | CHEWABLE_TABLET | Freq: Every day | ORAL | Status: DC
  Administered 2018-02-10 – 2018-02-13 (×4): 81 mg via ORAL
  Filled 2018-02-10 (×4): qty 1

## 2018-02-10 MED ORDER — SODIUM CHLORIDE 0.9 % IV SOLN
1000.0000 mL | Freq: Once | INTRAVENOUS | Status: AC
  Administered 2018-02-10: 1000 mL via INTRAVENOUS

## 2018-02-10 MED ORDER — ONDANSETRON HCL 4 MG PO TABS
4.0000 mg | ORAL_TABLET | Freq: Four times a day (QID) | ORAL | Status: DC | PRN
  Administered 2018-02-13: 17:00:00 4 mg via ORAL
  Filled 2018-02-10: qty 1

## 2018-02-10 NOTE — Progress Notes (Signed)
Pharmacy Antibiotic Note  Julie Holland is a 77 y.o. female admitted on 02/10/2018 with sepsis.  Pharmacy has been consulted for vancomycin dosing.  Plan: Vancomycin 1000mg  given at 1215pm in ED. Stacked dose interval calculated at 13h. Therefore, will proceed with vancomycin 750mg  IV every 24 hours.  Goal trough 15-20 mcg/mL. Will order Vt prior to the 4th dose  Ke: 0.037, Vd: 37.5L, T1/2: 18.7h, calculated concentrations at steady-state: 36.8/15.7 mcg/mL  Height: 5\' 5"  (165.1 cm) Weight: 118 lb (53.5 kg) IBW/kg (Calculated) : 57  Temp (24hrs), Avg:98.6 F (37 C), Min:98.6 F (37 C), Max:98.6 F (37 C)  Recent Labs  Lab 02/10/18 1033 02/10/18 1122  WBC 29.1*  --   CREATININE 0.83  --   LATICACIDVEN  --  2.4*    Estimated Creatinine Clearance: 47.2 mL/min (by C-G formula based on SCr of 0.83 mg/dL).    Allergies  Allergen Reactions  . Hydrocodone Nausea And Vomiting and Other (See Comments)    Vertigo but patient takes hydrocodone/acetaminophen outpatient    Antimicrobials this admission: Vancomycin 6/5  >>   Microbiology results: 6/5 BCx: pending 6/5 UA: pending   Thank you for allowing pharmacy to be a part of this patient's care.  Dallie Piles, PharmD 02/10/2018 1:47 PM

## 2018-02-10 NOTE — ED Notes (Signed)
Pt assisted to commode. Pt reports unable to urinate at this time. IVF ordered and given. Will reassess.

## 2018-02-10 NOTE — Progress Notes (Signed)
Advanced care plan. Purpose of the Encounter: CODE STATUS Parties in Attendance: Patient and family Patient's Decision Capacity: Good Subjective/Patient's story: Presented for pain and swelling in the right wrist Objective/Medical story Patient had a fall 1 week ago Has a right wrist cellulitis Goals of care determination:  Advance care directives and goals of care discussed with the patient in detail For now patient and family want everything done which includes cardiac resuscitation, intubation and ventilator if the need arises CODE STATUS: Full Code Time spent discussing advanced care planning: 16 minutes

## 2018-02-10 NOTE — Consult Note (Signed)
Patient is a 77 year old who suffered a fall a week ago and has market swelling to her right hand and wrist.  She reports not seeking any treatment initially but has developed increasing swelling and pain.  On exam the hand is markedly swollen with a great deal of edema on the volar aspect of the hand as well as bruising and swelling on the dorsal radial side of the hand with finger swelling and stiffness.  She does not have a great deal of pain with passive flexion no evidence of flexor tenosynovitis or compartment syndrome. Impression is soft tissue contusion with subsequent cellulitis possible abscess formation Agree with MRI if abscess is present will need incision and drainage but if only cellulitis will need continued watching and compression

## 2018-02-10 NOTE — ED Provider Notes (Signed)
[facility] Emergency Department Provider Note       Time seen: ----------------------------------------- 10:22 AM on 02/10/2018 -----------------------------------------   I have reviewed the triage vital signs and the nursing notes.  HISTORY   Chief Complaint No chief complaint on file.    HPI Julie Holland is a 77 y.o. female with a history of arthritis, asthma, chronic kidney disease, diabetes, GERD, hyperlipidemia and hypertension who presents to the ED for weakness and diarrhea.  Patient was seen recently for right hand pain from a recent fall.  It was noted to be swollen with ecchymosis.  She was started on Naprosyn from her fall.  She states she been having diarrhea and feels very weak and dizzy.  She denies fevers, chills or other complaints.  Past Medical History:  Diagnosis Date  . Arthritis   . Asthma   . 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  . Cataract   . Chronic kidney disease    stage 1  . Diabetes mellitus    type II  . Dry eye of right side   . GERD (gastroesophageal reflux disease)    OCC  . Glaucoma 2018   RIGHT EYE   . Hemorrhoids   . Hyperlipidemia   . Hypertension     Patient Active Problem List   Diagnosis Date Noted  . Need for vaccination with 13-polyvalent pneumococcal conjugate vaccine 12/10/2017  . Urine test positive for microalbuminuria 12/10/2017  . Anemia 05/02/2017  . Anemia associated with acute blood loss 03/09/2017  . Arthritis of left knee 12/10/2016  . Neutropenia ([place]) 10/20/2016  . Thrombocytopenia ([place]) 10/20/2016  . Postoperative hemorrhage involving digestive system following digestive system procedure 08/04/2016  . Rectal bleeding 07/31/2016  . Cancer of anal canal ([place]) 07/29/2016  . 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 Asthma 08/17/2015  . Umbilical hernia without obstruction and without gangrene  02/27/2015  . 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 Routine general medical examination at a health care facility 03/15/2013  . 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 Shoulder pain 11/23/2012  . Right bundle branch block 11/11/2012  . Diabetes mellitus, controlled ([place])   . Hyperlipidemia   . Hypertension     Past Surgical History:  Procedure Laterality Date  . CATARACT EXTRACTION, BILATERAL    . CHOLECYSTECTOMY    . COLONOSCOPY  02/13/13   Dr Bary Castilla  . EPIGASTRIC HERNIA REPAIR N/A 03/20/2015   Procedure: HERNIA REPAIR EPIGASTRIC ADULT;  Surgeon: Robert Bellow, MD;  Location: [facility] ORS;  Service: General;  Laterality: N/A;  . HEMORRHOID SURGERY N/A 03/20/2015    FOCAL HIGH-GRADE SQUAMOUS INTRAEPITHELIAL LESION (HSIL, ANAL /HEMORRHOIDECTOMY;   Robert Bellow, MD [facility] ORS;  : General;  Laterality: N/A;  . HERNIA REPAIR  July 2016   Epigastric hernia, primary repair  . TONSILLECTOMY  age 31  . TOTAL ABDOMINAL HYSTERECTOMY  01/1989  . TUBAL LIGATION    . 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  Case has been presented at the [facility] tumor board. No indication for additional treatment outside of serial exams  Surgeon: Robert Bellow, MD;  Location: [facility] ORS;  Service: General;  Laterality: N/A;    Allergies Hydrocodone  Social History Social History   Tobacco Use  . Smoking status: Never Smoker  . Smokeless tobacco: Never Used  .  Tobacco comment: smoking cessation materials not required  Substance Use Topics  . Alcohol use: No  . Drug use: No   Review of Systems Constitutional: Negative for fever. Cardiovascular: Negative for chest pain. Respiratory: Negative for shortness of breath. Gastrointestinal: Negative for abdominal pain, vomiting and diarrhea. Musculoskeletal: Positive right hand pain and swelling Skin: Positive for right wrist ecchymosis Neurological: Positive for generalized  weakness  All systems negative/normal/unremarkable except as stated in the HPI  ____________________________________________   PHYSICAL EXAM:  VITAL SIGNS: ED Triage Vitals  Enc Vitals Group     BP      Pulse      Resp      Temp      Temp src      SpO2      Weight      Height      Head Circumference      Peak Flow      Pain Score      Pain Loc      Pain Edu?      Excl. in [place]?    Constitutional: Alert and oriented. Well appearing and in no distress. Eyes: Conjunctivae are normal. Normal extraocular movements. ENT   Head: Normocephalic and atraumatic.   Nose: No congestion/rhinnorhea.   Mouth/Throat: Mucous membranes are moist.   Neck: No stridor. Cardiovascular: Rapid rate, regular rhythm. No murmurs, rubs, or gallops. Respiratory: Normal respiratory effort without tachypnea nor retractions. Breath sounds are clear and equal bilaterally. No wheezes/rales/rhonchi. Gastrointestinal: Soft and nontender. Normal bowel sounds Musculoskeletal: Limited range of motion of the right hand and right wrist.  There is diffuse edema with ecchymosis noted around the wrist and base of the thumb dorsally Neurologic:  Normal speech and language. No gross focal neurologic deficits are appreciated.  Skin: Ecchymosis and edema of the right hand and wrist Psychiatric: Mood and affect are normal. Speech and behavior are normal.  ____________________________________________  ED COURSE:  As part of my medical decision making, I reviewed the following data within the [place] History obtained from family if available, nursing notes, old chart and ekg, as well as notes from prior ED visits. Patient presented for weakness, we will assess with labs and imaging as indicated at this time.   Procedures ____________________________________________   LABS (pertinent positives/negatives)  Labs Reviewed  CBC WITH DIFFERENTIAL/PLATELET - Abnormal; Notable for the  following components:      Result Value   WBC 29.1 (*)    RBC 3.23 (*)    Hemoglobin 9.4 (*)    HCT 28.8 (*)    RDW 16.3 (*)    Platelets 67 (*)    Neutro Abs 21.2 (*)    Lymphs Abs 0.3 (*)    Monocytes Absolute 6.7 (*)    Eosinophils Absolute 0.9 (*)    All other components within normal limits  COMPREHENSIVE METABOLIC PANEL - Abnormal; Notable for the following components:   Sodium 126 (*)    Potassium 3.2 (*)    Chloride 92 (*)    Glucose, Bld 276 (*)    BUN 25 (*)    Calcium 8.2 (*)    Albumin 2.8 (*)    AST 109 (*)    ALT 112 (*)    Alkaline Phosphatase 144 (*)    All other components within normal limits  GLUCOSE, CAPILLARY - Abnormal; Notable for the following components:   Glucose-Capillary 268 (*)    All other components within normal limits  LACTIC ACID, PLASMA -  Abnormal; Notable for the following components:   Lactic Acid, Venous 2.4 (*)    All other components within normal limits  CULTURE, BLOOD (ROUTINE X 2)  CULTURE, BLOOD (ROUTINE X 2)  TROPONIN I  URINALYSIS, COMPLETE (UACMP) WITH MICROSCOPIC  CBG MONITORING, ED   CRITICAL CARE Performed by: Laurence Aly   Total critical care time: 30 minutes  Critical care time was exclusive of separately billable procedures and treating other patients.  Critical care was necessary to treat or prevent imminent or life-threatening deterioration.  Critical care was time spent personally by me on the following activities: development of treatment plan with patient and/or surrogate as well as nursing, discussions with consultants, evaluation of patient's response to treatment, examination of patient, obtaining history from patient or surrogate, ordering and performing treatments and interventions, ordering and review of laboratory studies, ordering and review of radiographic studies, pulse oximetry and re-evaluation of patient's condition.  ____________________________________________  DIFFERENTIAL  DIAGNOSIS   Fracture, contusion, sprain, hematoma, cellulitis  FINAL ASSESSMENT AND PLAN  Cellulitis, hyponatremia, lactic acidosis   Plan: The patient had presented for right hand swelling and pain.  Patient states the swelling did not occur until the last 24 hours. Patient's labs revealed marketed leukocytosis with continued thrombocytopenia and some anemia as well.  Labs were also indicative of some dehydration.  Patient's imaging did not reveal any acute process.  He is under clear to me as to why this has occurred.  Clinically she does not look septic but has laboratory findings suggestive of same.  I have ordered cultures and given IV vancomycin.  I will discuss with the hospitalist for admission.   Laurence Aly, MD   Note: This note was generated in part or whole with voice recognition software. Voice recognition is usually quite accurate but there are transcription errors that can and very often do occur. I apologize for any typographical errors that were not detected and corrected.     Earleen Newport, MD 02/10/18 1256

## 2018-02-10 NOTE — H&P (Addendum)
[facility] NAME: Julie Holland    MR#:  [id number]  DATE OF BIRTH:  [birth year]  DATE OF ADMISSION:  02/10/2018  PRIMARY CARE PHYSICIAN: [name], MD   REQUESTING/REFERRING PHYSICIAN:   CHIEF COMPLAINT:   Chief Complaint  Patient presents with  . Diarrhea  . Hand Pain    HISTORY OF PRESENT ILLNESS: Julie Holland  is a 77 y.o. female with a known history of arthritis, perianal carcinoma in situ, chronic kidney disease stage I, diabetes mellitus type 2, GERD, glaucoma, hemorrhoids, hyperlipidemia, hypertension presented to the emergency room with pain and swelling in the right wrist.  Patient had a fall at home secondary to loss of balance 1 week ago last Thursday.  She fell on her outstretched right hand imaging studies of the right upper extremity no fractures.  Patient has redness and swelling around the right wrist and is tender to touch.  Patient also had 2 episodes of diarrhea but which completely stopped now.  Patient was given antibiotic for cellulitis in the wrist.  WBC count is elevated.  Tenderness in the wrist is aching in nature 7 out of 10 on a scale of 1-10. Her platelet counts have also dropped and there was mild elevation of liver function tests.  Hospitalist service was consulted for further care.  PAST MEDICAL HISTORY:   Past Medical History:  Diagnosis Date  . Arthritis   . Asthma   . Cancer ([place]) 03/2015   In situ carcinoma of the perianal skin, incidental finding at hemorrhoidectomy.  . Cataract   . Chronic kidney disease    stage 1  . Diabetes mellitus    type II  . Dry eye of right side   . GERD (gastroesophageal reflux disease)    OCC  . Glaucoma 2018   RIGHT EYE   . Hemorrhoids   . Hyperlipidemia   . Hypertension     PAST SURGICAL HISTORY:  Past Surgical History:  Procedure Laterality Date  . CATARACT EXTRACTION, BILATERAL    . CHOLECYSTECTOMY    . COLONOSCOPY  02/13/13   Dr Bary Castilla   . EPIGASTRIC HERNIA REPAIR N/A 03/20/2015   Procedure: HERNIA REPAIR EPIGASTRIC ADULT;  Surgeon: Robert Bellow, MD;  Location: [facility] ORS;  Service: General;  Laterality: N/A;  . HEMORRHOID SURGERY N/A 03/20/2015    FOCAL HIGH-GRADE SQUAMOUS INTRAEPITHELIAL LESION (HSIL, ANAL /HEMORRHOIDECTOMY;   Robert Bellow, MD [facility] ORS;  : General;  Laterality: N/A;  . HERNIA REPAIR  July 2016   Epigastric hernia, primary repair  . TONSILLECTOMY  age 38  . TOTAL ABDOMINAL HYSTERECTOMY  01/1989  . TUBAL LIGATION    . 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  Case has been presented at the [facility] tumor board. No indication for additional treatment outside of serial exams  Surgeon: Robert Bellow, MD;  Location: [facility] ORS;  Service: General;  Laterality: N/A;    SOCIAL HISTORY:  Social History   Tobacco Use  . Smoking status: Never Smoker  . Smokeless tobacco: Never Used  . Tobacco comment: smoking cessation materials not required  Substance Use Topics  . Alcohol use: No    FAMILY HISTORY:  Family History  Problem Relation Age of Onset  . Bladder Cancer Mother   . Colon cancer Father   . Lung cancer Brother   . Lymphoma Sister   .  Breast cancer Neg Hx     DRUG ALLERGIES:  Allergies  Allergen Reactions  . Hydrocodone Nausea And Vomiting and Other (See Comments)    Vertigo but patient takes hydrocodone/acetaminophen outpatient    REVIEW OF SYSTEMS:   CONSTITUTIONAL: No fever, has fatigue and weakness.  EYES: No blurred or double vision.  EARS, NOSE, AND THROAT: No tinnitus or ear pain.  RESPIRATORY: No cough, shortness of breath, wheezing or hemoptysis.  CARDIOVASCULAR: No chest pain, orthopnea, edema.  GASTROINTESTINAL: No nausea, vomiting, diarrhea or abdominal pain.  GENITOURINARY: No dysuria, hematuria.  ENDOCRINE: No polyuria, nocturia,  HEMATOLOGY: No anemia, easy bruising or  bleeding SKIN: No rash or lesion. MUSCULOSKELETAL: Right wrist swollen and tender NEUROLOGIC: No tingling, numbness, weakness.  PSYCHIATRY: No anxiety or depression.   MEDICATIONS AT HOME:  Prior to Admission medications   Medication Sig Start Date End Date Taking? Authorizing Provider  ACCU-CHEK SMARTVIEW test strip USE AS INSTRUCTED TO CHECK  BLOOD GLUCOSE ONCE DAILY if desired; LON 99 months; Dx E11.9 12/17/17  Yes Lada, Satira Anis, MD  acetaminophen (TYLENOL) 500 MG tablet Take 500-1,000 mg by mouth every 6 (six) hours as needed (for pain).   Yes [provider]  ADVAIR DISKUS 100-50 MCG/DOSE AEPB USE 1 PUFF TWO TIMES DAILY 05/29/17  Yes Roselee Nova, MD  albuterol (PROVENTIL HFA;VENTOLIN HFA) 108 (90 Base) MCG/ACT inhaler Inhale 2 puffs into the lungs every 6 (six) hours as needed for wheezing or shortness of breath. 10/02/17  Yes Roselee Nova, MD  aspirin 81 MG chewable tablet Chew 81 mg by mouth daily.   Yes [provider]  butalbital-acetaminophen-caffeine (FIORICET, ESGIC) [phone] MG tablet Take 1-2 tablets by mouth every 6 (six) hours as needed for headache. 12/23/17 12/23/18 Yes Merlyn Lot, MD  Carboxymeth-Glycerin-Polysorb (REFRESH OPTIVE ADVANCED OP) Place 1 drop into both eyes 3 (three) times daily as needed (for dry/irritated eyes).   Yes [provider]  carvedilol (COREG) 6.25 MG tablet Take 1 tablet (6.25 mg total) by mouth 2 (two) times daily with a meal. 11/26/17  Yes Lada, Satira Anis, MD  latanoprost (XALATAN) 0.005 % ophthalmic solution Place 1 drop at bedtime into the right eye.   Yes [provider]  lisinopril (PRINIVIL,ZESTRIL) 10 MG tablet Take 1 tablet (10 mg total) by mouth daily. 12/10/17  Yes Lada, Satira Anis, MD  metFORMIN (GLUCOPHAGE) 850 MG tablet Take 1 tablet (850 mg total) by mouth 2 (two) times daily with a meal. 10/08/17  Yes Roselee Nova, MD  Multiple Vitamin (MULTIVITAMIN) tablet Take 2 tablets by mouth  daily.    Yes [provider]  Naproxen 375 MG TBEC Take 1 tablet (375 mg total) by mouth 2 (two) times daily as needed. 02/07/18  Yes Jearld Fenton, NP  Nutritional Supplements (ESTROVEN PO) Take 1 tablet by mouth daily.   Yes [provider]  simvastatin (ZOCOR) 20 MG tablet TAKE 1 TABLET BY MOUTH AT  BEDTIME 08/31/17  Yes Rochel Brome A, MD      PHYSICAL EXAMINATION:   VITAL SIGNS: Blood pressure 140/63, pulse (!) 103, temperature 98.6 F (37 C), temperature source Oral, resp. rate (!) 30, height 5\' 5"  (1.651 m), weight 53.5 kg (118 lb), SpO2 95 %.  GENERAL:  77 y.o.-year-old patient lying in the bed with no acute distress.  EYES: Pupils equal, round, reactive to light and accommodation. No scleral icterus. Extraocular muscles intact.  HEENT: Head atraumatic, normocephalic. Oropharynx and nasopharynx  clear.  NECK:  Supple, no jugular venous distention. No thyroid enlargement, no tenderness.  LUNGS: Normal breath sounds bilaterally, no wheezing, rales,rhonchi or crepitation. No use of accessory muscles of respiration.  CARDIOVASCULAR: S1, S2 normal. No murmurs, rubs, or gallops.  ABDOMEN: Soft, nontender, nondistended. Bowel sounds present. No organomegaly or mass.  EXTREMITIES: No pedal edema, cyanosis, or clubbing.  Right wrist : swelling present Tenderness present Redness of skin over right wrist noted NEUROLOGIC: Cranial nerves II through XII are intact. Muscle strength 5/5 in all extremities. Sensation intact. Gait not checked.  PSYCHIATRIC: The patient is alert and oriented x 3.  SKIN: No obvious rash, lesion, or ulcer.   LABORATORY PANEL:   CBC Recent Labs  Lab 02/10/18 1033  WBC 29.1*  HGB 9.4*  HCT 28.8*  PLT 67*  MCV 89.2  MCH 29.0  MCHC 32.5  RDW 16.3*  LYMPHSABS 0.3*  MONOABS 6.7*  EOSABS 0.9*  BASOSABS 0.0   ------------------------------------------------------------------------------------------------------------------  Chemistries   Recent Labs  Lab 02/10/18 1033  NA 126*  K 3.2*  CL 92*  CO2 24  GLUCOSE 276*  BUN 25*  CREATININE 0.83  CALCIUM 8.2*  AST 109*  ALT 112*  ALKPHOS 144*  BILITOT 0.7   ------------------------------------------------------------------------------------------------------------------ estimated creatinine clearance is 47.2 mL/min (by C-G formula based on SCr of 0.83 mg/dL). ------------------------------------------------------------------------------------------------------------------ No results for input(s): TSH, T4TOTAL, T3FREE, THYROIDAB in the last 72 hours.  Invalid input(s): FREET3   Coagulation profile No results for input(s): INR, PROTIME in the last 168 hours. ------------------------------------------------------------------------------------------------------------------- No results for input(s): DDIMER in the last 72 hours. -------------------------------------------------------------------------------------------------------------------  Cardiac Enzymes Recent Labs  Lab 02/10/18 1033  TROPONINI <0.03   ------------------------------------------------------------------------------------------------------------------ Invalid input(s): POCBNP  ---------------------------------------------------------------------------------------------------------------  Urinalysis    Component Value Date/Time   COLORURINE AMBER (A) 03/08/2017 1416   APPEARANCEUR HAZY (A) 03/08/2017 1416   APPEARANCEUR Cloudy 05/28/2013 1703   LABSPEC 1.020 03/08/2017 1416   LABSPEC 1.024 05/28/2013 1703   PHURINE 5.0 03/08/2017 1416   GLUCOSEU 50 (A) 03/08/2017 1416   GLUCOSEU 150 mg/dL 05/28/2013 1703   HGBUR NEGATIVE 03/08/2017 1416   BILIRUBINUR NEGATIVE 03/08/2017 1416   BILIRUBINUR Negative 05/28/2013 1703   KETONESUR 5 (A) 03/08/2017 1416   PROTEINUR 30 (A) 03/08/2017 1416   NITRITE NEGATIVE 03/08/2017 1416   LEUKOCYTESUR NEGATIVE 03/08/2017 1416   LEUKOCYTESUR 1+ 05/28/2013  1703     RADIOLOGY: Dg Wrist Complete Right  Result Date: 02/10/2018 CLINICAL DATA:  Acute RIGHT wrist pain following fall 1 week ago. Initial encounter. EXAM: RIGHT WRIST - COMPLETE 3+ VIEW COMPARISON:  02/07/2018 FINDINGS: No acute fracture, subluxation or dislocation identified. Dorsal soft tissue swelling noted. Chondrocalcinosis again identified. Degenerative changes at the first carpometacarpal joint again noted. IMPRESSION: Soft tissue swelling without acute bony abnormality. Degenerative changes and chondrocalcinosis. Electronically Signed   By: Margarette Canada M.D.   On: 02/10/2018 12:14   Dg Hand Complete Right  Result Date: 02/10/2018 CLINICAL DATA:  Acute RIGHT hand pain following fall 1 week ago. Initial encounter. EXAM: RIGHT HAND - COMPLETE 3+ VIEW COMPARISON:  02/07/2018 and 10/08/2015 FINDINGS: There is no evidence of acute fracture, subluxation or dislocation. Dorsal soft tissue swelling identified. Wrist chondrocalcinosis noted. Degenerative changes at the index and middle finger PIP joints and first carpometacarpal joint noted. IMPRESSION: Soft tissue swelling without acute bony abnormality. Electronically Signed   By: Margarette Canada M.D.   On: 02/10/2018 12:16    EKG: Orders placed or performed during the hospital encounter of 03/08/17  .  ED EKG  . ED EKG  . EKG 12-Lead  . EKG 12-Lead    IMPRESSION AND PLAN:  77 year old female patient with history of glaucoma, hypertension, diabetes mellitus type 2, perianal carcinoma in situ, chronic kidney disease stage I presented to the emergency room for history of fall and swelling in the right wrist and tenderness.  -Right wrist cellulitis Start patient on IV vancomycin antibiotic MR I of the right wrist to assess for any inflammatory arthritis Orthopedic surgery consultation for evaluation of the right wrist if he needs any incision and drainage  -Thrombocytopenia Significant drop in platelet count Hematology evaluation Could be  reactive  - mild elevation in liver function tests F/U liver function tests  -Acute hypokalemia Replace potassium orally  -Acute hyponatremia  IV fluid hydration and monitor electrolytes  -Sepsis secondary to cellulitis from the wrist IV antibiotics and IV fluids Follow-up lactic acid level  -Dehydration IV fluid hydration  All the records are reviewed and case discussed with ED provider. Management plans discussed with the patient, family and they are in agreement.  CODE STATUS:Full code Code Status History    Date Active Date Inactive Code Status Order ID Comments User Context   07/31/2016 1649 08/01/2016 1454 Full Code [id number]  Robert Bellow, MD Inpatient   07/31/2016 0918 07/31/2016 1649 Full Code [id number]  Robert Bellow, MD Inpatient       TOTAL TIME TAKING CARE OF THIS PATIENT: 54 minutes.    Saundra Shelling M.D on 02/10/2018 at 1:47 PM  Between 7am to 6pm - Pager - [phone]  After 6pm go to [email or web address] - password [email or web address] [facility]  Office  [phone]  CC: Primary care physician; [name], MD

## 2018-02-11 ENCOUNTER — Inpatient Hospital Stay: Admit: 2018-02-11 | Payer: Medicare Other

## 2018-02-11 ENCOUNTER — Encounter
Admission: EM | Disposition: A | Discharge status: Home Health | Payer: Self-pay | Source: Home / Self Care | Attending: Internal Medicine

## 2018-02-11 ENCOUNTER — Encounter: Payer: Self-pay | Admitting: Anesthesiology

## 2018-02-11 ENCOUNTER — Inpatient Hospital Stay: Payer: Medicare Other | Admitting: Anesthesiology

## 2018-02-11 HISTORY — PX: INCISION AND DRAINAGE: SHX5863

## 2018-02-11 LAB — CBC
HCT: 27.1 % — ABNORMAL LOW (ref 35.0–47.0)
Hemoglobin: 9 g/dL — ABNORMAL LOW (ref 12.0–16.0)
MCH: 29.2 pg (ref 26.0–34.0)
MCHC: 33.1 g/dL (ref 32.0–36.0)
MCV: 88.3 fL (ref 80.0–100.0)
Platelets: 66 10*3/uL — ABNORMAL LOW (ref 150–440)
RBC: 3.07 MIL/uL — ABNORMAL LOW (ref 3.80–5.20)
RDW: 16.4 % — ABNORMAL HIGH (ref 11.5–14.5)
WBC: 25.1 10*3/uL — ABNORMAL HIGH (ref 3.6–11.0)

## 2018-02-11 LAB — HEPATIC FUNCTION PANEL
ALT: 90 U/L — ABNORMAL HIGH (ref 14–54)
AST: 68 U/L — ABNORMAL HIGH (ref 15–41)
Albumin: 2.7 g/dL — ABNORMAL LOW (ref 3.5–5.0)
Alkaline Phosphatase: 142 U/L — ABNORMAL HIGH (ref 38–126)
Bilirubin, Direct: 0.2 mg/dL (ref 0.1–0.5)
Indirect Bilirubin: 0.6 mg/dL (ref 0.3–0.9)
Total Bilirubin: 0.8 mg/dL (ref 0.3–1.2)
Total Protein: 6.7 g/dL (ref 6.5–8.1)

## 2018-02-11 LAB — BLOOD CULTURE ID PANEL (REFLEXED)

## 2018-02-11 LAB — GLUCOSE, CAPILLARY
Glucose-Capillary: 150 mg/dL — ABNORMAL HIGH (ref 65–99)
Glucose-Capillary: 159 mg/dL — ABNORMAL HIGH (ref 65–99)
Glucose-Capillary: 93 mg/dL (ref 65–99)
Glucose-Capillary: 117 mg/dL — ABNORMAL HIGH (ref 65–99)

## 2018-02-11 LAB — BASIC METABOLIC PANEL
Anion gap: 8 (ref 5–15)
BUN: 18 mg/dL (ref 6–20)
CO2: 22 mmol/L (ref 22–32)
Calcium: 8 mg/dL — ABNORMAL LOW (ref 8.9–10.3)
Chloride: 97 mmol/L — ABNORMAL LOW (ref 101–111)
Creatinine, Ser: 0.6 mg/dL (ref 0.44–1.00)
GFR calc Af Amer: >60 mL/min (ref >60)
GFR calc non Af Amer: >60 mL/min (ref >60)
Glucose, Bld: 152 mg/dL — ABNORMAL HIGH (ref 65–99)
Potassium: 3.4 mmol/L — ABNORMAL LOW (ref 3.5–5.1)
Sodium: 127 mmol/L — ABNORMAL LOW (ref 135–145)

## 2018-02-11 SURGERY — INCISION AND DRAINAGE
Anesthesia: General | Site: Hand | Laterality: Right | Wound class: Dirty or Infected

## 2018-02-11 MED ORDER — LIDOCAINE HCL (PF) 2 % IJ SOLN
INTRAMUSCULAR | Status: AC
  Filled 2018-02-11: qty 10

## 2018-02-11 MED ORDER — ONDANSETRON HCL 4 MG/2ML IJ SOLN
INTRAMUSCULAR | Status: DC | PRN
  Administered 2018-02-11: 4 mg via INTRAVENOUS

## 2018-02-11 MED ORDER — METOCLOPRAMIDE HCL 10 MG PO TABS
5.0000 mg | ORAL_TABLET | Freq: Three times a day (TID) | ORAL | Status: DC | PRN
  Filled 2018-02-11: qty 1

## 2018-02-11 MED ORDER — OXYCODONE-ACETAMINOPHEN 5-325 MG PO TABS
1.0000 | ORAL_TABLET | Freq: Four times a day (QID) | ORAL | Status: DC | PRN
  Administered 2018-02-12 – 2018-02-13 (×3): 1 via ORAL
  Filled 2018-02-11 (×5): qty 1

## 2018-02-11 MED ORDER — NEOMYCIN-POLYMYXIN B GU 40-200000 IR SOLN
Status: AC
  Filled 2018-02-11: qty 20

## 2018-02-11 MED ORDER — FENTANYL CITRATE (PF) 100 MCG/2ML IJ SOLN
INTRAMUSCULAR | Status: DC | PRN
  Administered 2018-02-11 (×2): 50 ug via INTRAVENOUS

## 2018-02-11 MED ORDER — DEXAMETHASONE SODIUM PHOSPHATE 10 MG/ML IJ SOLN
INTRAMUSCULAR | Status: DC | PRN
  Administered 2018-02-11: 5 mg via INTRAVENOUS

## 2018-02-11 MED ORDER — PROPOFOL 10 MG/ML IV BOLUS
INTRAVENOUS | Status: DC | PRN
  Administered 2018-02-11: 100 mg via INTRAVENOUS

## 2018-02-11 MED ORDER — CEFAZOLIN SODIUM-DEXTROSE 2-4 GM/100ML-% IV SOLN
2.0000 g | Freq: Three times a day (TID) | INTRAVENOUS | Status: DC
  Administered 2018-02-11 – 2018-02-13 (×8): 2 g via INTRAVENOUS
  Filled 2018-02-11 (×13): qty 100

## 2018-02-11 MED ORDER — IPRATROPIUM-ALBUTEROL 0.5-2.5 (3) MG/3ML IN SOLN
3.0000 mL | Freq: Once | RESPIRATORY_TRACT | Status: AC
  Administered 2018-02-11: 3 mL via RESPIRATORY_TRACT

## 2018-02-11 MED ORDER — HYDROMORPHONE HCL 1 MG/ML IJ SOLN: 0.2500 mg | INTRAMUSCULAR | Status: DC | PRN

## 2018-02-11 MED ORDER — ONDANSETRON HCL 4 MG/2ML IJ SOLN
INTRAMUSCULAR | Status: AC
  Filled 2018-02-11: qty 2

## 2018-02-11 MED ORDER — FENTANYL CITRATE (PF) 100 MCG/2ML IJ SOLN
25.0000 ug | INTRAMUSCULAR | Status: DC | PRN
  Administered 2018-02-11 (×2): 25 ug via INTRAVENOUS

## 2018-02-11 MED ORDER — DOCUSATE SODIUM 100 MG PO CAPS
100.0000 mg | ORAL_CAPSULE | Freq: Two times a day (BID) | ORAL | Status: DC
  Administered 2018-02-11 – 2018-02-13 (×4): 100 mg via ORAL
  Filled 2018-02-11 (×4): qty 1

## 2018-02-11 MED ORDER — FENTANYL CITRATE (PF) 100 MCG/2ML IJ SOLN
INTRAMUSCULAR | Status: AC
  Administered 2018-02-11: 25 ug via INTRAVENOUS
  Filled 2018-02-11: qty 2

## 2018-02-11 MED ORDER — LIDOCAINE HCL (PF) 2 % IJ SOLN
INTRAMUSCULAR | Status: DC | PRN
  Administered 2018-02-11: 50 mg

## 2018-02-11 MED ORDER — SENNOSIDES-DOCUSATE SODIUM 8.6-50 MG PO TABS: 1.0000 | ORAL_TABLET | Freq: Every evening | ORAL | Status: DC | PRN

## 2018-02-11 MED ORDER — BISACODYL 5 MG PO TBEC: 5.0000 mg | DELAYED_RELEASE_TABLET | Freq: Every day | ORAL | Status: DC | PRN

## 2018-02-11 MED ORDER — NEOMYCIN-POLYMYXIN B GU 40-200000 IR SOLN
Status: DC | PRN
  Administered 2018-02-11: 6 mL

## 2018-02-11 MED ORDER — MAGNESIUM CITRATE PO SOLN
1.0000 | Freq: Once | ORAL | Status: DC | PRN
  Filled 2018-02-11 (×4): qty 296

## 2018-02-11 MED ORDER — FENTANYL CITRATE (PF) 100 MCG/2ML IJ SOLN
INTRAMUSCULAR | Status: AC
  Filled 2018-02-11: qty 2

## 2018-02-11 MED ORDER — DEXAMETHASONE SODIUM PHOSPHATE 10 MG/ML IJ SOLN
INTRAMUSCULAR | Status: AC
  Filled 2018-02-11: qty 1

## 2018-02-11 MED ORDER — PROPOFOL 10 MG/ML IV BOLUS
INTRAVENOUS | Status: AC
  Filled 2018-02-11: qty 20

## 2018-02-11 MED ORDER — METOCLOPRAMIDE HCL 5 MG/ML IJ SOLN: 5.0000 mg | Freq: Three times a day (TID) | INTRAMUSCULAR | Status: DC | PRN

## 2018-02-11 SURGICAL SUPPLY — 25 items
BANDAGE ELASTIC 3 LF NS (GAUZE/BANDAGES/DRESSINGS) ×2 IMPLANT
CANISTER SUCT 1200ML W/VALVE (MISCELLANEOUS) ×2 IMPLANT
CAST PADDING 3X4FT ST 30246 (SOFTGOODS) ×1
CHLORAPREP W/TINT 26ML (MISCELLANEOUS) ×2 IMPLANT
CUFF TOURN 18 STER (MISCELLANEOUS) ×2 IMPLANT
CUFF TOURN 24 STER (MISCELLANEOUS) IMPLANT
DRAIN PENROSE 1/4X12 LTX (DRAIN) ×4 IMPLANT
DRSG GAUZE FLUFF 36X18 (GAUZE/BANDAGES/DRESSINGS) ×2 IMPLANT
ELECT REM PT RETURN 9FT ADLT (ELECTROSURGICAL) ×2
ELECTRODE REM PT RTRN 9FT ADLT (ELECTROSURGICAL) ×1 IMPLANT
GAUZE PETRO XEROFOAM 1X8 (MISCELLANEOUS) ×2 IMPLANT
GAUZE SPONGE 4X4 12PLY STRL (GAUZE/BANDAGES/DRESSINGS) ×2 IMPLANT
GLOVE SURG SYN 9.0  PF PI (GLOVE) ×1
GLOVE SURG SYN 9.0 PF PI (GLOVE) ×1 IMPLANT
GOWN SRG 2XL LVL 4 RGLN SLV (GOWNS) ×1 IMPLANT
GOWN STRL NON-REIN 2XL LVL4 (GOWNS) ×1
GOWN STRL REUS W/ TWL LRG LVL3 (GOWN DISPOSABLE) ×1 IMPLANT
GOWN STRL REUS W/TWL LRG LVL3 (GOWN DISPOSABLE) ×1
IV CATH ANGIO 14GX3.25 ORG (MISCELLANEOUS) ×2 IMPLANT
KIT TURNOVER KIT A (KITS) ×2 IMPLANT
NS IRRIG 500ML POUR BTL (IV SOLUTION) ×2 IMPLANT
PACK EXTREMITY ARMC (MISCELLANEOUS) ×2 IMPLANT
PAD CAST CTTN 3X4 STRL (SOFTGOODS) ×1 IMPLANT
PAD PREP 24X41 OB/GYN DISP (PERSONAL CARE ITEMS) ×2 IMPLANT
SUT ETHILON 4 0 P 3 18 (SUTURE) ×2 IMPLANT

## 2018-02-11 NOTE — Consult Note (Signed)
[facility] Infectious Disease     Reason for Consult:MSSA bacteremia, wrist swelling    Referring Physician: Neta Mends Date of Admission:  02/10/2018   Active Problems:   Cellulitis   HPI: Julie Holland is a 77 y.o. female with a history of arthritis, ITP, perianal carcinoma in situ, chronic kidney disease stage I, diabetes mellitus type 2, GERD, glaucoma, hemorrhoids, hyperlipidemia, hypertension admitted with fevers, pain and swelling in the right wrist after a fall one week prior. She had been seen in ED 6/2 a few days after the fall and had neg xray, no fever and was treated with naproxen.  On this admission wbc was 29 and temp 101.  She has [place] + MSSA. Seen by ortho, MRI wrist with 4.1 cm abscess, possible early osteo and tenosynovitis.  She has been started on iv ancef and wbc down to 25.     Past Medical History:  Diagnosis Date  . Arthritis   . Asthma   . Cancer ([place]) 03/2015   In situ carcinoma of the perianal skin, incidental finding at hemorrhoidectomy.  . Cataract   . Chronic kidney disease    stage 1  . Diabetes mellitus    type II  . Dry eye of right side   . GERD (gastroesophageal reflux disease)    OCC  . Glaucoma 2018   RIGHT EYE   . Hemorrhoids   . Hyperlipidemia   . Hypertension    Past Surgical History:  Procedure Laterality Date  . CATARACT EXTRACTION, BILATERAL    . CHOLECYSTECTOMY    . COLONOSCOPY  02/13/13   Dr Bary Castilla  . EPIGASTRIC HERNIA REPAIR N/A 03/20/2015   Procedure: HERNIA REPAIR EPIGASTRIC ADULT;  Surgeon: Robert Bellow, MD;  Location: [facility] ORS;  Service: General;  Laterality: N/A;  . HEMORRHOID SURGERY N/A 03/20/2015    FOCAL HIGH-GRADE SQUAMOUS INTRAEPITHELIAL LESION (HSIL, ANAL /HEMORRHOIDECTOMY;   Robert Bellow, MD [facility] ORS;  : General;  Laterality: N/A;  . HERNIA REPAIR  July 2016   Epigastric hernia, primary repair  . TONSILLECTOMY  age 52  . TOTAL ABDOMINAL HYSTERECTOMY  01/1989  . TUBAL LIGATION    . 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  Case has been presented at the [facility] tumor board. No indication for additional treatment outside of serial exams  Surgeon: Robert Bellow, MD;  Location: [facility] ORS;  Service: General;  Laterality: N/A;   Social History   Tobacco Use  . Smoking status: Never Smoker  . Smokeless tobacco: Never Used  . Tobacco comment: smoking cessation materials not required  Substance Use Topics  . Alcohol use: No  . Drug use: No   Family History  Problem Relation Age of Onset  . Bladder Cancer Mother   . Colon cancer Father   . Lung cancer Brother   . Lymphoma Sister   . Breast cancer Neg Hx     Allergies:  Allergies  Allergen Reactions  . Hydrocodone Nausea And Vomiting and Other (See Comments)    Vertigo but patient takes hydrocodone/acetaminophen outpatient    Current antibiotics: Antibiotics Given (last 72 hours)    Date/Time Action Medication Dose Rate   02/10/18 1215 New Bag/Given   vancomycin (VANCOCIN) IVPB 1000 mg/200 mL premix 1,000 mg 200 mL/hr   02/11/18 0101 New Bag/Given   vancomycin (VANCOCIN) IVPB 750 mg/150 ml premix 750 mg 150 mL/hr  02/11/18 0300 New Bag/Given   ceFAZolin (ANCEF) IVPB 2g/100 mL premix 2 g 200 mL/hr      MEDICATIONS: . aspirin  81 mg Oral Daily  . carvedilol  6.25 mg Oral BID WC  . insulin aspart  0-15 Units Subcutaneous TID WC  . insulin aspart  0-5 Units Subcutaneous QHS  . latanoprost  1 drop Right Eye QHS  . lisinopril  10 mg Oral Daily  . metFORMIN  850 mg Oral BID WC  . mometasone-formoterol  2 puff Inhalation BID  . multivitamin with minerals  2 tablet Oral Daily    Review of Systems - 11 systems reviewed and negative per HPI   OBJECTIVE: Temp:  [98.4 F (36.9 C)-101 F (38.3 C)] 99.1 F (37.3 C) (06/06 0400) Pulse Rate:  [86-107] 92 (06/06 0400) Resp:  [16-34] 17 (06/06 0400) BP: (125-157)/(63-76) 136/71 (06/06  0400) SpO2:  [95 %-100 %] 95 % (06/06 0400) Weight:  [53.5 kg (118 lb)] 53.5 kg (118 lb) (06/05 1028) Physical Exam  Constitutional:  oriented to person, place, and time.Frail HENT: [place]/AT, PERRLA, no scleral icterus Mouth/Throat: Oropharynx is clear and moist. No oropharyngeal exudate.  Cardiovascular: Normal rate, regular rhythm and normal heart sounds.  Pulmonary/Chest: Effort normal and breath sounds normal. No respiratory distress.  has no wheezes.  Neck = supple, no nuchal rigidity Abdominal: Soft. Bowel sounds are normal.  exhibits no distension. There is no tenderness.  Lymphadenopathy: no cervical adenopathy. No axillary adenopathy Ext R wrist with marked swelling and brusing. ttp  Neurological: alert and oriented to person, place, and time.  Skin: Skin is warm and dry. No rash noted. No erythema.  Psychiatric: a normal mood and affect.  behavior is normal.    LABS: Results for orders placed or performed during the hospital encounter of 02/10/18 (from the past 48 hour(s))  CBC with Differential     Status: Abnormal   Collection Time: 02/10/18 10:33 AM  Result Value Ref Range   WBC 29.1 (H) 3.6 - 11.0 K/uL   RBC 3.23 (L) 3.80 - 5.20 MIL/uL   Hemoglobin 9.4 (L) 12.0 - 16.0 g/dL   HCT 28.8 (L) 35.0 - 47.0 %   MCV 89.2 80.0 - 100.0 fL   MCH 29.0 26.0 - 34.0 pg   MCHC 32.5 32.0 - 36.0 g/dL   RDW 16.3 (H) 11.5 - 14.5 %   Platelets 67 (L) 150 - 440 K/uL    Comment: PLATELET CLUMPS NOTED ON SMEAR, COUNT APPEARS ADEQUATE   Neutrophils Relative % 70 %   Lymphocytes Relative 1 %   Monocytes Relative 23 %   Eosinophils Relative 3 %   Basophils Relative 0 %   Band Neutrophils 3 %   Metamyelocytes Relative 0 %   Myelocytes 0 %   Promyelocytes Relative 0 %   Blasts 0 %   nRBC 0 0 /100 WBC   Other 0 %   Neutro Abs 21.2 (H) 1.4 - 6.5 K/uL   Lymphs Abs 0.3 (L) 1.0 - 3.6 K/uL   Monocytes Absolute 6.7 (H) 0.2 - 0.9 K/uL   Eosinophils Absolute 0.9 (H) 0 - 0.7 K/uL   Basophils  Absolute 0.0 0 - 0.1 K/uL   Smear Review MORPHOLOGY UNREMARKABLE     Comment: Performed at [facility], [address] [place], [place] [address]  Comprehensive metabolic panel     Status: Abnormal   Collection Time: 02/10/18 10:33 AM  Result Value Ref Range   Sodium 126 (L) 135 -  145 mmol/L   Potassium 3.2 (L) 3.5 - 5.1 mmol/L   Chloride 92 (L) 101 - 111 mmol/L   CO2 24 22 - 32 mmol/L   Glucose, Bld 276 (H) 65 - 99 mg/dL   BUN 25 (H) 6 - 20 mg/dL   Creatinine, Ser 0.83 0.44 - 1.00 mg/dL   Calcium 8.2 (L) 8.9 - 10.3 mg/dL   Total Protein 6.8 6.5 - 8.1 g/dL   Albumin 2.8 (L) 3.5 - 5.0 g/dL   AST 109 (H) 15 - 41 U/L   ALT 112 (H) 14 - 54 U/L   Alkaline Phosphatase 144 (H) 38 - 126 U/L   Total Bilirubin 0.7 0.3 - 1.2 mg/dL   GFR calc non Af Amer >60 >60 mL/min   GFR calc Af Amer >60 >60 mL/min    Comment: (NOTE) The eGFR has been calculated using the CKD EPI equation. This calculation has not been validated in all clinical situations. eGFR's persistently <60 mL/min signify possible Chronic Kidney Disease.    Anion gap 10 5 - 15    Comment: Performed at [facility], [address] [place], [place] [address]  Troponin I     Status: None   Collection Time: 02/10/18 10:33 AM  Result Value Ref Range   Troponin I <0.03 <0.03 ng/mL    Comment: Performed at [facility], [address] [place], [place] [address]  Glucose, capillary     Status: Abnormal   Collection Time: 02/10/18 10:57 AM  Result Value Ref Range   Glucose-Capillary 268 (H) 65 - 99 mg/dL  Lactic acid, plasma     Status: Abnormal   Collection Time: 02/10/18 11:22 AM  Result Value Ref Range   Lactic Acid, Venous 2.4 (HH) 0.5 - 1.9 mmol/L    Comment: CRITICAL RESULT CALLED TO, READ BACK BY AND VERIFIED WITH ALISHA GRANGER AT 1204 ON  02/10/2018 JJB Performed at [facility], [address] [place], [place] [address]   Blood culture (routine x 2)     Status: None (Preliminary  result)   Collection Time: 02/10/18 11:22 AM  Result Value Ref Range   Specimen Description BLOOD BLOOD LEFT FOREARM    Special Requests      BOTTLES DRAWN AEROBIC AND ANAEROBIC Blood Culture results may not be optimal due to an excessive volume of blood received in culture bottles   Culture  Setup Time      GRAM POSITIVE COCCI IN BOTH AEROBIC AND ANAEROBIC BOTTLES CRITICAL VALUE NOTED.  VALUE IS CONSISTENT WITH PREVIOUSLY REPORTED AND CALLED VALUE. Performed at [facility], [address] [place], [place] [address]    Culture GRAM POSITIVE COCCI    Report Status PENDING   Blood culture (routine x 2)     Status: None (Preliminary result)   Collection Time: 02/10/18 11:22 AM  Result Value Ref Range   Specimen Description      BLOOD RIGHT ANTECUBITAL Performed at [facility], [address] [place], [place] [address]    Special Requests      BOTTLES DRAWN AEROBIC AND ANAEROBIC Blood Culture results may not be optimal due to an inadequate volume of blood received in culture bottles Performed at [facility], [address] [place], [place] [address]    Culture  Setup Time      GRAM POSITIVE COCCI IN BOTH AEROBIC AND ANAEROBIC BOTTLES CRITICAL RESULT CALLED TO, READ BACK BY AND VERIFIED WITH: Talley Casco BESANTI ON 02/11/18 AT 0117 JAG  Performed at [facility], [place] [address], [place], [place] [address]    Culture GRAM POSITIVE COCCI    Report Status PENDING   Blood Culture ID Panel (Reflexed)     Status: Abnormal   Collection Time: 02/10/18 11:22 AM  Result Value Ref Range   Enterococcus species NOT DETECTED NOT DETECTED   Listeria monocytogenes NOT DETECTED NOT DETECTED   Staphylococcus species DETECTED (A) NOT DETECTED    Comment: CRITICAL RESULT CALLED TO, READ BACK BY AND VERIFIED WITH: Modupe Shampine BESANTI ON 02/11/18 AT 0117 JAG    Staphylococcus aureus DETECTED (A) NOT DETECTED    Comment: Methicillin (oxacillin) susceptible Staphylococcus  aureus (MSSA). Preferred therapy is anti staphylococcal beta lactam antibiotic (Cefazolin or Nafcillin), unless clinically contraindicated. CRITICAL RESULT CALLED TO, READ BACK BY AND VERIFIED WITH: Soliyana Mcchristian BESANTI ON 02/11/18 AT 0117  JAG    Methicillin resistance NOT DETECTED NOT DETECTED   Streptococcus species NOT DETECTED NOT DETECTED   Streptococcus agalactiae NOT DETECTED NOT DETECTED   Streptococcus pneumoniae NOT DETECTED NOT DETECTED   Streptococcus pyogenes NOT DETECTED NOT DETECTED   Acinetobacter baumannii NOT DETECTED NOT DETECTED   Enterobacteriaceae species NOT DETECTED NOT DETECTED   Enterobacter cloacae complex NOT DETECTED NOT DETECTED   Escherichia coli NOT DETECTED NOT DETECTED   Klebsiella oxytoca NOT DETECTED NOT DETECTED   Klebsiella pneumoniae NOT DETECTED NOT DETECTED   Proteus species NOT DETECTED NOT DETECTED   Serratia marcescens NOT DETECTED NOT DETECTED   Haemophilus influenzae NOT DETECTED NOT DETECTED   Neisseria meningitidis NOT DETECTED NOT DETECTED   Pseudomonas aeruginosa NOT DETECTED NOT DETECTED   Candida albicans NOT DETECTED NOT DETECTED   Candida glabrata NOT DETECTED NOT DETECTED   Candida krusei NOT DETECTED NOT DETECTED   Candida parapsilosis NOT DETECTED NOT DETECTED   Candida tropicalis NOT DETECTED NOT DETECTED    Comment: Performed at [facility], [address] [place], [place] [address]  Glucose, capillary     Status: Abnormal   Collection Time: 02/10/18  4:45 PM  Result Value Ref Range   Glucose-Capillary 204 (H) 65 - 99 mg/dL  Lactic acid, plasma     Status: None   Collection Time: 02/10/18  5:00 PM  Result Value Ref Range   Lactic Acid, Venous 1.5 0.5 - 1.9 mmol/L    Comment: Performed at [facility], [address] [place], [place] [address]  Glucose, capillary     Status: Abnormal   Collection Time: 02/10/18  8:42 PM  Result Value Ref Range   Glucose-Capillary 197 (H) 65 - 99 mg/dL  Urinalysis,  Complete w Microscopic     Status: Abnormal   Collection Time: 02/10/18  8:55 PM  Result Value Ref Range   Color, Urine AMBER (A) YELLOW    Comment: BIOCHEMICALS MAY BE AFFECTED BY COLOR   APPearance CLOUDY (A) CLEAR   Specific Gravity, Urine 1.022 1.005 - 1.030   pH 5.0 5.0 - 8.0   Glucose, UA >=500 (A) NEGATIVE mg/dL   Hgb urine dipstick NEGATIVE NEGATIVE   Bilirubin Urine NEGATIVE NEGATIVE   Ketones, ur NEGATIVE NEGATIVE mg/dL   Protein, ur 100 (A) NEGATIVE mg/dL   Nitrite NEGATIVE NEGATIVE   Leukocytes, UA NEGATIVE NEGATIVE   RBC / HPF 11-20 0 - 5 RBC/hpf   WBC, UA 6-10 0 - 5 WBC/hpf   Bacteria, UA FEW (A) NONE SEEN   Squamous Epithelial / LPF 0-5 0 - 5   Mucus PRESENT  Hyaline Casts, UA PRESENT     Comment: Performed at [facility], [address] [place], [place] [address]  CBC     Status: Abnormal   Collection Time: 02/11/18  6:46 AM  Result Value Ref Range   WBC 25.1 (H) 3.6 - 11.0 K/uL   RBC 3.07 (L) 3.80 - 5.20 MIL/uL   Hemoglobin 9.0 (L) 12.0 - 16.0 g/dL   HCT 27.1 (L) 35.0 - 47.0 %   MCV 88.3 80.0 - 100.0 fL   MCH 29.2 26.0 - 34.0 pg   MCHC 33.1 32.0 - 36.0 g/dL   RDW 16.4 (H) 11.5 - 14.5 %   Platelets 66 (L) 150 - 440 K/uL    Comment: Performed at [facility], [address] [place], [place] [address]  Basic metabolic panel     Status: Abnormal   Collection Time: 02/11/18  6:46 AM  Result Value Ref Range   Sodium 127 (L) 135 - 145 mmol/L   Potassium 3.4 (L) 3.5 - 5.1 mmol/L   Chloride 97 (L) 101 - 111 mmol/L   CO2 22 22 - 32 mmol/L   Glucose, Bld 152 (H) 65 - 99 mg/dL   BUN 18 6 - 20 mg/dL   Creatinine, Ser 0.60 0.44 - 1.00 mg/dL   Calcium 8.0 (L) 8.9 - 10.3 mg/dL   GFR calc non Af Amer >60 >60 mL/min   GFR calc Af Amer >60 >60 mL/min    Comment: (NOTE) The eGFR has been calculated using the CKD EPI equation. This calculation has not been validated in all clinical situations. eGFR's persistently <60 mL/min signify possible  Chronic Kidney Disease.    Anion gap 8 5 - 15    Comment: Performed at [facility], [address] [place], [place] [address]  Hepatic function panel     Status: Abnormal   Collection Time: 02/11/18  6:46 AM  Result Value Ref Range   Total Protein 6.7 6.5 - 8.1 g/dL   Albumin 2.7 (L) 3.5 - 5.0 g/dL   AST 68 (H) 15 - 41 U/L   ALT 90 (H) 14 - 54 U/L   Alkaline Phosphatase 142 (H) 38 - 126 U/L   Total Bilirubin 0.8 0.3 - 1.2 mg/dL   Bilirubin, Direct 0.2 0.1 - 0.5 mg/dL   Indirect Bilirubin 0.6 0.3 - 0.9 mg/dL    Comment: Performed at [facility], [address] [place], [place] [address]  Glucose, capillary     Status: Abnormal   Collection Time: 02/11/18  7:42 AM  Result Value Ref Range   Glucose-Capillary 150 (H) 65 - 99 mg/dL   No components found for: ESR, C REACTIVE PROTEIN MICRO: Recent Results (from the past 720 hour(s))  Blood culture (routine x 2)     Status: None (Preliminary result)   Collection Time: 02/10/18 11:22 AM  Result Value Ref Range Status   Specimen Description BLOOD BLOOD LEFT FOREARM  Final   Special Requests   Final    BOTTLES DRAWN AEROBIC AND ANAEROBIC Blood Culture results may not be optimal due to an excessive volume of blood received in culture bottles   Culture  Setup Time   Final    GRAM POSITIVE COCCI IN BOTH AEROBIC AND ANAEROBIC BOTTLES CRITICAL VALUE NOTED.  VALUE IS CONSISTENT WITH PREVIOUSLY REPORTED AND CALLED VALUE. Performed at [facility], [address] [place], [place] [address]    Culture [facility] POSITIVE COCCI  Final   Report Status PENDING  Incomplete  Blood culture (routine x 2)     Status: None (Preliminary result)   Collection Time: 02/10/18 11:22 AM  Result Value Ref Range Status   Specimen Description   Final    BLOOD RIGHT ANTECUBITAL Performed at [facility], [address] [place], [place] [address]    Special Requests   Final    BOTTLES DRAWN AEROBIC AND ANAEROBIC Blood  Culture results may not be optimal due to an inadequate volume of blood received in culture bottles Performed at [facility], [address], [place], [place] [address]    Culture  Setup Time   Final    GRAM POSITIVE COCCI IN BOTH AEROBIC AND ANAEROBIC BOTTLES CRITICAL RESULT CALLED TO, READ BACK BY AND VERIFIED WITH: Loranzo Desha BESANTI ON 02/11/18 AT 0117 JAG Performed at [facility], [address] [place], [place] [address]    Culture GRAM POSITIVE COCCI  Final   Report Status PENDING  Incomplete  Blood Culture ID Panel (Reflexed)     Status: Abnormal   Collection Time: 02/10/18 11:22 AM  Result Value Ref Range Status   Enterococcus species NOT DETECTED NOT DETECTED Final   Listeria monocytogenes NOT DETECTED NOT DETECTED Final   Staphylococcus species DETECTED (A) NOT DETECTED Final    Comment: CRITICAL RESULT CALLED TO, READ BACK BY AND VERIFIED WITH: Seon Gaertner BESANTI ON 02/11/18 AT 0117 JAG    Staphylococcus aureus DETECTED (A) NOT DETECTED Final    Comment: Methicillin (oxacillin) susceptible Staphylococcus aureus (MSSA). Preferred therapy is anti staphylococcal beta lactam antibiotic (Cefazolin or Nafcillin), unless clinically contraindicated. CRITICAL RESULT CALLED TO, READ BACK BY AND VERIFIED WITH: Dezarai Prew BESANTI ON 02/11/18 AT 0117  JAG    Methicillin resistance NOT DETECTED NOT DETECTED Final   Streptococcus species NOT DETECTED NOT DETECTED Final   Streptococcus agalactiae NOT DETECTED NOT DETECTED Final   Streptococcus pneumoniae NOT DETECTED NOT DETECTED Final   Streptococcus pyogenes NOT DETECTED NOT DETECTED Final   Acinetobacter baumannii NOT DETECTED NOT DETECTED Final   Enterobacteriaceae species NOT DETECTED NOT DETECTED Final   Enterobacter cloacae complex NOT DETECTED NOT DETECTED Final   Escherichia coli NOT DETECTED NOT DETECTED Final   Klebsiella oxytoca NOT DETECTED NOT DETECTED Final   Klebsiella pneumoniae NOT DETECTED NOT DETECTED Final    Proteus species NOT DETECTED NOT DETECTED Final   Serratia marcescens NOT DETECTED NOT DETECTED Final   Haemophilus influenzae NOT DETECTED NOT DETECTED Final   Neisseria meningitidis NOT DETECTED NOT DETECTED Final   Pseudomonas aeruginosa NOT DETECTED NOT DETECTED Final   Candida albicans NOT DETECTED NOT DETECTED Final   Candida glabrata NOT DETECTED NOT DETECTED Final   Candida krusei NOT DETECTED NOT DETECTED Final   Candida parapsilosis NOT DETECTED NOT DETECTED Final   Candida tropicalis NOT DETECTED NOT DETECTED Final    Comment: Performed at [facility], [address] [place], [place] [address]    IMAGING: Dg Wrist Complete Right  Result Date: 02/10/2018 CLINICAL DATA:  Acute RIGHT wrist pain following fall 1 week ago. Initial encounter. EXAM: RIGHT WRIST - COMPLETE 3+ VIEW COMPARISON:  02/07/2018 FINDINGS: No acute fracture, subluxation or dislocation identified. Dorsal soft tissue swelling noted. Chondrocalcinosis again identified. Degenerative changes at the first carpometacarpal joint again noted. IMPRESSION: Soft tissue swelling without acute bony abnormality. Degenerative changes and chondrocalcinosis. Electronically Signed   By: Margarette Canada M.D.   On: 02/10/2018 12:14   Mr Wrist Right W Wo Contrast  Result Date: 02/10/2018  CLINICAL DATA:  Diffuse right wrist and hand swelling since fall a week ago. Concern for cellulitis with possible abscess. EXAM: MR OF THE RIGHT WRIST WITHOUT AND WITH CONTRAST TECHNIQUE: Multiplanar multisequence MR imaging of the right wrist was performed both before and after the administration of intravenous contrast. CONTRAST:  36m MULTIHANCE GADOBENATE DIMEGLUMINE 529 MG/ML IV SOLN COMPARISON:  Right hand x-rays from same day. FINDINGS: Despite efforts by the technologist and patient, motion artifact is present on today's exam and could not be eliminated. This reduces exam sensitivity and specificity. Ligaments: Intact scapholunate and  lunotriquetral ligaments. Triangular fibrocartilage: Possible small central perforation of the TFCC articular disc. Tendons: Intact flexor and extensor compartment tendons. Mild enhancing tenosynovitis involving the extensor carpi radialis longus and brevis tendons. Carpal tunnel/median nerve: Normal carpal tunnel. Normal median nerve. Guyon's canal: Normal. Joint/cartilage: Small radiocarpal, midcarpal, and distal radioulnar joint effusions with enhancing synovitis. Large first CHiltonjoint effusion. Bones/carpal alignment: Small scattered erosions involving the carpal bones and at the base of the first metacarpal. Subluxation and widening of the first CAscension Genesys Hospitaljoint. Second and third MCP joint osteoarthritis. Normal carpal alignment. No fracture. Other: There is a large rim enhancing fluid collection centered around the first CVal[facility] Phys[facility] Surgery [facility] LLCjoint this is difficult to measure, but measures at least 3.3 x 3.2 x 4.1 cm (AP by transverse by CC). The collection extends primarily into the volar soft tissues adjacent to the first metacarpal. There is diffuse soft tissue swelling about the dorsal and radial wrist. IMPRESSION: 1. Large rim enhancing fluid collection centered around the first CAb[facility] Memorial Hospitaljoint, measuring up to 4.1 cm, consistent with abscess. Large first CQuincyjoint effusion, suspicious for septic arthritis. 2. Small radiocarpal, midcarpal, and distal radioulnar joint effusions with enhancing synovitis. Small scattered erosions involving the carpal bones. While inflammatory arthropathy could have this appearance, given the findings at the first CBothwell Regional Health Centerjoint, septic arthritis cannot be excluded. 3. Mild enhancing tenosynovitis involving the second extensor compartment, likely infectious in etiology. 4. Possible small central perforation of the TFCC articular disc. Electronically Signed   By: WTitus DubinM.D.   On: 02/10/2018 23:24   Dg Hand Complete Right  Result Date: 02/10/2018 CLINICAL DATA:  Acute RIGHT hand pain following fall  1 week ago. Initial encounter. EXAM: RIGHT HAND - COMPLETE 3+ VIEW COMPARISON:  02/07/2018 and 10/08/2015 FINDINGS: There is no evidence of acute fracture, subluxation or dislocation. Dorsal soft tissue swelling identified. Wrist chondrocalcinosis noted. Degenerative changes at the index and middle finger PIP joints and first carpometacarpal joint noted. IMPRESSION: Soft tissue swelling without acute bony abnormality. Electronically Signed   By: JMargarette CanadaM.D.   On: 02/10/2018 12:16   Dg Hand Complete Right  Result Date: 02/07/2018 CLINICAL DATA:  Status post fall 3 days ago with continued right hand pain and swelling. Initial encounter. EXAM: RIGHT HAND - COMPLETE 3+ VIEW COMPARISON:  None. FINDINGS: No acute bony or joint abnormality is identified. Advanced first CWoodlakeosteoarthritis is identified. Scattered degenerative change about the PIP and DIP joints and IP joint of the thumb also noted. There is also joint space narrowing at the second and third MCP joints with subchondral cyst formation and mild sclerosis. Chondrocalcinosis is noted. Soft tissues about the wrist appear swollen. IMPRESSION: Soft tissue swelling about the wrist. Negative for acute bony or joint abnormality. Scattered osteoarthritis. Degenerative disease at the second and third MCP joints may be due to CPPD arthropathy given chondrocalcinosis. Electronically Signed   By: TInge RiseM.D.  On: 02/07/2018 10:24   Mm Digital Screening Bilateral  Result Date: 01/13/2018 CLINICAL DATA:  Screening. EXAM: DIGITAL SCREENING BILATERAL MAMMOGRAM WITH CAD COMPARISON:  Previous exam(s). ACR Breast Density Category c: The breast tissue is heterogeneously dense, which may obscure small masses. FINDINGS: There are no findings suspicious for malignancy. Images were processed with CAD. IMPRESSION: No mammographic evidence of malignancy. A result letter of this screening mammogram will be mailed directly to the patient. RECOMMENDATION: Screening  mammogram in one year. (Code:SM-B-01Y) BI-RADS CATEGORY  1: Negative. Electronically Signed   By: Franki Cabot M.D.   On: 01/13/2018 16:07    Assessment:   RYLIE LIMBURG is a 77 y.o. female with MSSA bacteremia in setting of R wrist abscess after a  fall and injury. Agree with Dr Rudene Christians, likely a hematoma which became infected.  She has no intravascular devices, no prosthetic joints, no evidence metastatic infection at this point.  For surgery today. Discussed with pt and husband, need for surgery then evaluation for endocarditis and then prolonged IV abx course.  Recommendations Proceed with surgery. Repeat bcx Check echo Once BCX neg x 48 hours can place picc. Unlikely to need TEE since will plan on 4-6 week course IV abx for the septic arthiritis.  Thank you very much for allowing me to participate in the care of this patient. Please call with questions.   Cheral Marker. Ola Spurr, MD

## 2018-02-11 NOTE — Progress Notes (Signed)
[facility] [place] [facility] [place] NAME: Julie Holland    MR#:  [id number]  DATE OF BIRTH:  04-23-1941  SUBJECTIVE:   Came with pain, redness and swelling of right wrist after fall 1 week ago REVIEW OF SYSTEMS:   Review of Systems  Constitutional: Negative for chills, fever and weight loss.  HENT: Negative for ear discharge, ear pain and nosebleeds.   Eyes: Negative for blurred vision, pain and discharge.  Respiratory: Negative for sputum production, shortness of breath, wheezing and stridor.   Cardiovascular: Negative for chest pain, palpitations, orthopnea and PND.  Gastrointestinal: Negative for abdominal pain, diarrhea, nausea and vomiting.  Genitourinary: Negative for frequency and urgency.  Musculoskeletal: Positive for joint pain. Negative for back pain.  Neurological: Positive for weakness. Negative for sensory change, speech change and focal weakness.  Psychiatric/Behavioral: Negative for depression and hallucinations. The patient is not nervous/anxious.    Tolerating Diet:npo Tolerating PT: ambulatory  DRUG ALLERGIES:   Allergies  Allergen Reactions  . Hydrocodone Nausea And Vomiting and Other (See Comments)    Vertigo but patient takes hydrocodone/acetaminophen outpatient    VITALS:  Blood pressure (!) 158/76, pulse 90, temperature 100.1 F (37.8 C), resp. rate 20, height 5\' 5"  (1.651 m), weight 53.5 kg (118 lb), SpO2 100 %.  PHYSICAL EXAMINATION:   Physical Exam  GENERAL:  77 y.o.-year-old patient lying in the bed with no acute distress.  EYES: Pupils equal, round, reactive to light and accommodation. No scleral icterus. Extraocular muscles intact.  HEENT: Head atraumatic, normocephalic. Oropharynx and nasopharynx clear.  NECK:  Supple, no jugular venous distention. No thyroid enlargement, no tenderness.  LUNGS: Normal breath sounds bilaterally, no wheezing, rales, rhonchi. No use of accessory muscles of respiration.   CARDIOVASCULAR: S1, S2 normal. No murmurs, rubs, or gallops.  ABDOMEN: Soft, nontender, nondistended. Bowel sounds present. No organomegaly or mass.  EXTREMITIES:right wrist/hand swelling with redness and tenderness+ NEUROLOGIC: Cranial nerves II through XII are intact. No focal Motor or sensory deficits b/l.   PSYCHIATRIC:  patient is alert and oriented x 3.  SKIN: No obvious rash, lesion, or ulcer.   LABORATORY PANEL:  CBC Recent Labs  Lab 02/11/18 0646  WBC 25.1*  HGB 9.0*  HCT 27.1*  PLT 66*    Chemistries  Recent Labs  Lab 02/11/18 0646  NA 127*  K 3.4*  CL 97*  CO2 22  GLUCOSE 152*  BUN 18  CREATININE 0.60  CALCIUM 8.0*  AST 68*  ALT 90*  ALKPHOS 142*  BILITOT 0.8   Cardiac Enzymes Recent Labs  Lab 02/10/18 1033  TROPONINI <0.03   RADIOLOGY:  Dg Wrist Complete Right  Result Date: 02/10/2018 CLINICAL DATA:  Acute RIGHT wrist pain following fall 1 week ago. Initial encounter. EXAM: RIGHT WRIST - COMPLETE 3+ VIEW COMPARISON:  02/07/2018 FINDINGS: No acute fracture, subluxation or dislocation identified. Dorsal soft tissue swelling noted. Chondrocalcinosis again identified. Degenerative changes at the first carpometacarpal joint again noted. IMPRESSION: Soft tissue swelling without acute bony abnormality. Degenerative changes and chondrocalcinosis. Electronically Signed   By: Margarette Canada M.D.   On: 02/10/2018 12:14   Mr Wrist Right W Wo Contrast  Result Date: 02/10/2018 CLINICAL DATA:  Diffuse right wrist and hand swelling since fall a week ago. Concern for cellulitis with possible abscess. EXAM: MR OF THE RIGHT WRIST WITHOUT AND WITH CONTRAST TECHNIQUE: Multiplanar multisequence MR imaging of the right wrist was performed both before and after the administration of intravenous contrast.  CONTRAST:  71mL MULTIHANCE GADOBENATE DIMEGLUMINE 529 MG/ML IV SOLN COMPARISON:  Right hand x-rays from same day. FINDINGS: Despite efforts by the technologist and patient, motion  artifact is present on today's exam and could not be eliminated. This reduces exam sensitivity and specificity. Ligaments: Intact scapholunate and lunotriquetral ligaments. Triangular fibrocartilage: Possible small central perforation of the TFCC articular disc. Tendons: Intact flexor and extensor compartment tendons. Mild enhancing tenosynovitis involving the extensor carpi radialis longus and brevis tendons. Carpal tunnel/median nerve: Normal carpal tunnel. Normal median nerve. Guyon's canal: Normal. Joint/cartilage: Small radiocarpal, midcarpal, and distal radioulnar joint effusions with enhancing synovitis. Large first [place] joint effusion. Bones/carpal alignment: Small scattered erosions involving the carpal bones and at the base of the first metacarpal. Subluxation and widening of the first [facility] joint. Second and third MCP joint osteoarthritis. Normal carpal alignment. No fracture. Other: There is a large rim enhancing fluid collection centered around the first [facility] joint this is difficult to measure, but measures at least 3.3 x 3.2 x 4.1 cm (AP by transverse by CC). The collection extends primarily into the volar soft tissues adjacent to the first metacarpal. There is diffuse soft tissue swelling about the dorsal and radial wrist. IMPRESSION: 1. Large rim enhancing fluid collection centered around the first [facility] joint, measuring up to 4.1 cm, consistent with abscess. Large first [place] joint effusion, suspicious for septic arthritis. 2. Small radiocarpal, midcarpal, and distal radioulnar joint effusions with enhancing synovitis. Small scattered erosions involving the carpal bones. While inflammatory arthropathy could have this appearance, given the findings at the first [facility] joint, septic arthritis cannot be excluded. 3. Mild enhancing tenosynovitis involving the second extensor compartment, likely infectious in etiology. 4. Possible small central perforation of the TFCC articular disc. Electronically Signed   By:  Titus Dubin M.D.   On: 02/10/2018 23:24   Dg Hand Complete Right  Result Date: 02/10/2018 CLINICAL DATA:  Acute RIGHT hand pain following fall 1 week ago. Initial encounter. EXAM: RIGHT HAND - COMPLETE 3+ VIEW COMPARISON:  02/07/2018 and 10/08/2015 FINDINGS: There is no evidence of acute fracture, subluxation or dislocation. Dorsal soft tissue swelling identified. Wrist chondrocalcinosis noted. Degenerative changes at the index and middle finger PIP joints and first carpometacarpal joint noted. IMPRESSION: Soft tissue swelling without acute bony abnormality. Electronically Signed   By: Margarette Canada M.D.   On: 02/10/2018 12:16   ASSESSMENT AND PLAN:   Julie Holland  is a 77 y.o. female with a known history of arthritis, perianal carcinoma in situ, chronic kidney disease stage I, diabetes mellitus type 2, GERD, glaucoma, hemorrhoids, hyperlipidemia, hypertension presented to the emergency room with pain and swelling in the right wrist.  Patient had a fall at home secondary to loss of balance 1 week ago last Thursday.  She fell on her outstretched right hand imaging studies of the right upper extremity no fractures.  Patient has redness and swelling around the right wrist and is tender to touch  -Right wrist cellulitis with BC positive for MSSA  -change to IV cefazolin -ID input appreciated -will need PICC line for 4 weeks IV abxs -TTE tomorrow MR I of the right wrist positive for abscess Orthopedic surgery consultation with Dr Rudene Christians for evaluation of the right wrist for  incision and drainage  -Thrombocytopenia Significant drop in platelet count Hematology evaluation Could be reactive due to sepsis  - mild elevation in liver function tests F/U liver function tests  -Acute hypokalemia Replace potassium orally  -Acute hyponatremia  IV fluid  hydration and monitor electrolytes  -Sepsis secondary to cellulitis from the wrist IV antibiotics and IV fluids Follow-up lactic acid  level   d/w dr Rudene Christians, pt and husband Case discussed with Care Management/Social Worker. Management plans discussed with the patient, family and they are in agreement.  CODE STATUS: full  DVT Prophylaxis: scd  TOTAL TIME TAKING CARE OF THIS PATIENT: *30* minutes.  >50% time spent on counselling and coordination of care  POSSIBLE D/C IN 2-3 DAYS, DEPENDING ON CLINICAL CONDITION.  Note: This dictation was prepared with Dragon dictation along with smaller phrase technology. Any transcriptional errors that result from this process are unintentional.  Fritzi Mandes M.D on 02/11/2018 at 8:29 PM  Between 7am to 6pm - Pager - [phone]  After 6pm go to [email or web address] - Proofreader  [facility]  Office  [phone]  CC: Primary care physician; [name], MDPatient ID: Julie Holland, female   DOB: April 24, 1941, 76 y.o.   MRN: [id number]

## 2018-02-11 NOTE — Anesthesia Post-op Follow-up Note (Signed)
Anesthesia QCDR form completed.        

## 2018-02-11 NOTE — Progress Notes (Signed)
PHARMACY - PHYSICIAN COMMUNICATION CRITICAL VALUE ALERT - BLOOD CULTURE IDENTIFICATION (BCID)  Julie Holland is an 77 y.o. female who presented to [facility] on 02/10/2018 with a chief complaint of wrist pain s/t fall  Assessment: Tmax 101F, tachycardic, tachypneic, DG Hand: soft tissue swelling, sepsis s/t septic arthritis, 2/4 GPC BCID MSSA  Name of physician (or Provider) Contacted: Arta Silence  Current antibiotics: Vancomycin  Changes to prescribed antibiotics recommended:  Recommendations accepted by provider -- will switch to cefazolin 2g IV q8h  Results for orders placed or performed during the hospital encounter of 02/10/18  Blood Culture ID Panel (Reflexed) (Collected: 02/10/2018 11:22 AM)  Result Value Ref Range   Enterococcus species NOT DETECTED NOT DETECTED   Listeria monocytogenes NOT DETECTED NOT DETECTED   Staphylococcus species DETECTED (A) NOT DETECTED   Staphylococcus aureus DETECTED (A) NOT DETECTED   Methicillin resistance NOT DETECTED NOT DETECTED   Streptococcus species NOT DETECTED NOT DETECTED   Streptococcus agalactiae NOT DETECTED NOT DETECTED   Streptococcus pneumoniae NOT DETECTED NOT DETECTED   Streptococcus pyogenes NOT DETECTED NOT DETECTED   Acinetobacter baumannii NOT DETECTED NOT DETECTED   Enterobacteriaceae species NOT DETECTED NOT DETECTED   Enterobacter cloacae complex NOT DETECTED NOT DETECTED   Escherichia coli NOT DETECTED NOT DETECTED   Klebsiella oxytoca NOT DETECTED NOT DETECTED   Klebsiella pneumoniae NOT DETECTED NOT DETECTED   Proteus species NOT DETECTED NOT DETECTED   Serratia marcescens NOT DETECTED NOT DETECTED   Haemophilus influenzae NOT DETECTED NOT DETECTED   Neisseria meningitidis NOT DETECTED NOT DETECTED   Pseudomonas aeruginosa NOT DETECTED NOT DETECTED   Candida albicans NOT DETECTED NOT DETECTED   Candida glabrata NOT DETECTED NOT DETECTED   Candida krusei NOT DETECTED NOT DETECTED   Candida parapsilosis NOT  DETECTED NOT DETECTED   Candida tropicalis NOT DETECTED NOT DETECTED   Tobie Lords, PharmD, BCPS Clinical Pharmacist 02/11/2018

## 2018-02-11 NOTE — Care Management Note (Signed)
Case Management Note  Patient Details  Name: Julie Holland MRN: [id number] Date of Birth: 11-Mar-1941  Subjective/Objective:   Admitted to [facility] with the diagnosis of cellulitis. Lives with husband, [name], [phone]). Last seen Dr. Sanda Klein 12/10/17. Prescriptions are filled at [facility]. No home Health. No skilled facility. No home oxygen. Cane in the home, if needed. Takes care of all basic and instrumental activities of daily living herself, drives. Fell last Thursday. Last 10 pounds in the last 6-8 months. Husband will transport.                 Action/Plan: Scheduled for Incision & Drainage of right hand today. Husband states he doesn't want any home health if recommended.    Expected Discharge Date:                  Expected Discharge Plan:     In-House Referral:   yes  Discharge planning Services     Post Acute Care Choice:    Choice offered to:     DME Arranged:    DME Agency:     HH Arranged:    HH Agency:     Status of Service:     If discussed at [employer], dates discussed:    Additional Comments:  Shelbie Ammons, RN MSN CCM Care Management [phone] 02/11/2018, 2:00 PM

## 2018-02-11 NOTE — Anesthesia Preprocedure Evaluation (Signed)
Anesthesia Evaluation  Patient identified by MRN, date of birth, ID band Patient awake    Reviewed: Allergy & Precautions, H&P , NPO status , Patient's Chart, lab work & pertinent test results  History of Anesthesia Complications Negative for: history of anesthetic complications  Airway Mallampati: III  TM Distance: <3 FB Neck ROM: limited    Dental  (+) Chipped, Poor Dentition, Missing   Pulmonary neg shortness of breath, asthma ,           Cardiovascular Exercise Tolerance: Good hypertension, (-) angina(-) Past MI and (-) DOE + dysrhythmias      Neuro/Psych negative neurological ROS  negative psych ROS   GI/Hepatic Neg liver ROS, GERD  Medicated and Controlled,  Endo/Other  diabetes, Type 2  Renal/GU Renal disease     Musculoskeletal  (+) Arthritis ,   Abdominal   Peds  Hematology  (+) Blood dyscrasia, ,   Anesthesia Other Findings Past Medical History: No date: Arthritis No date: Asthma 03/2015: Cancer ([place])     Comment:  In situ carcinoma of the perianal skin, incidental               finding at hemorrhoidectomy. No date: Cataract No date: Chronic kidney disease     Comment:  stage 1 No date: Diabetes mellitus     Comment:  type II No date: Dry eye of right side No date: GERD (gastroesophageal reflux disease)     Comment:  OCC 2018: Glaucoma     Comment:  RIGHT EYE  No date: Hemorrhoids No date: Hyperlipidemia No date: Hypertension  Past Surgical History: No date: CATARACT EXTRACTION, BILATERAL No date: CHOLECYSTECTOMY 02/13/13: COLONOSCOPY     Comment:  Dr Bary Castilla 03/20/2015: EPIGASTRIC HERNIA REPAIR; N/A     Comment:  Procedure: HERNIA REPAIR EPIGASTRIC ADULT;  Surgeon:               Robert Bellow, MD;  Location: [facility] ORS;  Service:               General;  Laterality: N/A; 03/20/2015: HEMORRHOID SURGERY; N/A     Comment:   FOCAL HIGH-GRADE SQUAMOUS INTRAEPITHELIAL LESION (HSIL,               ANAL /HEMORRHOIDECTOMY;   Robert Bellow, MD [facility] ORS;              : General;  Laterality: N/A; July 2016: HERNIA REPAIR     Comment:  Epigastric hernia, primary repair age 11: TONSILLECTOMY 01/1989: TOTAL ABDOMINAL HYSTERECTOMY No date: TUBAL LIGATION 07/18/2016: TUMOR EXCISION; N/A     Comment:  EXCISION RECTAL MASS; 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  Case has been               presented at the [facility] tumor board. No indication for               additional treatment outside of serial exams  Surgeon:               Robert Bellow, MD;  Location: [facility] ORS;  Service:               General;  Laterality: N/A;  BMI    Body Mass Index:  19.64 kg/m      Reproductive/Obstetrics negative OB ROS  Anesthesia Physical Anesthesia Plan  ASA: III  Anesthesia Plan: General LMA   Post-op Pain Management:    Induction: Intravenous  PONV Risk Score and Plan: Ondansetron, Dexamethasone, Midazolam and Treatment may vary due to age or medical condition  Airway Management Planned: Natural Airway and Nasal Cannula  Additional Equipment:   Intra-op Plan:   Post-operative Plan:   Informed Consent: I have reviewed the patients History and Physical, chart, labs and discussed the procedure including the risks, benefits and alternatives for the proposed anesthesia with the patient or authorized representative who has indicated his/her understanding and acceptance.   Dental Advisory Given  Plan Discussed with: Anesthesiologist, CRNA and Surgeon  Anesthesia Plan Comments: (Patient consented for risks of anesthesia including but not limited to:  - adverse reactions to medications - risk of intubation if required - damage to teeth, lips or other oral mucosa - sore throat or hoarseness - Damage to heart, brain, lungs or loss of life  Patient voiced understanding.)         Anesthesia Quick Evaluation

## 2018-02-11 NOTE — Consult Note (Signed)
[facility]  Date of admission:  02/10/2018  Inpatient day:  02/11/2018  Consulting physician:  Dr. Saundra Shelling  Reason for Consultation:  Thrombocytopenia.  Chief Complaint: Julie Holland is a 77 y.o. 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  HPI:  The patient has been followed in the hematology clinic.  She has had intermittent thrombocytopenia dating back to 03/2015.  Platelet count has fluctuated between 60,000 - 128,000 without trend.  Work-up was negative.  She has no splenomegaly.  She has never required treatment as her platelet count has been > 50,000.    She was last seen in the hematology clinic on 01/26/2018.  At that time, platelet count was 128,000.  Hematocrit was 35.2 and hemoglobin 11.8.  She states that she was in her usual state of good health until 02/04/2018 when she fell outside in the dirt on her outstretched right hand.  She denied any open cuts or bleeding.  She also bruised her right leg.  She describes a some diarrhea on 02/07/2018 after dietary indiscretion (fatty food)..    She was seen in the [facility] ER on 02/07/2018 secondary to right wrist pain and swelling.Plan films revealed no fracture.  Her hand was wrapped and she was prescribed naprosyn.  She represented to the [facility] ER on 02/10/2018 with increased pain and swelling of her right wrist and hand.  She also noted a fever.  She denied any diarrhea.  Initial CBC revealed a hematocrit of 28.8, hemoglobin 9.4, platelets 67,000, WBC 29,100.  LFTs revealed an albumen 2.8, AST 109, ALT 112, and alkaline phosphatase 144.  Blood cultures + MSSA.  She was placed on vancomycin.  Right wrist MRI revealed a 4.1 cm rim enhancing fluid collection around the 1st [facility] joint c/w abscess.  There was small radiocarpal, midcarpal, and distal radioulnar joint effusions with enhancing synovitis.  There were small scattered erosions involving the carpal bones (septic arthritis cannot be excluded).  There was mild enhancing tenosynovitis involving the second extensor compartment, likely infectious in etiology  Follow-up CBC today revealed a hematocrit of 27.1, hemoglobin 9.0, platelets 66,000, WBC 25,100.  LFT included a AST 68, ALT 90, and alkaline phosphatase 142 (improved).  Creatinine is stable 0.60.  She denies any new medications or herbal products.   Past Medical History:  Diagnosis Date  . Arthritis   . Asthma   . 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  . Cataract   . Chronic kidney disease    stage 1  . Diabetes mellitus    type II  . Dry eye of right side   . GERD (gastroesophageal reflux disease)    OCC  . Glaucoma 2018   RIGHT EYE   . Hemorrhoids   . Hyperlipidemia   . Hypertension     Past Surgical History:  Procedure Laterality Date  . CATARACT EXTRACTION, BILATERAL    . CHOLECYSTECTOMY    . COLONOSCOPY  02/13/13   Dr Bary Castilla  . EPIGASTRIC HERNIA REPAIR N/A 03/20/2015   Procedure: HERNIA REPAIR EPIGASTRIC ADULT;  Surgeon: Robert Bellow, MD;  Location: [facility] ORS;  Service: General;  Laterality: N/A;  . HEMORRHOID SURGERY N/A 03/20/2015    FOCAL HIGH-GRADE SQUAMOUS INTRAEPITHELIAL LESION (HSIL, ANAL /HEMORRHOIDECTOMY;   Robert Bellow, MD [facility] ORS;  : General;  Laterality: N/A;  . HERNIA REPAIR  July 2016   Epigastric hernia, primary repair  . TONSILLECTOMY  age 48  . TOTAL ABDOMINAL HYSTERECTOMY  01/1989  . TUBAL LIGATION    . 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  Case has been presented at the [facility] tumor board. No indication for additional treatment outside of serial exams  Surgeon: Robert Bellow, MD;  Location: [facility] ORS;  Service: General;  Laterality: N/A;    Family History  Problem  Relation Age of Onset  . Bladder Cancer Mother   . Colon cancer Father   . Lung cancer Brother   . Lymphoma Sister   . Breast cancer Neg Hx     Social History:  reports that she has never smoked. She has never used smokeless tobacco. She reports that she does not drink alcohol or use drugs.  She lives in [place] with her husband, [name].  She denies any exposure to radiations or toxins.  She is accompanied by her husband today.  Allergies:  Allergies  Allergen Reactions  . Hydrocodone Nausea And Vomiting and Other (See Comments)    Vertigo but patient takes hydrocodone/acetaminophen outpatient    Medications Prior to Admission  Medication Sig Dispense Refill  . ACCU-CHEK SMARTVIEW test strip USE AS INSTRUCTED TO CHECK  BLOOD GLUCOSE ONCE DAILY if desired; LON 99 months; Dx E11.9 100 each 1  . acetaminophen (TYLENOL) 500 MG tablet Take 500-1,000 mg by mouth every 6 (six) hours as needed (for pain).    . ADVAIR DISKUS 100-50 MCG/DOSE AEPB USE 1 PUFF TWO TIMES DAILY 3 each 2  . albuterol (PROVENTIL HFA;VENTOLIN HFA) 108 (90 Base) MCG/ACT inhaler Inhale 2 puffs into the lungs every 6 (six) hours as needed for wheezing or shortness of breath. 1 Inhaler 2  . aspirin 81 MG chewable tablet Chew 81 mg by mouth daily.    . butalbital-acetaminophen-caffeine (FIORICET, ESGIC) 50-325-40 MG tablet Take 1-2 tablets by mouth every 6 (six) hours as needed for headache. 10 tablet 0  . Carboxymeth-Glycerin-Polysorb (REFRESH OPTIVE ADVANCED OP) Place 1 drop into both eyes 3 (three) times daily as needed (for dry/irritated eyes).    . carvedilol (COREG) 6.25 MG tablet Take 1 tablet (6.25 mg total) by mouth 2 (two) times daily with a meal. 180 tablet 1  . latanoprost (XALATAN) 0.005 % ophthalmic solution Place 1 drop at bedtime into the right eye.    Marland Kitchen lisinopril (PRINIVIL,ZESTRIL) 10 MG tablet Take 1 tablet (10 mg total) by mouth daily. 90 tablet 0  . metFORMIN (GLUCOPHAGE) 850 MG tablet Take 1 tablet (850  mg total) by mouth 2 (two) times daily with a meal. 180 tablet 0  . Multiple Vitamin (MULTIVITAMIN) tablet Take 2 tablets by mouth daily.     . Naproxen 375 MG TBEC Take 1 tablet (375 mg total) by mouth 2 (two) times daily as needed. 30 each 0  . Nutritional Supplements (ESTROVEN PO) Take 1 tablet by mouth daily.    . simvastatin (ZOCOR) 20 MG tablet TAKE 1 TABLET BY MOUTH AT  BEDTIME 90 tablet 0    Review of Systems: GENERAL:  Fatigue.  Two day h/o fever.  No sweats or weight loss. PERFORMANCE STATUS (ECOG):  2 HEENT:  No visual changes, runny nose, sore throat, mouth sores or tenderness. Lungs: No shortness of breath or cough.  No hemoptysis. Cardiac:  No chest pain, palpitations, orthopnea, or PND. GI:  Transient diarrhea on  02/07/2018.  No nausea, vomiting, constipation, melena or hematochezia. GU:  No urgency, frequency, dysuria, or hematuria. Musculoskeletal:  Trauma to right hand s/p fall.  Hand tender and edematous.  No back pain.  Extremities:  No pain or swelling. Skin:  Bruising left leg s/p fall.  No rashes or skin changes. Neuro:  No headache, numbness or weakness, balance or coordination issues. Endocrine:  Diabetes.  No thyroid issues, hot flashes or night sweats. Psych:  No mood changes, depression or anxiety. Pain:  Right hand pain. Review of systems:  All other systems reviewed and found to be negative.  Physical Exam:  Blood pressure 136/71, pulse 92, temperature 99.1 F (37.3 C), temperature source Oral, resp. rate 17, height _0  (1.651 m), weight 118 lb (53.5 kg), SpO2 95 %.  GENERAL:  Thin woman lying comfortably on the medical unit in no acute distress. MENTAL STATUS:  Alert and oriented to person, place and time. HEAD:  Short gray hair.  Normocephalic, atraumatic, face symmetric, no Cushingoid features. EYES:  Brown eyes.  Pupils equal round and reactive to light and accomodation.  No conjunctivitis or scleral icterus. ENT:  Oropharynx clear without lesion.   Tongue normal. Mucous membranes moist.  RESPIRATORY:  Clear to auscultation without rales, wheezes or rhonchi. CARDIOVASCULAR:  Regular rate and rhythm without murmur, rub or gallop. ABDOMEN:  Soft, non-tender, with active bowel sounds, and no hepatosplenomegaly.  No masses. SKIN:  No rashes, ulcers or lesions.  Left leg with upper medial ecchymosis.  No increased warmth or tenderness. EXTREMITIES: Right hand markedly edematous with moderate thenar eminence ecchymosis.  Skin taught, pink, and warm.    Fingers edematous.  No lower extremity edema.  No palpable cords. LYMPH NODES: No palpable cervical, supraclavicular, axillary or inguinal adenopathy  NEUROLOGICAL: Unremarkable. PSYCH:  Appropriate.   Results for orders placed or performed during the hospital encounter of 02/10/18 (from the past 48 hour(s))  CBC with Differential     Status: Abnormal   Collection Time: 02/10/18 10:33 AM  Result Value Ref Range   WBC 29.1 (H) 3.6 - 11.0 K/uL   RBC 3.23 (L) 3.80 - 5.20 MIL/uL   Hemoglobin 9.4 (L) 12.0 - 16.0 g/dL   HCT 28.8 (L) 35.0 - 47.0 %   MCV 89.2 80.0 - 100.0 fL   MCH 29.0 26.0 - 34.0 pg   MCHC 32.5 32.0 - 36.0 g/dL   RDW 16.3 (H) 11.5 - 14.5 %   Platelets 67 (L) 150 - 440 K/uL    Comment: PLATELET CLUMPS NOTED ON SMEAR, COUNT APPEARS ADEQUATE   Neutrophils Relative % 70 %   Lymphocytes Relative 1 %   Monocytes Relative 23 %   Eosinophils Relative 3 %   Basophils Relative 0 %   Band Neutrophils 3 %   Metamyelocytes Relative 0 %   Myelocytes 0 %   Promyelocytes Relative 0 %   Blasts 0 %   nRBC 0 0 /100 WBC   Other 0 %   Neutro Abs 21.2 (H) 1.4 - 6.5 K/uL   Lymphs Abs 0.3 (L) 1.0 - 3.6 K/uL   Monocytes Absolute 6.7 (H) 0.2 - 0.9 K/uL   Eosinophils Absolute 0.9 (H) 0 - 0.7 K/uL   Basophils Absolute 0.0 0 - 0.1 K/uL   Smear Review MORPHOLOGY UNREMARKABLE     Comment: Performed at [facility], [address], [place], [place] [address]  Comprehensive metabolic  panel     Status: Abnormal   Collection Time: 02/10/18  10:33 AM  Result Value Ref Range   Sodium 126 (L) 135 - 145 mmol/L   Potassium 3.2 (L) 3.5 - 5.1 mmol/L   Chloride 92 (L) 101 - 111 mmol/L   CO2 24 22 - 32 mmol/L   Glucose, Bld 276 (H) 65 - 99 mg/dL   BUN 25 (H) 6 - 20 mg/dL   Creatinine, Ser 0.83 0.44 - 1.00 mg/dL   Calcium 8.2 (L) 8.9 - 10.3 mg/dL   Total Protein 6.8 6.5 - 8.1 g/dL   Albumin 2.8 (L) 3.5 - 5.0 g/dL   AST 109 (H) 15 - 41 U/L   ALT 112 (H) 14 - 54 U/L   Alkaline Phosphatase 144 (H) 38 - 126 U/L   Total Bilirubin 0.7 0.3 - 1.2 mg/dL   GFR calc non Af Amer >60 >60 mL/min   GFR calc Af Amer >60 >60 mL/min    Comment: (NOTE) The eGFR has been calculated using the CKD EPI equation. This calculation has not been validated in all clinical situations. eGFR's persistently <60 mL/min signify possible Chronic Kidney Disease.    Anion gap 10 5 - 15    Comment: Performed at [facility], [address] [place], [place] [address]  Troponin I     Status: None   Collection Time: 02/10/18 10:33 AM  Result Value Ref Range   Troponin I <0.03 <0.03 ng/mL    Comment: Performed at [facility], [address] [place], [place] [address]  Glucose, capillary     Status: Abnormal   Collection Time: 02/10/18 10:57 AM  Result Value Ref Range   Glucose-Capillary 268 (H) 65 - 99 mg/dL  Lactic acid, plasma     Status: Abnormal   Collection Time: 02/10/18 11:22 AM  Result Value Ref Range   Lactic Acid, Venous 2.4 (HH) 0.5 - 1.9 mmol/L    Comment: CRITICAL RESULT CALLED TO, READ BACK BY AND VERIFIED WITH ALISHA GRANGER AT 1204 ON  02/10/2018 JJB Performed at [facility], [address] [place], [place] [address]   Blood culture (routine x 2)     Status: None (Preliminary result)   Collection Time: 02/10/18 11:22 AM  Result Value Ref Range   Specimen Description BLOOD BLOOD LEFT FOREARM    Special Requests      BOTTLES DRAWN AEROBIC AND ANAEROBIC  Blood Culture results may not be optimal due to an excessive volume of blood received in culture bottles   Culture  Setup Time      GRAM POSITIVE COCCI IN BOTH AEROBIC AND ANAEROBIC BOTTLES CRITICAL VALUE NOTED.  VALUE IS CONSISTENT WITH PREVIOUSLY REPORTED AND CALLED VALUE. Performed at [facility], [address] [place], [place] [address]    Culture GRAM POSITIVE COCCI    Report Status PENDING   Blood culture (routine x 2)     Status: None (Preliminary result)   Collection Time: 02/10/18 11:22 AM  Result Value Ref Range   Specimen Description BLOOD RIGHT ANTECUBITAL    Special Requests      BOTTLES DRAWN AEROBIC AND ANAEROBIC Blood Culture results may not be optimal due to an inadequate volume of blood received in culture bottles   Culture  Setup Time      Organism ID to follow GRAM POSITIVE COCCI IN BOTH AEROBIC AND ANAEROBIC BOTTLES CRITICAL RESULT CALLED TO, READ BACK BY AND VERIFIED WITH: DAVID BESANTI ON 02/11/18 AT 0117 JAG Performed at [facility], [address] [place], [place] [address]  Culture GRAM POSITIVE COCCI    Report Status PENDING   Blood Culture ID Panel (Reflexed)     Status: Abnormal   Collection Time: 02/10/18 11:22 AM  Result Value Ref Range   Enterococcus species NOT DETECTED NOT DETECTED   Listeria monocytogenes NOT DETECTED NOT DETECTED   Staphylococcus species DETECTED (A) NOT DETECTED    Comment: CRITICAL RESULT CALLED TO, READ BACK BY AND VERIFIED WITH: DAVID BESANTI ON 02/11/18 AT 0117 JAG    Staphylococcus aureus DETECTED (A) NOT DETECTED    Comment: Methicillin (oxacillin) susceptible Staphylococcus aureus (MSSA). Preferred therapy is anti staphylococcal beta lactam antibiotic (Cefazolin or Nafcillin), unless clinically contraindicated. CRITICAL RESULT CALLED TO, READ BACK BY AND VERIFIED WITH: DAVID BESANTI ON 02/11/18 AT 0117  JAG    Methicillin resistance NOT DETECTED NOT DETECTED   Streptococcus species NOT  DETECTED NOT DETECTED   Streptococcus agalactiae NOT DETECTED NOT DETECTED   Streptococcus pneumoniae NOT DETECTED NOT DETECTED   Streptococcus pyogenes NOT DETECTED NOT DETECTED   Acinetobacter baumannii NOT DETECTED NOT DETECTED   Enterobacteriaceae species NOT DETECTED NOT DETECTED   Enterobacter cloacae complex NOT DETECTED NOT DETECTED   Escherichia coli NOT DETECTED NOT DETECTED   Klebsiella oxytoca NOT DETECTED NOT DETECTED   Klebsiella pneumoniae NOT DETECTED NOT DETECTED   Proteus species NOT DETECTED NOT DETECTED   Serratia marcescens NOT DETECTED NOT DETECTED   Haemophilus influenzae NOT DETECTED NOT DETECTED   Neisseria meningitidis NOT DETECTED NOT DETECTED   Pseudomonas aeruginosa NOT DETECTED NOT DETECTED   Candida albicans NOT DETECTED NOT DETECTED   Candida glabrata NOT DETECTED NOT DETECTED   Candida krusei NOT DETECTED NOT DETECTED   Candida parapsilosis NOT DETECTED NOT DETECTED   Candida tropicalis NOT DETECTED NOT DETECTED    Comment: Performed at [facility], [address] [place], [place] [address]  Glucose, capillary     Status: Abnormal   Collection Time: 02/10/18  4:45 PM  Result Value Ref Range   Glucose-Capillary 204 (H) 65 - 99 mg/dL  Lactic acid, plasma     Status: None   Collection Time: 02/10/18  5:00 PM  Result Value Ref Range   Lactic Acid, Venous 1.5 0.5 - 1.9 mmol/L    Comment: Performed at [facility], [address] [place], [place] [address]  Glucose, capillary     Status: Abnormal   Collection Time: 02/10/18  8:42 PM  Result Value Ref Range   Glucose-Capillary 197 (H) 65 - 99 mg/dL  Urinalysis, Complete w Microscopic     Status: Abnormal   Collection Time: 02/10/18  8:55 PM  Result Value Ref Range   Color, Urine AMBER (A) YELLOW    Comment: BIOCHEMICALS MAY BE AFFECTED BY COLOR   APPearance CLOUDY (A) CLEAR   Specific Gravity, Urine 1.022 1.005 - 1.030   pH 5.0 5.0 - 8.0   Glucose, UA >=500 (A) NEGATIVE mg/dL    Hgb urine dipstick NEGATIVE NEGATIVE   Bilirubin Urine NEGATIVE NEGATIVE   Ketones, ur NEGATIVE NEGATIVE mg/dL   Protein, ur 100 (A) NEGATIVE mg/dL   Nitrite NEGATIVE NEGATIVE   Leukocytes, UA NEGATIVE NEGATIVE   RBC / HPF 11-20 0 - 5 RBC/hpf   WBC, UA 6-10 0 - 5 WBC/hpf   Bacteria, UA FEW (A) NONE SEEN   Squamous Epithelial / LPF 0-5 0 - 5   Mucus PRESENT    Hyaline Casts, UA PRESENT     Comment: Performed at [facility], [address] [place], [place] [address]  CBC     Status: Abnormal   Collection Time: 02/11/18  6:46 AM  Result Value Ref Range   WBC 25.1 (H) 3.6 - 11.0 K/uL   RBC 3.07 (L) 3.80 - 5.20 MIL/uL   Hemoglobin 9.0 (L) 12.0 - 16.0 g/dL   HCT 27.1 (L) 35.0 - 47.0 %   MCV 88.3 80.0 - 100.0 fL   MCH 29.2 26.0 - 34.0 pg   MCHC 33.1 32.0 - 36.0 g/dL   RDW 16.4 (H) 11.5 - 14.5 %   Platelets 66 (L) 150 - 440 K/uL    Comment: Performed at [facility], [address] [place], [place] [address]  Basic metabolic panel     Status: Abnormal   Collection Time: 02/11/18  6:46 AM  Result Value Ref Range   Sodium 127 (L) 135 - 145 mmol/L   Potassium 3.4 (L) 3.5 - 5.1 mmol/L   Chloride 97 (L) 101 - 111 mmol/L   CO2 22 22 - 32 mmol/L   Glucose, Bld 152 (H) 65 - 99 mg/dL   BUN 18 6 - 20 mg/dL   Creatinine, Ser 0.60 0.44 - 1.00 mg/dL   Calcium 8.0 (L) 8.9 - 10.3 mg/dL   GFR calc non Af Amer >60 >60 mL/min   GFR calc Af Amer >60 >60 mL/min    Comment: (NOTE) The eGFR has been calculated using the CKD EPI equation. This calculation has not been validated in all clinical situations. eGFR's persistently <60 mL/min signify possible Chronic Kidney Disease.    Anion gap 8 5 - 15    Comment: Performed at [facility], [address] [place], [place] [address]  Hepatic function panel     Status: Abnormal   Collection Time: 02/11/18  6:46 AM  Result Value Ref Range   Total Protein 6.7 6.5 - 8.1 g/dL   Albumin 2.7 (L) 3.5 - 5.0 g/dL   AST 68  (H) 15 - 41 U/L   ALT 90 (H) 14 - 54 U/L   Alkaline Phosphatase 142 (H) 38 - 126 U/L   Total Bilirubin 0.8 0.3 - 1.2 mg/dL   Bilirubin, Direct 0.2 0.1 - 0.5 mg/dL   Indirect Bilirubin 0.6 0.3 - 0.9 mg/dL    Comment: Performed at [facility], [address] [place], [place] [address]  Glucose, capillary     Status: Abnormal   Collection Time: 02/11/18  7:42 AM  Result Value Ref Range   Glucose-Capillary 150 (H) 65 - 99 mg/dL   Dg Wrist Complete Right  Result Date: 02/10/2018 CLINICAL DATA:  Acute RIGHT wrist pain following fall 1 week ago. Initial encounter. EXAM: RIGHT WRIST - COMPLETE 3+ VIEW COMPARISON:  02/07/2018 FINDINGS: No acute fracture, subluxation or dislocation identified. Dorsal soft tissue swelling noted. Chondrocalcinosis again identified. Degenerative changes at the first carpometacarpal joint again noted. IMPRESSION: Soft tissue swelling without acute bony abnormality. Degenerative changes and chondrocalcinosis. Electronically Signed   By: Margarette Canada M.D.   On: 02/10/2018 12:14   Mr Wrist Right W Wo Contrast  Result Date: 02/10/2018 CLINICAL DATA:  Diffuse right wrist and hand swelling since fall a week ago. Concern for cellulitis with possible abscess. EXAM: MR OF THE RIGHT WRIST WITHOUT AND WITH CONTRAST TECHNIQUE: Multiplanar multisequence MR imaging of the right wrist was performed both before and after the administration of intravenous contrast. CONTRAST:  41m MULTIHANCE GADOBENATE DIMEGLUMINE 529 MG/ML IV SOLN COMPARISON:  Right hand x-rays from same  day. FINDINGS: Despite efforts by the technologist and patient, motion artifact is present on today's exam and could not be eliminated. This reduces exam sensitivity and specificity. Ligaments: Intact scapholunate and lunotriquetral ligaments. Triangular fibrocartilage: Possible small central perforation of the TFCC articular disc. Tendons: Intact flexor and extensor compartment tendons. Mild enhancing tenosynovitis  involving the extensor carpi radialis longus and brevis tendons. Carpal tunnel/median nerve: Normal carpal tunnel. Normal median nerve. Guyon's canal: Normal. Joint/cartilage: Small radiocarpal, midcarpal, and distal radioulnar joint effusions with enhancing synovitis. Large first [place] joint effusion. Bones/carpal alignment: Small scattered erosions involving the carpal bones and at the base of the first metacarpal. Subluxation and widening of the first [facility] joint. Second and third MCP joint osteoarthritis. Normal carpal alignment. No fracture. Other: There is a large rim enhancing fluid collection centered around the first [facility] joint this is difficult to measure, but measures at least 3.3 x 3.2 x 4.1 cm (AP by transverse by CC). The collection extends primarily into the volar soft tissues adjacent to the first metacarpal. There is diffuse soft tissue swelling about the dorsal and radial wrist. IMPRESSION: 1. Large rim enhancing fluid collection centered around the first [facility] joint, measuring up to 4.1 cm, consistent with abscess. Large first [place] joint effusion, suspicious for septic arthritis. 2. Small radiocarpal, midcarpal, and distal radioulnar joint effusions with enhancing synovitis. Small scattered erosions involving the carpal bones. While inflammatory arthropathy could have this appearance, given the findings at the first [facility] joint, septic arthritis cannot be excluded. 3. Mild enhancing tenosynovitis involving the second extensor compartment, likely infectious in etiology. 4. Possible small central perforation of the TFCC articular disc. Electronically Signed   By: Titus Dubin M.D.   On: 02/10/2018 23:24   Dg Hand Complete Right  Result Date: 02/10/2018 CLINICAL DATA:  Acute RIGHT hand pain following fall 1 week ago. Initial encounter. EXAM: RIGHT HAND - COMPLETE 3+ VIEW COMPARISON:  02/07/2018 and 10/08/2015 FINDINGS: There is no evidence of acute fracture, subluxation or dislocation. Dorsal soft tissue  swelling identified. Wrist chondrocalcinosis noted. Degenerative changes at the index and middle finger PIP joints and first carpometacarpal joint noted. IMPRESSION: Soft tissue swelling without acute bony abnormality. Electronically Signed   By: Margarette Canada M.D.   On: 02/10/2018 12:16    Assessment:  The patient is a 77 y.o.  woman with presumed ITP dating back to 03/2015.  Platelet count has varied between 60,000 - 128,000.  She denies any new medications or herbal products.  She was admitted with right hand swelling and imaging studies c/w abscess following a fall.  Blood cultures + MSSA.   Platelet count on admission was 67,000 and 66,000 today.  Peripheral smear reveals normal RBC morphology and moderate thrombocytopenia.  No schistocytes.  No evidence of TTP or DIC.  No heparin exposure.  Plan:   1.  Hematology:  Patient with presumed ITP (diagnosis of exclusion).  She has never required treatment.  Suspect drop in platelet count from 128,000 to 67,000 secondary to consumption of platelets associated with left leg bruising and a hematoma in her right hand.  She likely has decreased marrow reserve.  She has increased destruction of platelets from  ITP with current fever/infection.  She denies any new medication or herbal product.  Anticipate platelet count will improve with treatment of infection.  If any excess bleeding with drop in platelet count, please contact me.  If she would require a platelet transfusion (doubt), please check 1 hour post platelet count.  Check CBC daily.   Thank you for allowing me to participate in GIANELLE MCCAUL 's care.  I will follow her closely with you while hospitalized and after discharge in the outpatient department.   Julie Asal, MD  02/11/2018, 7:59 AM

## 2018-02-11 NOTE — Op Note (Signed)
02/11/2018  8:10 PM  PATIENT:  Julie Holland  77 y.o. female  PRE-OPERATIVE DIAGNOSIS:  RIGHT HAND ABSCESS  POST-OPERATIVE DIAGNOSIS:  RIGHT HAND ABSCESS  PROCEDURE:  Procedure(s): INCISION AND DRAINAGE- RIGHT HAND (Right)  SURGEON: Laurene Footman, MD  ASSISTANTS: none  ANESTHESIA:   general  EBL:  Total I/O In: 200 [I.V.:200] Out: -   BLOOD ADMINISTERED:none  DRAINS: Penrose drain in the Dorsal and volar aspect of the hand and wrist   LOCAL MEDICATIONS USED:  NONE  SPECIMEN:  Source of Specimen:  Culture of abscess  DISPOSITION OF SPECIMEN:  Microbiology  COUNTS:  YES  TOURNIQUET:  * Missing tourniquet times found for documented tourniquets in log: [id number] *12 minutes at 250 mmHg  IMPLANTS: None  DICTATION: .Dragon Dictation patient was brought to the operating room and after adequate anesthesia was obtained the right hand was prepped and draped you sterile fashion with a tourniquet applied to the upper forearm.  After patient identification and timeout procedures were completed tourniquet was raised and incision was made on the volar and dorsal border of the [facility] joint approximately centimeter proximal and 2 cm distal subcutaneous tissue spread and there is purulent material present in the [facility] joint consistent with the MRI findings.  This was irrigated out after culture obtained and then with a hemostat the volar and dorsal aspects of the hand and wrist were explored and there were pockets of purulent material within this after thorough irrigation of the spaces volar and dorsal Penrose drains were placed in these areas to allow for drainage postop and prevent recurrence the wound was left open.  Xeroform fluffs and 4 x 4's were applied along with web roll and an Ace wrap and a bulky dressing to help control edema.  PLAN OF CARE: Continue as inpatient  PATIENT DISPOSITION:  PACU - hemodynamically stable.

## 2018-02-11 NOTE — Transfer of Care (Signed)
Immediate Anesthesia Transfer of Care Note  Patient: Julie Holland  Procedure(s) Performed: INCISION AND DRAINAGE- RIGHT HAND (Right )  Patient Location: PACU  Anesthesia Type:General  Level of Consciousness: sedated  Airway & Oxygen Therapy: Patient Spontanous Breathing and Patient connected to face mask oxygen  Post-op Assessment: Report given to RN and Post -op Vital signs reviewed and stable  Post vital signs: Reviewed  Last Vitals:  Vitals Value Taken Time  BP 126/62 02/11/2018  8:10 PM  Temp 37.8 C 02/11/2018  8:09 PM  Pulse 99 02/11/2018  8:10 PM  Resp 18 02/11/2018  8:10 PM  SpO2 98 % 02/11/2018  8:10 PM  Vitals shown include unvalidated device data.  Last Pain:  Vitals:   02/11/18 1227  TempSrc: Oral  PainSc:          Complications: No apparent anesthesia complications

## 2018-02-11 NOTE — Anesthesia Procedure Notes (Signed)
Procedure Name: LMA Insertion Performed by: Ercil Cassis, CRNA Pre-anesthesia Checklist: Patient identified, Patient being monitored, Timeout performed, Emergency Drugs available and Suction available Patient Re-evaluated:Patient Re-evaluated prior to induction Oxygen Delivery Method: Circle system utilized Preoxygenation: Pre-oxygenation with 100% oxygen Induction Type: IV induction Ventilation: Mask ventilation without difficulty LMA: LMA inserted LMA Size: 3.5 Tube type: Oral Number of attempts: 1 Placement Confirmation: positive ETCO2 and breath sounds checked- equal and bilateral Tube secured with: Tape Dental Injury: Teeth and Oropharynx as per pre-operative assessment        

## 2018-02-11 NOTE — Anesthesia Postprocedure Evaluation (Signed)
Anesthesia Post Note  Patient: Julie Holland  Procedure(s) Performed: INCISION AND DRAINAGE- RIGHT HAND (Right Hand)  Patient location during evaluation: PACU Anesthesia Type: General Level of consciousness: awake and alert Pain management: pain level controlled Vital Signs Assessment: post-procedure vital signs reviewed and stable Respiratory status: spontaneous breathing, nonlabored ventilation, respiratory function stable and patient connected to nasal cannula oxygen Cardiovascular status: blood pressure returned to baseline and stable Postop Assessment: no apparent nausea or vomiting Anesthetic complications: no     Last Vitals:  Vitals:   02/11/18 2045 02/11/18 2054  BP:  (!) 149/74  Pulse: 94 93  Resp: 19 17  Temp:  37.8 C  SpO2: 92% 95%    Last Pain:  Vitals:   02/11/18 2054  TempSrc:   PainSc: Asleep                 Precious Haws Piscitello

## 2018-02-12 ENCOUNTER — Inpatient Hospital Stay (HOSPITAL_COMMUNITY)
Admit: 2018-02-12 | Discharge: 2018-02-12 | Disposition: A | Discharge status: Home / Self Care | Payer: Medicare Other | Attending: Orthopedic Surgery | Admitting: Orthopedic Surgery

## 2018-02-12 ENCOUNTER — Encounter: Payer: Self-pay | Admitting: Orthopedic Surgery

## 2018-02-12 DIAGNOSIS — I129 Hypertensive chronic kidney disease with stage 1 through stage 4 chronic kidney disease, or unspecified chronic kidney disease: Secondary | ICD-10-CM

## 2018-02-12 LAB — BASIC METABOLIC PANEL
Anion gap: 12 (ref 5–15)
BUN: 21 mg/dL — ABNORMAL HIGH (ref 6–20)
CO2: 20 mmol/L — ABNORMAL LOW (ref 22–32)
Calcium: 8.2 mg/dL — ABNORMAL LOW (ref 8.9–10.3)
Chloride: 100 mmol/L — ABNORMAL LOW (ref 101–111)
Creatinine, Ser: 0.6 mg/dL (ref 0.44–1.00)
GFR calc Af Amer: >60 mL/min (ref >60)
GFR calc non Af Amer: >60 mL/min (ref >60)
Glucose, Bld: 169 mg/dL — ABNORMAL HIGH (ref 65–99)
Potassium: 3.9 mmol/L (ref 3.5–5.1)
Sodium: 132 mmol/L — ABNORMAL LOW (ref 135–145)

## 2018-02-12 LAB — CBC WITH DIFFERENTIAL/PLATELET
Basophils Absolute: 0 10*3/uL (ref 0–0.1)
Basophils Relative: 0 %
Eosinophils Absolute: 0.1 10*3/uL (ref 0–0.7)
Eosinophils Relative: 1 %
HCT: 27.6 % — ABNORMAL LOW (ref 35.0–47.0)
Hemoglobin: 9 g/dL — ABNORMAL LOW (ref 12.0–16.0)
Lymphocytes Relative: 2 %
Lymphs Abs: 0.3 10*3/uL — ABNORMAL LOW (ref 1.0–3.6)
MCH: 29.1 pg (ref 26.0–34.0)
MCHC: 32.7 g/dL (ref 32.0–36.0)
MCV: 89 fL (ref 80.0–100.0)
Monocytes Absolute: 1.2 10*3/uL — ABNORMAL HIGH (ref 0.2–0.9)
Monocytes Relative: 7 %
Neutro Abs: 15.4 10*3/uL — ABNORMAL HIGH (ref 1.4–6.5)
Neutrophils Relative %: 90 %
Platelets: 73 10*3/uL — ABNORMAL LOW (ref 150–440)
RBC: 3.11 MIL/uL — ABNORMAL LOW (ref 3.80–5.20)
RDW: 16.7 % — ABNORMAL HIGH (ref 11.5–14.5)
WBC: 17.1 10*3/uL — ABNORMAL HIGH (ref 3.6–11.0)

## 2018-02-12 LAB — APTT: aPTT: 41 seconds — ABNORMAL HIGH (ref 24–36)

## 2018-02-12 LAB — GLUCOSE, CAPILLARY
Glucose-Capillary: 177 mg/dL — ABNORMAL HIGH (ref 65–99)
Glucose-Capillary: 197 mg/dL — ABNORMAL HIGH (ref 65–99)

## 2018-02-12 LAB — ECHOCARDIOGRAM COMPLETE
Height: 65 in
Weight: 1888 oz

## 2018-02-12 LAB — PROTIME-INR
INR: 1.16
Prothrombin Time: 14.7 seconds (ref 11.4–15.2)

## 2018-02-12 NOTE — Care Management Important Message (Signed)
Important Message  Patient Details  Name: Julie Holland MRN: [id number] Date of Birth: Aug 06, 1941   Medicare Important Message Given:  Yes    [name] A Tyler Robidoux 02/12/2018, 1:36 PM

## 2018-02-12 NOTE — Progress Notes (Signed)
[facility] INFECTIOUS DISE[facility] PROGRESS NOTE Date of Admission:  02/10/2018     ID: ELFRIEDE BONINI is a 77 y.o. female with  MSSA bacteremia  Active Problems:   Cellulitis   Subjective: Hand feels better since surgery.   ROS  Eleven systems are reviewed and negative except per hpi  Medications:  Antibiotics Given (last 72 hours)    Date/Time Action Medication Dose Rate   02/10/18 1215 New Bag/Given   vancomycin (VANCOCIN) IVPB 1000 mg/200 mL premix 1,000 mg 200 mL/hr   02/11/18 0101 New Bag/Given   vancomycin (VANCOCIN) IVPB 750 mg/150 ml premix 750 mg 150 mL/hr   02/11/18 0300 New Bag/Given   ceFAZolin (ANCEF) IVPB 2g/100 mL premix 2 g 200 mL/hr   02/11/18 1407 New Bag/Given   ceFAZolin (ANCEF) IVPB 2g/100 mL premix 2 g 200 mL/hr   02/11/18 2234 New Bag/Given   ceFAZolin (ANCEF) IVPB 2g/100 mL premix 2 g 200 mL/hr   02/12/18 0606 New Bag/Given   ceFAZolin (ANCEF) IVPB 2g/100 mL premix 2 g 200 mL/hr   02/12/18 1504 New Bag/Given   ceFAZolin (ANCEF) IVPB 2g/100 mL premix 2 g 200 mL/hr     . aspirin  81 mg Oral Daily  . carvedilol  6.25 mg Oral BID WC  . docusate sodium  100 mg Oral BID  . insulin aspart  0-15 Units Subcutaneous TID WC  . insulin aspart  0-5 Units Subcutaneous QHS  . latanoprost  1 drop Right Eye QHS  . lisinopril  10 mg Oral Daily  . metFORMIN  850 mg Oral BID WC  . mometasone-formoterol  2 puff Inhalation BID  . multivitamin with minerals  2 tablet Oral Daily    Objective: Vital signs in last 24 hours: Temp:  [97.5 F (36.4 C)-100.1 F (37.8 C)] 97.8 F (36.6 C) (06/07 1442) Pulse Rate:  [73-100] 73 (06/07 1442) Resp:  [15-20] 20 (06/07 1442) BP: (126-158)/(62-88) 129/74 (06/07 1442) SpO2:  [92 %-100 %] 97 % (06/07 1442) Constitutional:  oriented to person, place, and time.Frail HENT: [place]/AT, PERRLA, no scleral icterus Mouth/Throat: Oropharynx is clear and moist. No oropharyngeal exudate.  Cardiovascular: Normal rate, regular rhythm and  normal heart sounds.  Pulmonary/Chest: Effort normal and breath sounds normal. No respiratory distress.  has no wheezes.  Neck = supple, no nuchal rigidity Abdominal: Soft. Bowel sounds are normal.  exhibits no distension. There is no tenderness.  Lymphadenopathy: no cervical adenopathy. No axillary adenopathy Ext R wrist wrappedNeurological: alert and oriented to person, place, and time.  Skin: Skin is warm and dry. No rash noted. No erythema.  Psychiatric: a normal mood and affect.  behavior is normal.     Lab Results Recent Labs    02/11/18 0646 02/12/18 0506  WBC 25.1* 17.1*  HGB 9.0* 9.0*  HCT 27.1* 27.6*  NA 127* 132*  K 3.4* 3.9  CL 97* 100*  CO2 22 20*  BUN 18 21*  CREATININE 0.60 0.60    Microbiology: @micro @ Results for orders placed or performed during the hospital encounter of 02/10/18  Blood culture (routine x 2)     Status: Abnormal (Preliminary result)   Collection Time: 02/10/18 11:22 AM  Result Value Ref Range Status   Specimen Description   Final    BLOOD BLOOD LEFT FOREARM Performed at [facility], [address] [place], [place] [address]    Special Requests   Final    BOTTLES DRAWN AEROBIC AND ANAEROBIC Blood Culture results may not be optimal  due to an excessive volume of blood received in culture bottles Performed at [facility], [address] [place], [place] [address]    Culture  Setup Time   Final    GRAM POSITIVE COCCI IN BOTH AEROBIC AND ANAEROBIC BOTTLES CRITICAL VALUE NOTED.  VALUE IS CONSISTENT WITH PREVIOUSLY REPORTED AND CALLED VALUE. Performed at [facility], [address] [place], [place] [address]    Culture STAPHYLOCOCCUS AUREUS (A)  Final   Report Status PENDING  Incomplete  Blood culture (routine x 2)     Status: Abnormal (Preliminary result)   Collection Time: 02/10/18 11:22 AM  Result Value Ref Range Status   Specimen Description   Final    BLOOD RIGHT ANTECUBITAL Performed at  [facility], [address], [place], [place] [address]    Special Requests   Final    BOTTLES DRAWN AEROBIC AND ANAEROBIC Blood Culture results may not be optimal due to an inadequate volume of blood received in culture bottles Performed at [facility], [address], [place], [place] [address]    Culture  Setup Time   Final    GRAM POSITIVE COCCI IN BOTH AEROBIC AND ANAEROBIC BOTTLES CRITICAL RESULT CALLED TO, READ BACK BY AND VERIFIED WITH: Timmie Dugue BESANTI ON 02/11/18 AT 0117 JAG    Culture (A)  Final    STAPHYLOCOCCUS AUREUS SUSCEPTIBILITIES TO FOLLOW Performed at [facility], [place] [address] [place], [place] [address]    Report Status PENDING  Incomplete  Blood Culture ID Panel (Reflexed)     Status: Abnormal   Collection Time: 02/10/18 11:22 AM  Result Value Ref Range Status   Enterococcus species NOT DETECTED NOT DETECTED Final   Listeria monocytogenes NOT DETECTED NOT DETECTED Final   Staphylococcus species DETECTED (A) NOT DETECTED Final    Comment: CRITICAL RESULT CALLED TO, READ BACK BY AND VERIFIED WITH: Malcome Ambrocio BESANTI ON 02/11/18 AT 0117 JAG    Staphylococcus aureus DETECTED (A) NOT DETECTED Final    Comment: Methicillin (oxacillin) susceptible Staphylococcus aureus (MSSA). Preferred therapy is anti staphylococcal beta lactam antibiotic (Cefazolin or Nafcillin), unless clinically contraindicated. CRITICAL RESULT CALLED TO, READ BACK BY AND VERIFIED WITH: Ruth Kovich BESANTI ON 02/11/18 AT 0117  JAG    Methicillin resistance NOT DETECTED NOT DETECTED Final   Streptococcus species NOT DETECTED NOT DETECTED Final   Streptococcus agalactiae NOT DETECTED NOT DETECTED Final   Streptococcus pneumoniae NOT DETECTED NOT DETECTED Final   Streptococcus pyogenes NOT DETECTED NOT DETECTED Final   Acinetobacter baumannii NOT DETECTED NOT DETECTED Final   Enterobacteriaceae species NOT DETECTED NOT DETECTED Final   Enterobacter cloacae complex NOT DETECTED NOT  DETECTED Final   Escherichia coli NOT DETECTED NOT DETECTED Final   Klebsiella oxytoca NOT DETECTED NOT DETECTED Final   Klebsiella pneumoniae NOT DETECTED NOT DETECTED Final   Proteus species NOT DETECTED NOT DETECTED Final   Serratia marcescens NOT DETECTED NOT DETECTED Final   Haemophilus influenzae NOT DETECTED NOT DETECTED Final   Neisseria meningitidis NOT DETECTED NOT DETECTED Final   Pseudomonas aeruginosa NOT DETECTED NOT DETECTED Final   Candida albicans NOT DETECTED NOT DETECTED Final   Candida glabrata NOT DETECTED NOT DETECTED Final   Candida krusei NOT DETECTED NOT DETECTED Final   Candida parapsilosis NOT DETECTED NOT DETECTED Final   Candida tropicalis NOT DETECTED NOT DETECTED Final    Comment: Performed at [facility], [address] [place], [place] [address]  Culture, blood (single) w Reflex to  ID Panel     Status: None (Preliminary result)   Collection Time: 02/11/18 10:21 AM  Result Value Ref Range Status   Specimen Description BLOOD LEFT ANTECUBITAL  Final   Special Requests   Final    BOTTLES DRAWN AEROBIC AND ANAEROBIC Blood Culture adequate volume   Culture   Final    NO GROWTH < 24 HOURS Performed at [facility], [address] [place], [place] [address]    Report Status PENDING  Incomplete  Aerobic/Anaerobic Culture (surgical/deep wound)     Status: None (Preliminary result)   Collection Time: 02/11/18  7:55 PM  Result Value Ref Range Status   Specimen Description   Final    HAND RIGHT HAND ABCESS Performed at [facility], [address], [place], [place] [address]    Special Requests   Final    NONE Performed at [facility], [address], [place], [place] [address]    Gram Stain   Final    RARE WBC PRESENT, PREDOMINANTLY PMN FEW GRAM POSITIVE COCCI IN PAIRS Performed at [facility], [place] [address], [place],  [address]    Culture PENDING  Incomplete   Report Status PENDING  Incomplete    Studies/Results: Mr Wrist Right W Wo Contrast  Result Date: 02/10/2018 CLINICAL DATA:  Diffuse right wrist and hand swelling since fall a week ago. Concern for cellulitis with possible abscess. EXAM: MR OF THE RIGHT WRIST WITHOUT AND WITH CONTRAST TECHNIQUE: Multiplanar multisequence MR imaging of the right wrist was performed both before and after the administration of intravenous contrast. CONTRAST:  33mL MULTIHANCE GADOBENATE DIMEGLUMINE 529 MG/ML IV SOLN COMPARISON:  Right hand x-rays from same day. FINDINGS: Despite efforts by the technologist and patient, motion artifact is present on today's exam and could not be eliminated. This reduces exam sensitivity and specificity. Ligaments: Intact scapholunate and lunotriquetral ligaments. Triangular fibrocartilage: Possible small central perforation of the TFCC articular disc. Tendons: Intact flexor and extensor compartment tendons. Mild enhancing tenosynovitis involving the extensor carpi radialis longus and brevis tendons. Carpal tunnel/median nerve: Normal carpal tunnel. Normal median nerve. Guyon's canal: Normal. Joint/cartilage: Small radiocarpal, midcarpal, and distal radioulnar joint effusions with enhancing synovitis. Large first [place] joint effusion. Bones/carpal alignment: Small scattered erosions involving the carpal bones and at the base of the first metacarpal. Subluxation and widening of the first [facility] joint. Second and third MCP joint osteoarthritis. Normal carpal alignment. No fracture. Other: There is a large rim enhancing fluid collection centered around the first [facility] joint this is difficult to measure, but measures at least 3.3 x 3.2 x 4.1 cm (AP by transverse by CC). The collection extends primarily into the volar soft tissues adjacent to the first metacarpal. There is diffuse soft tissue swelling about the dorsal and radial wrist. IMPRESSION: 1. Large rim enhancing fluid collection centered around the first [facility] joint, measuring up to 4.1 cm,  consistent with abscess. Large first [place] joint effusion, suspicious for septic arthritis. 2. Small radiocarpal, midcarpal, and distal radioulnar joint effusions with enhancing synovitis. Small scattered erosions involving the carpal bones. While inflammatory arthropathy could have this appearance, given the findings at the first [facility] joint, septic arthritis cannot be excluded. 3. Mild enhancing tenosynovitis involving the second extensor compartment, likely infectious in etiology. 4. Possible small central perforation of the TFCC articular disc. Electronically Signed   By: Titus Dubin M.D.   On: 02/10/2018 23:24    Assessment/Plan: ESTLE SABELLA is a 77 y.o. female  with MSSA bacteremia in setting of R wrist abscess after a  fall and injury. Agree with Dr Rudene Christians, likely a hematoma which became infected.  She has no intravascular devices, no prosthetic joints, no evidence metastatic infection at this point.  For surgery today. S/p surgery 6/6. Repeat bcx is pending and neg to date. Echo negative.  Recommendations Once BCX neg x 48 hours can place picc. Likely can place it tomorrow. No need for TEE since will need a 4-6 week course IV abx for the septic arthiritis. See abx order sheet.  Thank you very much for the consult. Will follow with you.  Leonel Ramsay   02/12/2018, 4:03 PM

## 2018-02-12 NOTE — Evaluation (Signed)
Physical Therapy Evaluation Patient Details Name: Julie Holland MRN: [id number] DOB: 05-Jul-1941 Today's Date: 02/12/2018   History of Present Illness  77 y.o.femalewith a known history of arthritis, perianal carcinoma in situ, CKD stage I, diabetes mellitus type 2, GERD, glaucoma, hemorrhoids, hyperlipidemia, HTN presented to the ED with pain and swelling in the right wrist.Patient had a fall at home secondary to loss of balance 1 week ago last Thursday.She fell on her outstretched right hand; imaging studies of the right upper extremity no fractures.Patient hasd redness and swelling around the right wrist and was tender to touch. Found to have abscess. POD#1 incision and drainage.     Clinical Impression  Pt is a pleasant 77 year old female who was admitted for cellulitis 02/10/18. Pt performs bed mobility, transfers, and ambulation with min assist and cuing for sequencing and safety. Pt demonstrates deficits with general weakness, mobility, and safety awareness. Pt was able to ambulate 68' with a RW and platform requiring min assistance. Pt has full sensation in B LE. Strength was grossly at least 3/5 in B upper and lower extremity however displays LE weakness by bent knee posture while ambulating. Pt states that she has 4/10 pain in her R wrist. Pt instructed in HEP to increase LE strength, exercises written on communication board and encourage to perform BID. Pt did report feelings of being light headed after supine<>sit however this quickly resolved, O2 saturation WNL. Pt tolerated treatment well. PT will continue attempting to work with pt during admission. At this time PT recommends d/c to home with home health PT.      Follow Up Recommendations Home health PT;Supervision/Assistance - 24 hour    Equipment Recommendations  (pt has a RW however needs a platform)    Recommendations for Other Services       Precautions / Restrictions Precautions Precautions:  Fall Restrictions Weight Bearing Restrictions: No RUE Weight Bearing: (use platform RW for R UE)      Mobility  Bed Mobility Overal bed mobility: Needs Assistance Bed Mobility: Supine to Sit     Supine to sit: Min assist Sit to supine: Supervision   General bed mobility comments: min assistance including cuing for sequencing to increase safety  Transfers Overall transfer level: Needs assistance Equipment used: Right platform walker Transfers: Sit to/from Stand Sit to Stand: Min assist         General transfer comment: pt required min assist including cuing for sequencing and safety. pt reported light headedness after transfer that subsided quickly. O2 saturation 95% while light headed, possibly due to medications  Ambulation/Gait Ambulation/Gait assistance: Min assist Ambulation Distance (Feet): 50 Feet Assistive device: Rolling walker (2 wheeled);Right platform walker Gait Pattern/deviations: Step-through pattern;Decreased stride length;Shuffle;Trunk flexed;Narrow base of support     General Gait Details: pt displays slow step through shuffling gait pattern. pt required continuous verbal and tactile cuing and assistance for use of RW. pt tended to push RW too far away from herself causing her to walk with a bent knee posture.  Stairs            Wheelchair Mobility    Modified Rankin (Stroke Patients Only)       Balance Overall balance assessment: Needs assistance Sitting-balance support: Single extremity supported;Feet supported Sitting balance-Leahy Scale: Good Sitting balance - Comments: B LE supported on floor sits unsupported EOB Postural control: Posterior lean Standing balance support: Single extremity supported Standing balance-Leahy Scale: Fair Standing balance comment: B UE support on RW using R platform, contact  guard to min assist while standing.                             Pertinent Vitals/Pain Pain Assessment: 0-10 Pain Score:  4  Pain Location: R hand Pain Descriptors / Indicators: Aching Pain Intervention(s): Limited activity within patient's tolerance;Monitored during session    [place] expects to be discharged to:: Private residence Living Arrangements: Spouse/significant other Available Help at Discharge: Family;Available 24 hours/day Type of Home: House Home Access: Stairs to enter Entrance Stairs-Rails: Left Entrance Stairs-Number of Steps: 3 Home Layout: One level Home Equipment: Walker - 2 wheels;Cane - single point      Prior Function Level of Independence: Independent with assistive device(s)         Comments: Pt amb w/ SPC primarily but has 2WW at home. history of 2 falls     Hand Dominance   Dominant Hand: Left    Extremity/Trunk Assessment   Upper Extremity Assessment Upper Extremity Assessment: Overall WFL for tasks assessed RUE Deficits / Details: UE strength grossly at least 3/5 RUE: Unable to fully assess due to immobilization;Unable to fully assess due to pain LUE Deficits / Details: grossly at least 3/5    Lower Extremity Assessment Lower Extremity Assessment: Generalized weakness(grossly at least 3/5 in B LE.)       Communication   Communication: No difficulties  Cognition Arousal/Alertness: Awake/alert Behavior During Therapy: WFL for tasks assessed/performed Overall Cognitive Status: Within Functional Limits for tasks assessed                                        General Comments General comments (skin integrity, edema, etc.): R UE bandaging in place    Exercises Other Exercises Other Exercises: Pt/spouse educated in falls prevention strategies  Other Exercises: Pt/spouse educated in role and benefit of skilled OT services Other Exercises: functional mobility training Other Exercises: pt instructed in seated B LAQ, SAQ, and marching x10 with demonstration and tactile cuing. Exercises written on board and encourage to  perform x10 twice daily. pt required min assist with exercises for proper technique.   Assessment/Plan    PT Assessment Patient needs continued PT services  PT Problem List Decreased strength;Decreased mobility;Decreased balance;Pain       PT Treatment Interventions DME instruction;Gait training;Stair training;Therapeutic exercise;Therapeutic activities;Balance training;Patient/family education    PT Goals (Current goals can be found in the Care Plan section)  Acute Rehab PT Goals Patient Stated Goal: to get better and remain as independent as possible PT Goal Formulation: With patient Time For Goal Achievement: 02/26/18 Potential to Achieve Goals: Good    Frequency Min 2X/week   Barriers to discharge        Co-evaluation               AM-PAC PT "6 Clicks" Daily Activity  Outcome Measure Difficulty turning over in bed (including adjusting bedclothes, sheets and blankets)?: Unable Difficulty moving from lying on back to sitting on the side of the bed? : Unable Difficulty sitting down on and standing up from a chair with arms (e.g., wheelchair, bedside commode, etc,.)?: Unable Help needed moving to and from a bed to chair (including a wheelchair)?: A Little Help needed walking in hospital room?: A Little Help needed climbing 3-5 steps with a railing? : A Lot 6 Click Score: 11    End  of Session Equipment Utilized During Treatment: Gait belt Activity Tolerance: Patient tolerated treatment well Patient left: in chair;with call bell/phone within reach;with chair alarm set   PT Visit Diagnosis: Unsteadiness on feet (R26.81);Other abnormalities of gait and mobility (R26.89);Repeated falls (R29.6);Muscle weakness (generalized) (M62.81);History of falling (Z91.81);Difficulty in walking, not elsewhere classified (R26.2);Pain Pain - Right/Left: Right Pain - part of body: Hand    Time: 3846-6599 PT Time Calculation (min) (ACUTE ONLY): 37 min   Charges:   PT Evaluation $PT  Eval Low Complexity: 1 Low PT Treatments $Therapeutic Exercise: 8-22 mins   PT G Codes:        Pj Zehner, SPT   Julie Holland 02/12/2018, 5:35 PM

## 2018-02-12 NOTE — Progress Notes (Addendum)
PHARMACY CONSULT NOTE FOR:  OUTPATIENT  PARENTERAL ANTIBIOTIC THERAPY (OPAT)  Indication: Osteomyelitis  Regimen: Cefazolin 2g IV q8h  End date: 03/25/18   IV antibiotic discharge orders are pended. To discharging provider:  please sign these orders via discharge navigator,  Select New Orders & click on the button choice - Manage This Unsigned Work.     Thank you for allowing pharmacy to be a part of this patient's care.  Candelaria Stagers, PharmD Pharmacy Resident  02/12/2018, 8:14 PM

## 2018-02-12 NOTE — Progress Notes (Signed)
*  PRELIMINARY RESULTS* Echocardiogram 2D Echocardiogram has been performed.  Julie Holland 02/12/2018, 11:15 AM

## 2018-02-12 NOTE — Progress Notes (Signed)
[facility] NAME: Julie Holland    MR#:  [id number]  DATE OF BIRTH:  July 31, 1941  SUBJECTIVE:   Came with pain, redness and swelling of right wrist after fall 1 week ago postop day one incision and drainage from the right hand secondary to abscess REVIEW OF SYSTEMS:   Review of Systems  Constitutional: Negative for chills, fever and weight loss.  HENT: Negative for ear discharge, ear pain and nosebleeds.   Eyes: Negative for blurred vision, pain and discharge.  Respiratory: Negative for sputum production, shortness of breath, wheezing and stridor.   Cardiovascular: Negative for chest pain, palpitations, orthopnea and PND.  Gastrointestinal: Negative for abdominal pain, diarrhea, nausea and vomiting.  Genitourinary: Negative for frequency and urgency.  Musculoskeletal: Positive for joint pain. Negative for back pain.  Neurological: Positive for weakness. Negative for sensory change, speech change and focal weakness.  Psychiatric/Behavioral: Negative for depression and hallucinations. The patient is not nervous/anxious.    Tolerating Diet:npo Tolerating PT: ambulatory  DRUG ALLERGIES:   Allergies  Allergen Reactions  . Hydrocodone Nausea And Vomiting and Other (See Comments)    Vertigo but patient takes hydrocodone/acetaminophen outpatient    VITALS:  Blood pressure 127/69, pulse 83, temperature 97.9 F (36.6 C), temperature source Oral, resp. rate 18, height 5\' 5"  (1.651 m), weight 53.5 kg (118 lb), SpO2 97 %.  PHYSICAL EXAMINATION:   Physical Exam  GENERAL:  76 y.o.-year-old patient lying in the bed with no acute distress.  EYES: Pupils equal, round, reactive to light and accommodation. No scleral icterus. Extraocular muscles intact.  HEENT: Head atraumatic, normocephalic. Oropharynx and nasopharynx clear.  NECK:  Supple, no jugular venous distention. No thyroid enlargement, no tenderness.  LUNGS: Normal breath  sounds bilaterally, no wheezing, rales, rhonchi. No use of accessory muscles of respiration.  CARDIOVASCULAR: S1, S2 normal. No murmurs, rubs, or gallops.  ABDOMEN: Soft, nontender, nondistended. Bowel sounds present. No organomegaly or mass.  EXTREMITIES:right wrist/handdressing ++ NEUROLOGIC: Cranial nerves II through XII are intact. No focal Motor or sensory deficits b/l.   PSYCHIATRIC:  patient is alert and oriented x 3.  SKIN: No obvious rash, lesion, or ulcer.   LABORATORY PANEL:  CBC Recent Labs  Lab 02/12/18 0506  WBC 17.1*  HGB 9.0*  HCT 27.6*  PLT 73*    Chemistries  Recent Labs  Lab 02/11/18 0646  NA 127*  K 3.4*  CL 97*  CO2 22  GLUCOSE 152*  BUN 18  CREATININE 0.60  CALCIUM 8.0*  AST 68*  ALT 90*  ALKPHOS 142*  BILITOT 0.8   Cardiac Enzymes Recent Labs  Lab 02/10/18 1033  TROPONINI <0.03   RADIOLOGY:  Dg Wrist Complete Right  Result Date: 02/10/2018 CLINICAL DATA:  Acute RIGHT wrist pain following fall 1 week ago. Initial encounter. EXAM: RIGHT WRIST - COMPLETE 3+ VIEW COMPARISON:  02/07/2018 FINDINGS: No acute fracture, subluxation or dislocation identified. Dorsal soft tissue swelling noted. Chondrocalcinosis again identified. Degenerative changes at the first carpometacarpal joint again noted. IMPRESSION: Soft tissue swelling without acute bony abnormality. Degenerative changes and chondrocalcinosis. Electronically Signed   By: Margarette Canada M.D.   On: 02/10/2018 12:14   Mr Wrist Right W Wo Contrast  Result Date: 02/10/2018 CLINICAL DATA:  Diffuse right wrist and hand swelling since fall a week ago. Concern for cellulitis with possible abscess. EXAM: MR OF THE RIGHT WRIST WITHOUT AND WITH CONTRAST TECHNIQUE: Multiplanar multisequence MR imaging of the right wrist  was performed both before and after the administration of intravenous contrast. CONTRAST:  9mL MULTIHANCE GADOBENATE DIMEGLUMINE 529 MG/ML IV SOLN COMPARISON:  Right hand x-rays from same day.  FINDINGS: Despite efforts by the technologist and patient, motion artifact is present on today's exam and could not be eliminated. This reduces exam sensitivity and specificity. Ligaments: Intact scapholunate and lunotriquetral ligaments. Triangular fibrocartilage: Possible small central perforation of the TFCC articular disc. Tendons: Intact flexor and extensor compartment tendons. Mild enhancing tenosynovitis involving the extensor carpi radialis longus and brevis tendons. Carpal tunnel/median nerve: Normal carpal tunnel. Normal median nerve. Guyon's canal: Normal. Joint/cartilage: Small radiocarpal, midcarpal, and distal radioulnar joint effusions with enhancing synovitis. Large first Strong joint effusion. Bones/carpal alignment: Small scattered erosions involving the carpal bones and at the base of the first metacarpal. Subluxation and widening of the first [facility] joint. Second and third MCP joint osteoarthritis. Normal carpal alignment. No fracture. Other: There is a large rim enhancing fluid collection centered around the first [facility] joint this is difficult to measure, but measures at least 3.3 x 3.2 x 4.1 cm (AP by transverse by CC). The collection extends primarily into the volar soft tissues adjacent to the first metacarpal. There is diffuse soft tissue swelling about the dorsal and radial wrist. IMPRESSION: 1. Large rim enhancing fluid collection centered around the first [facility] joint, measuring up to 4.1 cm, consistent with abscess. Large first Gu-Win joint effusion, suspicious for septic arthritis. 2. Small radiocarpal, midcarpal, and distal radioulnar joint effusions with enhancing synovitis. Small scattered erosions involving the carpal bones. While inflammatory arthropathy could have this appearance, given the findings at the first [facility] joint, septic arthritis cannot be excluded. 3. Mild enhancing tenosynovitis involving the second extensor compartment, likely infectious in etiology. 4. Possible small central  perforation of the TFCC articular disc. Electronically Signed   By: Titus Dubin M.D.   On: 02/10/2018 23:24   Dg Hand Complete Right  Result Date: 02/10/2018 CLINICAL DATA:  Acute RIGHT hand pain following fall 1 week ago. Initial encounter. EXAM: RIGHT HAND - COMPLETE 3+ VIEW COMPARISON:  02/07/2018 and 10/08/2015 FINDINGS: There is no evidence of acute fracture, subluxation or dislocation. Dorsal soft tissue swelling identified. Wrist chondrocalcinosis noted. Degenerative changes at the index and middle finger PIP joints and first carpometacarpal joint noted. IMPRESSION: Soft tissue swelling without acute bony abnormality. Electronically Signed   By: Margarette Canada M.D.   On: 02/10/2018 12:16   ASSESSMENT AND PLAN:   Julie Holland  is a 77 y.o. female with a known history of arthritis, perianal carcinoma in situ, chronic kidney disease stage I, diabetes mellitus type 2, GERD, glaucoma, hemorrhoids, hyperlipidemia, hypertension presented to the emergency room with pain and swelling in the right wrist.  Patient had a fall at home secondary to loss of balance 1 week ago last Thursday.  She fell on her outstretched right hand imaging studies of the right upper extremity no fractures.  Patient has redness and swelling around the right wrist and is tender to touch  *Right wrist cellulitis/abscess with BC positive for MSSA  -postop day one incision and drainage by Dr. Rudene Christians -change to IV cefazolin -ID input appreciated -will need PICC line for 4 weeks IV abxs -repeat blood cultures today on June 7 -TTE today MR I of the right wrist positive for abscess Orthopedic surgery consultation with Dr Rudene Christians for evaluation of the right wrist for  incision and drainage-- postop day one  -Thrombocytopenia Significant drop in platelet count Could be  reactive due to sepsis -platelet count improving 66K--73K  - mild elevation in liver function tests F/U liver function tests  -Acute hypokalemia--  repeated Replace potassium orally  -Acute hyponatremia  IV fluid hydration and monitor electrolytes  -Sepsis secondary to cellulitis from the wrist IV antibiotics and IV fluids    d/w dr Rudene Christians, pt and husband Case discussed with Care Management/Social Worker. Management plans discussed with the patient, family and they are in agreement.  CODE STATUS: full  DVT Prophylaxis: scd  TOTAL TIME TAKING CARE OF THIS PATIENT: *30* minutes.  >50% time spent on counselling and coordination of care  POSSIBLE D/C IN 2-3 DAYS, DEPENDING ON CLINICAL CONDITION.  Note: This dictation was prepared with Dragon dictation along with smaller phrase technology. Any transcriptional errors that result from this process are unintentional.  Fritzi Mandes M.D on 02/12/2018 at 10:20 AM  Between 7am to 6pm - Pager - [phone]  After 6pm go to [email or web address] - Proofreader  [facility]  Office  [phone]  CC: Primary care physician; [name], MDPatient ID: Julie Holland, female   DOB: 1941/02/15, 77 y.o.   MRN: [id number]

## 2018-02-12 NOTE — Progress Notes (Signed)
Subjective: Patient does not report a great pain at present although she has had a large amount of serosanguineous drainage overnight as expected   Objective: Vital signs in last 24 hours: Temp:  [97.5 F (36.4 C)-100.1 F (37.8 C)] 97.5 F (36.4 C) (06/07 0452) Pulse Rate:  [74-100] 74 (06/07 0452) Resp:  [15-20] 18 (06/07 0452) BP: (126-158)/(62-88) 154/88 (06/07 0452) SpO2:  [92 %-100 %] 100 % (06/07 0452)  Intake/Output from previous day: 06/06 0701 - 06/07 0700 In: 1561.7 [P.O.:240; I.V.:1221.7; IV Piggyback:100] Out: -  Intake/Output this shift: No intake/output data recorded.  Recent Labs    02/10/18 1033 02/11/18 0646 02/12/18 0506  HGB 9.4* 9.0* 9.0*   Recent Labs    02/11/18 0646 02/12/18 0506  WBC 25.1* 17.1*  RBC 3.07* 3.11*  HCT 27.1* 27.6*  PLT 66* 73*   Recent Labs    02/10/18 1033 02/11/18 0646  NA 126* 127*  K 3.2* 3.4*  CL 92* 97*  CO2 24 22  BUN 25* 18  CREATININE 0.83 0.60  GLUCOSE 276* 152*  CALCIUM 8.2* 8.0*   Recent Labs    02/12/18 0506  INR 1.16    Her hand is in a bulky wrap she is able to move the fingers slightly but range of motion is limited by the bandage.  There is expected drainage throughout this.  Plan on removing bandage tomorrow and the drains should come out at that time have the hand rewrapped  Assessment/Plan: Agree with IV antibiotics at home there was extensive infection in the [facility] joint of the thumb as well as volar and dorsal hand and wrist.  Also will order outpatient occupational therapy think she will need that to regain and function   Julie Holland 02/12/2018, 7:28 AM

## 2018-02-12 NOTE — Evaluation (Signed)
Occupational Therapy Evaluation Patient Details Name: Julie Holland MRN: [id number] DOB: 20-Mar-1941 Today's Date: 02/12/2018    History of Present Illness 77 y.o.femalewith a known history of arthritis, perianal carcinoma in situ, CKD stage I, diabetes mellitus type 2, GERD, glaucoma, hemorrhoids, hyperlipidemia, HTN presented to the ED with pain and swelling in the right wrist.Patient had a fall at home secondary to loss of balance 1 week ago last Thursday.She fell on her outstretched right hand; imaging studies of the right upper extremity no fractures.Patient hasd redness and swelling around the right wrist and was tender to touch. Found to have abscess. POD#1 incision and drainage.    Clinical Impression   Pt seen for OT evaluation this date. Prior to hospital admission, pt was modified independent, using a SPC vs 2WW for ambulation, indep with ADL and IADL (spouse does most of the driving, and endorses 2 falls in past 12 months 2:2 LOB.  Pt lives with her spouse in a 1 story home with 3 steps and L hand rail to enter from the front. Orthostatic vitals taken, all VSS and WNL t/o session.  Currently pt demonstrates impairments in R hand pain (weightbearing through R hand avoided for comfort), R hand ROM/strength, activity tolerance, and balance requiring supervision/SBA for bed mobility and min-mod assist in standing for balance with SPC and attempts to march in place with pt continuously bracing her BLE against the EOB and noted posterior lean despite mod verbal cues to correct. Pt not at baseline mobility status currently. RNCM made aware, PT consult ordered. Pt may benefit from platform walker if unable to grasp RW handle with R hand.  Pt would benefit from skilled OT to address noted impairments and functional limitations (see below for any additional details) in order to maximize safety and independence while minimizing falls risk and caregiver burden.  Upon hospital discharge, recommend pt  discharge to home with family assist and [facility] services to address falls prevention/home safety and RUE ROM/strengthing for functional use.    Follow Up Recommendations  Home health OT    Equipment Recommendations  3 in 1 bedside commode    Recommendations for Other Services PT consult     Precautions / Restrictions Precautions Precautions: Fall Restrictions Weight Bearing Restrictions: No      Mobility Bed Mobility Overal bed mobility: Needs Assistance Bed Mobility: Supine to Sit;Sit to Supine     Supine to sit: Supervision Sit to supine: Supervision   General bed mobility comments: additional time/effort to perform, close supervision for safety to ensure no LOB  Transfers Overall transfer level: Needs assistance Equipment used: Straight cane Transfers: Sit to/from Stand Sit to Stand: Min assist;Mod assist         General transfer comment: VC for sequencing to improve transfer including scooting EOB, feet positioning, and L hand placement on EOB for support    Balance Overall balance assessment: Needs assistance Sitting-balance support: Single extremity supported;Feet supported Sitting balance-Leahy Scale: Fair   Postural control: Posterior lean Standing balance support: Single extremity supported Standing balance-Leahy Scale: Poor Standing balance comment: upon standing, braces BLE against EOB, heavy posterior lean initially and decreases with verbal cues, weight bears through heels, min assist for static standing, min-mod assist for dynamic standing balance, difficulty with weight shifts and significant difficulty with marching in place                           ADL either performed or assessed with clinical  judgement   ADL Overall ADL's : Needs assistance/impaired Eating/Feeding: Sitting;Set up Eating/Feeding Details (indicate cue type and reason): set up for opening packets/containers 2:2 impaired RUE use for bilat tasks Grooming: Sitting;Set  up   Upper Body Bathing: Sitting;Minimal assistance;With caregiver independent assisting Upper Body Bathing Details (indicate cue type and reason): instructed in seated sponge bath, caregiver able to assist Lower Body Bathing: Sitting/lateral leans;Minimal assistance;Moderate assistance;With caregiver independent assisting Lower Body Bathing Details (indicate cue type and reason): instructed in seated sponge bath, caregiver able to assist Upper Body Dressing : Sitting;Minimal assistance;With caregiver independent assisting   Lower Body Dressing: Sit to/from stand;Minimal assistance;Moderate assistance;With caregiver independent assisting   Toilet Transfer: Stand-pivot;Moderate assistance;BSC           Functional mobility during ADLs: (unsafe to attempt away from bed)       Vision Baseline Vision/History: Wears glasses;Glaucoma Wears Glasses: Reading only Patient Visual Report: No change from baseline Vision Assessment?: No apparent visual deficits     Perception     Praxis      Pertinent Vitals/Pain Pain Assessment: 0-10 Pain Score: 4  Pain Location: R hand Pain Descriptors / Indicators: Aching Pain Intervention(s): Limited activity within patient's tolerance;Monitored during session;Repositioned     Hand Dominance Left   Extremity/Trunk Assessment Upper Extremity Assessment Upper Extremity Assessment: RUE deficits/detail;LUE deficits/detail;Generalized weakness RUE Deficits / Details: shoulder flexion 4/5, intact sensation, able to wiggle fingers but ROM limited 2:2 bandaging RUE: Unable to fully assess due to immobilization;Unable to fully assess due to pain LUE Deficits / Details: grossly 4/5 t/o   Lower Extremity Assessment Lower Extremity Assessment: Defer to PT evaluation;Generalized weakness(at least 3+/5 bilaterally)       Communication Communication Communication: No difficulties   Cognition Arousal/Alertness: Awake/alert Behavior During Therapy: WFL  for tasks assessed/performed Overall Cognitive Status: Within Functional Limits for tasks assessed                                     General Comments  R UE bandaging in place    Exercises Other Exercises Other Exercises: Pt/spouse educated in falls prevention strategies  Other Exercises: Pt/spouse educated in role and benefit of skilled OT services Other Exercises: functional mobility training   Shoulder Instructions      Home Living Family/patient expects to be discharged to:: Private residence Living Arrangements: Spouse/significant other Available Help at Discharge: Family;Available 24 hours/day(spouse 24/7, adult sons live locally and could provide physical assist) Type of Home: House Home Access: Stairs to enter [employer] of Steps: 3 Entrance Stairs-Rails: Left Home Layout: One level     Bathroom Shower/Tub: Occupational psychologist: Standard     Home Equipment: Environmental consultant - 2 wheels;Cane - single point          Prior Functioning/Environment Level of Independence: Independent with assistive device(s)        Comments: Pt amb w/ SPC primarily but has 2WW at home. 2 falls in past 12 months 2:2 LOB. Pt indep with ADL, drives occasionally (spouse prefers to drive most often), and indep with cooking/cleaning, and med mgt.        OT Problem List: Decreased strength;Decreased knowledge of use of DME or AE;Decreased range of motion;Impaired UE functional use;Pain;Decreased activity tolerance;Impaired balance (sitting and/or standing)      OT Treatment/Interventions: Self-care/ADL training;Balance training;Therapeutic exercise;Therapeutic activities;DME and/or AE instruction;Patient/family education    OT Goals(Current goals can be found  in the care plan section) Acute Rehab OT Goals Patient Stated Goal: to get better and remain as independent as possible OT Goal Formulation: With patient/family Time For Goal Achievement:  02/26/18 Potential to Achieve Goals: Good ADL Goals Pt Will Perform Lower Body Dressing: sit to/from stand;with caregiver independent in assisting Pt Will Transfer to Toilet: ambulating;with min guard assist;bedside commode(LRAD for ambulation)  OT Frequency: Min 2X/week   Barriers to D/C:            Co-evaluation              AM-PAC PT "6 Clicks" Daily Activity     Outcome Measure Help from another person eating meals?: A Little Help from another person taking care of personal grooming?: A Little Help from another person toileting, which includes using toliet, bedpan, or urinal?: A Little Help from another person bathing (including washing, rinsing, drying)?: A Little Help from another person to put on and taking off regular upper body clothing?: A Little Help from another person to put on and taking off regular lower body clothing?: A Little 6 Click Score: 18   End of Session Equipment Utilized During Treatment: Gait belt  Activity Tolerance: Patient tolerated treatment well Patient left: in bed;with call bell/phone within reach;with bed alarm set;with nursing/sitter in room;with family/visitor present  OT Visit Diagnosis: Unsteadiness on feet (R26.81);Repeated falls (R29.6);Muscle weakness (generalized) (M62.81);Pain Pain - Right/Left: Right Pain - part of body: Hand                Time: 1330-1440 OT Time Calculation (min): 70 min Charges:  OT General Charges $OT Visit: 1 Visit OT Evaluation $OT Eval Moderate Complexity: 1 Mod OT Treatments $Self Care/Home Management : 38-52 mins  Jeni Salles, MPH, MS, OTR/L ascom [phone] 02/12/18, 3:48 PM

## 2018-02-12 NOTE — Progress Notes (Signed)
Infectious Disease Long Term IV Antibiotic Orders Julie Holland [birth year]  Diagnosis: MSSA bacteremia, and wrist septic arthritis  Culture results Description BLOOD BLOOD LEFT FOREARM  Performed at [facility], [address] [place], [place] [address]     Special Requests BOTTLES DRAWN AEROBIC AND ANAEROBIC Blood Culture results may not be optimal due to an excessive volume of blood received in culture bottles  Performed at [facility], [address], [place], [place] [address]     Culture Setup Time GRAM POSITIVE COCCI  IN BOTH AEROBIC AND ANAEROBIC BOTTLES  CRITICAL VALUE NOTED. VALUE IS CONSISTENT WITH PREVIOUSLY REPORTED AND CALLED VALUE.  Performed at [facility], [address] [place], [place] [address]     Culture STAPHYLOCOCCUS AUREUSAbnormal    Report Status PENDING      LABS Lab Results  Component Value Date   CREATININE 0.60 02/12/2018   Lab Results  Component Value Date   WBC 17.1 (H) 02/12/2018   HGB 9.0 (L) 02/12/2018   HCT 27.6 (L) 02/12/2018   MCV 89.0 02/12/2018   PLT 73 (L) 02/12/2018   No results found for: ESRSEDRATE, POCTSEDRATE No results found for: CRP  Allergies:  Allergies  Allergen Reactions  . Hydrocodone Nausea And Vomiting and Other (See Comments)    Vertigo but patient takes hydrocodone/acetaminophen outpatient    Discharge antibiotics Cefazolin      2 grams every 8    hours  PICC Care per protocol Labs weekly while on IV antibiotics -FAX weekly labs to [phone] CBC w diff   cr  Esr crp  Planned duration of antibiotics 6 weeks  Stop date July 18   Follow up clinic date 3-4 weeks   Leonel Ramsay, MD

## 2018-02-13 ENCOUNTER — Inpatient Hospital Stay: Payer: Self-pay

## 2018-02-13 LAB — CBC WITH DIFFERENTIAL/PLATELET
Basophils Absolute: 0.1 10*3/uL (ref 0–0.1)
Basophils Relative: 1 %
Eosinophils Absolute: 0.9 10*3/uL — ABNORMAL HIGH (ref 0–0.7)
Eosinophils Relative: 8 %
HCT: 23 % — ABNORMAL LOW (ref 35.0–47.0)
Hemoglobin: 7.6 g/dL — ABNORMAL LOW (ref 12.0–16.0)
Lymphocytes Relative: 4 %
Lymphs Abs: 0.4 10*3/uL — ABNORMAL LOW (ref 1.0–3.6)
MCH: 29 pg (ref 26.0–34.0)
MCHC: 33.1 g/dL (ref 32.0–36.0)
MCV: 87.5 fL (ref 80.0–100.0)
Monocytes Absolute: 1.8 10*3/uL — ABNORMAL HIGH (ref 0.2–0.9)
Monocytes Relative: 15 %
Neutro Abs: 8.6 10*3/uL — ABNORMAL HIGH (ref 1.4–6.5)
Neutrophils Relative %: 72 %
Platelets: 79 10*3/uL — ABNORMAL LOW (ref 150–440)
RBC: 2.63 MIL/uL — ABNORMAL LOW (ref 3.80–5.20)
RDW: 16.7 % — ABNORMAL HIGH (ref 11.5–14.5)
WBC: 11.7 10*3/uL — ABNORMAL HIGH (ref 3.6–11.0)

## 2018-02-13 LAB — CULTURE, BLOOD (ROUTINE X 2)

## 2018-02-13 LAB — GLUCOSE, POCT (MANUAL RESULT ENTRY)
POC Glucose: 144 mg/dl — AB (ref 70–99)
POC Glucose: 117 mg/dl — AB (ref 70–99)

## 2018-02-13 LAB — C-REACTIVE PROTEIN: CRP: 17.2 mg/dL — ABNORMAL HIGH (ref <1.0)

## 2018-02-13 LAB — GLUCOSE, CAPILLARY
Glucose-Capillary: 113 mg/dL — ABNORMAL HIGH (ref 65–99)
Glucose-Capillary: 166 mg/dL — ABNORMAL HIGH (ref 65–99)

## 2018-02-13 LAB — SEDIMENTATION RATE: Sed Rate: 108 mm/hr — ABNORMAL HIGH (ref 0–30)

## 2018-02-13 MED ORDER — SODIUM CHLORIDE 0.9% FLUSH
3.0000 mL | Freq: Two times a day (BID) | INTRAVENOUS | Status: DC
  Administered 2018-02-13: 3 mL via INTRAVENOUS

## 2018-02-13 MED ORDER — OXYCODONE-ACETAMINOPHEN 5-325 MG PO TABS: 1.0000 | ORAL_TABLET | Freq: Four times a day (QID) | ORAL | 0 refills | Status: DC | PRN

## 2018-02-13 MED ORDER — CEFAZOLIN SODIUM-DEXTROSE 2-4 GM/100ML-% IV SOLN: 2.0000 g | Freq: Three times a day (TID) | INTRAVENOUS | 0 refills | Status: DC

## 2018-02-13 MED ORDER — SODIUM CHLORIDE 0.9% FLUSH
10.0000 mL | INTRAVENOUS | Status: DC | PRN
  Administered 2018-02-13: 15:00:00 10 mL
  Filled 2018-02-13: qty 40

## 2018-02-13 MED ORDER — CEFAZOLIN IV (FOR PTA / DISCHARGE USE ONLY): 2.0000 g | Freq: Three times a day (TID) | INTRAVENOUS | 0 refills | Status: DC

## 2018-02-13 NOTE — Discharge Summary (Addendum)
[facility] NAME: Lilla Callejo    MR#:  [id number]  DATE OF BIRTH:  10/16/1940  DATE OF ADMISSION:  02/10/2018 ADMITTING PHYSICIAN: Saundra Shelling, MD  DATE OF DISCHARGE: 02/13/2018  PRIMARY CARE PHYSICIAN: [name], MD    ADMISSION DIAGNOSIS:  Dehydration [E86.0] Lactic acidosis [E87.2] Cellulitis of right upper extremity [G18.299]  DISCHARGE DIAGNOSIS:  MSSA sepsis in the setting of right wrist abscess after a fall and injury status post incision and drainage acute thrombocytopenia suspected due to sepsis improving SECONDARY DIAGNOSIS:   Past Medical History:  Diagnosis Date  . Arthritis   . Asthma   . 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  . Cataract   . Chronic kidney disease    stage 1  . Diabetes mellitus    type II  . Dry eye of right side   . GERD (gastroesophageal reflux disease)    OCC  . Glaucoma 2018   RIGHT EYE   . Hemorrhoids   . Hyperlipidemia   . Hypertension     HOSPITAL COURSE:   EdithCaudillis a77 y.o.femalewith a known history of arthritis, perianal carcinoma in situ, chronic kidney disease stage I, diabetes mellitus type 2, GERD, glaucoma, hemorrhoids, hyperlipidemia, hypertension presented to the emergency room with pain and swelling in the right wrist.Patient had a fall at home secondary to loss of balance 1 week ago last Thursday.She fell on her outstretched right hand imaging studies of the right upper extremity no fractures.Patient has redness and swelling around the right wrist and is tender to touch  *Sepsis due to Right wrist cellulitis/abscess -bragging about BC positive for MSSA  -postop day # 2 ncision and drainage by Dr. Rudene Christians -changed to IV cefazolin G-ID input appreciated -will need PICC line for 6 weeks IV abxs -repeat blood cultures  Negative so far from  June 7 -TTE --no vegetation MR I  of the right wrist positive for abscess  - Acute Thrombocytopenia Significant drop in platelet count Could be reactive due to sepsis -platelet count improving 66K--73K--79K no active bleeding sliding scale and continue renal diet orange juice with  -mild elevation in liver function tests F/Uliver function tests -no abdominal pain  -Acute hypokalemia-- repleted Replaced  With oral potassium   -Acute hyponatremia received IV fluid hydration  Na 132  d/w dr Rudene Christians, pt and husband   CONSULTS OBTAINED:  Treatment Team:  Lequita Asal, MD Hessie Knows, MD Leonel Ramsay, MD  DRUG ALLERGIES:   Allergies  Allergen Reactions  . Hydrocodone Nausea And Vomiting and Other (See Comments)    Vertigo but patient takes hydrocodone/acetaminophen outpatient    DISCHARGE MEDICATIONS:   Allergies as of 02/13/2018      Reactions   Hydrocodone Nausea And Vomiting, Other (See Comments)   Vertigo but patient takes hydrocodone/acetaminophen outpatient      Medication List    STOP taking these medications   Naproxen 375 MG Tbec     TAKE these medications   ACCU-CHEK SMARTVIEW test strip Generic drug:  glucose blood USE AS INSTRUCTED TO CHECK  BLOOD GLUCOSE ONCE DAILY if desired; LON 99 months; Dx E11.9   acetaminophen 500 MG tablet Commonly known as:  TYLENOL Take 500-1,000 mg by mouth every 6 (six) hours as needed (for pain).   ADVAIR DISKUS 100-50 MCG/DOSE Aepb Generic drug:  Fluticasone-Salmeterol USE 1 PUFF TWO TIMES DAILY   albuterol  108 (90 Base) MCG/ACT inhaler Commonly known as:  PROVENTIL HFA;VENTOLIN HFA Inhale 2 puffs into the lungs every 6 (six) hours as needed for wheezing or shortness of breath.   aspirin 81 MG chewable tablet Chew 81 mg by mouth daily.   butalbital-acetaminophen-caffeine 50-325-40 MG tablet Commonly known as:  FIORICET, ESGIC Take 1-2 tablets by mouth every 6 (six) hours as needed for headache.   carvedilol 6.25 MG  tablet Commonly known as:  COREG Take 1 tablet (6.25 mg total) by mouth 2 (two) times daily with a meal.   ceFAZolin IVPB Commonly known as:  ANCEF Inject 2 g into the vein every 8 (eight) hours. Indication: Osteomyelitis  Last Day of Therapy: 03/25/18 Labs - Once weekly:  CBC/D and BMP, Labs - Every other week:  ESR and CRP   ESTROVEN PO Take 1 tablet by mouth daily.   latanoprost 0.005 % ophthalmic solution Commonly known as:  XALATAN Place 1 drop at bedtime into the right eye.   lisinopril 10 MG tablet Commonly known as:  PRINIVIL,ZESTRIL Take 1 tablet (10 mg total) by mouth daily.   metFORMIN 850 MG tablet Commonly known as:  GLUCOPHAGE Take 1 tablet (850 mg total) by mouth 2 (two) times daily with a meal.   multivitamin tablet Take 2 tablets by mouth daily.   oxyCODONE-acetaminophen 5-325 MG tablet Commonly known as:  PERCOCET/ROXICET Take 1 tablet by mouth every 6 (six) hours as needed for moderate pain or severe pain.   REFRESH OPTIVE ADVANCED OP Place 1 drop into both eyes 3 (three) times daily as needed (for dry/irritated eyes).   simvastatin 20 MG tablet Commonly known as:  ZOCOR TAKE 1 TABLET BY MOUTH AT  BEDTIME            Home Infusion Instuctions  (From admission, onward)        Start     Ordered   02/13/18 0000  Home infusion instructions [facility] May follow [facility] Dosing Protocol; May administer Cathflo as needed to maintain patency of vascular access device.; Flushing of vascular access device: per [facility] Protocol: 0.9% NaCl pre/post medica...    Question Answer Comment  Instructions May follow [facility] Dosing Protocol   Instructions May administer Cathflo as needed to maintain patency of vascular access device.   Instructions Flushing of vascular access device: per [facility] Protocol: 0.9% NaCl pre/post medication administration and prn patency; Heparin 100 u/ml, 80m for implanted ports and Heparin 10u/ml, 517mfor all other central  venous catheters.   Instructions May follow [facility] Anaphylaxis Protocol for First Dose Administration in the home: 0.9% NaCl at 25-50 ml/hr to maintain IV access for protocol meds. Epinephrine 0.3 ml IV/IM PRN and Benadryl 25-50 IV/IM PRN s/s of anaphylaxis.   Instructions [facility] Infusion Coordinator (RN) to assist per patient IV care needs in the home PRN.      02/13/18 1005       Durable Medical Equipment  (From admission, onward)        Start     Ordered   02/13/18 1001  For home use only DME Walker platform  Once    Comments:  Platform piece only  Question:  Patient needs a walker to treat with the following condition  Answer:  Weakness   02/13/18 1000      If you experience worsening of your admission symptoms, develop shortness of breath, life threatening emergency, suicidal or homicidal thoughts you must seek medical attention immediately by calling  911 or calling your MD immediately  if symptoms less severe.  You Must read complete instructions/literature along with all the possible adverse reactions/side effects for all the Medicines you take and that have been prescribed to you. Take any new Medicines after you have completely understood and accept all the possible adverse reactions/side effects.   Please note  You were cared for by a hospitalist during your hospital stay. If you have any questions about your discharge medications or the care you received while you were in the hospital after you are discharged, you can call the unit and asked to speak with the hospitalist on call if the hospitalist that took care of you is not available. Once you are discharged, your primary care physician will handle any further medical issues. Please note that NO REFILLS for any discharge medications will be authorized once you are discharged, as it is imperative that you return to your primary care physician (or establish a relationship with a primary care physician if you do not have  one) for your aftercare needs so that they can reassess your need for medications and monitor your lab values. Today   SUBJECTIVE   Doing well  VITAL SIGNS:  Blood pressure (!) 144/77, pulse 89, temperature 98.4 F (36.9 C), temperature source Oral, resp. rate 16, height 5' 5"  (1.651 m), weight 53.5 kg (118 lb), SpO2 99 %.  I/O:    Intake/Output Summary (Last 24 hours) at 02/13/2018 1625 Last data filed at 02/13/2018 1403 Gross per 24 hour  Intake 580 ml  Output -  Net 580 ml    PHYSICAL EXAMINATION:  GENERAL:  77 y.o.-year-old patient lying in the bed with no acute distress.  EYES: Pupils equal, round, reactive to light and accommodation. No scleral icterus. Extraocular muscles intact.  HEENT: Head atraumatic, normocephalic. Oropharynx and nasopharynx clear.  NECK:  Supple, no jugular venous distention. No thyroid enlargement, no tenderness.  LUNGS: Normal breath sounds bilaterally, no wheezing, rales,rhonchi or crepitation. No use of accessory muscles of respiration.  CARDIOVASCULAR: S1, S2 normal. No murmurs, rubs, or gallops.  ABDOMEN: Soft, non-tender, non-distended. Bowel sounds present. No organomegaly or mass.  EXTREMITIES: No pedal edema, cyanosis, or clubbing. Right UE Dressing+ NEUROLOGIC: Cranial nerves II through XII are intact. Muscle strength 5/5 in all extremities. Sensation intact. Gait not checked.  PSYCHIATRIC:  patient is alert and oriented x 3.  SKIN: No obvious rash, lesion, or ulcer.   DATA REVIEW:   CBC  Recent Labs  Lab 02/13/18 0500  WBC 11.7*  HGB 7.6*  HCT 23.0*  PLT 79*    Chemistries  Recent Labs  Lab 02/11/18 0646 02/12/18 0506  NA 127* 132*  K 3.4* 3.9  CL 97* 100*  CO2 22 20*  GLUCOSE 152* 169*  BUN 18 21*  CREATININE 0.60 0.60  CALCIUM 8.0* 8.2*  AST 68*  --   ALT 90*  --   ALKPHOS 142*  --   BILITOT 0.8  --     Microbiology Results   Recent Results (from the past 240 hour(s))  Blood culture (routine x 2)     Status:  Abnormal   Collection Time: 02/10/18 11:22 AM  Result Value Ref Range Status   Specimen Description   Final    BLOOD BLOOD LEFT FOREARM Performed at [facility], [address], [place], [place] [address]    Special Requests   Final    BOTTLES DRAWN AEROBIC AND ANAEROBIC Blood Culture results may not be  optimal due to an excessive volume of blood received in culture bottles Performed at [facility], [address] [place], [place] [address]    Culture  Setup Time   Final    GRAM POSITIVE COCCI IN BOTH AEROBIC AND ANAEROBIC BOTTLES CRITICAL VALUE NOTED.  VALUE IS CONSISTENT WITH PREVIOUSLY REPORTED AND CALLED VALUE. Performed at [facility] [place], [address] [place], [place] [address]    Culture (A)  Final    STAPHYLOCOCCUS AUREUS SUSCEPTIBILITIES PERFORMED ON PREVIOUS CULTURE WITHIN THE LAST 5 DAYS. Performed at [facility], [place] [address], [place], [place] [address]    Report Status 02/13/2018 FINAL  Final  Blood culture (routine x 2)     Status: Abnormal   Collection Time: 02/10/18 11:22 AM  Result Value Ref Range Status   Specimen Description   Final    BLOOD RIGHT ANTECUBITAL Performed at [facility], [address] [place], [place] [address]    Special Requests   Final    BOTTLES DRAWN AEROBIC AND ANAEROBIC Blood Culture results may not be optimal due to an inadequate volume of blood received in culture bottles Performed at [facility], [address] [place], [place] [address]    Culture  Setup Time   Final    GRAM POSITIVE COCCI IN BOTH AEROBIC AND ANAEROBIC BOTTLES CRITICAL RESULT CALLED TO, READ BACK BY AND VERIFIED WITH: DAVID BESANTI ON 02/11/18 AT 0117 JAG Performed at [facility], [place] [address], [place], [place] [address]    Culture STAPHYLOCOCCUS AUREUS (A)  Final   Report Status 02/13/2018 FINAL  Final   Organism ID, Bacteria STAPHYLOCOCCUS AUREUS  Final      Susceptibility    Staphylococcus aureus - MIC*    CIPROFLOXACIN <=0.5 SENSITIVE Sensitive     ERYTHROMYCIN <=0.25 SENSITIVE Sensitive     GENTAMICIN <=0.5 SENSITIVE Sensitive     OXACILLIN <=0.25 SENSITIVE Sensitive     TETRACYCLINE <=1 SENSITIVE Sensitive     VANCOMYCIN 1 SENSITIVE Sensitive     TRIMETH/SULFA <=10 SENSITIVE Sensitive     CLINDAMYCIN <=0.25 SENSITIVE Sensitive     RIFAMPIN <=0.5 SENSITIVE Sensitive     Inducible Clindamycin NEGATIVE Sensitive     * STAPHYLOCOCCUS AUREUS  Blood Culture ID Panel (Reflexed)     Status: Abnormal   Collection Time: 02/10/18 11:22 AM  Result Value Ref Range Status   Enterococcus species NOT DETECTED NOT DETECTED Final   Listeria monocytogenes NOT DETECTED NOT DETECTED Final   Staphylococcus species DETECTED (A) NOT DETECTED Final    Comment: CRITICAL RESULT CALLED TO, READ BACK BY AND VERIFIED WITH: DAVID BESANTI ON 02/11/18 AT 0117 JAG    Staphylococcus aureus DETECTED (A) NOT DETECTED Final    Comment: Methicillin (oxacillin) susceptible Staphylococcus aureus (MSSA). Preferred therapy is anti staphylococcal beta lactam antibiotic (Cefazolin or Nafcillin), unless clinically contraindicated. CRITICAL RESULT CALLED TO, READ BACK BY AND VERIFIED WITH: DAVID BESANTI ON 02/11/18 AT 0117  JAG    Methicillin resistance NOT DETECTED NOT DETECTED Final   Streptococcus species NOT DETECTED NOT DETECTED Final   Streptococcus agalactiae NOT DETECTED NOT DETECTED Final   Streptococcus pneumoniae NOT DETECTED NOT DETECTED Final   Streptococcus pyogenes NOT DETECTED NOT DETECTED Final   Acinetobacter baumannii NOT DETECTED NOT DETECTED Final   Enterobacteriaceae species NOT DETECTED NOT DETECTED Final   Enterobacter cloacae complex NOT DETECTED NOT DETECTED Final   Escherichia coli NOT DETECTED NOT DETECTED Final   Klebsiella oxytoca  NOT DETECTED NOT DETECTED Final   Klebsiella pneumoniae NOT DETECTED NOT DETECTED Final   Proteus species NOT DETECTED NOT DETECTED  Final   Serratia marcescens NOT DETECTED NOT DETECTED Final   Haemophilus influenzae NOT DETECTED NOT DETECTED Final   Neisseria meningitidis NOT DETECTED NOT DETECTED Final   Pseudomonas aeruginosa NOT DETECTED NOT DETECTED Final   Candida albicans NOT DETECTED NOT DETECTED Final   Candida glabrata NOT DETECTED NOT DETECTED Final   Candida krusei NOT DETECTED NOT DETECTED Final   Candida parapsilosis NOT DETECTED NOT DETECTED Final   Candida tropicalis NOT DETECTED NOT DETECTED Final    Comment: Performed at [facility], [address] [place], [place] [address]  Culture, blood (single) w Reflex to ID Panel     Status: None (Preliminary result)   Collection Time: 02/11/18 10:21 AM  Result Value Ref Range Status   Specimen Description BLOOD LEFT ANTECUBITAL  Final   Special Requests   Final    BOTTLES DRAWN AEROBIC AND ANAEROBIC Blood Culture adequate volume   Culture   Final    NO GROWTH 2 DAYS Performed at [facility], [address] [place], [place] [address]    Report Status PENDING  Incomplete  Aerobic/Anaerobic Culture (surgical/deep wound)     Status: None (Preliminary result)   Collection Time: 02/11/18  7:55 PM  Result Value Ref Range Status   Specimen Description   Final    HAND RIGHT HAND ABCESS Performed at [facility], [address], [place], [place] [address]    Special Requests   Final    NONE Performed at [facility], [place], [place] [address]    Gram Stain   Final    RARE WBC PRESENT, PREDOMINANTLY PMN FEW GRAM POSITIVE COCCI IN PAIRS Performed at [facility], [address], [place], [place] [address]    Culture ABUNDANT STAPHYLOCOCCUS AUREUS  Final   Report Status PENDING  Incomplete  CULTURE, BLOOD (ROUTINE X 2) w Reflex to ID Panel     Status: None (Preliminary result)   Collection Time: 02/12/18 11:29 AM  Result Value Ref Range Status   Specimen Description BLOOD BLOOD LEFT HAND   Final   Special Requests   Final    BOTTLES DRAWN AEROBIC AND ANAEROBIC Blood Culture adequate volume   Culture   Final    NO GROWTH < 24 HOURS Performed at [facility], [address], [place], [place] [address]    Report Status PENDING  Incomplete  CULTURE, BLOOD (ROUTINE X 2) w Reflex to ID Panel     Status: None (Preliminary result)   Collection Time: 02/12/18 11:36 AM  Result Value Ref Range Status   Specimen Description BLOOD BLOOD LEFT FOREARM  Final   Special Requests   Final    BOTTLES DRAWN AEROBIC AND ANAEROBIC Blood Culture adequate volume   Culture   Final    NO GROWTH < 24 HOURS Performed at [facility], [address], [place], [place] [address]    Report Status PENDING  Incomplete    RADIOLOGY:  [place] Ekg Site Rite  Result Date: 02/13/2018 If Site Rite image not attached, placement could not be confirmed due to current cardiac rhythm.  [place] Ekg Site Rite  Result Date: 02/13/2018 If Site Rite image not attached, placement could not be confirmed due to current cardiac rhythm.    Management plans discussed with the patient, family and they are in agreement.  CODE STATUS:     Code Status Orders  (From admission, onward)        Start     Ordered   02/10/18 1414  Full code  Continuous     02/10/18 1413    Code Status History    Date Active Date Inactive Code Status Order ID Comments User Context   07/31/2016 1649 08/01/2016 1454 Full Code [id number]  Robert Bellow, MD Inpatient   07/31/2016 0918 07/31/2016 1649 Full Code [id number]  Robert Bellow, MD Inpatient    Advance Directive Documentation     Most Recent Value  Type of Advance Directive  Healthcare Power of [place], Living will  Pre-existing out of facility DNR order (yellow form or pink MOST form)  -  "MOST" Form in Place?  -      TOTAL TIME TAKING CARE OF THIS PATIENT: *40* minutes.    Fritzi Mandes M.D on 02/13/2018 at 4:25 PM  Between 7am to 6pm - Pager -  [phone] After 6pm go to [email or web address] - password [email or web address] [facility]  Office  [phone]  CC: Primary care physician; [name], MD

## 2018-02-13 NOTE — Progress Notes (Signed)
OT Cancellation Note  Patient Details Name: Julie Holland MRN: [id number] DOB: Jul 01, 1941   Cancelled Treatment:     Attempted to see pt for OT treatment this date, pt getting PICC line inserted at this time. Will continue to follow as available and appropriate.   Zenovia Jarred, MSOT, OTR/L  [place] 02/13/2018, 1:18 PM

## 2018-02-13 NOTE — Progress Notes (Addendum)
Pt had copious serosangious/bloody drainage after a.m. drain removal and dsg change.  Dr. Posey Pronto with ortho in with op site assessment with new dsg applied- dsg now dry/intact. PICC line placed with p.m. Dose of Ancef received. [place] [facility] services with IV antibiotic long term therapy to start in the morning. Pain controlled with percocet. Husband active in pt. Care. Oral and written AVS instructions given to pt and husband with rx for ancef IV given. Pt to be discharged home with [facility] services.

## 2018-02-13 NOTE — Progress Notes (Signed)
Physical Therapy Treatment Patient Details Name: Julie Holland MRN: [id number] DOB: 1941-05-15 Today's Date: 02/13/2018    History of Present Illness 77 y.o.femalewith a known history of arthritis, perianal carcinoma in situ, CKD stage I, diabetes mellitus type 2, GERD, glaucoma, hemorrhoids, hyperlipidemia, HTN presented to the ED with pain and swelling in the right wrist.Patient had a fall at home secondary to loss of balance 1 week ago last Thursday.She fell on her outstretched right hand; imaging studies of the right upper extremity no fractures.Patient hasd redness and swelling around the right wrist and was tender to touch. Found to have abscess. POD#1 incision and drainage.     PT Comments    Pt in bed, ready for session.  Anxious to return home today.  Pt inc urine and care given as appropriate.  To edge of bed with min guard.  Increased time needed but no physical assist.  Stood with min assist with verbal cues for hand placements.  Once standing she is able to ambulate 100' with R platform walker and min guard.  No LOB noted.  Remained in recliner after session.    Discussed with pt and husband regarding hand placements and safety with mobility in the home.  She has 3 stairs to enter with left rail and has no concerns regarding her ability to do so.  Husband and son will he there to assist her.  No further questions or concerns for therapy.    Follow Up Recommendations  Home health PT;Supervision/Assistance - 24 hour     Equipment Recommendations    R platform attachment needed for personal walker.   Recommendations for Other Services       Precautions / Restrictions Precautions Precautions: Fall Restrictions Weight Bearing Restrictions: No    Mobility  Bed Mobility Overal bed mobility: Needs Assistance Bed Mobility: Supine to Sit     Supine to sit: Min guard        Transfers Overall transfer level: Needs assistance Equipment used: Right platform  walker Transfers: Sit to/from Stand Sit to Stand: Min assist            Ambulation/Gait Ambulation/Gait assistance: Min assist Ambulation Distance (Feet): 100 Feet Assistive device: Right platform walker           Stairs             Wheelchair Mobility    Modified Rankin (Stroke Patients Only)       Balance Overall balance assessment: Needs assistance Sitting-balance support: Single extremity supported;Feet supported Sitting balance-Leahy Scale: Good Sitting balance - Comments: B LE supported on floor sits unsupported EOB   Standing balance support: Single extremity supported   Standing balance comment: B UE support on RW using R platform, contact guard to min assist while standing.                            Cognition Arousal/Alertness: Awake/alert Behavior During Therapy: WFL for tasks assessed/performed Overall Cognitive Status: Within Functional Limits for tasks assessed                                        Exercises Other Exercises Other Exercises: safety education with husband for general mobility and stairs    General Comments        Pertinent Vitals/Pain Pain Assessment: 0-10 Pain Score: 2  Pain Location: R hand Pain  Descriptors / Indicators: Aching Pain Intervention(s): Limited activity within patient's tolerance;Monitored during session    Home Living                      Prior Function            PT Goals (current goals can now be found in the care plan section) Progress towards PT goals: Progressing toward goals    Frequency    Min 2X/week      PT Plan Current plan remains appropriate    Co-evaluation              AM-PAC PT "6 Clicks" Daily Activity  Outcome Measure  Difficulty turning over in bed (including adjusting bedclothes, sheets and blankets)?: A Little Difficulty moving from lying on back to sitting on the side of the bed? : A Little Difficulty sitting down on  and standing up from a chair with arms (e.g., wheelchair, bedside commode, etc,.)?: Unable Help needed moving to and from a bed to chair (including a wheelchair)?: A Little Help needed walking in hospital room?: A Little Help needed climbing 3-5 steps with a railing? : A Little 6 Click Score: 16    End of Session Equipment Utilized During Treatment: Gait belt Activity Tolerance: Patient tolerated treatment well Patient left: in chair;with call bell/phone within reach;with family/visitor present;with chair alarm set;Other (comment)   Pain - Right/Left: Right Pain - part of body: Hand     Time: 2130-8657 PT Time Calculation (min) (ACUTE ONLY): 32 min  Charges:  $Gait Training: 8-22 mins $Therapeutic Activity: 8-22 mins                    G Codes:       Chesley Noon, PTA 02/13/18, 10:01 AM

## 2018-02-13 NOTE — Progress Notes (Signed)
Peripherally Inserted Central Catheter/Midline Placement  The IV Nurse has discussed with the patient and/or persons authorized to consent for the patient, the purpose of this procedure and the potential benefits and risks involved with this procedure.  The benefits include less needle sticks, lab draws from the catheter, and the patient may be discharged home with the catheter. Risks include, but not limited to, infection, bleeding, blood clot (thrombus formation), and puncture of an artery; nerve damage and irregular heartbeat and possibility to perform a PICC exchange if needed/ordered by physician.  Alternatives to this procedure were also discussed.  Bard Power PICC patient education guide, fact sheet on infection prevention and patient information card has been provided to patient /or left at bedside.    PICC/Midline Placement Documentation  PICC Single Lumen 27/06/23 PICC Left Basilic 42 cm 0 cm (Active)  Indication for Insertion or Continuance of Line Home intravenous therapies (PICC only) 02/13/2018  1:00 PM  Exposed Catheter (cm) 0 cm 02/13/2018  1:00 PM  Site Assessment Clean;Dry;Intact 02/13/2018  1:00 PM  Line Status Flushed;Blood return noted;Saline locked 02/13/2018  1:00 PM  Dressing Type Transparent 02/13/2018  1:00 PM  Dressing Status Clean;Dry;Intact;Antimicrobial disc in place 02/13/2018  1:00 PM  Line Care Connections checked and tightened 02/13/2018  1:00 PM  Dressing Intervention New dressing 02/13/2018  1:00 PM  Dressing Change Due 02/20/18 02/13/2018  1:00 PM       Lorenza Cambridge 02/13/2018, 1:46 PM
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
## 2018-02-13 NOTE — Discharge Instructions (Signed)
The client care per protocol. Dressing change per instruction

## 2018-02-13 NOTE — Progress Notes (Signed)
Patient ID: Julie Holland, female   DOB: 03/05/1941, 77 y.o.   MRN: [id number]  I was called and informed theresa IV PICC team RN that she will not be placing the PICC line for patient since we need to wait till Monday for blood cultures to be negative for 48 hours. She told me she is discussed with Dr. Johnnye Sima ID at [facility]. I called lab and confirmed that patient has negative blood cultures for more than 48 hours that were drawn on 6/6 /2019 cultures from 6/7/ 2019 are negative as well spoke with Dr. Johnnye Sima and he is okay with PICC line be placed if cultures are more than 48 hours negative it was very difficult for me to get in touch with Helene Kelp since I was told she does not carry a pager and does not have a work phone to get in touch with her. Spoke with another PICC member Jenny Reichmann who will try to get in touch with her and get the picc line placed today.  Called [phone] and [phone]---unable to reach on either phones!!!

## 2018-02-13 NOTE — Progress Notes (Signed)
Discharge home with [facility] accompanied by her husband. Transported to private vehicle in transport chair.

## 2018-02-13 NOTE — Progress Notes (Signed)
Dr. Posey Pronto adamant PICC placed today, after being consulted by Infectious Disease to hold off PICC until Monday. Jake Samples, RN spoke with Dr. Posey Pronto concerning the same issue, as the PICC team waits for blood cultures to be negative for 48 hours. At the time of this note, the latest results of blood cultures is no growth for less than 24 hours. Dr. Posey Pronto contacted Dr. Johnnye Sima from [place]. He agreed to move forward and place PICC today. Dr. Posey Pronto to reenter order for PICC placement.

## 2018-02-13 NOTE — Progress Notes (Signed)
Spoke with Dr Johnnye Sima with Infectious Disease.  New order to place PICC on Monday if blood cultures negative.  Dr Posey Pronto notified. Please reorder PICC once BC negative.

## 2018-02-13 NOTE — Progress Notes (Signed)
  Subjective: 2 Days Post-Op Procedure(s) (LRB): INCISION AND DRAINAGE- RIGHT HAND (Right) Patient reports pain as mild.   Patient seen in rounds with Dr. Posey Pronto. Patient is well, and has had no acute complaints or problems Plan is to go Home after hospital stay. Negative for chest pain and shortness of breath Fever: no Gastrointestinal:negative for nausea and vomiting Receiving PICC line today for IV Abx at home  Objective: Vital signs in last 24 hours: Temp:  [97.7 F (36.5 C)-98.6 F (37 C)] 98.4 F (36.9 C) (06/08 3557) Pulse Rate:  [73-89] 89 (06/08 0632) Resp:  [15-20] 16 (06/08 0632) BP: (115-144)/(61-77) 144/77 (06/08 0632) SpO2:  [96 %-99 %] 99 % (06/08 0632)  Intake/Output from previous day:  Intake/Output Summary (Last 24 hours) at 02/13/2018 0730 Last data filed at 02/12/2018 1800 Gross per 24 hour  Intake 1348.33 ml  Output -  Net 1348.33 ml    Intake/Output this shift: No intake/output data recorded.  Labs: Recent Labs    02/10/18 1033 02/11/18 0646 02/12/18 0506 02/13/18 0500  HGB 9.4* 9.0* 9.0* 7.6*   Recent Labs    02/12/18 0506 02/13/18 0500  WBC 17.1* 11.7*  RBC 3.11* 2.63*  HCT 27.6* 23.0*  PLT 73* 79*   Recent Labs    02/11/18 0646 02/12/18 0506  NA 127* 132*  K 3.4* 3.9  CL 97* 100*  CO2 22 20*  BUN 18 21*  CREATININE 0.60 0.60  GLUCOSE 152* 169*  CALCIUM 8.0* 8.2*   Recent Labs    02/12/18 0506  INR 1.16     EXAM General - Patient is Alert and Oriented Extremity - Right upper extremity with significant clotting hemorrhage from surgery.  Diffuse dressing was removed with Penrose drains removed.  New bulky bandage was applied.  No purulence noted.  Able to move fingers gently. Dressing/Incision - copious drainage on dressing with clotting. Motor Function - intact, moving fingers gently well on exam.   Past Medical History:  Diagnosis Date  . Arthritis   . Asthma   . 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  . Cataract   . Chronic kidney disease    stage 1  . Diabetes mellitus    type II  . Dry eye of right side   . GERD (gastroesophageal reflux disease)    OCC  . Glaucoma 2018   RIGHT EYE   . Hemorrhoids   . Hyperlipidemia   . Hypertension     Assessment/Plan: 2 Days Post-Op Procedure(s) (LRB): INCISION AND DRAINAGE- RIGHT HAND (Right) Active Problems:   Cellulitis  Estimated body mass index is 19.64 kg/m as calculated from the following:   Height as of this encounter: 5\' 5"  (1.651 m).   Weight as of this encounter: 53.5 kg (118 lb). Advance diet  PICC line inserted today. IV antibiotics for home.  Possible discharge home today.  Follow-up [facility] orthopedics early next week.  DVT Prophylaxis - Aspirin   Reche Dixon, PA-C Orthopaedic Surgery 02/13/2018, 7:30 AM

## 2018-02-13 NOTE — Care Management Note (Signed)
Case Management Note  Patient Details  Name: Julie Holland MRN: [id number] Date of Birth: 12-11-1940  Subjective/Objective:         Patient to be discharging with [place] due to IV abx administration needs. RNCM spoke with pharmacy and MD to manage scheduling of antibiotic. Afternoon doseage given and per pharmacy and Md this will be last dose today and patient may resume tomorrow via [facility] RN. Patient given choice of agency and patient and spouse selected [place]. Jermaine notified of referral.   Ines Bloomer RN BSN RNCM [phone]           Action/Plan:   Expected Discharge Date:                  Expected Discharge Plan:  [place]  In-House Referral:     Discharge planning Services  CM Consult  Post Acute Care Choice:  Durable Medical Equipment, Home Health Choice offered to:     DME Arranged:  Walker platform, IV pump/equipment DME Agency:  [place] Arranged:  RN, PT [facility] Agency:  [place]  Status of Service:  Completed, signed off  If discussed at [place] of Stay Meetings, dates discussed:    Additional Comments:  Latanya Maudlin, RN 02/13/2018, 3:25 PM

## 2018-02-14 DIAGNOSIS — Z452 Encounter for adjustment and management of vascular access device: Secondary | ICD-10-CM | POA: Diagnosis not present

## 2018-02-14 DIAGNOSIS — M869 Osteomyelitis, unspecified: Secondary | ICD-10-CM | POA: Diagnosis not present

## 2018-02-14 DIAGNOSIS — L03113 Cellulitis of right upper limb: Secondary | ICD-10-CM | POA: Diagnosis not present

## 2018-02-14 DIAGNOSIS — N189 Chronic kidney disease, unspecified: Secondary | ICD-10-CM | POA: Diagnosis not present

## 2018-02-15 ENCOUNTER — Telehealth: Payer: Self-pay

## 2018-02-15 ENCOUNTER — Telehealth: Payer: Self-pay | Admitting: Family Medicine

## 2018-02-15 LAB — GLUCOSE, CAPILLARY: Glucose-Capillary: 144 mg/dL — ABNORMAL HIGH (ref 65–99)

## 2018-02-15 NOTE — Telephone Encounter (Signed)
TOC #1. Called pt to f/u after d/c from [facility] on 02/13/18. At the time of this entry, pt has not yet scheduled a hosp f/u appt with Dr. Sanda Klein. Discharge planning includes the following:  - start Ancef and Percocet - stop naproxen - d/c home with PICC, [facility] and the following DME: walker platform, IV pump and equipment - f/u with Dr. Sanda Klein in 10 days, Dr. Rudene Christians in 4 days and ID in 3 weeks - repeat blood cultures and LFT's - continue renal diet  LVM requesting returned call.

## 2018-02-15 NOTE — Telephone Encounter (Signed)
Yes, that's fine Please make she sure she has hospital follow-up with ME in the next 6 days, okay to book end of morning 11:40 or 12:00  Thank you

## 2018-02-15 NOTE — Telephone Encounter (Signed)
Copied from [place] [id number]. Topic: Quick Communication - See Telephone Encounter >> Feb 15, 2018  2:23 PM Bea Graff, NT wrote: CRM for notification. See Telephone encounter for: 02/15/18. Butch Penny with [place] is needing a okay from Dr. Sanda Klein to go out once or twice a week for IV check. Pt is on cephazoline. As well as blood draws. CB#: [phone].

## 2018-02-16 ENCOUNTER — Telehealth: Payer: Self-pay

## 2018-02-16 DIAGNOSIS — L03113 Cellulitis of right upper limb: Secondary | ICD-10-CM | POA: Diagnosis not present

## 2018-02-16 DIAGNOSIS — Z452 Encounter for adjustment and management of vascular access device: Secondary | ICD-10-CM | POA: Diagnosis not present

## 2018-02-16 DIAGNOSIS — D696 Thrombocytopenia, unspecified: Secondary | ICD-10-CM | POA: Diagnosis not present

## 2018-02-16 DIAGNOSIS — I129 Hypertensive chronic kidney disease with stage 1 through stage 4 chronic kidney disease, or unspecified chronic kidney disease: Secondary | ICD-10-CM | POA: Diagnosis not present

## 2018-02-16 DIAGNOSIS — L02413 Cutaneous abscess of right upper limb: Secondary | ICD-10-CM | POA: Diagnosis not present

## 2018-02-16 DIAGNOSIS — B9561 Methicillin susceptible Staphylococcus aureus infection as the cause of diseases classified elsewhere: Secondary | ICD-10-CM | POA: Diagnosis not present

## 2018-02-16 LAB — CULTURE, BLOOD (SINGLE)
Culture: NO GROWTH
Special Requests: ADEQUATE

## 2018-02-16 NOTE — Telephone Encounter (Signed)
TOC #2. Called pt to f/u after d/c from [facility] on 02/13/18. At the time of this entry, pt has not yet scheduled a hosp f/u appt with Dr. Sanda Klein. Discharge planning includes the following:  - start Ancef and Percocet - stop naproxen - d/c home with PICC, [facility] and the following DME: walker platform, IV pump and equipment - f/u with Dr. Sanda Klein in 10 days, Dr. Rudene Christians in 4 days and ID in 3 weeks - repeat blood cultures and LFT's - continue renal diet  LVM's on pt, spouse and son's VM's requesting returned call.

## 2018-02-16 NOTE — Telephone Encounter (Signed)
Left detailed voicemail that we could see pt this Wednesday at 12

## 2018-02-17 LAB — CULTURE, BLOOD (ROUTINE X 2)
Culture: NO GROWTH
Culture: NO GROWTH
Special Requests: ADEQUATE
Special Requests: ADEQUATE

## 2018-02-17 LAB — AEROBIC/ANAEROBIC CULTURE W GRAM STAIN (SURGICAL/DEEP WOUND)

## 2018-02-17 LAB — AEROBIC/ANAEROBIC CULTURE (SURGICAL/DEEP WOUND)

## 2018-02-18 ENCOUNTER — Encounter: Payer: Self-pay | Admitting: Emergency Medicine

## 2018-02-18 ENCOUNTER — Telehealth: Payer: Self-pay | Admitting: Family Medicine

## 2018-02-18 ENCOUNTER — Inpatient Hospital Stay
Admission: EM | Admit: 2018-02-18 | Discharge: 2018-02-20 | DRG: 378 | Disposition: A | Discharge status: Home Health | Payer: Medicare Other | Attending: Family Medicine | Admitting: Family Medicine

## 2018-02-18 ENCOUNTER — Other Ambulatory Visit: Payer: Self-pay

## 2018-02-18 DIAGNOSIS — K2970 Gastritis, unspecified, without bleeding: Secondary | ICD-10-CM | POA: Diagnosis not present

## 2018-02-18 DIAGNOSIS — Z885 Allergy status to narcotic agent status: Secondary | ICD-10-CM | POA: Diagnosis not present

## 2018-02-18 DIAGNOSIS — K279 Peptic ulcer, site unspecified, unspecified as acute or chronic, without hemorrhage or perforation: Secondary | ICD-10-CM

## 2018-02-18 DIAGNOSIS — D509 Iron deficiency anemia, unspecified: Secondary | ICD-10-CM

## 2018-02-18 DIAGNOSIS — Z7984 Long term (current) use of oral hypoglycemic drugs: Secondary | ICD-10-CM | POA: Diagnosis not present

## 2018-02-18 DIAGNOSIS — K254 Chronic or unspecified gastric ulcer with hemorrhage: Secondary | ICD-10-CM | POA: Diagnosis not present

## 2018-02-18 DIAGNOSIS — Z90710 Acquired absence of both cervix and uterus: Secondary | ICD-10-CM | POA: Diagnosis not present

## 2018-02-18 DIAGNOSIS — D638 Anemia in other chronic diseases classified elsewhere: Secondary | ICD-10-CM | POA: Diagnosis present

## 2018-02-18 DIAGNOSIS — L03818 Cellulitis of other sites: Secondary | ICD-10-CM | POA: Diagnosis present

## 2018-02-18 DIAGNOSIS — B9561 Methicillin susceptible Staphylococcus aureus infection as the cause of diseases classified elsewhere: Secondary | ICD-10-CM | POA: Diagnosis present

## 2018-02-18 DIAGNOSIS — L02511 Cutaneous abscess of right hand: Secondary | ICD-10-CM | POA: Diagnosis present

## 2018-02-18 DIAGNOSIS — I129 Hypertensive chronic kidney disease with stage 1 through stage 4 chronic kidney disease, or unspecified chronic kidney disease: Secondary | ICD-10-CM | POA: Diagnosis present

## 2018-02-18 DIAGNOSIS — E1169 Type 2 diabetes mellitus with other specified complication: Secondary | ICD-10-CM | POA: Diagnosis not present

## 2018-02-18 DIAGNOSIS — K922 Gastrointestinal hemorrhage, unspecified: Secondary | ICD-10-CM | POA: Diagnosis present

## 2018-02-18 DIAGNOSIS — N181 Chronic kidney disease, stage 1: Secondary | ICD-10-CM | POA: Diagnosis not present

## 2018-02-18 DIAGNOSIS — K259 Gastric ulcer, unspecified as acute or chronic, without hemorrhage or perforation: Secondary | ICD-10-CM | POA: Diagnosis not present

## 2018-02-18 DIAGNOSIS — E871 Hypo-osmolality and hyponatremia: Secondary | ICD-10-CM | POA: Diagnosis not present

## 2018-02-18 DIAGNOSIS — D62 Acute posthemorrhagic anemia: Secondary | ICD-10-CM | POA: Diagnosis present

## 2018-02-18 DIAGNOSIS — E119 Type 2 diabetes mellitus without complications: Secondary | ICD-10-CM | POA: Diagnosis not present

## 2018-02-18 DIAGNOSIS — I10 Essential (primary) hypertension: Secondary | ICD-10-CM | POA: Diagnosis not present

## 2018-02-18 DIAGNOSIS — Z9841 Cataract extraction status, right eye: Secondary | ICD-10-CM | POA: Diagnosis not present

## 2018-02-18 DIAGNOSIS — D649 Anemia, unspecified: Secondary | ICD-10-CM | POA: Diagnosis not present

## 2018-02-18 DIAGNOSIS — Z7982 Long term (current) use of aspirin: Secondary | ICD-10-CM | POA: Diagnosis not present

## 2018-02-18 DIAGNOSIS — E878 Other disorders of electrolyte and fluid balance, not elsewhere classified: Secondary | ICD-10-CM | POA: Diagnosis present

## 2018-02-18 DIAGNOSIS — Z9049 Acquired absence of other specified parts of digestive tract: Secondary | ICD-10-CM | POA: Diagnosis not present

## 2018-02-18 DIAGNOSIS — E1122 Type 2 diabetes mellitus with diabetic chronic kidney disease: Secondary | ICD-10-CM | POA: Diagnosis present

## 2018-02-18 DIAGNOSIS — M8618 Other acute osteomyelitis, other site: Secondary | ICD-10-CM | POA: Diagnosis not present

## 2018-02-18 DIAGNOSIS — K219 Gastro-esophageal reflux disease without esophagitis: Secondary | ICD-10-CM | POA: Diagnosis present

## 2018-02-18 DIAGNOSIS — M869 Osteomyelitis, unspecified: Secondary | ICD-10-CM | POA: Diagnosis not present

## 2018-02-18 DIAGNOSIS — E785 Hyperlipidemia, unspecified: Secondary | ICD-10-CM | POA: Diagnosis present

## 2018-02-18 DIAGNOSIS — Z79899 Other long term (current) drug therapy: Secondary | ICD-10-CM | POA: Diagnosis not present

## 2018-02-18 DIAGNOSIS — Z9842 Cataract extraction status, left eye: Secondary | ICD-10-CM | POA: Diagnosis not present

## 2018-02-18 LAB — CBC WITH DIFFERENTIAL/PLATELET
Band Neutrophils: 5 %
Basophils Absolute: 0.4 10*3/uL — ABNORMAL HIGH (ref 0–0.1)
Basophils Relative: 2 %
Blasts: 0 %
Eosinophils Absolute: 1 10*3/uL — ABNORMAL HIGH (ref 0–0.7)
Eosinophils Relative: 5 %
HCT: 16.1 % — ABNORMAL LOW (ref 35.0–47.0)
Hemoglobin: 5.2 g/dL — ABNORMAL LOW (ref 12.0–16.0)
Lymphocytes Relative: 10 %
Lymphs Abs: 1.9 10*3/uL (ref 1.0–3.6)
MCH: 28.9 pg (ref 26.0–34.0)
MCHC: 32.6 g/dL (ref 32.0–36.0)
MCV: 88.5 fL (ref 80.0–100.0)
Metamyelocytes Relative: 5 %
Monocytes Absolute: 2.1 10*3/uL — ABNORMAL HIGH (ref 0.2–0.9)
Monocytes Relative: 11 %
Myelocytes: 3 %
Neutro Abs: 13.5 10*3/uL — ABNORMAL HIGH (ref 1.4–6.5)
Neutrophils Relative %: 57 %
Other: 2 %
Platelets: 221 10*3/uL (ref 150–440)
Promyelocytes Relative: 0 %
RBC: 1.82 MIL/uL — ABNORMAL LOW (ref 3.80–5.20)
RDW: 17 % — ABNORMAL HIGH (ref 11.5–14.5)
WBC: 19.3 10*3/uL — ABNORMAL HIGH (ref 3.6–11.0)
nRBC: 1 /100 WBC — ABNORMAL HIGH

## 2018-02-18 LAB — COMPREHENSIVE METABOLIC PANEL
ALT: 12 U/L — ABNORMAL LOW (ref 14–54)
AST: 29 U/L (ref 15–41)
Albumin: 2.6 g/dL — ABNORMAL LOW (ref 3.5–5.0)
Alkaline Phosphatase: 90 U/L (ref 38–126)
Anion gap: 10 (ref 5–15)
BUN: 21 mg/dL — ABNORMAL HIGH (ref 6–20)
CO2: 27 mmol/L (ref 22–32)
Calcium: 8.3 mg/dL — ABNORMAL LOW (ref 8.9–10.3)
Chloride: 95 mmol/L — ABNORMAL LOW (ref 101–111)
Creatinine, Ser: 0.56 mg/dL (ref 0.44–1.00)
GFR calc Af Amer: >60 mL/min (ref >60)
GFR calc non Af Amer: >60 mL/min (ref >60)
Glucose, Bld: 160 mg/dL — ABNORMAL HIGH (ref 65–99)
Potassium: 3.8 mmol/L (ref 3.5–5.1)
Sodium: 132 mmol/L — ABNORMAL LOW (ref 135–145)
Total Bilirubin: 0.4 mg/dL (ref 0.3–1.2)
Total Protein: 6.8 g/dL (ref 6.5–8.1)

## 2018-02-18 LAB — GLUCOSE, CAPILLARY
Glucose-Capillary: 114 mg/dL — ABNORMAL HIGH (ref 65–99)
Glucose-Capillary: 126 mg/dL — ABNORMAL HIGH (ref 65–99)
Glucose-Capillary: 132 mg/dL — ABNORMAL HIGH (ref 65–99)
Glucose-Capillary: 154 mg/dL — ABNORMAL HIGH (ref 65–99)

## 2018-02-18 LAB — FOLATE: Folate: 23 ng/mL (ref >5.9)

## 2018-02-18 LAB — HEMOGLOBIN AND HEMATOCRIT, BLOOD
HCT: 15.6 % — ABNORMAL LOW (ref 35.0–47.0)
Hemoglobin: 5 g/dL — ABNORMAL LOW (ref 12.0–16.0)

## 2018-02-18 LAB — FERRITIN: Ferritin: 155 ng/mL (ref 11–307)

## 2018-02-18 LAB — IRON AND TIBC
Iron: 24 ug/dL — ABNORMAL LOW (ref 28–170)
Saturation Ratios: 9 % — ABNORMAL LOW (ref 10.4–31.8)
TIBC: 263 ug/dL (ref 250–450)
UIBC: 239 ug/dL

## 2018-02-18 LAB — PREPARE RBC (CROSSMATCH)

## 2018-02-18 LAB — LACTATE DEHYDROGENASE: LDH: 264 U/L — ABNORMAL HIGH (ref 98–192)

## 2018-02-18 LAB — VITAMIN B12: Vitamin B-12: 746 pg/mL (ref 180–914)

## 2018-02-18 MED ORDER — CARVEDILOL 6.25 MG PO TABS
6.2500 mg | ORAL_TABLET | Freq: Two times a day (BID) | ORAL | Status: DC
  Administered 2018-02-18 – 2018-02-20 (×3): 6.25 mg via ORAL
  Filled 2018-02-18 (×3): qty 1

## 2018-02-18 MED ORDER — LATANOPROST 0.005 % OP SOLN
1.0000 [drp] | Freq: Every day | OPHTHALMIC | Status: DC
  Administered 2018-02-18 – 2018-02-19 (×2): 1 [drp] via OPHTHALMIC
  Filled 2018-02-18: qty 2.5

## 2018-02-18 MED ORDER — PANTOPRAZOLE SODIUM 40 MG IV SOLR: 40.0000 mg | Freq: Two times a day (BID) | INTRAVENOUS | Status: DC

## 2018-02-18 MED ORDER — ONDANSETRON HCL 4 MG PO TABS: 4.0000 mg | ORAL_TABLET | Freq: Four times a day (QID) | ORAL | Status: DC | PRN

## 2018-02-18 MED ORDER — BUTALBITAL-APAP-CAFFEINE 50-325-40 MG PO TABS
1.0000 | ORAL_TABLET | Freq: Four times a day (QID) | ORAL | Status: DC | PRN
  Filled 2018-02-18: qty 2

## 2018-02-18 MED ORDER — CEFAZOLIN SODIUM-DEXTROSE 2-4 GM/100ML-% IV SOLN
2.0000 g | Freq: Three times a day (TID) | INTRAVENOUS | Status: DC
  Administered 2018-02-18 – 2018-02-20 (×5): 2 g via INTRAVENOUS
  Filled 2018-02-18 (×8): qty 100

## 2018-02-18 MED ORDER — MOMETASONE FURO-FORMOTEROL FUM 100-5 MCG/ACT IN AERO
2.0000 | INHALATION_SPRAY | Freq: Two times a day (BID) | RESPIRATORY_TRACT | Status: DC
  Administered 2018-02-18 – 2018-02-20 (×4): 2 via RESPIRATORY_TRACT
  Filled 2018-02-18: qty 8.8

## 2018-02-18 MED ORDER — OXYCODONE-ACETAMINOPHEN 5-325 MG PO TABS
1.0000 | ORAL_TABLET | Freq: Four times a day (QID) | ORAL | Status: DC | PRN
  Administered 2018-02-18 – 2018-02-19 (×3): 1 via ORAL
  Filled 2018-02-18 (×3): qty 1

## 2018-02-18 MED ORDER — ACETAMINOPHEN 500 MG PO TABS: 500.0000 mg | ORAL_TABLET | Freq: Four times a day (QID) | ORAL | Status: DC | PRN

## 2018-02-18 MED ORDER — SODIUM CHLORIDE 0.9 % IV SOLN
8.0000 mg/h | INTRAVENOUS | Status: DC
  Administered 2018-02-18 – 2018-02-19 (×2): 8 mg/h via INTRAVENOUS
  Filled 2018-02-18 (×3): qty 80

## 2018-02-18 MED ORDER — INSULIN ASPART 100 UNIT/ML ~~LOC~~ SOLN
0.0000 [IU] | Freq: Three times a day (TID) | SUBCUTANEOUS | Status: DC
  Administered 2018-02-18 – 2018-02-20 (×3): 1 [IU] via SUBCUTANEOUS
  Filled 2018-02-18 (×3): qty 1

## 2018-02-18 MED ORDER — MORPHINE SULFATE (PF) 2 MG/ML IV SOLN
2.0000 mg | INTRAVENOUS | Status: DC | PRN
  Administered 2018-02-19: 2 mg via INTRAVENOUS
  Filled 2018-02-18: qty 1

## 2018-02-18 MED ORDER — POLYVINYL ALCOHOL 1.4 % OP SOLN
1.0000 [drp] | Freq: Three times a day (TID) | OPHTHALMIC | Status: DC | PRN
  Filled 2018-02-18 (×3): qty 15

## 2018-02-18 MED ORDER — SIMVASTATIN 10 MG PO TABS
20.0000 mg | ORAL_TABLET | Freq: Every day | ORAL | Status: DC
  Administered 2018-02-18 – 2018-02-19 (×2): 20 mg via ORAL
  Filled 2018-02-18 (×3): qty 2

## 2018-02-18 MED ORDER — INSULIN ASPART 100 UNIT/ML ~~LOC~~ SOLN: 0.0000 [IU] | Freq: Every day | SUBCUTANEOUS | Status: DC

## 2018-02-18 MED ORDER — CEFAZOLIN IV (FOR PTA / DISCHARGE USE ONLY): 2.0000 g | Freq: Three times a day (TID) | INTRAVENOUS | Status: DC

## 2018-02-18 MED ORDER — DOCUSATE SODIUM 100 MG PO CAPS
100.0000 mg | ORAL_CAPSULE | Freq: Two times a day (BID) | ORAL | Status: DC
  Administered 2018-02-19 – 2018-02-20 (×3): 100 mg via ORAL
  Filled 2018-02-18 (×4): qty 1

## 2018-02-18 MED ORDER — SODIUM CHLORIDE 0.9 % IV SOLN
Freq: Once | INTRAVENOUS | Status: AC
  Administered 2018-02-18: 19:00:00 via INTRAVENOUS

## 2018-02-18 MED ORDER — ONDANSETRON HCL 4 MG/2ML IJ SOLN: 4.0000 mg | Freq: Four times a day (QID) | INTRAMUSCULAR | Status: DC | PRN

## 2018-02-18 MED ORDER — ALBUTEROL SULFATE (2.5 MG/3ML) 0.083% IN NEBU: 2.5000 mg | INHALATION_SOLUTION | Freq: Four times a day (QID) | RESPIRATORY_TRACT | Status: DC | PRN

## 2018-02-18 MED ORDER — SODIUM CHLORIDE 0.9 % IV SOLN
80.0000 mg | Freq: Once | INTRAVENOUS | Status: AC
  Administered 2018-02-18: 80 mg via INTRAVENOUS
  Filled 2018-02-18: qty 80

## 2018-02-18 NOTE — ED Triage Notes (Signed)
Sent by [facility] dr lada for hgb 5.

## 2018-02-18 NOTE — Telephone Encounter (Signed)
Received critical lab result from [facility] from [place] [phone]). Pt's Hgb 5.0, HCT 16.6, RBC 1.76. Called PCP office to report. Spoke with Dr.Lada and gave her the results. Called Misty back to find out ordering physician. Pt is still under care of her orthopedicac surgeon. Was given the name of Dr Ollen Bowl and called office but was closed for lunch. Called pt back and spoke with husband who will take her to the ED at [facility]. Per pt's husband request called and spoke with Amber to let them know pt is on her way and results of labs were also given.

## 2018-02-18 NOTE — H&P (Addendum)
[facility] [place] NAME: Julie Holland    MR#:  [id number]  DATE OF BIRTH:  June 05, 1941  DATE OF ADMISSION:  02/18/2018  PRIMARY CARE PHYSICIAN: [name], MD   REQUESTING/REFERRING PHYSICIAN: [place]  CHIEF COMPLAINT:  Abnormal labs with anemia hemoglobin 5.5  HISTORY OF PRESENT ILLNESS:  Julie Holland  is a 77 y.o. female with a known history of chronic kidney disease stage I, diabetes mellitus, hypertension, hyperlipidemia was recently admitted to the hospital with right hand abscess drained by Dr. Rudene Christians and eventually had osteomyelitis discharged home with IV Ancef 2 g every 8 hours her routine lab work has revealed hemoglobin 5.5.  And patient is sent over to the emergency department for abnormal labs.  Stool for occult blood is positive in the ED.  Patient is a symptomatic other than tiredness and fatigue.  Denies any chest pain or shortness of breath.  Son at bedside.  2 units of blood transfusion was ordered in the emergency department by the emergency department physician  PAST MEDICAL HISTORY:   Past Medical History:  Diagnosis Date  . Arthritis   . Asthma   . Cancer ([place]) 03/2015   In situ carcinoma of the perianal skin, incidental finding at hemorrhoidectomy.  . Cataract   . Chronic kidney disease    stage 1  . Diabetes mellitus    type II  . Dry eye of right side   . GERD (gastroesophageal reflux disease)    OCC  . Glaucoma 2018   RIGHT EYE   . Hemorrhoids   . Hyperlipidemia   . Hypertension     PAST SURGICAL HISTOIRY:   Past Surgical History:  Procedure Laterality Date  . CATARACT EXTRACTION, BILATERAL    . CHOLECYSTECTOMY    . COLONOSCOPY  02/13/13   Dr Bary Castilla  . EPIGASTRIC HERNIA REPAIR N/A 03/20/2015   Procedure: HERNIA REPAIR EPIGASTRIC ADULT;  Surgeon: Robert Bellow, MD;  Location: [facility] ORS;  Service: General;  Laterality: N/A;  . HEMORRHOID SURGERY N/A 03/20/2015    FOCAL HIGH-GRADE  SQUAMOUS INTRAEPITHELIAL LESION (HSIL, ANAL /HEMORRHOIDECTOMY;   Robert Bellow, MD [facility] ORS;  : General;  Laterality: N/A;  . HERNIA REPAIR  July 2016   Epigastric hernia, primary repair  . INCISION AND DRAINAGE Right 02/11/2018   Procedure: INCISION AND DRAINAGE- RIGHT HAND;  Surgeon: Hessie Knows, MD;  Location: [facility] ORS;  Service: Orthopedics;  Laterality: Right;  . TONSILLECTOMY  age 39  . TOTAL ABDOMINAL HYSTERECTOMY  01/1989  . TUBAL LIGATION    . 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  Case has been presented at the [facility] tumor board. No indication for additional treatment outside of serial exams  Surgeon: Robert Bellow, MD;  Location: [facility] ORS;  Service: General;  Laterality: N/A;    SOCIAL HISTORY:   Social History   Tobacco Use  . Smoking status: Never Smoker  . Smokeless tobacco: Never Used  . Tobacco comment: smoking cessation materials not required  Substance Use Topics  . Alcohol use: No    FAMILY HISTORY:   Family History  Problem Relation Age of Onset  . Bladder Cancer Mother   . Colon cancer Father   . Lung cancer Brother   . Lymphoma Sister   . Breast cancer Neg Hx     DRUG ALLERGIES:   Allergies  Allergen Reactions  .  Hydrocodone Nausea And Vomiting and Other (See Comments)    Vertigo but patient takes hydrocodone/acetaminophen outpatient    REVIEW OF SYSTEMS:  CONSTITUTIONAL: fever but reporting fatigue EYES: No blurred or double vision.  EARS, NOSE, AND THROAT: No tinnitus or ear pain.  RESPIRATORY: No cough, shortness of breath, wheezing or hemoptysis.  CARDIOVASCULAR: No chest pain, orthopnea, edema.  GASTROINTESTINAL: No nausea, vomiting, diarrhea or abdominal pain.  GENITOURINARY: No dysuria, hematuria.  ENDOCRINE: No polyuria, nocturia,  HEMATOLOGY: No anemia, easy bruising or bleeding SKIN: No rash or lesion. MUSCULOSKELETAL: Right wrist  infection  NEUROLOGIC: No tingling, numbness, weakness.  PSYCHIATRY: No anxiety or depression.   MEDICATIONS AT HOME:   Prior to Admission medications   Medication Sig Start Date End Date Taking? Authorizing Provider  ACCU-CHEK SMARTVIEW test strip USE AS INSTRUCTED TO CHECK  BLOOD GLUCOSE ONCE DAILY if desired; LON 99 months; Dx E11.9 12/17/17  Yes Lada, Satira Anis, MD  acetaminophen (TYLENOL) 500 MG tablet Take 500-1,000 mg by mouth every 6 (six) hours as needed (for pain).   Yes [provider]  ADVAIR DISKUS 100-50 MCG/DOSE AEPB USE 1 PUFF TWO TIMES DAILY 05/29/17  Yes Roselee Nova, MD  albuterol (PROVENTIL HFA;VENTOLIN HFA) 108 (90 Base) MCG/ACT inhaler Inhale 2 puffs into the lungs every 6 (six) hours as needed for wheezing or shortness of breath. 10/02/17  Yes Roselee Nova, MD  aspirin 81 MG chewable tablet Chew 81 mg by mouth daily.   Yes [provider]  butalbital-acetaminophen-caffeine (FIORICET, ESGIC) [phone] MG tablet Take 1-2 tablets by mouth every 6 (six) hours as needed for headache. 12/23/17 12/23/18 Yes Merlyn Lot, MD  Carboxymeth-Glycerin-Polysorb (REFRESH OPTIVE ADVANCED OP) Place 1 drop into both eyes 3 (three) times daily as needed (for dry/irritated eyes).   Yes [provider]  carvedilol (COREG) 6.25 MG tablet Take 1 tablet (6.25 mg total) by mouth 2 (two) times daily with a meal. 11/26/17  Yes Lada, Satira Anis, MD  ceFAZolin (ANCEF) IVPB Inject 2 g into the vein every 8 (eight) hours. Indication: Osteomyelitis  Last Day of Therapy: 03/25/18 Labs - Once weekly:  CBC/D and BMP, Labs - Every other week:  ESR and CRP 02/13/18 03/27/19 Yes Fritzi Mandes, MD  latanoprost (XALATAN) 0.005 % ophthalmic solution Place 1 drop at bedtime into the right eye.   Yes [provider]  lisinopril (PRINIVIL,ZESTRIL) 10 MG tablet Take 1 tablet (10 mg total) by mouth daily. 12/10/17  Yes Lada, Satira Anis, MD  metFORMIN (GLUCOPHAGE) 850 MG tablet Take  1 tablet (850 mg total) by mouth 2 (two) times daily with a meal. 10/08/17  Yes Roselee Nova, MD  Multiple Vitamin (MULTIVITAMIN) tablet Take 2 tablets by mouth daily.    Yes [provider]  Nutritional Supplements (ESTROVEN PO) Take 1 tablet by mouth daily.   Yes [provider]  oxyCODONE-acetaminophen (PERCOCET/ROXICET) 5-325 MG tablet Take 1 tablet by mouth every 6 (six) hours as needed for moderate pain or severe pain. 02/13/18  Yes Fritzi Mandes, MD  simvastatin (ZOCOR) 20 MG tablet TAKE 1 TABLET BY MOUTH AT  BEDTIME 08/31/17  Yes Roselee Nova, MD      VITAL SIGNS:  Blood pressure (!) 151/84, pulse 90, temperature 98 F (36.7 C), temperature source Oral, resp. rate 16, height 5' 5"  (1.651 m), weight 53.5 kg (118 lb), SpO2 100 %.  PHYSICAL EXAMINATION:  GENERAL:  77 y.o.-year-old patient lying in the bed with no  acute distress.  EYES: Pupils equal, round, reactive to light and accommodation. No scleral icterus. Extraocular muscles intact.  HEENT: Head atraumatic, normocephalic. Oropharynx and nasopharynx clear.  NECK:  Supple, no jugular venous distention. No thyroid enlargement, no tenderness.  LUNGS: Normal breath sounds bilaterally, no wheezing, rales,rhonchi or crepitation. No use of accessory muscles of respiration.  CARDIOVASCULAR: S1, S2 normal. No murmurs, rubs, or gallops.  ABDOMEN: Soft, nontender, nondistended. Bowel sounds present. No organomegaly or mass.  EXTREMITIES: Right wrist clean dressing erythematous, NEUROLOGIC: Cranial nerves II through XII are intact. Muscle strength 5/5 in all extremities. Sensation intact. Gait not checked.  PSYCHIATRIC: The patient is alert and oriented x 3.  SKIN: No obvious rash, lesion, or ulcer.   LABORATORY PANEL:   CBC Recent Labs  Lab 02/18/18 1350  WBC 19.3*  HGB 5.2*  HCT 16.1*  PLT 221    ------------------------------------------------------------------------------------------------------------------  Chemistries  Recent Labs  Lab 02/18/18 1350  NA 132*  K 3.8  CL 95*  CO2 27  GLUCOSE 160*  BUN 21*  CREATININE 0.56  CALCIUM 8.3*  AST 29  ALT 12*  ALKPHOS 90  BILITOT 0.4   ------------------------------------------------------------------------------------------------------------------  Cardiac Enzymes No results for input(s): TROPONINI in the last 168 hours. ------------------------------------------------------------------------------------------------------------------  RADIOLOGY:  No results found.  EKG:   Orders placed or performed during the hospital encounter of 03/08/17  . ED EKG  . ED EKG  . EKG 12-Lead  . EKG 12-Lead    IMPRESSION AND PLAN:   Julie Holland  is a 77 y.o. female with a known history of chronic kidney disease stage I, diabetes mellitus, hypertension, hyperlipidemia was recently admitted to the hospital with right wrist fracture repaired by Dr. Rudene Christians and eventually had osteomyelitis discharged home with IV Ancef 2 g every 8 hours her routine lab work has revealed hemoglobin 5.5.  And patient is sent over to the emergency department for abnormal labs.  Stool for occult blood is positive in the ED.  Patient is a symptomatic other than tiredness and fatigue.  #Symptomatic anemia Admit to MedSurg unit Hemoglobin is at 5.5 back in May 2019 hemoglobin was around 11.5 Stool for occult blood is positive in the emergency department Transfused 2 units of blood and monitor hemoglobin hematocrit closely Hemodynamically stable and no  profuse bleeding    #GI bleed-stool for occult blood is positive Clear liquid diet and Protonix drip GI consult placed Transfused 2 units of blood and monitor hemoglobin and hematocrit closely  denies intake of any NSAIDs but she does take 81 mg aspirin-holding aspirin   #Recent history of right hand  abscess status post I n D and osteomyelitis of right wrist Continue Ancef 2gm iv every 8 hours Continue wound care and Occupational Therapy Consult placed to Dr. Rudene Christians  #Hyponatremia provide gentle hydration with IV fluids and recheck in a.m.   #Hypertension-continue Coreg and hold lisinopril  # hyperlipidemia continue Zocor  #Diabetes mellitus Currently patient is on clear liquid diet hold metformin and provide sliding scale insulin    All the recorrds are reviewed and case discussed with ED provider. Management plans discussed with the patient, family and they are in agreement.  CODE STATUS: fc   TOTAL TIME TAKING CARE OF THIS PATIENT: 43  minutes.   Note: This dictation was prepared with Dragon dictation along with smaller phrase technology. Any transcriptional errors that result from this process are unintentional.  Nicholes Mango M.D on 02/18/2018 at 4:48 PM  Between 7am to 6pm -  Pager - [phone]  After 6pm go to [email or web address] - password [email or web address] [place] [facility]  Office  [phone]  CC: Primary care physician; [name], MD

## 2018-02-18 NOTE — Telephone Encounter (Signed)
Left another detailed voicemail to call and schedule hospital f/u.  Looks like ammie has also reached out to try and schedule as well.

## 2018-02-18 NOTE — Consult Note (Signed)
ORTHOPAEDICS CONSULT: Records reviewed and patient evaluated.  The patient is a 77 year old left-hand-dominant female who underwent incision, irrigation, and debridement of the right hand and wrist on 02/11/2018 by Dr. Gordy Levan for cellulitis/abscess.  Cultures were positive for methicillin sensitive staph aureus.  During that hospitalization a PICC line was placed on the patient was started on IV Ancef.  The patient was evaluated by Dr. Rudene Christians yesterday in the office and the dressing was changed.  I will notify Dr. Rudene Christians of the patient's admission.  PLEASE NOTE: The patient did NOT sustain a fracture to the hand or wrist.  Laurice Record. Holley Bouche M.D.

## 2018-02-18 NOTE — ED Notes (Signed)
Report called to erica rn floor nurse 

## 2018-02-18 NOTE — ED Notes (Signed)
Resumed care from [place] rn.  Pt alert.  Pt waiting on room assignment for admission.  nsr on monitor.  Iv in place family with pt

## 2018-02-18 NOTE — ED Notes (Signed)
ED Provider at bedside. 

## 2018-02-18 NOTE — ED Provider Notes (Addendum)
[facility] Emergency Department Provider Note  ____________________________________________   First MD Initiated Contact with Patient 02/18/18 1335     (approximate)  I have reviewed the triage vital signs and the nursing notes.   HISTORY  Chief Complaint Anemia   HPI Julie Holland is a 77 y.o. female who sent to the emergency department by her primary care physician for hemoglobin of 5 checked on outpatient labs.  The patient has no history of anemia.  No history of GI bleeding.  Takes no PPIs or H2 blockers.  She does take 81 mg of aspirin daily but no nonsteroidals and no blood thinning medicine.  She does not drink alcohol.  She has never had an endoscopy.  She has had colonoscopies which have all been "normal".  She was recently admitted to the hospital for a complex right hand fracture requiring IV antibiotics and she has a PICC line.  She does report gradual onset slowly progressive now moderate severity exertional shortness of breath and fatigue.  She feels more "tired than normal".  Symptoms worse with exertion improved with rest.  Her symptoms are nonradiating.  She is in moderate severity pain of her right hand but no other pain.  Past Medical History:  Diagnosis Date  . Arthritis   . Asthma   . 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  . Cataract   . Chronic kidney disease    stage 1  . Diabetes mellitus    type II  . Dry eye of right side   . GERD (gastroesophageal reflux disease)    OCC  . Glaucoma 2018   RIGHT EYE   . Hemorrhoids   . Hyperlipidemia   . Hypertension     Patient Active Problem List   Diagnosis Date Noted  . GI bleed 02/18/2018  . Cellulitis 02/10/2018  . Need for vaccination with 13-polyvalent pneumococcal conjugate vaccine 12/10/2017  . Urine test positive for microalbuminuria 12/10/2017  . Anemia 05/02/2017  . Anemia associated with acute blood loss  03/09/2017  . Arthritis of left knee 12/10/2016  . Neutropenia ([place]) 10/20/2016  . Thrombocytopenia ([place]) 10/20/2016  . Postoperative hemorrhage involving digestive system following digestive system procedure 08/04/2016  . Rectal bleeding 07/31/2016  . 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 Asthma 08/17/2015  . Umbilical hernia without obstruction and without gangrene 02/27/2015  . 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 Routine general medical examination at a health care facility 03/15/2013  . 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 Shoulder pain 11/23/2012  . Right bundle branch block 11/11/2012  . Diabetes mellitus, controlled ([place])   . Hyperlipidemia   . Hypertension     Past Surgical History:  Procedure Laterality Date  . CATARACT EXTRACTION, BILATERAL    . CHOLECYSTECTOMY    . COLONOSCOPY  02/13/13   Dr Bary Castilla  . EPIGASTRIC HERNIA REPAIR N/A 03/20/2015   Procedure: HERNIA REPAIR EPIGASTRIC ADULT;  Surgeon: Robert Bellow, MD;  Location: [facility] ORS;  Service: General;  Laterality: N/A;  . HEMORRHOID SURGERY N/A 03/20/2015    FOCAL HIGH-GRADE SQUAMOUS INTRAEPITHELIAL LESION (HSIL, ANAL /HEMORRHOIDECTOMY;   Robert Bellow, MD [facility] ORS;  : General;  Laterality: N/A;  . HERNIA REPAIR  July 2016   Epigastric hernia, primary repair  . INCISION AND DRAINAGE Right 02/11/2018   Procedure: INCISION AND DRAINAGE- RIGHT HAND;  Surgeon: Rudene Christians,  Legrand Como, MD;  Location: [facility] ORS;  Service: Orthopedics;  Laterality: Right;  . TONSILLECTOMY  age 77  . TOTAL ABDOMINAL HYSTERECTOMY  01/1989  . TUBAL LIGATION    . 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  Case has been presented at the [facility] tumor board. No indication for additional treatment outside of serial exams  Surgeon: Robert Bellow, MD;  Location: [facility] ORS;  Service: General;  Laterality: N/A;     Prior to Admission medications   Medication Sig Start Date End Date Taking? Authorizing Provider  ACCU-CHEK SMARTVIEW test strip USE AS INSTRUCTED TO CHECK  BLOOD GLUCOSE ONCE DAILY if desired; LON 99 months; Dx E11.9 12/17/17  Yes Lada, Satira Anis, MD  acetaminophen (TYLENOL) 500 MG tablet Take 500-1,000 mg by mouth every 6 (six) hours as needed (for pain).   Yes [provider]  ADVAIR DISKUS 100-50 MCG/DOSE AEPB USE 1 PUFF TWO TIMES DAILY 05/29/17  Yes Roselee Nova, MD  albuterol (PROVENTIL HFA;VENTOLIN HFA) 108 (90 Base) MCG/ACT inhaler Inhale 2 puffs into the lungs every 6 (six) hours as needed for wheezing or shortness of breath. 10/02/17  Yes Roselee Nova, MD  aspirin 81 MG chewable tablet Chew 81 mg by mouth daily.   Yes [provider]  butalbital-acetaminophen-caffeine (FIORICET, ESGIC) [id number] MG tablet Take 1-2 tablets by mouth every 6 (six) hours as needed for headache. 12/23/17 12/23/18 Yes Merlyn Lot, MD  Carboxymeth-Glycerin-Polysorb (REFRESH OPTIVE ADVANCED OP) Place 1 drop into both eyes 3 (three) times daily as needed (for dry/irritated eyes).   Yes [provider]  carvedilol (COREG) 6.25 MG tablet Take 1 tablet (6.25 mg total) by mouth 2 (two) times daily with a meal. 11/26/17  Yes Lada, Satira Anis, MD  ceFAZolin (ANCEF) IVPB Inject 2 g into the vein every 8 (eight) hours. Indication: Osteomyelitis  Last Day of Therapy: 03/25/18 Labs - Once weekly:  CBC/D and BMP, Labs - Every other week:  ESR and CRP 02/13/18 03/27/19 Yes Fritzi Mandes, MD  latanoprost (XALATAN) 0.005 % ophthalmic solution Place 1 drop at bedtime into the right eye.   Yes [provider]  lisinopril (PRINIVIL,ZESTRIL) 10 MG tablet Take 1 tablet (10 mg total) by mouth daily. 12/10/17  Yes Lada, Satira Anis, MD  metFORMIN (GLUCOPHAGE) 850 MG tablet Take 1 tablet (850 mg total) by mouth 2 (two) times daily with a meal. 10/08/17  Yes Roselee Nova, MD  Multiple Vitamin  (MULTIVITAMIN) tablet Take 2 tablets by mouth daily.    Yes [provider]  Nutritional Supplements (ESTROVEN PO) Take 1 tablet by mouth daily.   Yes [provider]  oxyCODONE-acetaminophen (PERCOCET/ROXICET) 5-325 MG tablet Take 1 tablet by mouth every 6 (six) hours as needed for moderate pain or severe pain. 02/13/18  Yes Fritzi Mandes, MD  simvastatin (ZOCOR) 20 MG tablet TAKE 1 TABLET BY MOUTH AT  BEDTIME 08/31/17  Yes Roselee Nova, MD    Allergies Hydrocodone  Family History  Problem Relation Age of Onset  . Bladder Cancer Mother   . Colon cancer Father   . Lung cancer Brother   . Lymphoma Sister   . Breast cancer Neg Hx     Social History Social History   Tobacco Use  . Smoking status: Never Smoker  . Smokeless tobacco: Never Used  . Tobacco comment: smoking cessation materials not required  Substance  Use Topics  . Alcohol use: No  . Drug use: No    Review of Systems Constitutional: No fever/chills Eyes: No visual changes. ENT: No sore throat. Cardiovascular: Denies chest pain. Respiratory: Denies shortness of breath. Gastrointestinal: No abdominal pain.  No nausea, no vomiting.  No diarrhea.  No constipation. Genitourinary: Negative for dysuria. Musculoskeletal: Positive right hand pain Skin: Negative for rash. Neurological: Negative for headaches, focal weakness or numbness.   ____________________________________________   PHYSICAL EXAM:  VITAL SIGNS: ED Triage Vitals  Enc Vitals Group     BP 02/18/18 1324 (!) 148/64     Pulse Rate 02/18/18 1324 (!) 102     Resp 02/18/18 1324 16     Temp 02/18/18 1324 98.6 F (37 C)     Temp Source 02/18/18 1324 Oral     SpO2 02/18/18 1324 97 %     Weight 02/18/18 1324 118 lb (53.5 kg)     Height 02/18/18 1324 _0  (1.651 m)     Head Circumference --      Peak Flow --      Pain Score 02/18/18 1330 5     Pain Loc --      Pain Edu? --      Excl. in [place]? --     Constitutional: Alert and  oriented x4 pleasant cooperative quite pale appearing Eyes: PERRL EOMI. significant conjunctival pallor Head: Atraumatic. Nose: No congestion/rhinnorhea. Mouth/Throat: No trismus Neck: No stridor.   Cardiovascular: Tachycardic rate, regular rhythm. Grossly normal heart sounds.  Good peripheral circulation. Respiratory: Normal respiratory effort.  No retractions. Lungs CTAB and moving good air Gastrointestinal: Soft nontender Rectal exam performed with female nurse chaperone [place]: Normal external exam guaiac positive control positive brown stool Musculoskeletal: Right upper extremity in splint neurovascularly intact Neurologic:  Normal speech and language. No gross focal neurologic deficits are appreciated. Skin:  Skin is warm, dry and intact. No rash noted. Psychiatric: Mood and affect are normal. Speech and behavior are normal.    ____________________________________________   DIFFERENTIAL includes but not limited to  Upper GI bleed, lower GI bleed, hemolysis, anemia of chronic disease ____________________________________________   LABS (all labs ordered are listed, but only abnormal results are displayed)  Labs Reviewed  COMPREHENSIVE METABOLIC PANEL - Abnormal; Notable for the following components:      Result Value   Sodium 132 (*)    Chloride 95 (*)    Glucose, Bld 160 (*)    BUN 21 (*)    Calcium 8.3 (*)    Albumin 2.6 (*)    ALT 12 (*)    All other components within normal limits  CBC WITH DIFFERENTIAL/PLATELET - Abnormal; Notable for the following components:   WBC 19.3 (*)    RBC 1.82 (*)    Hemoglobin 5.2 (*)    HCT 16.1 (*)    RDW 17.0 (*)    nRBC 1 (*)    Neutro Abs 13.5 (*)    Monocytes Absolute 2.1 (*)    Eosinophils Absolute 1.0 (*)    Basophils Absolute 0.4 (*)    All other components within normal limits  LACTATE DEHYDROGENASE - Abnormal; Notable for the following components:   LDH 264 (*)    All other components within normal limits  IRON AND  TIBC - Abnormal; Notable for the following components:   Iron 24 (*)    Saturation Ratios 9 (*)    All other components within normal limits  FERRITIN  FOLATE  HAPTOGLOBIN  VITAMIN  B12  PATHOLOGIST SMEAR REVIEW  HEMOGLOBIN AND HEMATOCRIT, BLOOD  HEMOGLOBIN AND HEMATOCRIT, BLOOD  HEMOGLOBIN AND HEMATOCRIT, BLOOD  TYPE AND SCREEN  PREPARE RBC (CROSSMATCH)    Lab work reviewed by me shows a hemoglobin of 5.2 down from baseline of around 12 __________________________________________  EKG   ____________________________________________  RADIOLOGY   ____________________________________________   PROCEDURES  Procedure(s) performed: no  .Critical Care Performed by: Darel Hong, MD Authorized by: Darel Hong, MD   Critical care provider statement:    Critical care time (minutes):  30   Critical care time was exclusive of:  Separately billable procedures and treating other patients   Critical care was necessary to treat or prevent imminent or life-threatening deterioration of the following conditions:  Circulatory failure   Critical care was time spent personally by me on the following activities:  Development of treatment plan with patient or surrogate, discussions with consultants, evaluation of patient's response to treatment, examination of patient, obtaining history from patient or surrogate, ordering and performing treatments and interventions, ordering and review of laboratory studies, ordering and review of radiographic studies, pulse oximetry, re-evaluation of patient's condition and review of old charts    Critical Care performed: Yes  Observation: no ____________________________________________   INITIAL IMPRESSION / ASSESSMENT AND PLAN / ED COURSE  Pertinent labs & imaging results that were available during my care of the patient were reviewed by me and considered in my medical decision making (see chart for details).  The patient is borderline  tachycardic, pale, with symptomatic anemia.  She is guaiac positive although without melena.  I discussed with the patient and her husband at bedside that her hemoglobin has drifted down from 12-5 over the past month or so and I am concerned as we do not have an etiology of this new anemia.  Given the GI bleed I will place her on a PPI but she will require inpatient admission for blood transfusion and work-up of her newfound anemia.  Prior to blood transfusion I have added on LDH, iron studies, B12 and folate, as well as haptoglobin.     ----------------------------------------- 3:28 PM on 02/18/2018 -----------------------------------------  Lab called me that the patient's hemoglobin is 5.2.  At this point she requires inpatient admission for blood transfusion, PPI, and continued work-up of her new sudden anemia.  The patient and family verbalized understanding agree with the plan.  I discussed with the hospitalist who has graciously agreed to admit the patient to her service.  ____________________________________________   FINAL CLINICAL IMPRESSION(S) / ED DIAGNOSES  Final diagnoses:  Iron deficiency anemia, unspecified iron deficiency anemia type  Gastrointestinal hemorrhage, unspecified gastrointestinal hemorrhage type      NEW MEDICATIONS STARTED DURING THIS VISIT:  New Prescriptions   No medications on file     Note:  This document was prepared using Dragon voice recognition software and may include unintentional dictation errors.     Darel Hong, MD 02/18/18 1528    Darel Hong, MD 02/18/18 1555

## 2018-02-18 NOTE — Telephone Encounter (Signed)
[facility] called me about 10 minutes earlier; we tried to reach the patient, left voicemail instructing her to go right to the ER for a critical lab; [place] nurse was going to continue to try to reach her ----------------------------------------- I personally called patient back and she picked up this time Patient says that the home health nurse drew her blood yesterday She has not seen any dark stools; just one BM since leaving the hospital Tired and [facility] Husband will take her to the ER in 10 minutes, ASAP; if he's not back, I instructed her to call 911 She has never had a stomach ulcer before She is not taking any NSAIDs Stop aspirin, take all med bottles to the ER Don't take any metformin until they give the go ahead I also let her know that we have been trying to contact her to schedule her hospital f/u; we will really want to see her here after this hospital/ER stay; she verbalized understanding -------------------------------------------- Please contact home health Find out who ordered what and get a copy of the labs to me; I haven't actually seen patient since she left the hospital and didn't order any labs, so I need to see what else is going on ASAP

## 2018-02-18 NOTE — Telephone Encounter (Signed)
Left detailed voicemial with Butch Penny, home health to give [place] a call back about this pt and blood work and who ordered the blood work

## 2018-02-18 NOTE — Progress Notes (Signed)
Family Meeting Note  Advance Directive:yes  Today a meeting took place with the Patient.    The following clinical team members were present during this meeting:MD  The following were discussed:Patient's diagnosis: GI bleed, symptomatic anemia, fatigue, hyponatremia recent right wrist fracture with osteomyelitis, other comorbidities, including hypertension, hyperlipidemia, diabetes mellitus, treatment plan of care discussed in detail with the patient and son at bedside.  They both verbalized understanding of the plan   patient's progosis: Unable to determine and Goals for treatment: Full Code  Husband is the healthcare POA  Additional follow-up to be provided: Hospitalist and gastroenterology  Time spent during discussion:17 min  Nicholes Mango, MD

## 2018-02-19 ENCOUNTER — Encounter
Admission: EM | Disposition: A | Discharge status: Home Health | Payer: Self-pay | Source: Home / Self Care | Attending: Family Medicine

## 2018-02-19 ENCOUNTER — Inpatient Hospital Stay: Payer: Medicare Other | Admitting: Anesthesiology

## 2018-02-19 DIAGNOSIS — K2970 Gastritis, unspecified, without bleeding: Secondary | ICD-10-CM

## 2018-02-19 DIAGNOSIS — D62 Acute posthemorrhagic anemia: Secondary | ICD-10-CM

## 2018-02-19 DIAGNOSIS — K279 Peptic ulcer, site unspecified, unspecified as acute or chronic, without hemorrhage or perforation: Secondary | ICD-10-CM

## 2018-02-19 DIAGNOSIS — D649 Anemia, unspecified: Secondary | ICD-10-CM

## 2018-02-19 HISTORY — PX: ESOPHAGOGASTRODUODENOSCOPY: SHX5428

## 2018-02-19 LAB — GLUCOSE, CAPILLARY
Glucose-Capillary: 87 mg/dL (ref 65–99)
Glucose-Capillary: 148 mg/dL — ABNORMAL HIGH (ref 65–99)
Glucose-Capillary: 174 mg/dL — ABNORMAL HIGH (ref 65–99)
Glucose-Capillary: 168 mg/dL — ABNORMAL HIGH (ref 65–99)
Glucose-Capillary: 116 mg/dL — ABNORMAL HIGH (ref 65–99)
Glucose-Capillary: 113 mg/dL — ABNORMAL HIGH (ref 65–99)
Glucose-Capillary: 122 mg/dL — ABNORMAL HIGH (ref 65–99)
Glucose-Capillary: 83 mg/dL (ref 65–99)
Glucose-Capillary: 111 mg/dL — ABNORMAL HIGH (ref 65–99)

## 2018-02-19 LAB — COMPREHENSIVE METABOLIC PANEL
ALT: 9 U/L — ABNORMAL LOW (ref 14–54)
AST: 28 U/L (ref 15–41)
Albumin: 2.5 g/dL — ABNORMAL LOW (ref 3.5–5.0)
Alkaline Phosphatase: 85 U/L (ref 38–126)
Anion gap: 9 (ref 5–15)
BUN: 11 mg/dL (ref 6–20)
CO2: 30 mmol/L (ref 22–32)
Calcium: 8.1 mg/dL — ABNORMAL LOW (ref 8.9–10.3)
Chloride: 95 mmol/L — ABNORMAL LOW (ref 101–111)
Creatinine, Ser: 0.4 mg/dL — ABNORMAL LOW (ref 0.44–1.00)
GFR calc Af Amer: >60 mL/min (ref >60)
GFR calc non Af Amer: >60 mL/min (ref >60)
Glucose, Bld: 127 mg/dL — ABNORMAL HIGH (ref 65–99)
Potassium: 3.6 mmol/L (ref 3.5–5.1)
Sodium: 134 mmol/L — ABNORMAL LOW (ref 135–145)
Total Bilirubin: 1.3 mg/dL — ABNORMAL HIGH (ref 0.3–1.2)
Total Protein: 6.6 g/dL (ref 6.5–8.1)

## 2018-02-19 LAB — HEMOGLOBIN AND HEMATOCRIT, BLOOD
HCT: 23.2 % — ABNORMAL LOW (ref 35.0–47.0)
Hemoglobin: 7.8 g/dL — ABNORMAL LOW (ref 12.0–16.0)

## 2018-02-19 LAB — CBC
HCT: 22.6 % — ABNORMAL LOW (ref 35.0–47.0)
Hemoglobin: 7.8 g/dL — ABNORMAL LOW (ref 12.0–16.0)
MCH: 29.7 pg (ref 26.0–34.0)
MCHC: 34.3 g/dL (ref 32.0–36.0)
MCV: 86.6 fL (ref 80.0–100.0)
Platelets: 193 10*3/uL (ref 150–440)
RBC: 2.61 MIL/uL — ABNORMAL LOW (ref 3.80–5.20)
RDW: 15.6 % — ABNORMAL HIGH (ref 11.5–14.5)
WBC: 15.6 10*3/uL — ABNORMAL HIGH (ref 3.6–11.0)

## 2018-02-19 LAB — PATHOLOGIST SMEAR REVIEW

## 2018-02-19 LAB — HAPTOGLOBIN: Haptoglobin: 244 mg/dL — ABNORMAL HIGH (ref 34–200)

## 2018-02-19 LAB — HEMOGLOBIN A1C
Hgb A1c MFr Bld: 6.7 % — ABNORMAL HIGH (ref 4.8–5.6)
Mean Plasma Glucose: 145.59 mg/dL

## 2018-02-19 SURGERY — EGD (ESOPHAGOGASTRODUODENOSCOPY)
Anesthesia: General

## 2018-02-19 MED ORDER — GLYCOPYRROLATE 0.2 MG/ML IJ SOLN
INTRAMUSCULAR | Status: AC
  Filled 2018-02-19: qty 1

## 2018-02-19 MED ORDER — LIDOCAINE HCL (CARDIAC) PF 100 MG/5ML IV SOSY
PREFILLED_SYRINGE | INTRAVENOUS | Status: DC | PRN
  Administered 2018-02-19: 40 mg via INTRAVENOUS

## 2018-02-19 MED ORDER — GLYCOPYRROLATE 0.2 MG/ML IJ SOLN
INTRAMUSCULAR | Status: DC | PRN
Start: 2018-02-19 — End: 2018-02-19
  Administered 2018-02-19: 0.2 mg via INTRAVENOUS

## 2018-02-19 MED ORDER — SODIUM CHLORIDE 0.9 % IV SOLN
INTRAVENOUS | Status: DC | PRN
  Administered 2018-02-19: 16:00:00 via INTRAVENOUS

## 2018-02-19 MED ORDER — PROPOFOL 10 MG/ML IV BOLUS
INTRAVENOUS | Status: DC | PRN
  Administered 2018-02-19: 50 mg via INTRAVENOUS

## 2018-02-19 MED ORDER — PANTOPRAZOLE SODIUM 40 MG PO TBEC
40.0000 mg | DELAYED_RELEASE_TABLET | Freq: Two times a day (BID) | ORAL | Status: DC
  Administered 2018-02-20: 40 mg via ORAL
  Filled 2018-02-19: qty 1

## 2018-02-19 MED ORDER — SODIUM CHLORIDE 0.9 % IV SOLN
Freq: Once | INTRAVENOUS | Status: AC
  Administered 2018-02-19: 17:00:00 via INTRAVENOUS

## 2018-02-19 MED ORDER — PROPOFOL 500 MG/50ML IV EMUL
INTRAVENOUS | Status: AC
  Filled 2018-02-19: qty 50

## 2018-02-19 MED ORDER — PROPOFOL 500 MG/50ML IV EMUL
INTRAVENOUS | Status: DC | PRN
  Administered 2018-02-19: 140 ug/kg/min via INTRAVENOUS
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
## 2018-02-19 NOTE — Consult Note (Signed)
Cephas Darby, MD [address]  [place]  [place], [place] [address]  Main: [phone]  Fax: [phone] Pager: [phone]   Consultation  Referring Provider:     No ref. provider found Primary Care Physician:  [name], MD Primary Gastroenterologist:  Dr. Sherri Sear         Reason for Consultation:     Acute anemia  Date of Admission:  02/18/2018 Date of Consultation:  02/19/2018         HPI:   Julie Holland is a 77 y.o.Caucasian female with recent history of right hand cellulitis and abscess after she fell in the yard on 02/04/2018 status post debridement and drainage of abscess on 02/11/2018, secondary to MSSA infection. She was discharged home on IV antibiotics via PICC on 02/13/2018, she was seen by Dr. Sanda Klein as outpatient and was found to have hemoglobin of 5.2. Therefore sent to the emergency room and got admitted. She received 2 units of PRBCs and responded appropriately. Patient's hemoglobin is between 11 and 12 at baseline.her hemoglobin was 9.4 on 02/10/2018, 7.6 on 02/13/2018 when she was discharged during last admission. Patient's husband is bedside who provides the history along with the patient. He reported that patient had significant blood loss during the time of surgery. Patient denies epigastric pain, nausea, vomiting, hematemesis, coffee-ground emesis, melena, rectal bleeding. She does not take iron pills at home. She is functionally independent of her ADLs. She lost about 7-8 pounds within last couple of weeks since she fell.  Her husband is frustrated as he has a lot of questions unanswered and no one is telling him why she is anemic and he thinks her most of her anemia is secondary to blood loss during hand surgery She denies having any GI surgeries in the past She does not smoke or drink alcohol Patient is on clear liquid diet and started on pantoprazole drip. GI is consulted for acute on chronic anemia  NSAIDs: none including BC powder or  Goody powder  Antiplts/Anticoagulants/Anti thrombotics: aspirin 81  GI Procedures:   Colonoscopy 10/2002 by Dr. Bary Castilla: Normal Colonoscopy 05/2013 by Dr Bary Castilla, showed severe diverticulosis in the rectosigmoid area   Past Medical History:  Diagnosis Date  . Arthritis   . Asthma   . 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  . Cataract   . Chronic kidney disease    stage 1  . Diabetes mellitus    type II  . Dry eye of right side   . GERD (gastroesophageal reflux disease)    OCC  . Glaucoma 2018   RIGHT EYE   . Hemorrhoids   . Hyperlipidemia   . Hypertension     Past Surgical History:  Procedure Laterality Date  . CATARACT EXTRACTION, BILATERAL    . CHOLECYSTECTOMY    . COLONOSCOPY  02/13/13   Dr Bary Castilla  . EPIGASTRIC HERNIA REPAIR N/A 03/20/2015   Procedure: HERNIA REPAIR EPIGASTRIC ADULT;  Surgeon: Robert Bellow, MD;  Location: [facility] ORS;  Service: General;  Laterality: N/A;  . HEMORRHOID SURGERY N/A 03/20/2015    FOCAL HIGH-GRADE SQUAMOUS INTRAEPITHELIAL LESION (HSIL, ANAL /HEMORRHOIDECTOMY;   Robert Bellow, MD [facility] ORS;  : General;  Laterality: N/A;  . HERNIA REPAIR  July 2016   Epigastric hernia, primary repair  . INCISION AND DRAINAGE Right 02/11/2018   Procedure: INCISION AND DRAINAGE- RIGHT HAND;  Surgeon: Hessie Knows, MD;  Location:  [facility] ORS;  Service: Orthopedics;  Laterality: Right;  . TONSILLECTOMY  age 4  . TOTAL ABDOMINAL HYSTERECTOMY  01/1989  . TUBAL LIGATION    . 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  Case has been presented at the [facility] tumor board. No indication for additional treatment outside of serial exams  Surgeon: Robert Bellow, MD;  Location: [facility] ORS;  Service: General;  Laterality: N/A;    Prior to Admission medications   Medication Sig Start Date End Date Taking? Authorizing  Provider  ACCU-CHEK SMARTVIEW test strip USE AS INSTRUCTED TO CHECK  BLOOD GLUCOSE ONCE DAILY if desired; LON 99 months; Dx E11.9 12/17/17  Yes Lada, Satira Anis, MD  acetaminophen (TYLENOL) 500 MG tablet Take 500-1,000 mg by mouth every 6 (six) hours as needed (for pain).   Yes [provider]  ADVAIR DISKUS 100-50 MCG/DOSE AEPB USE 1 PUFF TWO TIMES DAILY 05/29/17  Yes Roselee Nova, MD  albuterol (PROVENTIL HFA;VENTOLIN HFA) 108 (90 Base) MCG/ACT inhaler Inhale 2 puffs into the lungs every 6 (six) hours as needed for wheezing or shortness of breath. 10/02/17  Yes Roselee Nova, MD  aspirin 81 MG chewable tablet Chew 81 mg by mouth daily.   Yes [provider]  butalbital-acetaminophen-caffeine (FIORICET, ESGIC) [id number] MG tablet Take 1-2 tablets by mouth every 6 (six) hours as needed for headache. 12/23/17 12/23/18 Yes Merlyn Lot, MD  Carboxymeth-Glycerin-Polysorb (REFRESH OPTIVE ADVANCED OP) Place 1 drop into both eyes 3 (three) times daily as needed (for dry/irritated eyes).   Yes [provider]  carvedilol (COREG) 6.25 MG tablet Take 1 tablet (6.25 mg total) by mouth 2 (two) times daily with a meal. 11/26/17  Yes Lada, Satira Anis, MD  ceFAZolin (ANCEF) IVPB Inject 2 g into the vein every 8 (eight) hours. Indication: Osteomyelitis  Last Day of Therapy: 03/25/18 Labs - Once weekly:  CBC/D and BMP, Labs - Every other week:  ESR and CRP 02/13/18 03/27/19 Yes Fritzi Mandes, MD  latanoprost (XALATAN) 0.005 % ophthalmic solution Place 1 drop at bedtime into the right eye.   Yes [provider]  lisinopril (PRINIVIL,ZESTRIL) 10 MG tablet Take 1 tablet (10 mg total) by mouth daily. 12/10/17  Yes Lada, Satira Anis, MD  metFORMIN (GLUCOPHAGE) 850 MG tablet Take 1 tablet (850 mg total) by mouth 2 (two) times daily with a meal. 10/08/17  Yes Roselee Nova, MD  Multiple Vitamin (MULTIVITAMIN) tablet Take 2 tablets by mouth daily.    Yes [provider]    Nutritional Supplements (ESTROVEN PO) Take 1 tablet by mouth daily.   Yes [provider]  oxyCODONE-acetaminophen (PERCOCET/ROXICET) 5-325 MG tablet Take 1 tablet by mouth every 6 (six) hours as needed for moderate pain or severe pain. 02/13/18  Yes Fritzi Mandes, MD  simvastatin (ZOCOR) 20 MG tablet TAKE 1 TABLET BY MOUTH AT  BEDTIME 08/31/17  Yes Roselee Nova, MD    Family History  Problem Relation Age of Onset  . Bladder Cancer Mother   . Colon cancer Father   . Lung cancer Brother   . Lymphoma Sister   . Breast cancer Neg Hx      Social History   Tobacco Use  . Smoking status: Never Smoker  . Smokeless tobacco: Never Used  . Tobacco comment: smoking cessation materials not required  Substance Use Topics  . Alcohol use: No  .  Drug use: No    Allergies as of 02/18/2018 - Review Complete 02/18/2018  Allergen Reaction Noted  . Hydrocodone Nausea And Vomiting and Other (See Comments) 07/31/2016    Review of Systems:    All systems reviewed and negative except where noted in HPI.   Physical Exam:  Vital signs in last 24 hours: Temp:  [97.8 F (36.6 C)-98.6 F (37 C)] 98.2 F (36.8 C) (06/14 1157) Pulse Rate:  [74-90] 76 (06/14 1157) Resp:  [16-24] 16 (06/14 1157) BP: (140-165)/(69-86) 165/84 (06/14 1157) SpO2:  [98 %-100 %] 98 % (06/14 1157) Last BM Date: (2-3 days per patient) General:   Pleasant, cooperative in NAD, thin built Head:  Normocephalic and atraumatic. Eyes:   No icterus. Conjunctiva pale, PERRLA. Ears:  Normal auditory acuity. Neck:  Supple; no masses or thyroidomegaly Lungs: Respirations even and unlabored. Lungs clear to auscultation bilaterally.   No wheezes, crackles, or rhonchi.  Heart:  Regular rate and rhythm;  Without murmur, clicks, rubs or gallops Abdomen:  Soft, scaphoid, nondistended, nontender. Normal bowel sounds. No appreciable masses or hepatomegaly.  No rebound or guarding.  Rectal:  Not performed. Msk:  Dressing on her  right wrist, Strength normal for her age  Extremities:  Without edema, cyanosis or clubbing. Neurologic:  Alert and oriented x3;  grossly normal neurologically. Skin:  Intact without significant lesions or rashes. Psych:  Alert and cooperative. Normal affect.  LAB RESULTS: CBC Latest Ref Rng & Units 02/19/2018 02/19/2018 02/18/2018  WBC 3.6 - 11.0 K/uL - 15.6(H) -  Hemoglobin 12.0 - 16.0 g/dL 7.8(L) 7.8(L) 5.0(L)  Hematocrit 35.0 - 47.0 % 23.2(L) 22.6(L) 15.6(L)  Platelets 150 - 440 K/uL - 193 -    BMET BMP Latest Ref Rng & Units 02/19/2018 02/18/2018 02/12/2018  Glucose 65 - 99 mg/dL 127(H) 160(H) 169(H)  BUN 6 - 20 mg/dL 11 21(H) 21(H)  Creatinine 0.44 - 1.00 mg/dL 0.40(L) 0.56 0.60  BUN/Creat Ratio 6 - 22 (calc) - - -  Sodium 135 - 145 mmol/L 134(L) 132(L) 132(L)  Potassium 3.5 - 5.1 mmol/L 3.6 3.8 3.9  Chloride 101 - 111 mmol/L 95(L) 95(L) 100(L)  CO2 22 - 32 mmol/L 30 27 20(L)  Calcium 8.9 - 10.3 mg/dL 8.1(L) 8.3(L) 8.2(L)    LFT Hepatic Function Latest Ref Rng & Units 02/19/2018 02/18/2018 02/11/2018  Total Protein 6.5 - 8.1 g/dL 6.6 6.8 6.7  Albumin 3.5 - 5.0 g/dL 2.5(L) 2.6(L) 2.7(L)  AST 15 - 41 U/L 28 29 68(H)  ALT 14 - 54 U/L 9(L) 12(L) 90(H)  Alk Phosphatase 38 - 126 U/L 85 90 142(H)  Total Bilirubin 0.3 - 1.2 mg/dL 1.3(H) 0.4 0.8  Bilirubin, Direct 0.1 - 0.5 mg/dL - - 0.2     STUDIES: No results found.    Impression / Plan:   THAI HEMRICK is a 77 y.o. white female with diabetes, hypertension, chronic kidney disease, perianal cancer with recent history of right wrist cellulitis and abscess, status post debridement and drainage admitted with worsening anemia with no evidence of active GI bleed. Iron studies are consistent with anemia of chronic disease.she does not have B12 deficiency or folate deficiency. Her LDH is mildly elevated and haptoglobin elevated, not consistent with hemolysis. 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 Her anemia is multifactorial. Combination of blood loss from recent surgery based on her history and anemia of chronic disease from active infection of her hand. She had colonoscopies in  the past and unlikely we are dealing with malignancy here. Other possibilities are occult blood loss from stress ulcers in upper GI tract or AVMs She responded appropriately to blood transfusions  Discussed with patient about upper endoscopy and she is agreeable to undergo today Nothing by mouth continue pantoprazole drip for now   I have discussed alternative options, risks & benefits,  which include, but are not limited to, bleeding, infection, perforation,respiratory complication & drug reaction.  The patient agrees with this plan & written consent will be obtained.    Thank you for involving me in the care of this patient.      LOS: 1 day   Sherri Sear, MD  02/19/2018, 3:31 PM   Note: This dictation was prepared with Dragon dictation along with smaller phrase technology. Any transcriptional errors that result from this process are unintentional.

## 2018-02-19 NOTE — Progress Notes (Signed)
Ace wrap loosened, finger swelling and motion improving.  Will want dressing change Monday.

## 2018-02-19 NOTE — Transfer of Care (Signed)
Immediate Anesthesia Transfer of Care Note  Patient: Julie Holland  Procedure(s) Performed: ESOPHAGOGASTRODUODENOSCOPY (EGD) (N/A )  Patient Location: PACU  Anesthesia Type:General  Level of Consciousness: drowsy  Airway & Oxygen Therapy: Patient Spontanous Breathing and Patient connected to nasal cannula oxygen  Post-op Assessment: Report given to RN and Post -op Vital signs reviewed and stable  Post vital signs: Reviewed and stable  Last Vitals:  Vitals Value Taken Time  BP    Temp    Pulse    Resp    SpO2      Last Pain:  Vitals:   02/19/18 1635  TempSrc: Tympanic  PainSc:       Patients Stated Pain Goal: 0 (17/49/44 9675)  Complications: No apparent anesthesia complications

## 2018-02-19 NOTE — Op Note (Signed)
[facility] Gastroenterology Patient Name: Julie Holland Procedure Date: 02/19/2018 4:40 PM MRN: [id number] Account #: [id number] Date of Birth: Dec 31, 1940 Admit Type: Inpatient Age: 77 Room: [facility] ENDO ROOM 2 Gender: Female Note Status: Finalized Procedure:            Upper GI endoscopy Indications:          Acute post hemorrhagic anemia Providers:            Lin Landsman MD, MD Referring MD:         [name] (Referring MD) Medicines:            Monitored Anesthesia Care Complications:        No immediate complications. Estimated blood loss: None. Procedure:            Pre-Anesthesia Assessment:                       - Prior to the procedure, a History and Physical was                        performed, and patient medications and allergies were                        reviewed. The patient is competent. The risks and                        benefits of the procedure and the sedation options and                        risks were discussed with the patient. All questions                        were answered and informed consent was obtained.                        Patient identification and proposed procedure were                        verified by the physician, the nurse, the                        anesthesiologist, the anesthetist and the technician in                        the pre-procedure area in the procedure room in the                        endoscopy suite. Mental Status Examination: alert and                        oriented. Airway Examination: normal oropharyngeal                        airway and neck mobility. Respiratory Examination:                        clear to auscultation. CV Examination: normal.                        Prophylactic Antibiotics: The patient does not require  prophylactic antibiotics. Prior Anticoagulants: The                        patient has taken aspirin, last dose was 1 day prior to               procedure. ASA Grade Assessment: III - A patient with                        severe systemic disease. After reviewing the risks and                        benefits, the patient was deemed in satisfactory                        condition to undergo the procedure. The anesthesia plan                        was to use monitored anesthesia care (MAC). Immediately                        prior to administration of medications, the patient was                        re-assessed for adequacy to receive sedatives. The                        heart rate, respiratory rate, oxygen saturations, blood                        pressure, adequacy of pulmonary ventilation, and                        response to care were monitored throughout the                        procedure. The physical status of the patient was                        re-assessed after the procedure.                       After obtaining informed consent, the endoscope was                        passed under direct vision. Throughout the procedure,                        the patient's blood pressure, pulse, and oxygen                        saturations were monitored continuously. The Endoscope                        was introduced through the mouth, and advanced to the                        second part of duodenum. The upper GI endoscopy was                        accomplished without  difficulty. The patient tolerated                        the procedure well. Findings:      The duodenal bulb and second portion of the duodenum were normal.      Three non-bleeding superficial gastric ulcers with a clean ulcer base       (Forrest Class III) were found in the gastric antrum. The largest lesion       was 5 mm in largest dimension.      Diffuse severe inflammation characterized by congestion (edema) and       erythema was found in the gastric fundus and in the gastric body.       Biopsies were taken with a cold forceps for  Helicobacter pylori testing.      The gastroesophageal junction and examined esophagus were normal. Impression:           - Normal duodenal bulb and second portion of the                        duodenum.                       - Non-bleeding gastric ulcers with a clean ulcer base                        (Forrest Class III).                       - Gastritis. Biopsied.                       - Normal gastroesophageal junction and esophagus. Recommendation:       - Await pathology results.                       - Return patient to hospital ward for ongoing care.                       - Resume regular diet today.                       - Continue present medications.                       - Use Protonix (pantoprazole) 40 mg PO BID for 3 months.                       - Return to GI clinic in 4 weeks. Procedure Code(s):    --- Professional ---                       [phone], Esophagogastroduodenoscopy, flexible, transoral;                        with biopsy, single or multiple Diagnosis Code(s):    --- Professional ---                       K25.9, Gastric ulcer, unspecified as acute or chronic,                        without hemorrhage or perforation  K29.70, Gastritis, unspecified, without bleeding                       D62, Acute posthemorrhagic anemia CPT copyright 2017 American Medical Association. All rights reserved. The codes documented in this report are preliminary and upon coder review may  be revised to meet current compliance requirements. Dr. Ulyess Mort Lin Landsman MD, MD 02/19/2018 4:54:43 PM This report has been signed electronically. Number of Addenda: 0 Note Initiated On: 02/19/2018 4:40 PM      [facility]

## 2018-02-19 NOTE — Progress Notes (Addendum)
[place] [facility] [place] NAME: Julie Holland    MR#:  [id number]  DATE OF BIRTH:  [birth year]  SUBJECTIVE:  CHIEF COMPLAINT:   Chief Complaint  Patient presents with  . Anemia  Patient without complaint, no events overnight  REVIEW OF SYSTEMS:  CONSTITUTIONAL: No fever, fatigue or weakness.  EYES: No blurred or double vision.  EARS, NOSE, AND THROAT: No tinnitus or ear pain.  RESPIRATORY: No cough, shortness of breath, wheezing or hemoptysis.  CARDIOVASCULAR: No chest pain, orthopnea, edema.  GASTROINTESTINAL: No nausea, vomiting, diarrhea or abdominal pain.  GENITOURINARY: No dysuria, hematuria.  ENDOCRINE: No polyuria, nocturia,  HEMATOLOGY: No anemia, easy bruising or bleeding SKIN: No rash or lesion. MUSCULOSKELETAL: No joint pain or arthritis.   NEUROLOGIC: No tingling, numbness, weakness.  PSYCHIATRY: No anxiety or depression.   ROS  DRUG ALLERGIES:   Allergies  Allergen Reactions  . Hydrocodone Nausea And Vomiting and Other (See Comments)    Vertigo but patient takes hydrocodone/acetaminophen outpatient    VITALS:  Blood pressure (!) 165/84, pulse 76, temperature 98.2 F (36.8 C), temperature source Oral, resp. rate 16, height 5\' 5"  (1.651 m), weight 53.5 kg (118 lb), SpO2 98 %.  PHYSICAL EXAMINATION:  GENERAL:  77 y.o.-year-old patient lying in the bed with no acute distress.  EYES: Pupils equal, round, reactive to light and accommodation. No scleral icterus. Extraocular muscles intact.  HEENT: Head atraumatic, normocephalic. Oropharynx and nasopharynx clear.  NECK:  Supple, no jugular venous distention. No thyroid enlargement, no tenderness.  LUNGS: Normal breath sounds bilaterally, no wheezing, rales,rhonchi or crepitation. No use of accessory muscles of respiration.  CARDIOVASCULAR: S1, S2 normal. No murmurs, rubs, or gallops.  ABDOMEN: Soft, nontender, nondistended. Bowel sounds present. No organomegaly or mass.   EXTREMITIES: No pedal edema, cyanosis, or clubbing.  NEUROLOGIC: Cranial nerves II through XII are intact. Muscle strength 5/5 in all extremities. Sensation intact. Gait not checked.  PSYCHIATRIC: The patient is alert and oriented x 3.  SKIN: No obvious rash, lesion, or ulcer.   Physical Exam LABORATORY PANEL:   CBC Recent Labs  Lab 02/19/18 0500 02/19/18 1031  WBC 15.6*  --   HGB 7.8* 7.8*  HCT 22.6* 23.2*  PLT 193  --    ------------------------------------------------------------------------------------------------------------------  Chemistries  Recent Labs  Lab 02/19/18 0500  NA 134*  K 3.6  CL 95*  CO2 30  GLUCOSE 127*  BUN 11  CREATININE 0.40*  CALCIUM 8.1*  AST 28  ALT 9*  ALKPHOS 85  BILITOT 1.3*   ------------------------------------------------------------------------------------------------------------------  Cardiac Enzymes No results for input(s): TROPONINI in the last 168 hours. ------------------------------------------------------------------------------------------------------------------  RADIOLOGY:  No results found.  ASSESSMENT AND PLAN:  Julie Holland  is a 77 y.o. female with a known history of chronic kidney disease stage I, diabetes mellitus, hypertension, hyperlipidemia was recently admitted to the hospital with right wrist fracture repaired by Dr. Rudene Christians and eventually had osteomyelitis discharged home with IV Ancef 2 g every 8 hours her routine lab work has revealed hemoglobin 5.5.  And patient is sent over to the emergency department for abnormal labs.  Stool for occult blood is positive in the ED.  Patient is a symptomatic other than tiredness and fatigue.  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 Rudene Christians for continuity of care  *Acute hyponatremia/hypochloremia IV fluids for rehydration, BMP in the morning  *Chronic benign essential hypertension  stable Continue Coreg and hold lisinopril  *Chronic hyperlipidemia, unspecified Stable Continue Zocor  *Chronic diabetes mellitus type 2 Sliding scale insulin with Accu-Cheks per routine, hold metformin for now    All the records are reviewed and case discussed with Care Management/Social Workerr. Management plans discussed with the patient, family and they are in agreement.  CODE STATUS: full  TOTAL TIME TAKING CARE OF THIS PATIENT: 35 minutes.     POSSIBLE D/C IN 1-3 DAYS, DEPENDING ON CLINICAL CONDITION.   [name] on 02/19/2018   Between 7am to 6pm - Pager - [phone]  After 6pm go to [email or web address] - password [email or web address] [facility]  Office  [phone]  CC: Primary care physician; [name], MD  Note: This dictation was prepared with Dragon dictation along with smaller phrase technology. Any transcriptional errors that result from this process are unintentional.

## 2018-02-19 NOTE — Care Management (Signed)
Patient admitted with GI bleed.  Patient was discharged from this facility on 6/8.  Patient lives at home with husband.  Patient open with [facility] and receiving IV antibiotics.  Jermaine with [facility] notified of admission.  Patient was delivered a platform for her walker on 6/8 prior to discharge.

## 2018-02-19 NOTE — Anesthesia Post-op Follow-up Note (Signed)
Anesthesia QCDR form completed.        

## 2018-02-19 NOTE — Anesthesia Preprocedure Evaluation (Signed)
Anesthesia Evaluation  Patient identified by MRN, date of birth, ID band Patient awake    Reviewed: Allergy & Precautions, H&P , NPO status , Patient's Chart, lab work & pertinent test results, reviewed documented beta blocker date and time   Airway Mallampati: II   Neck ROM: full    Dental  (+) Poor Dentition   Pulmonary neg pulmonary ROS, asthma ,    Pulmonary exam normal        Cardiovascular Exercise Tolerance: Poor hypertension, On Medications negative cardio ROS Normal cardiovascular exam+ dysrhythmias  Rhythm:regular Rate:Normal     Neuro/Psych negative neurological ROS  negative psych ROS   GI/Hepatic negative GI ROS, Neg liver ROS, GERD  Medicated,  Endo/Other  negative endocrine ROSdiabetes, Well Controlled, Type 2, Oral Hypoglycemic Agents  Renal/GU Renal diseasenegative Renal ROS  negative genitourinary   Musculoskeletal   Abdominal   Peds  Hematology negative hematology ROS (+) anemia ,   Anesthesia Other Findings Past Medical History: No date: Arthritis No date: Asthma 03/2015: Cancer ([address])     Comment:  In situ carcinoma of the perianal skin, incidental               finding at hemorrhoidectomy. No date: Cataract No date: Chronic kidney disease     Comment:  stage 1 No date: Diabetes mellitus     Comment:  type II No date: Dry eye of right side No date: GERD (gastroesophageal reflux disease)     Comment:  OCC 2018: Glaucoma     Comment:  RIGHT EYE  No date: Hemorrhoids No date: Hyperlipidemia No date: Hypertension Past Surgical History: No date: CATARACT EXTRACTION, BILATERAL No date: CHOLECYSTECTOMY 02/13/13: COLONOSCOPY     Comment:  Dr Bary Castilla 03/20/2015: EPIGASTRIC HERNIA REPAIR; N/A     Comment:  Procedure: HERNIA REPAIR EPIGASTRIC ADULT;  Surgeon:               Robert Bellow, MD;  Location: [facility] ORS;  Service:               General;  Laterality: N/A; 03/20/2015: HEMORRHOID  SURGERY; N/A     Comment:   FOCAL HIGH-GRADE SQUAMOUS INTRAEPITHELIAL LESION (HSIL,              ANAL /HEMORRHOIDECTOMY;   Robert Bellow, MD [facility];              : General;  Laterality: N/A; July 2016: HERNIA REPAIR     Comment:  Epigastric hernia, primary repair 02/11/2018: INCISION AND DRAINAGE; Right     Comment:  Procedure: INCISION AND DRAINAGE- RIGHT HAND;  Surgeon:               Hessie Knows, MD;  Location: [facility] OR[facility];  Service:               Orthopedics;  Laterality: Right; age 77: TONSILLECTOMY 01/1989: TOTAL ABDOMINAL HYSTERECTOMY No date: TUBAL LIGATION 07/18/2016: TUMOR EXCISION; N/A     Comment:  EXCISION RECTAL MASS; 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  Case has been               presented at the [facility] tumor board. No indication for               additional treatment outside of serial exams  Surgeon:  Robert Bellow, MD;  Location: [facility] ORS;  Service:               General;  Laterality: N/A; BMI    Body Mass Index:  19.64 kg/m     Reproductive/Obstetrics negative OB ROS                             Anesthesia Physical Anesthesia Plan  ASA: III  Anesthesia Plan: General   Post-op Pain Management:    Induction:   PONV Risk Score and Plan:   Airway Management Planned:   Additional Equipment:   Intra-op Plan:   Post-operative Plan:   Informed Consent: I have reviewed the patients History and Physical, chart, labs and discussed the procedure including the risks, benefits and alternatives for the proposed anesthesia with the patient or authorized representative who has indicated his/her understanding and acceptance.   Dental Advisory Given  Plan Discussed with: CRNA  Anesthesia Plan Comments:         Anesthesia Quick Evaluation

## 2018-02-19 NOTE — Telephone Encounter (Signed)
Called home health again, [facility] nurse states that Dr. Rudene Christians actually ordered those labs and that pt has been admitted again to hospital.

## 2018-02-19 NOTE — Evaluation (Signed)
Occupational Therapy Evaluation Patient Details Name: Julie Holland MRN: [id number] DOB: 1940/11/16 Today's Date: 02/19/2018    History of Present Illness 77 y.o.femalewith a known history of arthritis, perianal carcinoma in situ, CKD stage I, diabetes mellitus type 2, GERD, glaucoma, hemorrhoids, hyperlipidemia, HTN. Presented to ED with GI bleed. Was hospitalized 1 week ago with pain and swelling in the right wrist.Patient had a fall at home secondary to loss of balance 2 weeks ago.She fell on her outstretched right hand; imaging studies of the right upper extremity no fractures.Patient has redness and swelling around the right wrist and was tender to touch. Found to have abscess. incision and drainage completed 1 week ago. Patient was to start outpatient OT, but was rehospitalized prior to evaluation.    Clinical Impression   Patient was supine in bed when OT arrived. Patient was pleasant and willing to work with OT. Recalled meeting with OT last hospitalization. States she was back due to needing transfusion, but now that she received it she was feeling much better. Patient states she was supposed to go to OT evaluation, but due to rehospitalization was not able to attend evaluation. Continues to have redness and swelling in right hand. Mostly redness near CMC of thumb, with some redness over joints on dorsal side of hand. Edema present throughout right hand. Patient states skin was very dry and starting to peel, but nursing provided her with cream. Educated patient on gentle composite flexion and extension exercises of right fingers. Patient able to perform 10 x 2 with verbal and visual cues. Patient able to range digits through only half of total range. Educated on edema management techniques. Patient verbalized and demonstrated understanding. Unable to perform any ROM on wrist due to wrapping.    Follow Up Recommendations  Home health OT    Equipment Recommendations        Recommendations for Other Services       Precautions / Restrictions Precautions Precautions: Fall Required Braces or Orthoses: Other Brace/Splint Other Brace/Splint: right wrist/hand wrapped Restrictions Weight Bearing Restrictions: No      Mobility Bed Mobility               General bed mobility comments: min assistance including cuing for sequencing to increase safety  Transfers                      Balance                                           ADL either performed or assessed with clinical judgement   ADL Overall ADL's : Needs assistance/impaired Eating/Feeding: Sitting;Set up Eating/Feeding Details (indicate cue type and reason): set up for opening packets/containers 2:2 impaired RUE use for bilat tasks Grooming: Sitting;Set up   Upper Body Bathing: Sitting;Minimal assistance;With caregiver independent assisting Upper Body Bathing Details (indicate cue type and reason): instructed in seated sponge bath, caregiver able to assist Lower Body Bathing: Sitting/lateral leans;Minimal assistance;Moderate assistance;With caregiver independent assisting Lower Body Bathing Details (indicate cue type and reason): instructed in seated sponge bath, caregiver able to assist Upper Body Dressing : Sitting;Minimal assistance;With caregiver independent assisting   Lower Body Dressing: Sit to/from stand;Minimal assistance;Moderate assistance;With caregiver independent assisting   Toilet Transfer: Stand-pivot;Moderate assistance;BSC                   Vision Baseline Vision/History:  Wears glasses;Glaucoma Wears Glasses: Reading only Patient Visual Report: No change from baseline Vision Assessment?: No apparent visual deficits     Perception     Praxis      Pertinent Vitals/Pain Pain Assessment: 0-10 Pain Score: 2  Pain Location: R hand Pain Descriptors / Indicators: Aching Pain Intervention(s): Limited activity within patient's  tolerance;Monitored during session     Hand Dominance Left   Extremity/Trunk Assessment Upper Extremity Assessment Upper Extremity Assessment: RUE deficits/detail RUE Deficits / Details: unable to assess wrist. Digits grossly able to perform 1/2 range due to edema and pain. forearm, elbow, shoulder WFL RUE: Unable to fully assess due to pain;Unable to fully assess due to immobilization           Communication Communication Communication: No difficulties   Cognition Arousal/Alertness: Awake/alert Behavior During Therapy: WFL for tasks assessed/performed Overall Cognitive Status: Within Functional Limits for tasks assessed                                     General Comments       Exercises Hand Exercises Digit Composite Flexion: AROM;10 reps;Right Composite Extension: AROM;Right;10 reps   Shoulder Instructions      Home Living Family/patient expects to be discharged to:: Private residence Living Arrangements: Spouse/significant other Available Help at Discharge: Family;Available 24 hours/day Type of Home: House Home Access: Stairs to enter [employer] of Steps: 3 Entrance Stairs-Rails: Left Home Layout: One level     Bathroom Shower/Tub: Occupational psychologist: Standard     Home Equipment: Environmental consultant - 2 wheels;Cane - single point          Prior Functioning/Environment Level of Independence: Independent with assistive device(s)        Comments: Pt amb w/ [facility] primarily but has 2WW at home. history of 2 falls        OT Problem List: Decreased strength;Decreased knowledge of use of DME or AE;Decreased range of motion;Impaired UE functional use;Pain;Decreased activity tolerance;Impaired balance (sitting and/or standing)      OT Treatment/Interventions: Self-care/ADL training;Balance training;Therapeutic exercise;Therapeutic activities;DME and/or AE instruction;Patient/family education    OT Goals(Current goals can be  found in the care plan section) Acute Rehab OT Goals Patient Stated Goal: to get better and remain as independent as possible OT Goal Formulation: With patient Time For Goal Achievement: 03/05/18 Potential to Achieve Goals: Good  OT Frequency: Min 2X/week   Barriers to D/C:            Co-evaluation              AM-PAC PT "6 Clicks" Daily Activity     Outcome Measure Help from another person eating meals?: A Little Help from another person taking care of personal grooming?: A Little Help from another person toileting, which includes using toliet, bedpan, or urinal?: A Little Help from another person bathing (including washing, rinsing, drying)?: A Little Help from another person to put on and taking off regular upper body clothing?: A Little Help from another person to put on and taking off regular lower body clothing?: A Little 6 Click Score: 18   End of Session Equipment Utilized During Treatment: Gait belt  Activity Tolerance: Patient tolerated treatment well Patient left: in bed;with call bell/phone within reach;with bed alarm set  OT Visit Diagnosis: Unsteadiness on feet (R26.81);Repeated falls (R29.6);Muscle weakness (generalized) (M62.81);Pain Pain - Right/Left: Right Pain - part of body: Hand  Time: 0235-0300 OT Time Calculation (min): 25 min Charges:  OT General Charges $OT Visit: 1 Visit OT Evaluation $OT Eval Low Complexity: 1 Low OT Treatments $Self Care/Home Management : 8-22 mins $Therapeutic Exercise: 8-22 mins G-Codes:     Amie [place], OTR/L  Damon Baisch L 02/19/2018, 3:58 PM

## 2018-02-20 LAB — TYPE AND SCREEN
ABO/RH(D): A POS
Antibody Screen: NEGATIVE
Unit division: 0
Unit division: 0
Unit division: 0

## 2018-02-20 LAB — BPAM RBC
Blood Product Expiration Date: 201906182359
Blood Product Expiration Date: 201906182359
Blood Product Expiration Date: 201907072359
ISSUE DATE / TIME: 201906131841
ISSUE DATE / TIME: 201906140017
ISSUE DATE / TIME: 201906141502
Unit Type and Rh: 5100
Unit Type and Rh: 600
Unit Type and Rh: 600

## 2018-02-20 LAB — GLUCOSE, CAPILLARY
Glucose-Capillary: 115 mg/dL — ABNORMAL HIGH (ref 65–99)
Glucose-Capillary: 122 mg/dL — ABNORMAL HIGH (ref 65–99)
Glucose-Capillary: 162 mg/dL — ABNORMAL HIGH (ref 65–99)

## 2018-02-20 LAB — HAPTOGLOBIN: Haptoglobin: 247 mg/dL — ABNORMAL HIGH (ref 34–200)

## 2018-02-20 LAB — CBC
HCT: 25.8 % — ABNORMAL LOW (ref 35.0–47.0)
Hemoglobin: 8.8 g/dL — ABNORMAL LOW (ref 12.0–16.0)
MCH: 30 pg (ref 26.0–34.0)
MCHC: 34.1 g/dL (ref 32.0–36.0)
MCV: 87.8 fL (ref 80.0–100.0)
Platelets: 201 10*3/uL (ref 150–440)
RBC: 2.94 MIL/uL — ABNORMAL LOW (ref 3.80–5.20)
RDW: 16.2 % — ABNORMAL HIGH (ref 11.5–14.5)
WBC: 15.7 10*3/uL — ABNORMAL HIGH (ref 3.6–11.0)

## 2018-02-20 MED ORDER — PANTOPRAZOLE SODIUM 40 MG PO TBEC: 40.0000 mg | DELAYED_RELEASE_TABLET | Freq: Two times a day (BID) | ORAL | 0 refills | Status: AC

## 2018-02-20 NOTE — Progress Notes (Addendum)
Patient discharging home. Instructions and prescription given to patient and husband, verbalized understanding. Husband transporting patient home. Patient wheeled to front entrance via wheelchair.

## 2018-02-20 NOTE — Care Management (Signed)
Patient to be discharged per MD order. Pt to return home and resume HHPT services with administration of IV abx. Resumption orders placed. Jermaine from [place] notified. RNCM to sign off.  Ines Bloomer RN BSN RNCM [phone]

## 2018-02-20 NOTE — Progress Notes (Signed)
Patient resting in bed, husband at bedside. Patient wants to be discharged home today. No complaints or signs of distress. Dressing on right hand clean, dry, and intact. Continue to monitor.

## 2018-02-20 NOTE — Discharge Summary (Addendum)
[facility] NAME: Julie Holland    MR#:  [id number]  DATE OF BIRTH:  [birth year]  DATE OF ADMISSION:  02/18/2018 ADMITTING PHYSICIAN: Nicholes Mango, MD  DATE OF DISCHARGE: No discharge date for patient encounter.  PRIMARY CARE PHYSICIAN: [name], MD    ADMISSION DIAGNOSIS:  Gastrointestinal hemorrhage, unspecified gastrointestinal hemorrhage type [K92.2] Iron deficiency anemia, unspecified iron deficiency anemia type [D50.9]  DISCHARGE DIAGNOSIS:  Active Problems:   GI bleed   PUD (peptic ulcer disease)   SECONDARY DIAGNOSIS:   Past Medical History:  Diagnosis Date  . Arthritis   . Asthma   . 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  . Cataract   . Chronic kidney disease    stage 1  . Diabetes mellitus    type II  . Dry eye of right side   . GERD (gastroesophageal reflux disease)    OCC  . Glaucoma 2018   RIGHT EYE   . Hemorrhoids   . Hyperlipidemia   . Hypertension     HOSPITAL COURSE:  EdithCaudillis a21 y.o.femalewith a known history of chronic kidney disease stage I, diabetes mellitus, hypertension, hyperlipidemia was recently admitted to the hospital with right wrist fracture repaired by Dr. Rudene Christians and eventually had osteomyelitis discharged home with IV Ancef 2 g every 8 hours her routine lab work has revealed hemoglobin 5.5.And patient is sent over to the emergency department for abnormal labs. Stool for occult blood is positive in the ED. Patient is a symptomatic other than tiredness and fatigue.  *Acute symptomatic anemia secondary to GIB Resolved Improved status post 2 unit packed red blood cell transfusion-hemoglobin 8.8 on day of discharge, up from 5 Treated with Protonix drip, IV fluids for rehydration, gastroenterology did see patient while in house-underwent EGD noted for gastric ulcers which were nonbleeding/noted  gastritis/status post biopsies/recommended Protonix twice daily for 15-monthcourse with follow-up in 4 weeks status post discharge, avoided NSAIDs/anticoagulants/antiplatelet agents, and patient did well  *Acute rectal bleeding Most likely secondary to above Resolved Noted history of hemorrhoids in the past which were treated, EGD report as stated above Plan of care as stated above  *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 MRudene Christiansdid see patient while in-house-recommended follow-up on Monday for wound dressing change/reevaluation  Patient to resume outpatient home health services, Ancef 2 g IV every 8 hours-to be discontinued on March 25, 2018, CMP with differential/BMP every week/ESR/CRP with results sent to primary care provider  *Acute hyponatremia/hypochloremia Resolved with IV fluids for rehydration   *Chronic benign essential hypertension  stable Continue Coreg Held lisinopril while in house-we will restart on day of discharge  *Chronic hyperlipidemia, unspecified Stable Continue Zocor  *Chronic diabetes mellitus type 2 Stable on current regiment  DISCHARGE CONDITIONS:   stable  CONSULTS OBTAINED:  Treatment Team:  HDereck Leep MD  DRUG ALLERGIES:   Allergies  Allergen Reactions  . Hydrocodone Nausea And Vomiting and Other (See Comments)    Vertigo but patient takes hydrocodone/acetaminophen outpatient    DISCHARGE MEDICATIONS:   Allergies as of 02/20/2018      Reactions   Hydrocodone Nausea And Vomiting, Other (See Comments)   Vertigo but patient takes hydrocodone/acetaminophen outpatient      Medication List    STOP taking these medications  aspirin 81 MG chewable tablet     TAKE these medications   ACCU-CHEK SMARTVIEW test strip Generic drug:  glucose blood USE AS INSTRUCTED TO CHECK  BLOOD GLUCOSE  ONCE DAILY if desired; LON 99 months; Dx E11.9   acetaminophen 500 MG tablet Commonly known as:  TYLENOL Take 500-1,000 mg by mouth every 6 (six) hours as needed (for pain).   ADVAIR DISKUS 100-50 MCG/DOSE Aepb Generic drug:  Fluticasone-Salmeterol USE 1 PUFF TWO TIMES DAILY   albuterol 108 (90 Base) MCG/ACT inhaler Commonly known as:  PROVENTIL HFA;VENTOLIN HFA Inhale 2 puffs into the lungs every 6 (six) hours as needed for wheezing or shortness of breath.   butalbital-acetaminophen-caffeine 50-325-40 MG tablet Commonly known as:  FIORICET, ESGIC Take 1-2 tablets by mouth every 6 (six) hours as needed for headache.   carvedilol 6.25 MG tablet Commonly known as:  COREG Take 1 tablet (6.25 mg total) by mouth 2 (two) times daily with a meal.   ceFAZolin IVPB Commonly known as:  ANCEF Inject 2 g into the vein every 8 (eight) hours. Indication: Osteomyelitis  Last Day of Therapy: 03/25/18 Labs - Once weekly:  CBC/D and BMP, Labs - Every other week:  ESR and CRP   ESTROVEN PO Take 1 tablet by mouth daily.   latanoprost 0.005 % ophthalmic solution Commonly known as:  XALATAN Place 1 drop at bedtime into the right eye.   lisinopril 10 MG tablet Commonly known as:  PRINIVIL,ZESTRIL Take 1 tablet (10 mg total) by mouth daily.   metFORMIN 850 MG tablet Commonly known as:  GLUCOPHAGE Take 1 tablet (850 mg total) by mouth 2 (two) times daily with a meal.   multivitamin tablet Take 2 tablets by mouth daily.   oxyCODONE-acetaminophen 5-325 MG tablet Commonly known as:  PERCOCET/ROXICET Take 1 tablet by mouth every 6 (six) hours as needed for moderate pain or severe pain.   pantoprazole 40 MG tablet Commonly known as:  PROTONIX Take 1 tablet (40 mg total) by mouth 2 (two) times daily before a meal.   REFRESH OPTIVE ADVANCED OP Place 1 drop into both eyes 3 (three) times daily as needed (for dry/irritated eyes).   simvastatin 20 MG tablet Commonly known as:  ZOCOR TAKE 1  TABLET BY MOUTH AT  BEDTIME        DISCHARGE INSTRUCTIONS:   If you experience worsening of your admission symptoms, develop shortness of breath, life threatening emergency, suicidal or homicidal thoughts you must seek medical attention immediately by calling 911 or calling your MD immediately  if symptoms less severe.  You Must read complete instructions/literature along with all the possible adverse reactions/side effects for all the Medicines you take and that have been prescribed to you. Take any new Medicines after you have completely understood and accept all the possible adverse reactions/side effects.   Please note  You were cared for by a hospitalist during your hospital stay. If you have any questions about your discharge medications or the care you received while you were in the hospital after you are discharged, you can call the unit and asked to speak with the hospitalist on call if the hospitalist that took care of you is not available. Once you are discharged, your primary care physician will handle any further medical issues. Please note that NO REFILLS for any discharge medications will be authorized once you are discharged, as it is imperative that you return to your primary care physician (or establish a relationship with a primary care  physician if you do not have one) for your aftercare needs so that they can reassess your need for medications and monitor your lab values.    Today   CHIEF COMPLAINT:   Chief Complaint  Patient presents with  . Anemia    HISTORY OF PRESENT ILLNESS:  77 y.o. female with a known history of chronic kidney disease stage I, diabetes mellitus, hypertension, hyperlipidemia was recently admitted to the hospital with right hand abscess drained by Dr. Rudene Christians and eventually had osteomyelitis discharged home with IV Ancef 2 g every 8 hours her routine lab work has revealed hemoglobin 5.5.  And patient is sent over to the emergency department for  abnormal labs.  Stool for occult blood is positive in the ED.  Patient is a symptomatic other than tiredness and fatigue.  Denies any chest pain or shortness of breath.  Son at bedside.  2 units of blood transfusion was ordered in the emergency department by the emergency department physician  VITAL SIGNS:  Blood pressure (!) 186/98, pulse 88, temperature 98.3 F (36.8 C), temperature source Oral, resp. rate 16, height _0  (1.651 m), weight 53.5 kg (118 lb), SpO2 94 %.  I/O:    Intake/Output Summary (Last 24 hours) at 02/20/2018 1021 Last data filed at 02/20/2018 0759 Gross per 24 hour  Intake 400 ml  Output 2600 ml  Net -2200 ml    PHYSICAL EXAMINATION:  GENERAL:  77 y.o.-year-old patient lying in the bed with no acute distress.  EYES: Pupils equal, round, reactive to light and accommodation. No scleral icterus. Extraocular muscles intact.  HEENT: Head atraumatic, normocephalic. Oropharynx and nasopharynx clear.  NECK:  Supple, no jugular venous distention. No thyroid enlargement, no tenderness.  LUNGS: Normal breath sounds bilaterally, no wheezing, rales,rhonchi or crepitation. No use of accessory muscles of respiration.  CARDIOVASCULAR: S1, S2 normal. No murmurs, rubs, or gallops.  ABDOMEN: Soft, non-tender, non-distended. Bowel sounds present. No organomegaly or mass.  EXTREMITIES: No pedal edema, cyanosis, or clubbing.  NEUROLOGIC: Cranial nerves II through XII are intact. Muscle strength 5/5 in all extremities. Sensation intact. Gait not checked.  PSYCHIATRIC: The patient is alert and oriented x 3.  SKIN: No obvious rash, lesion, or ulcer.   DATA REVIEW:   CBC Recent Labs  Lab 02/20/18 0852  WBC 15.7*  HGB 8.8*  HCT 25.8*  PLT 201    Chemistries  Recent Labs  Lab 02/19/18 0500  NA 134*  K 3.6  CL 95*  CO2 30  GLUCOSE 127*  BUN 11  CREATININE 0.40*  CALCIUM 8.1*  AST 28  ALT 9*  ALKPHOS 85  BILITOT 1.3*    Cardiac Enzymes No results for input(s):  TROPONINI in the last 168 hours.  Microbiology Results  Results for orders placed or performed during the hospital encounter of 02/10/18  Blood culture (routine x 2)     Status: Abnormal   Collection Time: 02/10/18 11:22 AM  Result Value Ref Range Status   Specimen Description   Final    BLOOD BLOOD LEFT FOREARM Performed at [facility], [address], [place], [place] [address]    Special Requests   Final    BOTTLES DRAWN AEROBIC AND ANAEROBIC Blood Culture results may not be optimal due to an excessive volume of blood received in culture bottles Performed at [facility], [address] [place], [place] [address]    Culture  Setup Time   Final    GRAM POSITIVE COCCI IN BOTH  AEROBIC AND ANAEROBIC BOTTLES CRITICAL VALUE NOTED.  VALUE IS CONSISTENT WITH PREVIOUSLY REPORTED AND CALLED VALUE. Performed at [facility], [address] [place], [place] [address]    Culture (A)  Final    STAPHYLOCOCCUS AUREUS SUSCEPTIBILITIES PERFORMED ON PREVIOUS CULTURE WITHIN THE LAST 5 DAYS. Performed at [facility], [place] [address] [place], [place] [address]    Report Status 02/13/2018 FINAL  Final  Blood culture (routine x 2)     Status: Abnormal   Collection Time: 02/10/18 11:22 AM  Result Value Ref Range Status   Specimen Description   Final    BLOOD RIGHT ANTECUBITAL Performed at [facility], [address] [place], [place] [address]    Special Requests   Final    BOTTLES DRAWN AEROBIC AND ANAEROBIC Blood Culture results may not be optimal due to an inadequate volume of blood received in culture bottles Performed at [facility], [address], [place], [place] [address]    Culture  Setup Time   Final    GRAM POSITIVE COCCI IN BOTH AEROBIC AND ANAEROBIC BOTTLES CRITICAL RESULT CALLED TO, READ BACK BY AND VERIFIED WITH: DAVID BESANTI ON 02/11/18 AT 0117 JAG Performed at [facility], [place] [address] [place],   [address]    Culture STAPHYLOCOCCUS AUREUS (A)  Final   Report Status 02/13/2018 FINAL  Final   Organism ID, Bacteria STAPHYLOCOCCUS AUREUS  Final      Susceptibility   Staphylococcus aureus - MIC*    CIPROFLOXACIN <=0.5 SENSITIVE Sensitive     ERYTHROMYCIN <=0.25 SENSITIVE Sensitive     GENTAMICIN <=0.5 SENSITIVE Sensitive     OXACILLIN <=0.25 SENSITIVE Sensitive     TETRACYCLINE <=1 SENSITIVE Sensitive     VANCOMYCIN 1 SENSITIVE Sensitive     TRIMETH/SULFA <=10 SENSITIVE Sensitive     CLINDAMYCIN <=0.25 SENSITIVE Sensitive     RIFAMPIN <=0.5 SENSITIVE Sensitive     Inducible Clindamycin NEGATIVE Sensitive     * STAPHYLOCOCCUS AUREUS  Blood Culture ID Panel (Reflexed)     Status: Abnormal   Collection Time: 02/10/18 11:22 AM  Result Value Ref Range Status   Enterococcus species NOT DETECTED NOT DETECTED Final   Listeria monocytogenes NOT DETECTED NOT DETECTED Final   Staphylococcus species DETECTED (A) NOT DETECTED Final    Comment: CRITICAL RESULT CALLED TO, READ BACK BY AND VERIFIED WITH: DAVID BESANTI ON 02/11/18 AT 0117 JAG    Staphylococcus aureus DETECTED (A) NOT DETECTED Final    Comment: Methicillin (oxacillin) susceptible Staphylococcus aureus (MSSA). Preferred therapy is anti staphylococcal beta lactam antibiotic (Cefazolin or Nafcillin), unless clinically contraindicated. CRITICAL RESULT CALLED TO, READ BACK BY AND VERIFIED WITH: DAVID BESANTI ON 02/11/18 AT 0117  JAG    Methicillin resistance NOT DETECTED NOT DETECTED Final   Streptococcus species NOT DETECTED NOT DETECTED Final   Streptococcus agalactiae NOT DETECTED NOT DETECTED Final   Streptococcus pneumoniae NOT DETECTED NOT DETECTED Final   Streptococcus pyogenes NOT DETECTED NOT DETECTED Final   Acinetobacter baumannii NOT DETECTED NOT DETECTED Final   Enterobacteriaceae species NOT DETECTED NOT DETECTED Final   Enterobacter cloacae complex NOT DETECTED NOT DETECTED Final   Escherichia coli NOT DETECTED NOT  DETECTED Final   Klebsiella oxytoca NOT DETECTED NOT DETECTED Final   Klebsiella pneumoniae NOT DETECTED NOT DETECTED Final   Proteus species NOT DETECTED NOT DETECTED Final   Serratia marcescens NOT DETECTED NOT DETECTED Final   Haemophilus influenzae NOT DETECTED NOT DETECTED Final  Neisseria meningitidis NOT DETECTED NOT DETECTED Final   Pseudomonas aeruginosa NOT DETECTED NOT DETECTED Final   Candida albicans NOT DETECTED NOT DETECTED Final   Candida glabrata NOT DETECTED NOT DETECTED Final   Candida krusei NOT DETECTED NOT DETECTED Final   Candida parapsilosis NOT DETECTED NOT DETECTED Final   Candida tropicalis NOT DETECTED NOT DETECTED Final    Comment: Performed at [facility], [address] [place], [place] [address]  Culture, blood (single) w Reflex to ID Panel     Status: None   Collection Time: 02/11/18 10:21 AM  Result Value Ref Range Status   Specimen Description BLOOD LEFT ANTECUBITAL  Final   Special Requests   Final    BOTTLES DRAWN AEROBIC AND ANAEROBIC Blood Culture adequate volume   Culture   Final    NO GROWTH 5 DAYS Performed at [facility], [address], [place], [place] [address]    Report Status 02/16/2018 FINAL  Final  Aerobic/Anaerobic Culture (surgical/deep wound)     Status: None   Collection Time: 02/11/18  7:55 PM  Result Value Ref Range Status   Specimen Description   Final    HAND RIGHT HAND ABCESS Performed at [facility], [address] [place], [place] [address]    Special Requests   Final    NONE Performed at [facility], [address] [place], [place] [address]    Gram Stain   Final    RARE WBC PRESENT, PREDOMINANTLY PMN FEW GRAM POSITIVE COCCI IN PAIRS    Culture   Final    ABUNDANT STAPHYLOCOCCUS AUREUS NO ANAEROBES ISOLATED Performed at [facility], [place] [address], [place], [place] [address]    Report Status 02/17/2018 FINAL  Final   Organism ID, Bacteria STAPHYLOCOCCUS  AUREUS  Final      Susceptibility   Staphylococcus aureus - MIC*    CIPROFLOXACIN <=0.5 SENSITIVE Sensitive     ERYTHROMYCIN <=0.25 SENSITIVE Sensitive     GENTAMICIN <=0.5 SENSITIVE Sensitive     OXACILLIN <=0.25 SENSITIVE Sensitive     TETRACYCLINE <=1 SENSITIVE Sensitive     VANCOMYCIN <=0.5 SENSITIVE Sensitive     TRIMETH/SULFA <=10 SENSITIVE Sensitive     CLINDAMYCIN <=0.25 SENSITIVE Sensitive     RIFAMPIN <=0.5 SENSITIVE Sensitive     Inducible Clindamycin NEGATIVE Sensitive     * ABUNDANT STAPHYLOCOCCUS AUREUS  CULTURE, BLOOD (ROUTINE X 2) w Reflex to ID Panel     Status: None   Collection Time: 02/12/18 11:29 AM  Result Value Ref Range Status   Specimen Description BLOOD BLOOD LEFT HAND  Final   Special Requests   Final    BOTTLES DRAWN AEROBIC AND ANAEROBIC Blood Culture adequate volume   Culture   Final    NO GROWTH 5 DAYS Performed at [facility], [address] [place], [place] [address]    Report Status 02/17/2018 FINAL  Final  CULTURE, BLOOD (ROUTINE X 2) w Reflex to ID Panel     Status: None   Collection Time: 02/12/18 11:36 AM  Result Value Ref Range Status   Specimen Description BLOOD BLOOD LEFT FOREARM  Final   Special Requests   Final    BOTTLES DRAWN AEROBIC AND ANAEROBIC Blood Culture adequate volume   Culture   Final    NO GROWTH 5 DAYS Performed at [facility], [address] [place], [place] [address]    Report Status 02/17/2018 FINAL  Final  RADIOLOGY:  No results found.  EKG:   Orders placed or performed during the hospital encounter of 03/08/17  . ED EKG  . ED EKG  . EKG 12-Lead  . EKG 12-Lead      Management plans discussed with the patient, family and they are in agreement.  CODE STATUS:     Code Status Orders  (From admission, onward)        Start     Ordered   02/18/18 1623  Full code  Continuous     02/18/18 1622    Code Status History    Date Active Date Inactive Code Status Order ID Comments  User Context   02/10/2018 1413 02/13/2018 2024 Full Code [id number]  Saundra Shelling, MD Inpatient   07/31/2016 1649 08/01/2016 1454 Full Code [id number]  Robert Bellow, MD Inpatient   07/31/2016 0918 07/31/2016 1649 Full Code [id number]  Robert Bellow, MD Inpatient    Advance Directive Documentation     Most Recent Value  Type of Advance Directive  Healthcare Power of [place], Living will  Pre-existing out of facility DNR order (yellow form or pink MOST form)  -  "MOST" Form in Place?  -      TOTAL TIME TAKING CARE OF THIS PATIENT: 45 minutes.    Avel Peace Salary M.D on 02/20/2018 at 10:21 AM  Between 7am to 6pm - Pager - [phone]  After 6pm go to [email or web address] - password [email or web address] [facility]  Office  [phone]  CC: Primary care physician; [name], MD   Note: This dictation was prepared with Dragon dictation along with smaller phrase technology. Any transcriptional errors that result from this process are unintentional.

## 2018-02-22 ENCOUNTER — Telehealth: Payer: Self-pay

## 2018-02-22 DIAGNOSIS — L03113 Cellulitis of right upper limb: Secondary | ICD-10-CM | POA: Diagnosis not present

## 2018-02-22 DIAGNOSIS — Z452 Encounter for adjustment and management of vascular access device: Secondary | ICD-10-CM | POA: Diagnosis not present

## 2018-02-22 NOTE — Telephone Encounter (Signed)
See if she can come at [facility] on Tuesday 02/23/2018 Thank you

## 2018-02-22 NOTE — Telephone Encounter (Signed)
TOC #1. Called pt to f/u after d/c from [facility] on 02/20/18. At the time of this entry, pt has not yet scheduled a hosp f/u appt with Dr. Sanda Klein. Urgent message sent to [place] for scheduling purposes. Discharge planning includes the following:  - discontinue ASA - start Protonix - continue care with [facility] for nursing (PICC), qwk CMP w/ diff and BMP - results to be sent to PCP - f/u Dr. Rudene Christians today - f/u Dr. Sanda Klein in 5 days - f/u GI in 4 weeks - continue heart healthy diet LVM requesting returned call.

## 2018-02-22 NOTE — Telephone Encounter (Signed)
Please let us know where to schedule

## 2018-02-22 NOTE — Telephone Encounter (Signed)
Called [phone] left voice message asking pt to return call. Wanted to see if she can come in tomorrow at noon to see Dr Sanda Klein per Dr Sanda Klein request.

## 2018-02-23 ENCOUNTER — Telehealth: Payer: Self-pay

## 2018-02-23 ENCOUNTER — Ambulatory Visit: Payer: Medicare Other | Admitting: Occupational Therapy

## 2018-02-23 NOTE — Telephone Encounter (Signed)
Spoke with Ammie and the hospital wanted  Her to f/u in 5 days.  I called pt and encouraged appt on Thursday at 63

## 2018-02-23 NOTE — Telephone Encounter (Signed)
Let's try NOON on the the 20th (Thursday) Thank you

## 2018-02-23 NOTE — Telephone Encounter (Signed)
LVM for pt to call the office to confirm if 02/25/18 @ noon is ok for her to come in

## 2018-02-23 NOTE — Telephone Encounter (Signed)
Transition Care Management Follow-up Telephone Call  How have you been since you were released from the hospital? States she is feeling much better and is "getting stronger". Denies any abd pain, discolored stools, dizziness, fatigue, or dyspnea.  Do you understand why you were in the hospital? yes  Do you have a copy of your discharge instructions Yes Do you understand the discharge instructions? yes  Where were you discharged to? Home  Do you have support at home? Yes    Items Reviewed:  Medications obtained Yes  Medications reviewed: Yes  Dietary changes reviewed: yes  Home Health? Yes, Agency: [facility]  DME ordered at discharge obtained? No  Medical supplies: NA, PICC supplies already received after previous discharge    Functional Questionnaire:   Activities of Daily Living (ADLs):   She states they are independent in the following: ambulation, bathing and hygiene, feeding, continence, grooming, toileting and dressing, can take oral meds without need for assistance States they require assistance with the following: medication management for IV PICC  Any transportation issues/concerns?: no  Any patient concerns? no  Confirmed importance and date/time of follow-up visits scheduled with PCP: yes, although appt still has not been scheduled. Urgent message sent to [place] for scheduling purposes.  Confirm appointment scheduled with specialist? Yes  Confirmed with patient if condition begins to worsen call PCP or If it's emergency go to the ER.

## 2018-02-23 NOTE — Telephone Encounter (Signed)
Pt. Is asking if can come in to see Dr. Sanda Klein for [place]. F/U next week.  She has appts. This week.   Please call and let pt know what day and time next week.

## 2018-02-24 ENCOUNTER — Encounter: Payer: Self-pay | Admitting: Gastroenterology

## 2018-02-25 ENCOUNTER — Ambulatory Visit: Payer: Medicare Other | Admitting: Occupational Therapy

## 2018-02-25 ENCOUNTER — Inpatient Hospital Stay: Payer: Medicare Other | Admitting: Family Medicine

## 2018-02-25 DIAGNOSIS — N189 Chronic kidney disease, unspecified: Secondary | ICD-10-CM | POA: Diagnosis not present

## 2018-02-25 DIAGNOSIS — M869 Osteomyelitis, unspecified: Secondary | ICD-10-CM | POA: Diagnosis not present

## 2018-02-25 NOTE — Anesthesia Postprocedure Evaluation (Signed)
Anesthesia Post Note  Patient: Julie Holland  Procedure(s) Performed: ESOPHAGOGASTRODUODENOSCOPY (EGD) (N/A )  Anesthesia Type: General     Last Vitals:  Vitals:   02/20/18 0408 02/20/18 0807  BP: (!) 186/98   Pulse: 90 88  Resp: 16   Temp: 36.8 C   SpO2: 99% 94%    Last Pain:  Vitals:   02/20/18 0759  TempSrc:   PainSc: 3                  Molli Barrows

## 2018-02-26 ENCOUNTER — Encounter: Payer: Self-pay | Admitting: Family Medicine

## 2018-02-26 ENCOUNTER — Telehealth: Payer: Self-pay | Admitting: Infectious Diseases

## 2018-02-26 ENCOUNTER — Ambulatory Visit (INDEPENDENT_AMBULATORY_CARE_PROVIDER_SITE_OTHER): Payer: Medicare Other | Admitting: Family Medicine

## 2018-02-26 ENCOUNTER — Telehealth: Payer: Self-pay | Admitting: Family Medicine

## 2018-02-26 VITALS — BP 158/82 | HR 90 | Resp 16 | Ht 65.0 in | Wt 112.8 lb

## 2018-02-26 DIAGNOSIS — D62 Acute posthemorrhagic anemia: Secondary | ICD-10-CM

## 2018-02-26 DIAGNOSIS — Z09 Encounter for follow-up examination after completed treatment for conditions other than malignant neoplasm: Secondary | ICD-10-CM | POA: Diagnosis not present

## 2018-02-26 DIAGNOSIS — I10 Essential (primary) hypertension: Secondary | ICD-10-CM | POA: Diagnosis not present

## 2018-02-26 DIAGNOSIS — E876 Hypokalemia: Secondary | ICD-10-CM

## 2018-02-26 DIAGNOSIS — K922 Gastrointestinal hemorrhage, unspecified: Secondary | ICD-10-CM

## 2018-02-26 DIAGNOSIS — K91840 Postprocedural hemorrhage and hematoma of a digestive system organ or structure following a digestive system procedure: Secondary | ICD-10-CM | POA: Diagnosis not present

## 2018-02-26 DIAGNOSIS — L03113 Cellulitis of right upper limb: Secondary | ICD-10-CM | POA: Diagnosis not present

## 2018-02-26 LAB — CBC WITH DIFFERENTIAL/PLATELET
Basophils Absolute: 43 cells/uL (ref 0–200)
Basophils Relative: 0.7 %
Eosinophils Absolute: 110 cells/uL (ref 15–500)
Eosinophils Relative: 1.8 %
HCT: 28.2 % — ABNORMAL LOW (ref 35.0–45.0)
Hemoglobin: 9.1 g/dL — ABNORMAL LOW (ref 11.7–15.5)
Lymphs Abs: 982 cells/uL (ref 850–3900)
MCH: 28.9 pg (ref 27.0–33.0)
MCHC: 32.3 g/dL (ref 32.0–36.0)
MCV: 89.5 fL (ref 80.0–100.0)
MPV: 13.6 fL — ABNORMAL HIGH (ref 7.5–12.5)
Monocytes Relative: 20.3 %
Neutro Abs: 3727 cells/uL (ref 1500–7800)
Neutrophils Relative %: 61.1 %
Platelets: 139 10*3/uL — ABNORMAL LOW (ref 140–400)
RBC: 3.15 10*6/uL — ABNORMAL LOW (ref 3.80–5.10)
RDW: 15.4 % — ABNORMAL HIGH (ref 11.0–15.0)
Total Lymphocyte: 16.1 %
WBC mixed population: 1238 cells/uL — ABNORMAL HIGH (ref 200–950)
WBC: 6.1 10*3/uL (ref 3.8–10.8)

## 2018-02-26 LAB — IRON,TIBC AND FERRITIN PANEL
%SAT: 16 % (calc) (ref 16–45)
Ferritin: 151 ng/mL (ref 16–288)
Iron: 48 ug/dL (ref 45–160)
TIBC: 295 mcg/dL (calc) (ref 250–450)

## 2018-02-26 MED ORDER — FERROUS SULFATE 325 (65 FE) MG PO TABS: 325.0000 mg | ORAL_TABLET | Freq: Two times a day (BID) | ORAL | 1 refills | Status: DC

## 2018-02-26 MED ORDER — POTASSIUM CHLORIDE ER 10 MEQ PO TBCR: 10.0000 meq | EXTENDED_RELEASE_TABLET | Freq: Every day | ORAL | 0 refills | Status: DC

## 2018-02-26 NOTE — Telephone Encounter (Signed)
Copied from Elias-Fela Solis [phone]. Topic: Quick Communication - See Telephone Encounter >> Feb 26, 2018 11:24 AM Ahmed Prima L wrote: CRM for notification. See Telephone encounter for: 02/26/18.  Patient would like Dr Sanda Klein to know that she found out that the infectious disease doctor in [place] is the one that ordered the in home nurse care for her.

## 2018-02-26 NOTE — Assessment & Plan Note (Signed)
Check CBC today; if transfusion needed, will see if that can arranged through cancer center instead of having to go to the ER

## 2018-02-26 NOTE — Assessment & Plan Note (Signed)
PICC line, 4 weeks of antibiotic therapy; I called Dr. Theodore Demark office, wanted to make sure that he is getting the labs if he is indeed the ordering/responsible provider; also let his staff know husband's wish to end home health services, though near the end of the visit, he and wife had softened on this somewhat

## 2018-02-26 NOTE — Progress Notes (Signed)
BP (!) 158/82 (BP Location: Right Arm)   Pulse 90   Resp 16   Ht 5\' 5"  (1.651 m)   Wt 112 lb 12.8 oz (51.2 kg)   SpO2 97%   BMI 18.77 kg/m    Subjective:    Patient ID: Julie Holland, female    DOB: 1941/04/30, 77 y.o.   MRN: [id number]  HPI: Julie Holland is a 77 y.o. female  Chief Complaint  Patient presents with  . Hospitalization Follow-up    HPI Patient is here for hospital follow-up; last note reviewed  She fell onto outstretched hand, got cellulitis; saw ortho and ID in the hospital, also involved were Dr. Mike Gip (heme) and she had an echo as well  She has a PICC line, up since 5:30 am, rushed to get her; will be on IV until July 1st; no fevers since leaving the hospital; husband doesn't know who ordered the antibiotics and why they have to have home health; home health has drawn blood  Had stress related ulcers in the esophagus; NO MORE baby aspirin she was told; no abd pain, no blood in the stool; already got transfusion; she goes back to see GI in August  Her blood pressure was high last visit; lisinopril was increased; stressed lately; not adding salt; she does not want to add more medicine and wishes to take her medicine when she gets home (has not had her medicine today) and destress  HTN, up a little high from stress and not taking yet today  Functional Status Survey: Is the patient deaf or have difficulty hearing?: No Does the patient have difficulty seeing, even when wearing glasses/contacts?: No Does the patient have difficulty concentrating, remembering, or making decisions?: No Does the patient have difficulty walking or climbing stairs?: Yes Does the patient have difficulty dressing or bathing?: No Does the patient have difficulty doing errands alone such as visiting a doctor's office or shopping?: Yes   Fall Risk  02/26/2018 02/04/2018 12/10/2017 09/11/2017 06/11/2017  Falls in the past year? Yes No No No No  Number falls in past yr: 1 - - - -    Comment - - - - -  Injury with Fall? Yes - - - -  Risk for fall due to : - Impaired vision;History of fall(s);Impaired balance/gait - - -  Risk for fall due to: Comment - wears reading glasses; glaucoma; ambulates with cane - - -  Follow up Falls prevention discussed - - - -     Depression screen [facility] 2/9 02/04/2018 12/10/2017 09/11/2017 06/11/2017 03/09/2017  Decreased Interest 0 0 0 0 0  Down, Depressed, Hopeless 0 0 0 0 0  PHQ - 2 Score 0 0 0 0 0  Altered sleeping 0 - - - -  Tired, decreased energy 0 - - - -  Change in appetite 0 - - - -  Feeling bad or failure about yourself  0 - - - -  Trouble concentrating 0 - - - -  Moving slowly or fidgety/restless 0 - - - -  Suicidal thoughts 0 - - - -  PHQ-9 Score 0 - - - -  Difficult doing work/chores Not difficult at all - - - -    Relevant past medical, surgical, family and social history reviewed Past Medical History:  Diagnosis Date  . Arthritis   . Asthma   . 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  . Cataract   .  Chronic kidney disease    stage 1  . Diabetes mellitus    type II  . Dry eye of right side   . GERD (gastroesophageal reflux disease)    OCC  . Glaucoma 2018   RIGHT EYE   . Hemorrhoids   . Hyperlipidemia   . Hypertension    Past Surgical History:  Procedure Laterality Date  . CATARACT EXTRACTION, BILATERAL    . CHOLECYSTECTOMY    . COLONOSCOPY  02/13/13   Dr Bary Castilla  . EPIGASTRIC HERNIA REPAIR N/A 03/20/2015   Procedure: HERNIA REPAIR EPIGASTRIC ADULT;  Surgeon: Robert Bellow, MD;  Location: [facility] ORS;  Service: General;  Laterality: N/A;  . ESOPHAGOGASTRODUODENOSCOPY N/A 02/19/2018   Procedure: ESOPHAGOGASTRODUODENOSCOPY (EGD);  Surgeon: Lin Landsman, MD;  [facility] END[facility]COPY;  Service: Gastroenterology;  Laterality: N/A;  . HEMORRHOID SURGERY N/A 03/20/2015    FOCAL HIGH-GRADE SQUAMOUS INTRAEPITHELIAL LESION (HSIL, ANAL  /HEMORRHOIDECTOMY;   Robert Bellow, MD [facility] ORS;  : General;  Laterality: N/A;  . HERNIA REPAIR  July 2016   Epigastric hernia, primary repair  . INCISION AND DRAINAGE Right 02/11/2018   Procedure: INCISION AND DRAINAGE- RIGHT HAND;  Surgeon: Hessie Knows, MD;  Location: [facility] ORS;  Service: Orthopedics;  Laterality: Right;  . TONSILLECTOMY  age 42  . TOTAL ABDOMINAL HYSTERECTOMY  01/1989  . TUBAL LIGATION    . 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  Case has been presented at the [facility] tumor board. No indication for additional treatment outside of serial exams  Surgeon: Robert Bellow, MD;  Location: [facility] ORS;  Service: General;  Laterality: N/A;   Family History  Problem Relation Age of Onset  . Bladder Cancer Mother   . Colon cancer Father   . Lung cancer Brother   . Lymphoma Sister   . Breast cancer Neg Hx    Social History   Tobacco Use  . Smoking status: Never Smoker  . Smokeless tobacco: Never Used  . Tobacco comment: smoking cessation materials not required  Substance Use Topics  . Alcohol use: No  . Drug use: No    Interim medical history since last visit reviewed. Allergies and medications reviewed  Review of Systems Per HPI unless specifically indicated above     Objective:    BP (!) 158/82 (BP Location: Right Arm)   Pulse 90   Resp 16   Ht 5\' 5"  (1.651 m)   Wt 112 lb 12.8 oz (51.2 kg)   SpO2 97%   BMI 18.77 kg/m   Wt Readings from Last 3 Encounters:  02/26/18 112 lb 12.8 oz (51.2 kg)  02/18/18 118 lb (53.5 kg)  02/10/18 118 lb (53.5 kg)    Physical Exam  Constitutional: She appears well-developed and well-nourished.  HENT:  Mouth/Throat: Mucous membranes are normal.  Eyes: EOM are normal. No scleral icterus.  Conjunctival pallor  Cardiovascular: Normal rate and regular rhythm.  Pulmonary/Chest: Effort normal and breath sounds normal.  Skin: Bruising  (anterior legs) noted.     PICC line in the left arm; no proximal or surrounding erythema; RIGHT hand has swelling and erythema along the body of the hand, radial aspect worse; desquamation and peeling skin where swelling has resolved lateral aspect of the erythema; nodular erythema dime-shaped at site of drain on dorsum of RIGHT hand; bruising along both shins; nailbeds are pale  Psychiatric: She has a normal mood and  affect. Her mood appears not anxious. She does not exhibit a depressed mood.    Results for orders placed or performed during the hospital encounter of 02/18/18  Comprehensive metabolic panel  Result Value Ref Range   Sodium 132 (L) 135 - 145 mmol/L   Potassium 3.8 3.5 - 5.1 mmol/L   Chloride 95 (L) 101 - 111 mmol/L   CO2 27 22 - 32 mmol/L   Glucose, Bld 160 (H) 65 - 99 mg/dL   BUN 21 (H) 6 - 20 mg/dL   Creatinine, Ser 0.56 0.44 - 1.00 mg/dL   Calcium 8.3 (L) 8.9 - 10.3 mg/dL   Total Protein 6.8 6.5 - 8.1 g/dL   Albumin 2.6 (L) 3.5 - 5.0 g/dL   AST 29 15 - 41 U/L   ALT 12 (L) 14 - 54 U/L   Alkaline Phosphatase 90 38 - 126 U/L   Total Bilirubin 0.4 0.3 - 1.2 mg/dL   GFR calc non Af Amer >60 >60 mL/min   GFR calc Af Amer >60 >60 mL/min   Anion gap 10 5 - 15  CBC with Differential  Result Value Ref Range   WBC 19.3 (H) 3.6 - 11.0 K/uL   RBC 1.82 (L) 3.80 - 5.20 MIL/uL   Hemoglobin 5.2 (L) 12.0 - 16.0 g/dL   HCT 16.1 (L) 35.0 - 47.0 %   MCV 88.5 80.0 - 100.0 fL   MCH 28.9 26.0 - 34.0 pg   MCHC 32.6 32.0 - 36.0 g/dL   RDW 17.0 (H) 11.5 - 14.5 %   Platelets 221 150 - 440 K/uL   Neutrophils Relative % 57 %   Lymphocytes Relative 10 %   Monocytes Relative 11 %   Eosinophils Relative 5 %   Basophils Relative 2 %   Band Neutrophils 5 %   Metamyelocytes Relative 5 %   Myelocytes 3 %   Promyelocytes Relative 0 %   Blasts 0 %   nRBC 1 (H) 0 /100 WBC   Other 2 %   Neutro Abs 13.5 (H) 1.4 - 6.5 K/uL   Lymphs Abs 1.9 1.0 - 3.6 K/uL   Monocytes Absolute 2.1 (H) 0.2 -  0.9 K/uL   Eosinophils Absolute 1.0 (H) 0 - 0.7 K/uL   Basophils Absolute 0.4 (H) 0 - 0.1 K/uL   RBC Morphology RARE NRBCs    WBC Morphology MILD LEFT SHIFT (1-5% METAS, OCC MYELO, OCC BANDS)    Smear Review MORPHOLOGY UNREMARKABLE   Haptoglobin  Result Value Ref Range   Haptoglobin 244 (H) 34 - 200 mg/dL  Lactate dehydrogenase  Result Value Ref Range   LDH 264 (H) 98 - 192 U/L  Iron and TIBC  Result Value Ref Range   Iron 24 (L) 28 - 170 ug/dL   TIBC 263 250 - 450 ug/dL   Saturation Ratios 9 (L) 10.4 - 31.8 %   UIBC 239 ug/dL  Ferritin (Iron Binding Protein)  Result Value Ref Range   Ferritin 155 11 - 307 ng/mL  Vitamin B12  Result Value Ref Range   Vitamin B-12 746 180 - 914 pg/mL  Folate  Result Value Ref Range   Folate 23.0 >5.9 ng/mL  Pathologist smear review  Result Value Ref Range   Path Review      Blood smear and previous history reviewed. Platelets are adequate. There is critical anemia with some spherocytes and nucleated RBCs. No schistocytes are present. There is leukocytosis with increased and left shifted neutrophils including myelocytes.  Monocytes  and eosinophils are also increased. No blasts are seen. The RBC morphology raises concern for immune hemolysis as a component of the anemia. DAT is recommended, especially since hemolytic anemia can be associated with antibiotics. I note  previous history of chronic thrombocytopenia and mild monocytosis. Assessment of the CBC and blood smear is recommended when the patient has recovered from this acute event.    Hemoglobin and hematocrit, blood  Result Value Ref Range   Hemoglobin 5.0 (L) 12.0 - 16.0 g/dL   HCT 15.6 (L) 35.0 - 47.0 %  Hemoglobin and hematocrit, blood  Result Value Ref Range   Hemoglobin 7.8 (L) 12.0 - 16.0 g/dL   HCT 23.2 (L) 35.0 - 47.0 %  CBC  Result Value Ref Range   WBC 15.6 (H) 3.6 - 11.0 K/uL   RBC 2.61 (L) 3.80 - 5.20 MIL/uL   Hemoglobin 7.8 (L) 12.0 - 16.0 g/dL   HCT 22.6 (L) 35.0 -  47.0 %   MCV 86.6 80.0 - 100.0 fL   MCH 29.7 26.0 - 34.0 pg   MCHC 34.3 32.0 - 36.0 g/dL   RDW 15.6 (H) 11.5 - 14.5 %   Platelets 193 150 - 440 K/uL  Comprehensive metabolic panel  Result Value Ref Range   Sodium 134 (L) 135 - 145 mmol/L   Potassium 3.6 3.5 - 5.1 mmol/L   Chloride 95 (L) 101 - 111 mmol/L   CO2 30 22 - 32 mmol/L   Glucose, Bld 127 (H) 65 - 99 mg/dL   BUN 11 6 - 20 mg/dL   Creatinine, Ser 0.40 (L) 0.44 - 1.00 mg/dL   Calcium 8.1 (L) 8.9 - 10.3 mg/dL   Total Protein 6.6 6.5 - 8.1 g/dL   Albumin 2.5 (L) 3.5 - 5.0 g/dL   AST 28 15 - 41 U/L   ALT 9 (L) 14 - 54 U/L   Alkaline Phosphatase 85 38 - 126 U/L   Total Bilirubin 1.3 (H) 0.3 - 1.2 mg/dL   GFR calc non Af Amer >60 >60 mL/min   GFR calc Af Amer >60 >60 mL/min   Anion gap 9 5 - 15  Glucose, capillary  Result Value Ref Range   Glucose-Capillary 132 (H) 65 - 99 mg/dL   Comment 1 Notify RN   Glucose, capillary  Result Value Ref Range   Glucose-Capillary 126 (H) 65 - 99 mg/dL  Glucose, capillary  Result Value Ref Range   Glucose-Capillary 154 (H) 65 - 99 mg/dL  Glucose, capillary  Result Value Ref Range   Glucose-Capillary 114 (H) 65 - 99 mg/dL  Hemoglobin A1c  Result Value Ref Range   Hgb A1c MFr Bld 6.7 (H) 4.8 - 5.6 %   Mean Plasma Glucose 145.59 mg/dL  Glucose, capillary  Result Value Ref Range   Glucose-Capillary 116 (H) 65 - 99 mg/dL  Glucose, capillary  Result Value Ref Range   Glucose-Capillary 113 (H) 65 - 99 mg/dL   Comment 1 Notify RN   Glucose, capillary  Result Value Ref Range   Glucose-Capillary 122 (H) 65 - 99 mg/dL   Comment 1 Notify RN   Glucose, capillary  Result Value Ref Range   Glucose-Capillary 168 (H) 65 - 99 mg/dL  Glucose, capillary  Result Value Ref Range   Glucose-Capillary 174 (H) 65 - 99 mg/dL  Glucose, capillary  Result Value Ref Range   Glucose-Capillary 148 (H) 65 - 99 mg/dL  Haptoglobin  Result Value Ref Range   Haptoglobin 247 (H) 34 -  200 mg/dL  Glucose,  capillary  Result Value Ref Range   Glucose-Capillary 87 65 - 99 mg/dL  Glucose, capillary  Result Value Ref Range   Glucose-Capillary 83 65 - 99 mg/dL  Glucose, capillary  Result Value Ref Range   Glucose-Capillary 111 (H) 65 - 99 mg/dL  Glucose, capillary  Result Value Ref Range   Glucose-Capillary 162 (H) 65 - 99 mg/dL  Glucose, capillary  Result Value Ref Range   Glucose-Capillary 115 (H) 65 - 99 mg/dL  Glucose, capillary  Result Value Ref Range   Glucose-Capillary 122 (H) 65 - 99 mg/dL  CBC  Result Value Ref Range   WBC 15.7 (H) 3.6 - 11.0 K/uL   RBC 2.94 (L) 3.80 - 5.20 MIL/uL   Hemoglobin 8.8 (L) 12.0 - 16.0 g/dL   HCT 25.8 (L) 35.0 - 47.0 %   MCV 87.8 80.0 - 100.0 fL   MCH 30.0 26.0 - 34.0 pg   MCHC 34.1 32.0 - 36.0 g/dL   RDW 16.2 (H) 11.5 - 14.5 %   Platelets 201 150 - 440 K/uL  Type and screen [place]  Result Value Ref Range   ABO/RH(D) A POS    Antibody Screen NEG    Sample Expiration 02/21/2018    Unit Number [id number]    Blood Component Type RED CELLS,LR    Unit division 00    Status of Unit REL FROM [facility]    Transfusion Status OK TO TRANSFUSE    Crossmatch Result Compatible    Unit Number [id number]    Blood Component Type RED CELLS,LR    Unit division 00    Status of Unit ISSUED,FINAL    Transfusion Status OK TO TRANSFUSE    Crossmatch Result Compatible    Unit Number [id number]    Blood Component Type RED CELLS,LR    Unit division 00    Status of Unit ISSUED,FINAL    Transfusion Status OK TO TRANSFUSE    Crossmatch Result      Compatible Performed at [facility], [address] [place], [place] [address]   Prepare [facility]  Result Value Ref Range   Order Confirmation      ORDER PROCESSED BY BLOOD BANK Performed at [facility], [address] [place], [place] [address]   Surgical pathology  Result Value Ref Range   SURGICAL PATHOLOGY      Surgical Pathology CASE:  [id number] PATIENT: [place] [facility]ology Report     SPECIMEN SUBMITTED: A. Stomach, random, r/o h pylori; cbx  CLINICAL HISTORY: None provided  PRE-OPERATIVE DIAGNOSIS: Acute anemia  POST-OPERATIVE DIAGNOSIS: Gastric ulcers x2     DIAGNOSIS: A.  RANDOM STOMACH; COLD BIOPSY: - MODERATE CHRONIC GASTRITIS. - NEGATIVE FOR DYSPLASIA AND MALIGNANCY.  Note: Immunohistochemical stain for H. pylori is obtained and results will be issued in an addendum.   GROSS DESCRIPTION: A. Labeled: Random cbx stomach rule out H. pylori Received: In formalin Tissue fragment(s): 2 Size: 0.2 and 0.5 cm Description: [place] Cables fragments Entirely submitted in one cassette.    Final Diagnosis performed by Delorse Lek, MD.   Electronically signed 02/23/2018 1:08:35PM The electronic signature indicates that the named Attending Pathologist has evaluated the specimen  Technical component performed at [facility], [address] [place], [place]  [address] Lab: [phone] Dir: Rush Farmer, MD, MMM  Professional component performed at [facility], [facility], [place], [place], [place] [address] Lab: [phone] Dir: Dellia Nims. Reuel Derby, MD   BPAM RBC  Result Value Ref Range   ISSUE DATE / TIME 660630160109    Blood Product Unit Number [id number]    PRODUCT CODE [id number]    Unit Type and Rh 5100    Blood Product Expiration Date 270623762831    ISSUE DATE / TIME [id number]    Blood Product Unit Number [id number]    PRODUCT CODE [id number]    Unit Type and Rh 0600    Blood Product Expiration Date 299371696789    ISSUE DATE / TIME [id number]    Blood Product Unit Number [id number]    PRODUCT CODE [id number]    Unit Type and Rh 0600    Blood Product Expiration Date 867619509326       Assessment & Plan:   Problem List Items Addressed This Visit      Cardiovascular and Mediastinum   Hypertension    Patient does not want to add medicine; she'll  take her BP med when she gets home and try to destress        Digestive   GI bleed - Primary   Relevant Orders   CBC with Differential/Platelet   Fe+TIBC+Fer   Postoperative hemorrhage involving digestive system following digestive system procedure    Stress ulceration; continue PPI; avoid aspirin and NSAIDs; may use tylenol for pain; start iron supplementation, BID if tolerated, just daily if too constipating; notify me right away of any blood in stool or worsening symptoms; CBC today; explained if still dropping, may need transfusion        Other   Cellulitis    PICC line, 4 weeks of antibiotic therapy; I called Dr. Theodore Demark office, wanted to make sure that he is getting the labs if he is indeed the ordering/responsible provider; also let his staff know husband's wish to end home health services, though near the end of the visit, he and wife had softened on this somewhat      Anemia associated with acute blood loss    Check CBC today; if transfusion needed, will see if that can arranged through cancer center instead of having to go to the ER      Relevant Medications   ferrous sulfate 325 (65 FE) MG tablet    Other Visit Diagnoses    Cellulitis of right hand       Hypokalemia       sent just a few K+ pills in, and will recheck today; judicious since she is on ACE-I       Follow up plan: Return in about 1 month (around 03/26/2018).  An after-visit summary was printed and given to the patient at [facility].  Please see the patient instructions which may contain other information and recommendations beyond what is mentioned above in the assessment and plan.  Meds ordered this encounter  Medications  . ferrous sulfate 325 (65 FE) MG tablet    Sig: Take 1 tablet (325 mg total) by mouth 2 (two) times daily with a meal.    Dispense:  60 tablet    Refill:  1  . potassium chloride (KLOR-CON 10) 10 MEQ tablet    Sig: Take 1 tablet (10 mEq total) by mouth daily.    Dispense:  5  tablet    Refill:  0    Orders Placed This Encounter  Procedures  . CBC with Differential/Platelet  . Fe+TIBC+Fer

## 2018-02-26 NOTE — Assessment & Plan Note (Signed)
Patient does not want to add medicine; she'll take her BP med when she gets home and try to destress

## 2018-02-26 NOTE — Telephone Encounter (Signed)
Thank you :)

## 2018-02-26 NOTE — Patient Instructions (Addendum)
Please do start the iron, ferrous sulfate 324 mg or 325 mg twice a day for two months We'll get labs today If you have not heard anything from my staff in a week about any orders/referrals/studies from today, please contact us here to follow-up [phone] Destress and relax and don't let this run your blood pressure up Try to follow the DASH guidelines (DASH stands for Dietary Approaches to Stop Hypertension). Try to limit the sodium in your diet to no more than 1,500mg  of sodium per day. Certainly try to not exceed 2,000 mg per day at the very most. Do not add salt when cooking or at the table.  Check the sodium amount on labels when shopping, and choose items lower in sodium when given a choice. Avoid or limit foods that already contain a lot of sodium. Eat a diet rich in fruits and vegetables and whole grains, and try to lose weight if overweight or obese Watch your stools and let us know right away of any new blood or abdominal pain Do try to get in some Boost or Ensure and don't skip meals

## 2018-02-26 NOTE — Telephone Encounter (Signed)
Called by advance Husband upset she has been receiving too many supplies.  Felt to be very controlling by Advance.  sHe has f/u appt with me on 7-1 She was see by Dr Ola Spurr on 6-7 for MSSA bacteremia.

## 2018-02-26 NOTE — Assessment & Plan Note (Signed)
Stress ulceration; continue PPI; avoid aspirin and NSAIDs; may use tylenol for pain; start iron supplementation, BID if tolerated, just daily if too constipating; notify me right away of any blood in stool or worsening symptoms; CBC today; explained if still dropping, may need transfusion

## 2018-03-01 ENCOUNTER — Other Ambulatory Visit: Payer: Self-pay | Admitting: Family Medicine

## 2018-03-01 ENCOUNTER — Other Ambulatory Visit: Payer: Self-pay

## 2018-03-01 DIAGNOSIS — E785 Hyperlipidemia, unspecified: Secondary | ICD-10-CM

## 2018-03-01 DIAGNOSIS — L03113 Cellulitis of right upper limb: Secondary | ICD-10-CM | POA: Diagnosis not present

## 2018-03-01 DIAGNOSIS — Z452 Encounter for adjustment and management of vascular access device: Secondary | ICD-10-CM | POA: Diagnosis not present

## 2018-03-01 MED ORDER — SIMVASTATIN 20 MG PO TABS: 20.0000 mg | ORAL_TABLET | Freq: Every day | ORAL | 0 refills | Status: AC

## 2018-03-01 MED ORDER — FERROUS SULFATE 325 (65 FE) MG PO TABS: 325.0000 mg | ORAL_TABLET | Freq: Two times a day (BID) | ORAL | Status: DC

## 2018-03-01 MED ORDER — FERROUS SULFATE 325 (65 FE) MG PO TABS: 325.0000 mg | ORAL_TABLET | Freq: Every day | ORAL | Status: DC

## 2018-03-01 NOTE — Progress Notes (Signed)
Updated iron to be just once a day x 2 months

## 2018-03-01 NOTE — Telephone Encounter (Signed)
Last SGPT reviewed; back to normal; rx approved

## 2018-03-03 DIAGNOSIS — M869 Osteomyelitis, unspecified: Secondary | ICD-10-CM | POA: Diagnosis not present

## 2018-03-03 DIAGNOSIS — N189 Chronic kidney disease, unspecified: Secondary | ICD-10-CM | POA: Diagnosis not present

## 2018-03-05 ENCOUNTER — Inpatient Hospital Stay: Payer: Medicare Other | Admitting: Family Medicine

## 2018-03-08 ENCOUNTER — Encounter: Payer: Self-pay | Admitting: Infectious Diseases

## 2018-03-08 ENCOUNTER — Ambulatory Visit (INDEPENDENT_AMBULATORY_CARE_PROVIDER_SITE_OTHER): Payer: Medicare Other | Admitting: Infectious Diseases

## 2018-03-08 ENCOUNTER — Other Ambulatory Visit: Payer: Self-pay

## 2018-03-08 DIAGNOSIS — R7881 Bacteremia: Secondary | ICD-10-CM

## 2018-03-08 DIAGNOSIS — K922 Gastrointestinal hemorrhage, unspecified: Secondary | ICD-10-CM | POA: Diagnosis not present

## 2018-03-08 DIAGNOSIS — Z452 Encounter for adjustment and management of vascular access device: Secondary | ICD-10-CM | POA: Diagnosis not present

## 2018-03-08 DIAGNOSIS — L03113 Cellulitis of right upper limb: Secondary | ICD-10-CM | POA: Diagnosis not present

## 2018-03-08 DIAGNOSIS — L02511 Cutaneous abscess of right hand: Secondary | ICD-10-CM | POA: Insufficient documentation

## 2018-03-08 LAB — CBC AND DIFFERENTIAL
HCT: 30 — AB (ref 36–46)
Hemoglobin: 9.1 — AB (ref 12.0–16.0)
Platelets: 114 — AB (ref 150–399)
WBC: 4.4

## 2018-03-08 LAB — BASIC METABOLIC PANEL
BUN: 31 — AB (ref 4–21)
Creatinine: 0.6 (ref 0.5–1.1)
Glucose: 171
Potassium: 3.8 (ref 3.4–5.3)
Sodium: 132 — AB (ref 137–147)

## 2018-03-08 NOTE — Assessment & Plan Note (Signed)
She appears to be doing well.  Will repeat her BCx at end of therapy.

## 2018-03-08 NOTE — Progress Notes (Signed)
   Subjective:    Patient ID: Julie Holland, female    DOB: 14-Mar-1941, 77 y.o.   MRN: [id number]  HPI 77 yo F with DM2, CKD1, who was in [facility] 6-5 to 02-13-18.  1 week pta she had a fall with subsequent fracture of R wrist. She was found on MRI to have abscess of her R wrist. She underwent I & D and had BCx + MSSA. She has TTE (-). Her repeat BCx were (-) on 02-12-18. She was treated with ancef, with end date set at July 18.  She then returred to [facility] 02-18-18 to 02-20-18 with upper GI bleed. She had Hgb 5.5, received blood txf- 2u prbc. She underwent EGD which noted gastric ulcers. She was treated with protonix. Hgb 8/.8 on day of d/c.  She feels like her hand is better. She has not had problems with her PIC.  No f/c. noswelling in her hand.  She had hand surgery f/u last week- "he was amazed at how well I was doing". She has had improving motion/function of her hand.   FSG have been good.   The past medical history, family history and social history were reviewed/updated in EPIC   Review of Systems  Constitutional: Negative for appetite change, chills and fever.  Gastrointestinal: Negative for blood in stool, constipation, diarrhea and nausea.  Genitourinary: Negative for difficulty urinating.  Skin: Positive for wound.  Please see HPI. All other systems reviewed and negative.      Objective:   Physical Exam  Constitutional: She appears well-developed and well-nourished.  HENT:  Mouth/Throat: No oropharyngeal exudate.  Eyes: Pupils are equal, round, and reactive to light. EOM are normal.  Neck: Normal range of motion. Neck supple.  Cardiovascular: Normal rate, regular rhythm and normal heart sounds.  Pulmonary/Chest: Effort normal and breath sounds normal.  Abdominal: Soft. Bowel sounds are normal. There is no tenderness. There is no guarding.  Musculoskeletal:       Arms: Lymphadenopathy:    She has no cervical adenopathy.          Assessment & Plan:

## 2018-03-08 NOTE — Assessment & Plan Note (Addendum)
I am concerned about the continued redness, warmth, swelling of her hand.  She states her hand surgeon feels she is doing well.  Will re-assess at end of therapy and repeat her MRi at that time if she continues to have the above sx.  Will see her back in 2 weeks, at end of therapy.  ESR 95 on (6-11) CRP 79.8 (6-11) 17 (6-11)

## 2018-03-08 NOTE — Assessment & Plan Note (Signed)
She appears to be stable.  Will f/u her labs.  She is on iron.

## 2018-03-10 ENCOUNTER — Encounter: Payer: Self-pay | Admitting: Family Medicine

## 2018-03-10 ENCOUNTER — Other Ambulatory Visit: Payer: Self-pay

## 2018-03-10 DIAGNOSIS — N189 Chronic kidney disease, unspecified: Secondary | ICD-10-CM | POA: Diagnosis not present

## 2018-03-10 DIAGNOSIS — E119 Type 2 diabetes mellitus without complications: Secondary | ICD-10-CM

## 2018-03-10 MED ORDER — METFORMIN HCL 850 MG PO TABS: 850.0000 mg | ORAL_TABLET | Freq: Two times a day (BID) | ORAL | 0 refills | Status: DC

## 2018-03-10 NOTE — Telephone Encounter (Signed)
Refill request was sent to Dr. Melinda Lada for approval and submission.  

## 2018-03-12 ENCOUNTER — Encounter: Payer: Self-pay | Admitting: Family Medicine

## 2018-03-16 DIAGNOSIS — Z452 Encounter for adjustment and management of vascular access device: Secondary | ICD-10-CM | POA: Diagnosis not present

## 2018-03-16 DIAGNOSIS — L03113 Cellulitis of right upper limb: Secondary | ICD-10-CM | POA: Diagnosis not present

## 2018-03-16 DIAGNOSIS — K219 Gastro-esophageal reflux disease without esophagitis: Secondary | ICD-10-CM | POA: Diagnosis not present

## 2018-03-17 DIAGNOSIS — M8600 Acute hematogenous osteomyelitis, unspecified site: Secondary | ICD-10-CM | POA: Diagnosis not present

## 2018-03-17 DIAGNOSIS — N189 Chronic kidney disease, unspecified: Secondary | ICD-10-CM | POA: Diagnosis not present

## 2018-03-22 DIAGNOSIS — Z452 Encounter for adjustment and management of vascular access device: Secondary | ICD-10-CM | POA: Diagnosis not present

## 2018-03-22 DIAGNOSIS — L03113 Cellulitis of right upper limb: Secondary | ICD-10-CM | POA: Diagnosis not present

## 2018-03-22 DIAGNOSIS — K219 Gastro-esophageal reflux disease without esophagitis: Secondary | ICD-10-CM | POA: Diagnosis not present

## 2018-03-26 ENCOUNTER — Telehealth: Payer: Self-pay | Admitting: *Deleted

## 2018-03-26 ENCOUNTER — Encounter: Payer: Self-pay | Admitting: Family Medicine

## 2018-03-26 NOTE — Telephone Encounter (Signed)
Patient called to let Dr Johnnye Sima know that she has completed her antibiotics, but still has some itching.  She wondered how long that might last. RN attempted to return the call, but there was no answer, no voicemail. Patient follows up with Dr Johnnye Sima next week. Landis Gandy, RN

## 2018-03-29 DIAGNOSIS — L02413 Cutaneous abscess of right upper limb: Secondary | ICD-10-CM | POA: Diagnosis not present

## 2018-03-29 DIAGNOSIS — I129 Hypertensive chronic kidney disease with stage 1 through stage 4 chronic kidney disease, or unspecified chronic kidney disease: Secondary | ICD-10-CM | POA: Diagnosis not present

## 2018-03-29 DIAGNOSIS — Z7982 Long term (current) use of aspirin: Secondary | ICD-10-CM | POA: Diagnosis not present

## 2018-03-29 DIAGNOSIS — Z452 Encounter for adjustment and management of vascular access device: Secondary | ICD-10-CM | POA: Diagnosis not present

## 2018-03-29 DIAGNOSIS — Z7984 Long term (current) use of oral hypoglycemic drugs: Secondary | ICD-10-CM | POA: Diagnosis not present

## 2018-03-29 DIAGNOSIS — E1139 Type 2 diabetes mellitus with other diabetic ophthalmic complication: Secondary | ICD-10-CM | POA: Diagnosis not present

## 2018-03-29 DIAGNOSIS — E1122 Type 2 diabetes mellitus with diabetic chronic kidney disease: Secondary | ICD-10-CM | POA: Diagnosis not present

## 2018-03-29 DIAGNOSIS — Z9181 History of falling: Secondary | ICD-10-CM | POA: Diagnosis not present

## 2018-03-29 DIAGNOSIS — D696 Thrombocytopenia, unspecified: Secondary | ICD-10-CM | POA: Diagnosis not present

## 2018-03-29 DIAGNOSIS — E785 Hyperlipidemia, unspecified: Secondary | ICD-10-CM | POA: Diagnosis not present

## 2018-03-29 DIAGNOSIS — L03113 Cellulitis of right upper limb: Secondary | ICD-10-CM | POA: Diagnosis not present

## 2018-03-29 DIAGNOSIS — K219 Gastro-esophageal reflux disease without esophagitis: Secondary | ICD-10-CM | POA: Diagnosis not present

## 2018-03-29 DIAGNOSIS — Z85828 Personal history of other malignant neoplasm of skin: Secondary | ICD-10-CM | POA: Diagnosis not present

## 2018-03-29 DIAGNOSIS — H42 Glaucoma in diseases classified elsewhere: Secondary | ICD-10-CM | POA: Diagnosis not present

## 2018-03-29 DIAGNOSIS — N181 Chronic kidney disease, stage 1: Secondary | ICD-10-CM | POA: Diagnosis not present

## 2018-03-29 DIAGNOSIS — B9561 Methicillin susceptible Staphylococcus aureus infection as the cause of diseases classified elsewhere: Secondary | ICD-10-CM | POA: Diagnosis not present

## 2018-03-30 ENCOUNTER — Ambulatory Visit: Payer: Medicare Other | Admitting: Family Medicine

## 2018-03-31 ENCOUNTER — Encounter: Payer: Self-pay | Admitting: Infectious Diseases

## 2018-03-31 ENCOUNTER — Ambulatory Visit (INDEPENDENT_AMBULATORY_CARE_PROVIDER_SITE_OTHER): Payer: Medicare Other | Admitting: Infectious Diseases

## 2018-03-31 DIAGNOSIS — E0821 Diabetes mellitus due to underlying condition with diabetic nephropathy: Secondary | ICD-10-CM

## 2018-03-31 DIAGNOSIS — L02511 Cutaneous abscess of right hand: Secondary | ICD-10-CM | POA: Diagnosis not present

## 2018-03-31 MED ORDER — CEPHALEXIN 500 MG PO CAPS: 500.0000 mg | ORAL_CAPSULE | Freq: Two times a day (BID) | ORAL | 1 refills | Status: DC

## 2018-03-31 NOTE — Assessment & Plan Note (Addendum)
Will start her on po keflex for next 6 weeks.  Will see her back then.  I am still concerned about the erythema/warmth Her husband asks at length about what to expect in terms of treatment. I let him know that if she has f/c, worsening selling/pain/erythema, they should let us know immediately.

## 2018-03-31 NOTE — Assessment & Plan Note (Addendum)
States her FSG have been controlled She wants to start driving again. From an ID perspective I see no limitation on this. I encouraged her to wear her seat belt and stay under speed limit.

## 2018-03-31 NOTE — Progress Notes (Signed)
   Subjective:    Patient ID: Julie Holland, female    DOB: October 11, 1940, 77 y.o.   MRN: [id number]  HPI 77 yo F with DM2, CKD1, who was in [facility] 6-5 to 02-13-18.  1 week pta she had a fall with subsequent fracture of R wrist. She was found on MRI to have abscess of her R wrist. She underwent I & D and had BCx + MSSA. She has TTE (-). Her repeat BCx were (-) on 02-12-18. She was treated with ancef, with end date set at July 18.  She then returred to [facility] 02-18-18 to 02-20-18 with upper GI bleed. She had Hgb 5.5, received blood txf- 2u prbc. She underwent EGD which noted gastric ulcers. She was treated with protonix. Hgb 8/.8 on day of d/c.   She was seen in ID on 7-1 and had continued erythema, swelling.  She was continued on her IV ancef until 7-22.  She is excited today as the fxn in her hand is much better. She can close her hand now.  Her husband is concerned that the anbx via pic was causing her to be confused- forgetfullness.  Her FSG have been "very good".   Review of Systems  Constitutional: Negative for appetite change, chills and fever.  Gastrointestinal: Negative for constipation and diarrhea.  Genitourinary: Negative for difficulty urinating.  Musculoskeletal: Positive for joint swelling.  she denies yeast infections Please see HPI. All other systems reviewed and negative.      Objective:   Physical Exam  Constitutional: She appears well-developed and well-nourished.  HENT:  Mouth/Throat: No oropharyngeal exudate.  Eyes: Pupils are equal, round, and reactive to light. EOM are normal.  Neck: Normal range of motion. Neck supple.  Cardiovascular: Normal rate, regular rhythm and normal heart sounds.  Pulmonary/Chest: Effort normal and breath sounds normal.  Abdominal: Soft. Bowel sounds are normal. She exhibits no distension. There is no tenderness.  Musculoskeletal:       Arms: Lymphadenopathy:    She has no cervical adenopathy.          Assessment & Plan:

## 2018-04-06 ENCOUNTER — Encounter: Payer: Self-pay | Admitting: Family Medicine

## 2018-04-06 ENCOUNTER — Ambulatory Visit (INDEPENDENT_AMBULATORY_CARE_PROVIDER_SITE_OTHER): Payer: Medicare Other | Admitting: Family Medicine

## 2018-04-06 VITALS — BP 150/80 | HR 90 | Temp 98.1°F | Resp 16 | Ht 63.0 in | Wt 113.0 lb

## 2018-04-06 DIAGNOSIS — J4520 Mild intermittent asthma, uncomplicated: Secondary | ICD-10-CM

## 2018-04-06 DIAGNOSIS — J45909 Unspecified asthma, uncomplicated: Secondary | ICD-10-CM

## 2018-04-06 DIAGNOSIS — E782 Mixed hyperlipidemia: Secondary | ICD-10-CM

## 2018-04-06 DIAGNOSIS — D696 Thrombocytopenia, unspecified: Secondary | ICD-10-CM

## 2018-04-06 DIAGNOSIS — E2839 Other primary ovarian failure: Secondary | ICD-10-CM

## 2018-04-06 DIAGNOSIS — L602 Onychogryphosis: Secondary | ICD-10-CM | POA: Insufficient documentation

## 2018-04-06 DIAGNOSIS — E0821 Diabetes mellitus due to underlying condition with diabetic nephropathy: Secondary | ICD-10-CM

## 2018-04-06 DIAGNOSIS — R197 Diarrhea, unspecified: Secondary | ICD-10-CM

## 2018-04-06 DIAGNOSIS — I10 Essential (primary) hypertension: Secondary | ICD-10-CM | POA: Diagnosis not present

## 2018-04-06 NOTE — Progress Notes (Signed)
BP (!) 150/80 (BP Location: Right Arm, Patient Position: Sitting, Cuff Size: Normal)   Pulse 90   Temp 98.1 F (36.7 C) (Oral)   Resp 16   Ht 5\' 3"  (1.6 m)   Wt 113 lb (51.3 kg)   SpO2 98%   BMI 20.02 kg/m    Subjective:    Patient ID: Julie Holland, female    DOB: 03/01/1941, 77 y.o.   MRN: [id number]  HPI: Julie Holland is a 77 y.o. female  Chief Complaint  Patient presents with  . Diabetes  . Hypertension  . Hyperlipidemia  . Medication Refill    HPI Patient fell a few weeks ago in the yard when she tripped on a root. Had an infection in her hand had to get a PICC line placed and got antibiotics. States is improved now. No falls since then. Is doing exercise- Has follow-up in a few weeks with Dr. Kyla Balzarine at [place]- plan to release if still doing well. Patient has PICC line removed last week and is on oral keflex BID. Endorses a little bit of diarrhea intermittently- states yesterday had large watery diarrhea.  Uses city water, no recent cruise trips, pet turtles, no one else in family has this symptom.   Diabetes Mellitus Patient is rx metformin 850mg  BID, and lisinopril for nephroprotection. Takes medications as prescribed with no missed doses a month.  Diet: not a big appetite, doesn't eat as much as she should but makes herself eat throughout the day-  Checks blood sugars usually every morning. Ranging from 130-150 over the past month.  Denies polyphagia, polydipsia, polyuria.  Lab Results  Component Value Date   HGBA1C 6.7 (H) 02/19/2018    Hypertension Patient is on coreg 6.25 mg BID, lisinopril 10mg  daily.- states was increased at kidney doctor in 11/2017, but missed her follow-up appointment due to being in the hospital. She has white coat syndrome.  Takes medications as prescribed with nomissed doses a month.  She is compliant with low-salt diet.  Was checking BP in the past but stopped when she was on the PICC line and forgot to afterwards.  Denies chest  pain, headaches, blurry vision. BP Readings from Last 3 Encounters:  04/06/18 (!) 150/80  03/31/18 (!) 164/81  03/08/18 (!) 144/78    Hyperlipidemia Patient rx zocor 20mg  nightly. Takes medications as prescribed with no missed doses a month.  Diet: at least a dozen vegetable a week. Eats out 7-14 times a week. Fried foods- no  Denies myalgias Lab Results  Component Value Date   CHOL 113 10/05/2017   HDL 50 (L) 10/05/2017   LDLCALC 49 10/05/2017   LDLDIRECT 67.5 07/13/2012   TRIG 60 10/05/2017   CHOLHDL 2.3 10/05/2017   Asthma Patient takes advair daily states breathing is well controlled. Only Uses albuterol as needed hasn't needed it in awhile.   Depression screen [facility] 2/9 04/06/2018 03/08/2018 02/04/2018 12/10/2017 09/11/2017  Decreased Interest 0 0 0 0 0  Down, Depressed, Hopeless 0 0 0 0 0  PHQ - 2 Score 0 0 0 0 0  Altered sleeping - - 0 - -  Tired, decreased energy - - 0 - -  Change in appetite - - 0 - -  Feeling bad or failure about yourself  - - 0 - -  Trouble concentrating - - 0 - -  Moving slowly or fidgety/restless - - 0 - -  Suicidal thoughts - - 0 - -  PHQ-9 Score - - 0 - -  Difficult doing work/chores - - Not difficult at all - -    Relevant past medical, surgical, family and social history reviewed Past Medical History:  Diagnosis Date  . Arthritis   . Asthma   . 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  . Cataract   . Chronic kidney disease    stage 1  . Diabetes mellitus 2007   type II  . Dry eye of right side   . GERD (gastroesophageal reflux disease)    OCC  . Glaucoma 2018   RIGHT EYE   . Hemorrhoids   . Hyperlipidemia   . Hypertension    Past Surgical History:  Procedure Laterality Date  . CATARACT EXTRACTION, BILATERAL    . CHOLECYSTECTOMY    . COLONOSCOPY  02/13/13   Dr Bary Castilla  . EPIGASTRIC HERNIA REPAIR N/A 03/20/2015   Procedure: HERNIA REPAIR EPIGASTRIC ADULT;  Surgeon:  Robert Bellow, MD;  Location: [facility] ORS;  Service: General;  Laterality: N/A;  . ESOPHAGOGASTRODUODENOSCOPY N/A 02/19/2018   Procedure: ESOPHAGOGASTRODUODENOSCOPY (EGD);  Surgeon: Lin Landsman, MD;  Location: [facility] ENDOSCOPY;  Service: Gastroenterology;  Laterality: N/A;  . HEMORRHOID SURGERY N/A 03/20/2015    FOCAL HIGH-GRADE SQUAMOUS INTRAEPITHELIAL LESION (HSIL, ANAL /HEMORRHOIDECTOMY;   Robert Bellow, MD [facility] ORS;  : General;  Laterality: N/A;  . HERNIA REPAIR  July 2016   Epigastric hernia, primary repair  . INCISION AND DRAINAGE Right 02/11/2018   Procedure: INCISION AND DRAINAGE- RIGHT HAND;  Surgeon: Hessie Knows, MD;  Location: [facility] ORS;  Service: Orthopedics;  Laterality: Right;  . TONSILLECTOMY  age 77  . TOTAL ABDOMINAL HYSTERECTOMY  01/1989  . TUBAL LIGATION    . 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  Case has been presented at the [facility] tumor board. No indication for additional treatment outside of serial exams  Surgeon: Robert Bellow, MD;  Location: [facility] ORS;  Service: General;  Laterality: N/A;   Family History  Problem Relation Age of Onset  . Bladder Cancer Mother   . Colon cancer Father   . Lung cancer Brother   . Lymphoma Sister   . Breast cancer Neg Hx    Social History   Tobacco Use  . Smoking status: Never Smoker  . Smokeless tobacco: Never Used  . Tobacco comment: smoking cessation materials not required  Substance Use Topics  . Alcohol use: No  . Drug use: No    Interim medical history since last visit reviewed. Allergies and medications reviewed  Review of Systems  Constitutional: Negative for fever.  Gastrointestinal: Positive for diarrhea. Negative for blood in stool.   Per HPI unless specifically indicated above     Objective:    BP (!) 150/80 (BP Location: Right Arm, Patient Position: Sitting, Cuff Size: Normal)   Pulse 90   Temp 98.1  F (36.7 C) (Oral)   Resp 16   Ht 5\' 3"  (1.6 m)   Wt 113 lb (51.3 kg)   SpO2 98%   BMI 20.02 kg/m   Wt Readings from Last 3 Encounters:  04/06/18 113 lb (51.3 kg)  03/31/18 111 lb 1.9 oz (50.4 kg)  03/08/18 110 lb (49.9 kg)    Physical Exam  Constitutional: She appears well-developed and well-nourished. No distress.  HENT:  Head: Normocephalic and atraumatic.  Eyes: EOM are normal. No scleral icterus.  Neck: No  thyromegaly present.  Cardiovascular: Normal rate, regular rhythm and normal heart sounds.  No murmur heard. Pulmonary/Chest: Effort normal and breath sounds normal. No respiratory distress. She has no wheezes.  Abdominal: Soft. Bowel sounds are normal. She exhibits no distension.  Musculoskeletal: Normal range of motion. She exhibits no edema.  Neurological: She is alert. She exhibits normal muscle tone.  Skin: Skin is warm and dry. She is not diaphoretic. No pallor.  Psychiatric: She has a normal mood and affect. Her behavior is normal. Judgment and thought content normal.   Diabetic Foot Form - Detailed   Diabetic Foot Exam - detailed Diabetic Foot exam was performed with the following findings:  Yes 04/06/2018 11:08 AM  Visual Foot Exam completed.:  Yes  Pulse Foot Exam completed.:  Yes  Right Dorsalis Pedis:  Present Left Dorsalis Pedis:  Present  Sensory Foot Exam Completed.:  Yes Semmes-Weinstein Monofilament Test R Site 1-Great Toe:  Pos L Site 1-Great Toe:  Pos        Results for orders placed or performed in visit on 03/10/18  CBC and differential  Result Value Ref Range   Hemoglobin 9.1 (A) 12.0 - 16.0   HCT 30 (A) 36 - 46   Platelets 114 (A) 150 - 399   WBC 4.4   Basic metabolic panel  Result Value Ref Range   Glucose 171    BUN 31 (A) 4 - 21   Creatinine 0.6 0.5 - 1.1   Potassium 3.8 3.4 - 5.3   Sodium 132 (A) 137 - 147  Dr. Rudene Christians is following those    Assessment & Plan:   Problem List Items Addressed This Visit      Cardiovascular and  Mediastinum   Hypertension (Chronic)    Managed by Dr. Candiss Norse; some white coat, but consistently high; note to nephrologist; consider either increasing the ACE-I for the microalbuminuria or increasing the coreg for the BP and pulse; will defer to his judgment and work with him; DASH guidelines        Respiratory   Intrinsic asthma   Asthma    Controlled; continue meds        Endocrine   Diabetes mellitus, controlled ([place]) - Primary    Last a1c UTD; controlled; patient finds checking FSBS helpful        Musculoskeletal and Integument   Onychogryphosis    Patient can see podiatrist every 3 months        Other   Hyperlipidemia   Thrombocytopenia ([place])    Monitored by Dr. Rudene Christians; no bruising or bleeding       Other Visit Diagnoses    Diarrhea, unspecified type       Relevant Orders   Stool Culture   C. difficile GDH and Toxin A/B   Estrogen deficiency       Relevant Orders   DG Bone Density       Follow up plan: Return in about 5 months (around 08/21/2018) for twenty minute follow-up with fasting labs; return in 2 months for other f/u.  An after-visit summary was printed and given to the patient at [place].  Please see the patient instructions which may contain other information and recommendations beyond what is mentioned above in the assessment and plan.  No orders of the defined types were placed in this encounter.   Orders Placed This Encounter  Procedures  . Stool Culture  . DG Bone Density  . C. difficile GDH and Toxin A/B

## 2018-04-06 NOTE — Assessment & Plan Note (Signed)
Monitored by Dr. Rudene Christians; no bruising or bleeding

## 2018-04-06 NOTE — Assessment & Plan Note (Signed)
Last a1c UTD; controlled; patient finds checking FSBS helpful

## 2018-04-06 NOTE — Assessment & Plan Note (Signed)
Managed by Dr. Candiss Norse; some white coat, but consistently high; note to nephrologist; consider either increasing the ACE-I for the microalbuminuria or increasing the coreg for the BP and pulse; will defer to his judgment and work with him; DASH guidelines

## 2018-04-06 NOTE — Patient Instructions (Addendum)
-   Taking a yogurt with probiotic like Activa yogurt daily; or taking a daily OTC probiotic may help prevent some diarrhea and promote gut health.   Please do call to schedule your bone density study; the number to schedule one at either [facility] or [facility] is [phone] or [phone]

## 2018-04-06 NOTE — Assessment & Plan Note (Signed)
Controlled; continue meds 

## 2018-04-06 NOTE — Assessment & Plan Note (Signed)
Patient can see podiatrist every 3 months

## 2018-04-09 ENCOUNTER — Encounter: Payer: Self-pay | Admitting: Gastroenterology

## 2018-04-09 ENCOUNTER — Other Ambulatory Visit
Admission: RE | Admit: 2018-04-09 | Discharge: 2018-04-09 | Disposition: A | Discharge status: Home / Self Care | Payer: Medicare Other | Source: Ambulatory Visit | Attending: Gastroenterology | Admitting: Gastroenterology

## 2018-04-09 ENCOUNTER — Ambulatory Visit: Payer: Medicare Other | Admitting: Gastroenterology

## 2018-04-09 VITALS — BP 136/73 | HR 84 | Resp 17 | Ht 63.0 in | Wt 112.6 lb

## 2018-04-09 DIAGNOSIS — K279 Peptic ulcer, site unspecified, unspecified as acute or chronic, without hemorrhage or perforation: Secondary | ICD-10-CM | POA: Insufficient documentation

## 2018-04-09 LAB — CBC
HCT: 29.6 % — ABNORMAL LOW (ref 35.0–47.0)
Hemoglobin: 10 g/dL — ABNORMAL LOW (ref 12.0–16.0)
MCH: 30.3 pg (ref 26.0–34.0)
MCHC: 33.7 g/dL (ref 32.0–36.0)
MCV: 89.9 fL (ref 80.0–100.0)
Platelets: 138 10*3/uL — ABNORMAL LOW (ref 150–440)
RBC: 3.29 MIL/uL — ABNORMAL LOW (ref 3.80–5.20)
RDW: 17.4 % — ABNORMAL HIGH (ref 11.5–14.5)
WBC: 9.9 10*3/uL (ref 3.6–11.0)

## 2018-04-09 LAB — FERRITIN: Ferritin: 67 ng/mL (ref 11–307)

## 2018-04-09 NOTE — Progress Notes (Signed)
Cephas Darby, MD [address]  [place], [place] [address]  Main: [phone]  Fax: [phone]    Gastroenterology Consultation  Referring Provider:     [name], MD Primary Care Physician:  [name], MD Primary Gastroenterologist:  Dr. Cephas Darby Reason for Consultation:     Anemia, peptic ulcer disease        HPI:   Julie Holland is a 77 y.o. female referred by Dr. Sanda Klein, Satira Anis, MD  for consultation & management of acute anemia. She was admitted to [facility] in 02/2018 secondary to severe anemia, hemoglobin 5.2 that was detected on outpatient labs. She developed severe anemia after she fell in the ER in 01/2018 and underwent debridement and drainage of abscess that she developed secondary to MSSA infection. She received 2 units of PRBCs and responded appropriately. She did not have any signs of GI bleed at that time. She also underwent upper endoscopy during this admission, revealed multiple clean-based gastric ulcers.she was discharged home on Protonix 40 mg twice daily along with oral iron daily  Interval summary: Since hospital discharge, patient has been doing well. She reports having brown bowel movements daily. She does feel tired but not worse since discharge. She is taking oral iron once daily and Protonix 40 mg twice daily. She is accompanied by her husband today.  NSAIDs: none  Antiplts/Anticoagulants/Anti thrombotics: aspirin 81  GI Procedures:  EGD 02/19/2018 - Normal duodenal bulb and second portion of the duodenum. - Non-bleeding gastric ulcers with a clean ulcer base ([place] Class III). - Gastritis. Biopsied. - Normal gastroesophageal junction and esophagus. DIAGNOSIS:  A. RANDOM STOMACH; COLD BIOPSY:  - MODERATE CHRONIC GASTRITIS.  - NEGATIVE FOR DYSPLASIA AND MALIGNANCY.   Colonoscopy 10/2002 by Dr. Bary Castilla: Normal Colonoscopy 05/2013 by Dr Bary Castilla, showed severe diverticulosis in the  rectosigmoid area    Past Medical History:  Diagnosis Date  . Arthritis   . Asthma   . 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  . Cataract   . Chronic kidney disease    stage 1  . Diabetes mellitus 2007   type II  . Dry eye of right side   . GERD (gastroesophageal reflux disease)    OCC  . Glaucoma 2018   RIGHT EYE   . Hemorrhoids   . Hyperlipidemia   . Hypertension     Past Surgical History:  Procedure Laterality Date  . CATARACT EXTRACTION, BILATERAL    . CHOLECYSTECTOMY    . COLONOSCOPY  02/13/13   Dr Bary Castilla  . EPIGASTRIC HERNIA REPAIR N/A 03/20/2015   Procedure: HERNIA REPAIR EPIGASTRIC ADULT;  Surgeon: Robert Bellow, MD;  Location: [facility] ORS;  Service: General;  Laterality: N/A;  . ESOPHAGOGASTRODUODENOSCOPY N/A 02/19/2018   Procedure: ESOPHAGOGASTRODUODENOSCOPY (EGD);  Surgeon: Lin Landsman, MD;  Location: [facility] ENDOSCOPY;  Service: Gastroenterology;  Laterality: N/A;  . HEMORRHOID SURGERY N/A 03/20/2015    FOCAL HIGH-GRADE SQUAMOUS INTRAEPITHELIAL LESION (HSIL, ANAL /HEMORRHOIDECTOMY;   Robert Bellow, MD [facility] ORS;  : General;  Laterality: N/A;  . HERNIA REPAIR  July 2016   Epigastric hernia, primary repair  . INCISION AND DRAINAGE Right 02/11/2018   Procedure: INCISION AND DRAINAGE- RIGHT HAND;  Surgeon: Hessie Knows, MD;  Location: [facility] ORS;  Service: Orthopedics;  Laterality: Right;  . TONSILLECTOMY  age 56  . TOTAL ABDOMINAL HYSTERECTOMY  01/1989  . TUBAL LIGATION    .  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  Case has been presented at the [facility] tumor board. No indication for additional treatment outside of serial exams  Surgeon: Robert Bellow, MD;  Location: [facility] ORS;  Service: General;  Laterality: N/A;    Current Outpatient Medications:  .  ACCU-CHEK SMARTVIEW test strip, USE AS INSTRUCTED TO  CHECK  BLOOD GLUCOSE ONCE DAILY if desired; LON 99 months; Dx E11.9, Disp: 100 each, Rfl: 1 .  acetaminophen (TYLENOL) 500 MG tablet, Take 500-1,000 mg by mouth every 6 (six) hours as needed (for pain)., Disp: , Rfl:  .  ADVAIR DISKUS 100-50 MCG/DOSE AEPB, USE 1 PUFF TWO TIMES DAILY (Patient taking differently: daily), Disp: 3 each, Rfl: 2 .  albuterol (PROVENTIL HFA;VENTOLIN HFA) 108 (90 Base) MCG/ACT inhaler, Inhale 2 puffs into the lungs every 6 (six) hours as needed for wheezing or shortness of breath., Disp: 1 Inhaler, Rfl: 2 .  Carboxymeth-Glycerin-Polysorb (REFRESH OPTIVE ADVANCED OP), Place 1 drop into both eyes 3 (three) times daily as needed (for dry/irritated eyes)., Disp: , Rfl:  .  carvedilol (COREG) 6.25 MG tablet, Take 1 tablet (6.25 mg total) by mouth 2 (two) times daily with a meal., Disp: 180 tablet, Rfl: 1 .  ferrous sulfate 325 (65 FE) MG tablet, Take 1 tablet (325 mg total) by mouth daily., Disp: , Rfl:  .  latanoprost (XALATAN) 0.005 % ophthalmic solution, Place 1 drop at bedtime into the right eye., Disp: , Rfl:  .  lisinopril (PRINIVIL,ZESTRIL) 10 MG tablet, Take 1 tablet (10 mg total) by mouth daily., Disp: 90 tablet, Rfl: 0 .  metFORMIN (GLUCOPHAGE) 850 MG tablet, Take 1 tablet (850 mg total) by mouth 2 (two) times daily with a meal., Disp: 180 tablet, Rfl: 0 .  Multiple Vitamin (MULTIVITAMIN) tablet, Take 2 tablets by mouth daily. , Disp: , Rfl:  .  Nutritional Supplements (ESTROVEN PO), Take 1 tablet by mouth daily., Disp: , Rfl:  .  pantoprazole (PROTONIX) 40 MG tablet, Take 1 tablet (40 mg total) by mouth 2 (two) times daily before a meal., Disp: 180 tablet, Rfl: 0 .  simvastatin (ZOCOR) 20 MG tablet, Take 1 tablet (20 mg total) by mouth at bedtime., Disp: 90 tablet, Rfl: 0 .  cephALEXin (KEFLEX) 500 MG capsule, Take 1 capsule (500 mg total) by mouth 2 (two) times daily. (Patient not taking: Reported on 04/09/2018), Disp: 84 capsule, Rfl: 1    Family History  Problem  Relation Age of Onset  . Bladder Cancer Mother   . Colon cancer Father   . Lung cancer Brother   . Lymphoma Sister   . Breast cancer Neg Hx      Social History   Tobacco Use  . Smoking status: Never Smoker  . Smokeless tobacco: Never Used  . Tobacco comment: smoking cessation materials not required  Substance Use Topics  . Alcohol use: No  . Drug use: No    Allergies as of 04/09/2018 - Review Complete 04/09/2018  Allergen Reaction Noted  . Hydrocodone Nausea And Vomiting and Other (See Comments) 07/31/2016    Review of Systems:    All systems reviewed and negative except where noted in HPI.   Physical Exam:  BP 136/73 (BP Location: Left Arm, Patient Position: Sitting, Cuff Size: Normal)   Pulse 84   Resp 17   Ht 5\' 3"  (1.6 m)   Wt 112 lb 9.6 oz (51.1  kg)   BMI 19.95 kg/m  No LMP recorded. Patient has had a hysterectomy.  General:   Alert,  Well-developed, well-nourished, pleasant and cooperative in NAD Head:  Normocephalic and atraumatic. Eyes:  Sclera clear, no icterus.   Conjunctiva pink. Ears:  Normal auditory acuity. Nose:  No deformity, discharge, or lesions. Mouth:  No deformity or lesions,oropharynx pink & moist. Neck:  Supple; no masses or thyromegaly. Lungs:  Respirations even and unlabored.  Clear throughout to auscultation.   No wheezes, crackles, or rhonchi. No acute distress. Heart:  Regular rate and rhythm; no murmurs, clicks, rubs, or gallops. Abdomen:  Normal bowel sounds. Soft, non-tender and non-distended without masses, hepatosplenomegaly or hernias noted.  No guarding or rebound tenderness.   Rectal: Not performed Msk:  Symmetrical without gross deformities. Good, equal movement & strength bilaterally. Pulses:  Normal pulses noted. Extremities:  No clubbing or edema.  No cyanosis. Neurologic:  Alert and oriented x3;  grossly normal neurologically. Skin:  Intact without significant lesions or rashes. No jaundice. Lymph Nodes:  No significant  cervical adenopathy. Psych:  Alert and cooperative. Normal mood and affect.  Imaging Studies: reviewed  Assessment and Plan:   Julie Holland is a 77 y.o. 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 Iron studies are consistent with anemia of chronic disease. EGD revealed clean-based gastric ulcers. Unclear if she has H. Pylori as IHC stain is pending.   Peptic ulcer disease: Likely stress ulcers from recent surgery Continue Protonix 40 mg twice daily for 3 months total She can stop oral iron Check CBC, ferritin today Request pathology about the IHC stain on gastric biopsies  Chronic thrombocytopenia: Followed by Dr. 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

## 2018-04-13 ENCOUNTER — Other Ambulatory Visit: Payer: Self-pay | Admitting: Family Medicine

## 2018-04-13 ENCOUNTER — Other Ambulatory Visit: Payer: Self-pay

## 2018-04-13 DIAGNOSIS — I10 Essential (primary) hypertension: Secondary | ICD-10-CM

## 2018-04-13 DIAGNOSIS — R809 Proteinuria, unspecified: Secondary | ICD-10-CM

## 2018-04-13 LAB — SURGICAL PATHOLOGY

## 2018-04-13 NOTE — Telephone Encounter (Signed)
Last Cr and K+ reviewed ACE-I refills approved

## 2018-04-13 NOTE — Patient Outreach (Signed)
Julie Holland Julie Holland) Care Management  04/13/2018  Julie Holland 1940-09-20 [id number]   Medication Adherence call to Julie Holland spoke with patient she ask if we can call [facility] and order her two medication Lisinopril 10 and Metformin 850 mg call [facility] they will mail both medication between 7-10 business day, call  patient back to let her know when her mail order will arrive, patient ask not to order any more that she will order it her self. Julie Holland is showing past due under [place].  [facility] Direct Dial [phone]  Fax [phone] [email or web address]

## 2018-04-15 ENCOUNTER — Other Ambulatory Visit: Payer: Self-pay

## 2018-04-15 ENCOUNTER — Telehealth: Payer: Self-pay | Admitting: *Deleted

## 2018-04-15 NOTE — Telephone Encounter (Signed)
Patient called. SHe has been having diarrhea for the last 2 days, twice a day, due to the cephalexin.  SHe has dropped the cephalexin to once daily, but is still having issues. SHe takes the cephalexin with food, is able to keep food/fluids down without issue.  SHe denies any foul odor, blood or mucous. Please advise. Landis Gandy, RN

## 2018-04-16 ENCOUNTER — Ambulatory Visit
Admit: 2018-04-16 | Discharge: 2018-04-30 | Disposition: A | Discharge status: Skilled Nursing Facility | Payer: MEDICARE | Admitting: Internal Medicine

## 2018-04-16 ENCOUNTER — Emergency Department: Payer: Medicare Other

## 2018-04-16 ENCOUNTER — Other Ambulatory Visit: Payer: Self-pay

## 2018-04-16 ENCOUNTER — Telehealth: Payer: Self-pay | Admitting: *Deleted

## 2018-04-16 ENCOUNTER — Emergency Department
Admission: EM | Admit: 2018-04-16 | Discharge: 2018-04-16 | Disposition: A | Discharge status: Other Acute Inpatient Hospital | Payer: Medicare Other | Attending: Student in an Organized Health Care Education/Training Program | Admitting: Student in an Organized Health Care Education/Training Program

## 2018-04-16 DIAGNOSIS — R109 Unspecified abdominal pain: Principal | ICD-10-CM

## 2018-04-16 DIAGNOSIS — I700 Atherosclerosis of aorta: Secondary | ICD-10-CM | POA: Diagnosis not present

## 2018-04-16 DIAGNOSIS — D649 Anemia, unspecified: Secondary | ICD-10-CM

## 2018-04-16 DIAGNOSIS — R931 Abnormal findings on diagnostic imaging of heart and coronary circulation: Secondary | ICD-10-CM | POA: Diagnosis not present

## 2018-04-16 DIAGNOSIS — I340 Nonrheumatic mitral (valve) insufficiency: Secondary | ICD-10-CM | POA: Diagnosis not present

## 2018-04-16 DIAGNOSIS — Z79899 Other long term (current) drug therapy: Secondary | ICD-10-CM | POA: Insufficient documentation

## 2018-04-16 DIAGNOSIS — W01198A Fall on same level from slipping, tripping and stumbling with subsequent striking against other object, initial encounter: Secondary | ICD-10-CM | POA: Insufficient documentation

## 2018-04-16 DIAGNOSIS — Z7984 Long term (current) use of oral hypoglycemic drugs: Secondary | ICD-10-CM | POA: Diagnosis not present

## 2018-04-16 DIAGNOSIS — M6281 Muscle weakness (generalized): Secondary | ICD-10-CM | POA: Diagnosis not present

## 2018-04-16 DIAGNOSIS — Z85048 Personal history of other malignant neoplasm of rectum, rectosigmoid junction, and anus: Secondary | ICD-10-CM | POA: Diagnosis not present

## 2018-04-16 DIAGNOSIS — C9200 Acute myeloblastic leukemia, not having achieved remission: Secondary | ICD-10-CM | POA: Diagnosis not present

## 2018-04-16 DIAGNOSIS — R197 Diarrhea, unspecified: Secondary | ICD-10-CM | POA: Insufficient documentation

## 2018-04-16 DIAGNOSIS — L0390 Cellulitis, unspecified: Secondary | ICD-10-CM | POA: Diagnosis not present

## 2018-04-16 DIAGNOSIS — R5383 Other fatigue: Secondary | ICD-10-CM | POA: Diagnosis not present

## 2018-04-16 DIAGNOSIS — Y999 Unspecified external cause status: Secondary | ICD-10-CM | POA: Diagnosis not present

## 2018-04-16 DIAGNOSIS — S301XXA Contusion of abdominal wall, initial encounter: Secondary | ICD-10-CM | POA: Diagnosis not present

## 2018-04-16 DIAGNOSIS — M25531 Pain in right wrist: Secondary | ICD-10-CM | POA: Diagnosis not present

## 2018-04-16 DIAGNOSIS — S7002XA Contusion of left hip, initial encounter: Secondary | ICD-10-CM | POA: Diagnosis not present

## 2018-04-16 DIAGNOSIS — I129 Hypertensive chronic kidney disease with stage 1 through stage 4 chronic kidney disease, or unspecified chronic kidney disease: Secondary | ICD-10-CM | POA: Diagnosis not present

## 2018-04-16 DIAGNOSIS — K219 Gastro-esophageal reflux disease without esophagitis: Secondary | ICD-10-CM | POA: Diagnosis not present

## 2018-04-16 DIAGNOSIS — H4010X0 Unspecified open-angle glaucoma, stage unspecified: Secondary | ICD-10-CM | POA: Diagnosis not present

## 2018-04-16 DIAGNOSIS — J45909 Unspecified asthma, uncomplicated: Secondary | ICD-10-CM | POA: Insufficient documentation

## 2018-04-16 DIAGNOSIS — C9240 Acute promyelocytic leukemia, not having achieved remission: Secondary | ICD-10-CM | POA: Diagnosis not present

## 2018-04-16 DIAGNOSIS — D61810 Antineoplastic chemotherapy induced pancytopenia: Secondary | ICD-10-CM | POA: Diagnosis not present

## 2018-04-16 DIAGNOSIS — E785 Hyperlipidemia, unspecified: Secondary | ICD-10-CM | POA: Diagnosis not present

## 2018-04-16 DIAGNOSIS — S63591A Other specified sprain of right wrist, initial encounter: Secondary | ICD-10-CM | POA: Diagnosis not present

## 2018-04-16 DIAGNOSIS — Z9049 Acquired absence of other specified parts of digestive tract: Secondary | ICD-10-CM | POA: Diagnosis not present

## 2018-04-16 DIAGNOSIS — I517 Cardiomegaly: Secondary | ICD-10-CM | POA: Diagnosis not present

## 2018-04-16 DIAGNOSIS — R52 Pain, unspecified: Secondary | ICD-10-CM | POA: Diagnosis not present

## 2018-04-16 DIAGNOSIS — E1122 Type 2 diabetes mellitus with diabetic chronic kidney disease: Secondary | ICD-10-CM | POA: Diagnosis not present

## 2018-04-16 DIAGNOSIS — I10 Essential (primary) hypertension: Secondary | ICD-10-CM | POA: Diagnosis not present

## 2018-04-16 DIAGNOSIS — L539 Erythematous condition, unspecified: Secondary | ICD-10-CM | POA: Diagnosis not present

## 2018-04-16 DIAGNOSIS — Y92003 Bedroom of unspecified non-institutional (private) residence as the place of occurrence of the external cause: Secondary | ICD-10-CM | POA: Diagnosis not present

## 2018-04-16 DIAGNOSIS — S300XXA Contusion of lower back and pelvis, initial encounter: Secondary | ICD-10-CM | POA: Insufficient documentation

## 2018-04-16 DIAGNOSIS — R5381 Other malaise: Secondary | ICD-10-CM | POA: Diagnosis not present

## 2018-04-16 DIAGNOSIS — I6782 Cerebral ischemia: Secondary | ICD-10-CM | POA: Diagnosis not present

## 2018-04-16 DIAGNOSIS — L02413 Cutaneous abscess of right upper limb: Secondary | ICD-10-CM | POA: Diagnosis not present

## 2018-04-16 DIAGNOSIS — J449 Chronic obstructive pulmonary disease, unspecified: Secondary | ICD-10-CM | POA: Diagnosis not present

## 2018-04-16 DIAGNOSIS — I5030 Unspecified diastolic (congestive) heart failure: Secondary | ICD-10-CM | POA: Diagnosis not present

## 2018-04-16 DIAGNOSIS — R55 Syncope and collapse: Secondary | ICD-10-CM | POA: Diagnosis not present

## 2018-04-16 DIAGNOSIS — S3992XA Unspecified injury of lower back, initial encounter: Secondary | ICD-10-CM | POA: Diagnosis present

## 2018-04-16 DIAGNOSIS — R262 Difficulty in walking, not elsewhere classified: Secondary | ICD-10-CM | POA: Diagnosis not present

## 2018-04-16 DIAGNOSIS — R531 Weakness: Secondary | ICD-10-CM | POA: Diagnosis not present

## 2018-04-16 DIAGNOSIS — Y9389 Activity, other specified: Secondary | ICD-10-CM | POA: Diagnosis not present

## 2018-04-16 DIAGNOSIS — I349 Nonrheumatic mitral valve disorder, unspecified: Secondary | ICD-10-CM | POA: Diagnosis not present

## 2018-04-16 DIAGNOSIS — R279 Unspecified lack of coordination: Secondary | ICD-10-CM | POA: Diagnosis not present

## 2018-04-16 DIAGNOSIS — Z801 Family history of malignant neoplasm of trachea, bronchus and lung: Secondary | ICD-10-CM | POA: Diagnosis not present

## 2018-04-16 DIAGNOSIS — D509 Iron deficiency anemia, unspecified: Secondary | ICD-10-CM | POA: Diagnosis not present

## 2018-04-16 DIAGNOSIS — N181 Chronic kidney disease, stage 1: Secondary | ICD-10-CM | POA: Diagnosis not present

## 2018-04-16 DIAGNOSIS — M25552 Pain in left hip: Secondary | ICD-10-CM | POA: Diagnosis not present

## 2018-04-16 DIAGNOSIS — N951 Menopausal and female climacteric states: Secondary | ICD-10-CM | POA: Diagnosis not present

## 2018-04-16 DIAGNOSIS — Z800 Family history of malignant neoplasm of digestive organs: Secondary | ICD-10-CM | POA: Diagnosis not present

## 2018-04-16 DIAGNOSIS — E119 Type 2 diabetes mellitus without complications: Secondary | ICD-10-CM | POA: Diagnosis not present

## 2018-04-16 DIAGNOSIS — I080 Rheumatic disorders of both mitral and aortic valves: Secondary | ICD-10-CM | POA: Diagnosis not present

## 2018-04-16 DIAGNOSIS — D696 Thrombocytopenia, unspecified: Secondary | ICD-10-CM | POA: Diagnosis not present

## 2018-04-16 DIAGNOSIS — D61818 Other pancytopenia: Secondary | ICD-10-CM | POA: Diagnosis not present

## 2018-04-16 DIAGNOSIS — L02419 Cutaneous abscess of limb, unspecified: Secondary | ICD-10-CM | POA: Diagnosis not present

## 2018-04-16 DIAGNOSIS — S0990XA Unspecified injury of head, initial encounter: Secondary | ICD-10-CM | POA: Diagnosis not present

## 2018-04-16 LAB — CBC WITH DIFFERENTIAL/PLATELET
Band Neutrophils: 1 %
Basophils Absolute: 0.8 10*3/uL — ABNORMAL HIGH (ref 0–0.1)
Basophils Relative: 4 %
Blasts: 0 %
Eosinophils Absolute: 1.2 10*3/uL — ABNORMAL HIGH (ref 0–0.7)
Eosinophils Relative: 6 %
HCT: 20.3 % — ABNORMAL LOW (ref 35.0–47.0)
Hemoglobin: 6.7 g/dL — ABNORMAL LOW (ref 12.0–16.0)
Lymphocytes Relative: 55 %
Lymphs Abs: 10.6 10*3/uL — ABNORMAL HIGH (ref 1.0–3.6)
MCH: 30.4 pg (ref 26.0–34.0)
MCHC: 32.9 g/dL (ref 32.0–36.0)
MCV: 92.4 fL (ref 80.0–100.0)
Metamyelocytes Relative: 0 %
Monocytes Absolute: 1.3 10*3/uL — ABNORMAL HIGH (ref 0.2–0.9)
Monocytes Relative: 7 %
Myelocytes: 0 %
Neutro Abs: 4.6 10*3/uL (ref 1.4–6.5)
Neutrophils Relative %: 23 %
Other: 4 %
Platelets: 113 10*3/uL — ABNORMAL LOW (ref 150–440)
Promyelocytes Relative: 0 %
RBC: 2.2 MIL/uL — ABNORMAL LOW (ref 3.80–5.20)
RDW: 18.1 % — ABNORMAL HIGH (ref 11.5–14.5)
WBC: 19.2 10*3/uL — ABNORMAL HIGH (ref 3.6–11.0)
nRBC: 0 /100 WBC

## 2018-04-16 LAB — COMPREHENSIVE METABOLIC PANEL
ALT: 16 U/L (ref 0–44)
AST: 29 U/L (ref 15–41)
Albumin: 3 g/dL — ABNORMAL LOW (ref 3.5–5.0)
Alkaline Phosphatase: 91 U/L (ref 38–126)
Anion gap: 7 (ref 5–15)
BUN: 19 mg/dL (ref 8–23)
CO2: 28 mmol/L (ref 22–32)
Calcium: 8.3 mg/dL — ABNORMAL LOW (ref 8.9–10.3)
Chloride: 99 mmol/L (ref 98–111)
Creatinine, Ser: 0.6 mg/dL (ref 0.44–1.00)
GFR calc Af Amer: >60 mL/min (ref >60)
GFR calc non Af Amer: >60 mL/min (ref >60)
Glucose, Bld: 125 mg/dL — ABNORMAL HIGH (ref 70–99)
Potassium: 3.7 mmol/L (ref 3.5–5.1)
Sodium: 134 mmol/L — ABNORMAL LOW (ref 135–145)
Total Bilirubin: 0.9 mg/dL (ref 0.3–1.2)
Total Protein: 7.2 g/dL (ref 6.5–8.1)

## 2018-04-16 LAB — C DIFFICILE QUICK SCREEN W PCR REFLEX
C Diff antigen: NEGATIVE
C Diff interpretation: NOT DETECTED
C Diff toxin: NEGATIVE

## 2018-04-16 LAB — PROTIME-INR
INR: 1.16
Prothrombin Time: 14.7 seconds (ref 11.4–15.2)

## 2018-04-16 LAB — TYPE AND SCREEN
ABO/RH(D): A POS
Antibody Screen: NEGATIVE

## 2018-04-16 LAB — TROPONIN I: Troponin I: <0.03 ng/mL (ref <0.03)

## 2018-04-16 LAB — APTT: aPTT: 28 seconds (ref 24–36)

## 2018-04-16 LAB — GLUCOSE, CAPILLARY: Glucose-Capillary: 87 mg/dL (ref 70–99)

## 2018-04-16 LAB — PATHOLOGIST SMEAR REVIEW

## 2018-04-16 MED ORDER — IOHEXOL 300 MG/ML  SOLN
75.0000 mL | Freq: Once | INTRAMUSCULAR | Status: AC | PRN
  Administered 2018-04-16: 75 mL via INTRAVENOUS

## 2018-04-16 NOTE — ED Notes (Signed)
Report to Merry Proud, RN ED charge nurse, [facility]

## 2018-04-16 NOTE — Telephone Encounter (Signed)
Patient is in the ED and her CBC shows 20% blasts. Per Dr. Reuel Derby' request, message taken and handed to Honor Loh, NP and Dr. Mike Gip.   dhs

## 2018-04-16 NOTE — ED Provider Notes (Signed)
[facility] Emergency Department Provider Note    First MD Initiated Contact with Patient 04/16/18 1143     (approximate)  I have reviewed the triage vital signs and the nursing notes.   HISTORY  Chief Complaint Loss of Consciousness    HPI Julie Holland is a 77 y.o. female to ER for evaluation of syncopal episode that occurred while the patient was fixing the sheets in her bed this morning.  States that she stood up and started feeling lightheaded fell over hitting her head against the bedroom door.  Denied any chest pain or shortness of breath.  States that she has been having several days of multiple episodes of watery diarrhea and is on Keflex after recent hospitalization for sepsis.  Denies any history of C. difficile.  She is concerned that her fainting spell may have been related to her taking too much of her blood pressure medication.    Past Medical History:  Diagnosis Date  . Arthritis   . Asthma   . 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  . Cataract   . Chronic kidney disease    stage 1  . Diabetes mellitus 2007   type II  . Dry eye of right side   . GERD (gastroesophageal reflux disease)    OCC  . Glaucoma 2018   RIGHT EYE   . Hemorrhoids   . Hyperlipidemia   . Hypertension    Family History  Problem Relation Age of Onset  . Bladder Cancer Mother   . Colon cancer Father   . Lung cancer Brother   . Lymphoma Sister   . Breast cancer Neg Hx    Past Surgical History:  Procedure Laterality Date  . CATARACT EXTRACTION, BILATERAL    . CHOLECYSTECTOMY    . COLONOSCOPY  02/13/13   Dr Bary Castilla  . EPIGASTRIC HERNIA REPAIR N/A 03/20/2015   Procedure: HERNIA REPAIR EPIGASTRIC ADULT;  Surgeon: Robert Bellow, MD;  Location: [facility] ORS;  Service: General;  Laterality: N/A;  . ESOPHAGOGASTRODUODENOSCOPY N/A 02/19/2018   Procedure: ESOPHAGOGASTRODUODENOSCOPY (EGD);  Surgeon:  Lin Landsman, MD;  Location: [facility] ENDOSCOPY;  Service: Gastroenterology;  Laterality: N/A;  . HEMORRHOID SURGERY N/A 03/20/2015    FOCAL HIGH-GRADE SQUAMOUS INTRAEPITHELIAL LESION (HSIL, ANAL /HEMORRHOIDECTOMY;   Robert Bellow, MD [facility] ORS;  : General;  Laterality: N/A;  . HERNIA REPAIR  July 2016   Epigastric hernia, primary repair  . INCISION AND DRAINAGE Right 02/11/2018   Procedure: INCISION AND DRAINAGE- RIGHT HAND;  Surgeon: Hessie Knows, MD;  Location: [facility] ORS;  Service: Orthopedics;  Laterality: Right;  . TONSILLECTOMY  age 71  . TOTAL ABDOMINAL HYSTERECTOMY  01/1989  . TUBAL LIGATION    . 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  Case has been presented at the [facility] tumor board. No indication for additional treatment outside of serial exams  Surgeon: Robert Bellow, MD;  Location: [facility] ORS;  Service: General;  Laterality: N/A;   Patient Active Problem List   Diagnosis Date Noted  . 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 MSSA bacteremia 03/08/2018  . Abscess of right hand 03/08/2018  . PUD (peptic ulcer disease)   . GI bleed 02/18/2018  . Cellulitis 02/10/2018  . Need for vaccination with 13-polyvalent pneumococcal conjugate vaccine 12/10/2017  . Urine test positive for microalbuminuria 12/10/2017  . Anemia  05/02/2017  . Anemia associated with acute blood loss 03/09/2017  . Arthritis of left knee 12/10/2016  . Thrombocytopenia ([place]) 10/20/2016  . Postoperative hemorrhage involving digestive system following digestive system procedure 08/04/2016  . Rectal bleeding 07/31/2016  . 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 Asthma 08/17/2015  . Umbilical hernia without obstruction and without gangrene 02/27/2015  . 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 Routine general medical examination at a health care facility 03/15/2013  .  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 Shoulder pain 11/23/2012  . Right bundle branch block 11/11/2012  . Diabetes mellitus, controlled ([place])   . Hyperlipidemia   . Hypertension       Prior to Admission medications   Medication Sig Start Date End Date Taking? Authorizing Provider  ACCU-CHEK SMARTVIEW test strip USE AS INSTRUCTED TO CHECK  BLOOD GLUCOSE ONCE DAILY if desired; LON 99 months; Dx E11.9 12/17/17   [name], MD  acetaminophen (TYLENOL) 500 MG tablet Take 500-1,000 mg by mouth every 6 (six) hours as needed (for pain).    [provider]  ADVAIR DISKUS 100-50 MCG/DOSE AEPB USE 1 PUFF TWO TIMES DAILY Patient taking differently: daily 05/29/17   Roselee Nova, MD  albuterol (PROVENTIL HFA;VENTOLIN HFA) 108 (90 Base) MCG/ACT inhaler Inhale 2 puffs into the lungs every 6 (six) hours as needed for wheezing or shortness of breath. 10/02/17   Roselee Nova, MD  Carboxymeth-Glycerin-Polysorb (REFRESH OPTIVE ADVANCED OP) Place 1 drop into both eyes 3 (three) times daily as needed (for dry/irritated eyes).    [provider]  carvedilol (COREG) 6.25 MG tablet Take 1 tablet (6.25 mg total) by mouth 2 (two) times daily with a meal. 11/26/17   Lada, Satira Anis, MD  cephALEXin (KEFLEX) 500 MG capsule Take 1 capsule (500 mg total) by mouth 2 (two) times daily. Patient not taking: Reported on 04/09/2018 03/31/18   Campbell Riches, MD  ferrous sulfate 325 (65 FE) MG tablet Take 1 tablet (325 mg total) by mouth daily. 03/01/18   Lada, Satira Anis, MD  latanoprost (XALATAN) 0.005 % ophthalmic solution Place 1 drop at bedtime into the right eye.    [provider]  lisinopril (PRINIVIL,ZESTRIL) 10 MG tablet TAKE 1 TABLET BY MOUTH  DAILY 04/13/18   [name], MD  metFORMIN (GLUCOPHAGE) 850 MG tablet Take 1 tablet (850 mg total) by mouth 2 (two) times daily with a meal. 03/10/18   Lada, Satira Anis, MD  Multiple Vitamin (MULTIVITAMIN) tablet Take 2 tablets by mouth daily.      [provider]  Nutritional Supplements (ESTROVEN PO) Take 1 tablet by mouth daily.    [provider]  pantoprazole (PROTONIX) 40 MG tablet Take 1 tablet (40 mg total) by mouth 2 (two) times daily before a meal. 02/20/18   Salary, Holly Bodily D, MD  simvastatin (ZOCOR) 20 MG tablet Take 1 tablet (20 mg total) by mouth at bedtime. 03/01/18   [name], MD    Allergies Hydrocodone    Social History Social History   Tobacco Use  . Smoking status: Never Smoker  . Smokeless tobacco: Never Used  . Tobacco comment: smoking cessation materials not required  Substance Use Topics  . Alcohol use: No  . Drug use: No    Review of Systems Patient denies headaches, rhinorrhea, blurry vision, numbness, shortness of breath, chest pain, edema, cough, abdominal pain, nausea, vomiting, diarrhea,  dysuria, fevers, rashes or hallucinations unless otherwise stated above in HPI. ____________________________________________   PHYSICAL EXAM:  VITAL SIGNS: Vitals:   04/16/18 1700 04/16/18 1730  BP: 137/69 132/71  Pulse: 88 91  Resp: (!) 27 17  Temp:    SpO2: 95% 97%    Constitutional: Alert and oriented.  Eyes: Conjunctivae are normal.  Head: Atraumatic. Nose: No congestion/rhinnorhea. Mouth/Throat: Mucous membranes are moist.   Neck: No stridor. Painless ROM.  Cardiovascular: Normal rate, regular rhythm. Grossly normal heart sounds.  Good peripheral circulation. Respiratory: Normal respiratory effort.  No retractions. Lungs CTAB. Gastrointestinal: Soft and nontender. No distention. No abdominal bruits. No CVA tenderness. Genitourinary: deferred Musculoskeletal: No lower extremity tenderness nor edema.  No joint effusions. Neurologic:  Normal speech and language. No gross focal neurologic deficits are appreciated. No facial droop Skin:  Skin is warm, dry and intact. No rash noted. Psychiatric: Mood and affect are normal. Speech and behavior are  normal.  ____________________________________________   LABS (all labs ordered are listed, but only abnormal results are displayed)  Results for orders placed or performed during the hospital encounter of 04/16/18 (from the past 24 hour(s))  C difficile quick scan w PCR reflex     Status: None   Collection Time: 04/16/18 12:05 PM  Result Value Ref Range   C Diff antigen NEGATIVE NEGATIVE   C Diff toxin NEGATIVE NEGATIVE   C Diff interpretation No C. difficile detected.   CBC with Differential/Platelet     Status: Abnormal   Collection Time: 04/16/18 12:05 PM  Result Value Ref Range   WBC 19.2 (H) 3.6 - 11.0 K/uL   RBC 2.20 (L) 3.80 - 5.20 MIL/uL   Hemoglobin 6.7 (L) 12.0 - 16.0 g/dL   HCT 20.3 (L) 35.0 - 47.0 %   MCV 92.4 80.0 - 100.0 fL   MCH 30.4 26.0 - 34.0 pg   MCHC 32.9 32.0 - 36.0 g/dL   RDW 18.1 (H) 11.5 - 14.5 %   Platelets 113 (L) 150 - 440 K/uL   Neutrophils Relative % 23 %   Lymphocytes Relative 55 %   Monocytes Relative 7 %   Eosinophils Relative 6 %   Basophils Relative 4 %   Band Neutrophils 1 %   Metamyelocytes Relative 0 %   Myelocytes 0 %   Promyelocytes Relative 0 %   Blasts 0 %   nRBC 0 0 /100 WBC   Other 4 %   Neutro Abs 4.6 1.4 - 6.5 K/uL   Lymphs Abs 10.6 (H) 1.0 - 3.6 K/uL   Monocytes Absolute 1.3 (H) 0.2 - 0.9 K/uL   Eosinophils Absolute 1.2 (H) 0 - 0.7 K/uL   Basophils Absolute 0.8 (H) 0 - 0.1 K/uL   RBC Morphology MIXED RBC POPULATION    WBC Morphology ATYPICAL LYMPHOCYTES   Comprehensive metabolic panel     Status: Abnormal   Collection Time: 04/16/18 12:05 PM  Result Value Ref Range   Sodium 134 (L) 135 - 145 mmol/L   Potassium 3.7 3.5 - 5.1 mmol/L   Chloride 99 98 - 111 mmol/L   CO2 28 22 - 32 mmol/L   Glucose, Bld 125 (H) 70 - 99 mg/dL   BUN 19 8 - 23 mg/dL   Creatinine, Ser 0.60 0.44 - 1.00 mg/dL   Calcium 8.3 (L) 8.9 - 10.3 mg/dL   Total Protein 7.2 6.5 - 8.1 g/dL   Albumin 3.0 (L) 3.5 - 5.0 g/dL   AST 29 15 - 41 U/L  ALT 16  0 - 44 U/L   Alkaline Phosphatase 91 38 - 126 U/L   Total Bilirubin 0.9 0.3 - 1.2 mg/dL   GFR calc non Af Amer >60 >60 mL/min   GFR calc Af Amer >60 >60 mL/min   Anion gap 7 5 - 15  Pathologist smear review     Status: None   Collection Time: 04/16/18 12:05 PM  Result Value Ref Range   Path Review Peripheral blood smear reviewed.   Troponin I     Status: None   Collection Time: 04/16/18 12:17 PM  Result Value Ref Range   Troponin I <0.03 <0.03 ng/mL  Type and screen [place]     Status: None   Collection Time: 04/16/18  1:31 PM  Result Value Ref Range   ABO/RH(D) A POS    Antibody Screen NEG    Sample Expiration      04/19/2018 Performed at [facility], [address] [place], [place] [address]   Glucose, capillary     Status: None   Collection Time: 04/16/18  3:06 PM  Result Value Ref Range   Glucose-Capillary 87 70 - 99 mg/dL  Protime-INR     Status: None   Collection Time: 04/16/18  3:29 PM  Result Value Ref Range   Prothrombin Time 14.7 11.4 - 15.2 seconds   INR 1.16   APTT     Status: None   Collection Time: 04/16/18  3:29 PM  Result Value Ref Range   aPTT 28 24 - 36 seconds   ____________________________________________  EKG My review and personal interpretation at Time: 11:53   Indication: syncope  Rate: 85  Rhythm: somis Axis: normal Other: normal intervals, no stemi ____________________________________________  RADIOLOGY  I personally reviewed all radiographic images ordered to evaluate for the above acute complaints and reviewed radiology reports and findings.  These findings were personally discussed with the patient.  Please see medical record for radiology report.  ____________________________________________   PROCEDURES  Procedure(s) performed:  .Critical Care Performed by: Merlyn Lot, MD Authorized by: Merlyn Lot, MD   Critical care provider statement:    Critical care time (minutes):  40    Critical care time was exclusive of:  Separately billable procedures and treating other patients   Critical care was necessary to treat or prevent imminent or life-threatening deterioration of the following conditions:  Metabolic crisis   Critical care was time spent personally by me on the following activities:  Development of treatment plan with patient or surrogate, discussions with consultants, evaluation of patient's response to treatment, examination of patient, obtaining history from patient or surrogate, ordering and performing treatments and interventions, ordering and review of laboratory studies, ordering and review of radiographic studies, pulse oximetry, re-evaluation of patient's condition and review of old charts      Critical Care performed: yes ____________________________________________   INITIAL IMPRESSION / ASSESSMENT AND PLAN / ED COURSE  Pertinent labs & imaging results that were available during my care of the patient were reviewed by me and considered in my medical decision making (see chart for details).   DDX: Dysrhythmia, anemia, lecture light abnormality, subdural, IPH, subarachnoid, contusion, seizure  Julie Holland is a 77 y.o. who presents to the ED with symptoms as described above.  She is afebrile Heema dynamically stable.  The patient will be placed on continuous pulse oximetry and telemetry for monitoring.  Laboratory evaluation will be sent to evaluate for the above complaints.  Clinical Course as of Apr 16 1802  Fri Apr 16, 2018  1328 Am shows guaiac negative stool but on further exam mentation does have very large tense bruise in the left gluteal muscle which may explain the patient's bleeding is going.  Will order CT scan to evaluate.   [PR]  1337 Patient syncopal episode likely secondary to acute anemia as she is dropped 3g and under 1 week.   [PR]  3074 Differential does show evidence of atypical lymphocytes given her symptoms I am concerned  for soft tissue malignancy or lymphoma.  Pathologist to review slide.   [PR]  1444 Pathology called to report the review does show evidence of blast crisis.  We will [place] base with oncology.   [PR]  1751 After extensive discussion with hematology oncology here with Dr. Rogue Bussing as well as Dr. Mike Gip have been in contact with [facility] regarding transfer for tertiary evaluation as the patient likely will need bone marrow biopsy and further diagnostic workup to determine best therapeutic plan.  Patient remains Heema dynamically stable.  She has been accepted to [facility].  Have discussed with the patient and available family all diagnostics and treatments performed thus far and all questions were answered to the best of my ability. The patient demonstrates understanding and agreement with plan.    [PR]    Clinical Course User Index [PR] Merlyn Lot, MD     As part of my medical decision making, I reviewed the following data within the [place] notes reviewed and incorporated, Labs reviewed, notes from prior ED visits and [place] Controlled Substance Database   ____________________________________________   FINAL CLINICAL IMPRESSION(S) / ED DIAGNOSES  Final diagnoses:  Acute anemia  Syncope and collapse      NEW MEDICATIONS STARTED DURING THIS VISIT:  New Prescriptions   No medications on file     Note:  This document was prepared using Dragon voice recognition software and may include unintentional dictation errors.    Merlyn Lot, MD 04/16/18 [phone]

## 2018-04-16 NOTE — ED Notes (Signed)
EMTALA reviewed. 

## 2018-04-16 NOTE — ED Notes (Signed)
[facility]  TRANSFER  C[facility]  CALLED  PER  DR  Quentin Cornwall  MD

## 2018-04-16 NOTE — ED Notes (Signed)
Pt to CT

## 2018-04-16 NOTE — ED Notes (Signed)
XRAY  POWERSHARE  WITH  [facility]  AND  COPY  SENT  WITH  PT

## 2018-04-17 ENCOUNTER — Encounter: Payer: Self-pay | Admitting: Pathology

## 2018-04-18 DIAGNOSIS — R109 Unspecified abdominal pain: Principal | ICD-10-CM

## 2018-04-19 ENCOUNTER — Other Ambulatory Visit: Payer: Self-pay | Admitting: Family Medicine

## 2018-04-19 DIAGNOSIS — R109 Unspecified abdominal pain: Principal | ICD-10-CM

## 2018-04-19 DIAGNOSIS — E785 Hyperlipidemia, unspecified: Secondary | ICD-10-CM

## 2018-04-19 MED ORDER — VENETOCLAX 100 MG TABLET
ORAL_TABLET | Freq: Every day | ORAL | 0 refills | 0.00000 days
Start: 2018-04-19 — End: 2018-04-19

## 2018-04-19 NOTE — Telephone Encounter (Signed)
Can stop keflex F/u at next appt

## 2018-04-20 ENCOUNTER — Telehealth: Payer: Self-pay | Admitting: General Surgery
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
## 2018-04-20 NOTE — Telephone Encounter (Signed)
Attempted to call patient 3 times - first 2 calls were dropped, 3rd went to voicemail.  RN relayed Dr Algis Downs advise, asked patient to call back and let us know how she was feeling.

## 2018-04-20 NOTE — Telephone Encounter (Signed)
Mr Forni called to let us know Julie Holland is currently an inpatient at [facility]. She has been diagnosed with Leukemia.

## 2018-04-21 MED ORDER — POSACONAZOLE 100 MG TABLET,DELAYED RELEASE
ORAL_TABLET | Freq: Every day | ORAL | 0 refills | 0.00000 days
Start: 2018-04-21 — End: 2018-04-21

## 2018-04-21 MED ORDER — VENETOCLAX 100 MG TABLET
ORAL_TABLET | Freq: Every day | ORAL | 0 refills | 0 days
Start: 2018-04-21 — End: 2018-04-21
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
## 2018-04-22 ENCOUNTER — Other Ambulatory Visit: Payer: Medicare Other | Admitting: General Surgery

## 2018-04-22 LAB — CBC W/ AUTO DIFF
HEMATOCRIT: 23.6 % — ABNORMAL LOW (ref 36.0–46.0)
HEMOGLOBIN: 7.9 g/dL — ABNORMAL LOW (ref 12.0–16.0)
MEAN CORPUSCULAR HEMOGLOBIN CONC: 33.5 g/dL (ref 31.0–37.0)
MEAN CORPUSCULAR VOLUME: 89.2 fL (ref 80.0–100.0)
MEAN PLATELET VOLUME: 12.1 fL — ABNORMAL HIGH (ref 7.0–10.0)
NUCLEATED RED BLOOD CELLS: 8 /100{WBCs} — ABNORMAL HIGH (ref <=4)
PLATELET COUNT: 81 10*9/L — ABNORMAL LOW (ref 150–440)
RED CELL DISTRIBUTION WIDTH: 21 % — ABNORMAL HIGH (ref 12.0–15.0)
WBC ADJUSTED: 20.6 10*9/L — ABNORMAL HIGH (ref 4.5–11.0)

## 2018-04-22 LAB — BASIC METABOLIC PANEL
BLOOD UREA NITROGEN: 14 mg/dL (ref 7–21)
BUN / CREAT RATIO: 36
CHLORIDE: 100 mmol/L (ref 98–107)
CO2: 30 mmol/L (ref 22.0–30.0)
CREATININE: 0.39 mg/dL — ABNORMAL LOW (ref 0.60–1.00)
EGFR CKD-EPI AA FEMALE: >90 mL/min/{1.73_m2} (ref >=60)
EGFR CKD-EPI NON-AA FEMALE: >90 mL/min/{1.73_m2} (ref >=60)
GLUCOSE RANDOM: 132 mg/dL (ref 65–179)
POTASSIUM: 3.8 mmol/L (ref 3.5–5.0)
SODIUM: 131 mmol/L — ABNORMAL LOW (ref 135–145)

## 2018-04-22 LAB — MANUAL DIFFERENTIAL
BASOPHILS - ABS (DIFF): 0.2 10*9/L — ABNORMAL HIGH (ref 0.0–0.1)
BASOPHILS - REL (DIFF): 1 %
EOSINOPHILS - ABS (DIFF): 1.4 10*9/L — ABNORMAL HIGH (ref 0.0–0.4)
LYMPHOCYTES - ABS (DIFF): 2.1 10*9/L (ref 1.5–5.0)
LYMPHOCYTES - REL (DIFF): 10 %
MONOCYTES - ABS (DIFF): 0 10*9/L — ABNORMAL LOW (ref 0.2–0.8)
MONOCYTES - REL (DIFF): 0 %
NEUTROPHILS - ABS (DIFF): 2.7 10*9/L (ref 2.0–7.5)
NEUTROPHILS - REL (DIFF): 13 %

## 2018-04-22 LAB — CREATININE: Creatinine:MCnc:Pt:Ser/Plas:Qn:: 0.39 — ABNORMAL LOW

## 2018-04-22 LAB — FIBRINOGEN LEVEL: Lab: 331

## 2018-04-22 LAB — COLLECTION

## 2018-04-22 LAB — URIC ACID: Urate:MCnc:Pt:Ser/Plas:Qn:: 1.9 — ABNORMAL LOW

## 2018-04-22 LAB — D-DIMER QUANTITATIVE (CH,ML,PD,ET): Lab: 7273 — ABNORMAL HIGH

## 2018-04-22 LAB — MAGNESIUM: Magnesium:MCnc:Pt:Ser/Plas:Qn:: 1.5 — ABNORMAL LOW

## 2018-04-22 LAB — PROTIME-INR: INR: 1.08

## 2018-04-22 LAB — HEPARIN CORRELATION: Lab: 0.2

## 2018-04-22 LAB — MYELOID MUTATION PANEL - AML WITH FLT3 TESTING

## 2018-04-22 LAB — HEMATOCRIT: Lab: 23.6 — ABNORMAL LOW

## 2018-04-22 LAB — PHOSPHORUS: Phosphate:MCnc:Pt:Ser/Plas:Qn:: 3.5

## 2018-04-22 LAB — BASOPHILS - REL (DIFF): Lab: 1

## 2018-04-22 LAB — LACTATE DEHYDROGENASE: Lactate dehydrogenase:CCnc:Pt:Ser/Plas:Qn:: 1216 — ABNORMAL HIGH

## 2018-04-22 LAB — PROTIME: Lab: 12.3

## 2018-04-22 MED ORDER — VENETOCLAX 100 MG TABLET
ORAL_TABLET | Freq: Every day | ORAL | 0 refills | 0 days | Status: CP
Start: 2018-04-22 — End: 2018-05-24
  Filled 2018-04-29: qty 120, 30d supply, fill #0

## 2018-04-22 NOTE — Unmapped (Signed)
[facility] Specialty Medication Referral: PA APPROVED    Medication (Brand/Generic): Venclexta 100mg     Initial Test Claim completed with resulted information below:  No PA required  Patient ABLE to fill at [facility]  Insurance Company:  [employer]   Anticipated Copay: $0  Is anticipated copay with a copay card or grant? YES (03/23/18 TO 03/23/2019) [facility], Amount granted: $10,000)     As Co-pay is under $100 defined limit, per policy there will be no further investigation of need for financial assistance at this time unless patient requests. This referral has been communicated to the provider and handed off to the [facility] team for further processing and filling of prescribed medication.   ______________________________________________________________________  Please utilize this referral for viewing purposes as it will serve as the central location for all relevant documentation and updates.

## 2018-04-22 NOTE — Unmapped (Signed)
Daily Progress Note    24hr Events: No acute events overnight. Awaiting MLL FISH which will reveal targetable mutations and aid with determining prognosis of AML. WBC improved from 29.1 to 20.6 today. Hydrea increased to 2g TID. Plan to initiate aza/venetoclex therapy once white count decreases to below 10.     *BEACON consult placed 08/12 (see consult note)     Assessment/Plan:  Tracey Lang is a 77 y.o. 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     Principal Problem:    Acute myeloid leukemia (CMS-HCC)  Active Problems:    Thrombocytopenia (CMS-HCC)    Type 2 diabetes mellitus, without long-term current use of insulin (CMS-HCC)    Essential hypertension    Hyperlipidemia    GERD (gastroesophageal reflux disease)  Resolved Problems:    * No resolved hospital problems. *    #Acute Myeloid Leukemia: On admission to [facility], WBC 19.8, hgb 6.1, and platelets 135. No current evidence of tumor lysis or DIC. Patient has had intermittent thrombocytopenia and leukopenia in the past with normal SPEP and free light chains 11/18. The differential per her primary oncologist was ITP vs underlying myelodysplastic syndrome though has never had a bone marrow biopsy per patient preference. Peripheral blood smear on 8/10 showing numerous blasts. 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 ECOG1 per our assessment. Discussed BEAT AML trial and patient and husband do not wish to meet with coordinator or participate in trial. 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 Plan to begin azacitadine/venetoclax therapy following insurance approval and white count <10.    - Increase Hydrea dosing to 2000mg  TID, continue to monitor white count and titrate up as needed   - Bone marrow biopsy conducted 8/12 - prelim. c/w AML, cytogenetics demonstrate normal karyotype on first 10 metaphases, awaiting MLL FISH results which will reveal targetable mutations and aid with determining prognosis of disease.  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 Was admitted to [facility] 6/5-6/8, MRI of the right wrist showed an abscess and patient underwent I&D. Blood cultures were positive for MSSA. TTE was negative for valvular vegetations and repeat blood cultures were negative on 02/12/18. Patient was started on IV ancef through 7/22, then transitioned to PO keflex which patient did not tolerate and discontinued on her own on 8/08. Pt denies pain and has active range of motion in right wrist. No evidence of active infection, however, was documented to be erythematous in ED. Will be important to have complete clearance of infection prior to starting treatment. Other ddx include neutrophilic dermatosis, myeloid sarcoma etc. 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   - IV ancef discontinued following MRI results (8/10 - 8/12)    #HTN: BP has been well controlled. Home coreg 6.25 mg discontinued - Venetoclax is a p-glycoprotein substrate while carvedilol is a p-gp inhibitor so Venetoclax dosing would need to be reduced in patients with AML if concomitantly using carvedilol.  - Start home lisinopril 10 mg daily, continue to monitor BP and titrate up as needed  - Discontinue home coreg 6.25mg  BID  ??  #HLD:  - Hold home simvastatin   ??  #T2DM: BG well controlled on admission but elevated more recently - BG of 263 on 8/15, will CTM and consider adding lantus if necessary  - Fructosamine within range at 228 indicating good short term control of BG  - Hold home metformin   - SSI    #Asthma  - Continue home advair and albuterol nebs prn    FEN: General diet  GI ppx: Not indicated  DVT ppx: SCDs  Code Status: Full Code, confirmed with patient on admission  Dispo: Floor, E1 admit  ________________________________________________________________    Subjective:  Pt reports she slept well overnight. Healthy appetite, seen eating breakfast this morning. Tylenol administered for left hip pain. Bruising improved on exam this AM.     Labs/Studies:  Labs and Studies from the last 24hrs per EMR and Reviewed and   All lab results last 24 hours:    Recent Results (from the past 24 hour(s))   POCT Glucose    Collection Time: 04/21/18  4:58 PM   Result Value Ref Range    Glucose, POC 152 65 - 179 mg/dL   Basic Metabolic Panel    Collection Time: 04/21/18  5:45 PM   Result Value Ref Range    Sodium 130 (L) 135 - 145 mmol/L    Potassium 4.2 3.5 - 5.0 mmol/L    Chloride 100 98 - 107 mmol/L    CO2 29.0 22.0 - 30.0 mmol/L    Anion Gap 1 (L) 9 - 15 mmol/L    BUN 17 7 - 21 mg/dL    Creatinine 4.69 (L) 0.60 - 1.00 mg/dL    BUN/Creatinine Ratio 40     EGFR CKD-EPI Non-African American, Female >90 >=60 mL/min/1.6m2    EGFR CKD-EPI African American, Female >90 >=60 mL/min/1.73m2    Glucose 183 (H) 65 - 179 mg/dL    Calcium 7.7 (L) 8.5 - 10.2 mg/dL   Magnesium Level    Collection Time: 04/21/18  5:45 PM   Result Value Ref Range    Magnesium 1.4 (L) 1.6 - 2.2 mg/dL   Phosphorus Level    Collection Time: 04/21/18  5:45 PM Result Value Ref Range    Phosphorus 3.4 2.9 - 4.7 mg/dL   CBC w/ Differential    Collection Time: 04/21/18  5:45 PM   Result Value Ref Range    WBC 25.6 (H) 4.5 - 11.0 10*9/L    RBC 2.90 (L) 4.00 - 5.20 10*12/L    HGB 8.5 (L) 12.0 - 16.0 g/dL    HCT 62.9 (L) 52.8 - 46.0 %    MCV 89.9 80.0 - 100.0 fL    MCH 29.3 26.0 - 34.0 pg    MCHC 32.6 31.0 - 37.0 g/dL    RDW 41.3 (H) 24.4 - 15.0 %    MPV 12.1 (H) 7.0 - 10.0 fL    Platelet 95 (L) 150 - 440  10*9/L    nRBC 12 (H) <=4 /100 WBCs    Variable HGB Concentration Slight (A) Not Present    Microcytosis Slight (A) Not Present    Macrocytosis Slight (A) Not Present    Anisocytosis Moderate (A) Not Present    Hypochromasia Moderate (A) Not Present   Manual Differential    Collection Time: 04/21/18  5:45 PM   Result Value Ref Range    Neutrophils % 26 %    Lymphocytes % 11 %    Monocytes % 6 %    Eosinophils % 8 %    Basophils % 1 %    Blasts % 48 (HH) <=0 %    Absolute Neutrophils 6.7 2.0 - 7.5 10*9/L    Absolute Lymphocytes 2.8 1.5 - 5.0 10*9/L    Absolute Monocytes 1.5 (H) 0.2 - 0.8 10*9/L    Absolute Eosinophils 2.0 (H) 0.0 - 0.4 10*9/L    Absolute Basophils 0.3 (H) 0.0 - 0.1 10*9/L    Smear Review Comments  Undefined    Giant Platelets Present (A) Not Present    Polychromasia Slight (A) Not Present   Prepare RBC    Collection Time: 04/21/18  7:02 PM   Result Value Ref Range    Crossmatch Compatible     Unit Blood Type A Pos     ISBT Number 6200     Unit # [id number]     Status Issued     Spec Expiration [id number]     Product ID Red Blood Cells     PRODUCT CODE [id number]     Crossmatch Compatible     Unit Blood Type A Pos     ISBT Number 6200     Unit # [id number]     Status Transfused     Product ID Red Blood Cells     PRODUCT CODE [id number]    POCT Glucose    Collection Time: 04/21/18  8:34 PM   Result Value Ref Range    Glucose, POC 211 (H) 65 - 179 mg/dL   Basic Metabolic Panel    Collection Time: 04/22/18  6:18 AM   Result Value Ref Range    Sodium 131 (L) 135 - 145 mmol/L    Potassium 3.8 3.5 - 5.0 mmol/L    Chloride 100 98 - 107 mmol/L    CO2 30.0 22.0 - 30.0 mmol/L    Anion Gap 1 (L) 9 - 15 mmol/L    BUN 14 7 - 21 mg/dL    Creatinine 2.84 (L) 0.60 - 1.00 mg/dL    BUN/Creatinine Ratio 36     EGFR CKD-EPI Non-African American, Female >90 >=60 mL/min/1.26m2    EGFR CKD-EPI African American, Female >90 >=60 mL/min/1.66m2    Glucose 132 65 - 179 mg/dL    Calcium 8.1 (L) 8.5 - 10.2 mg/dL   Phosphorus Level    Collection Time: 04/22/18  6:18 AM   Result Value Ref Range    Phosphorus 3.5 2.9 - 4.7 mg/dL   Magnesium Level    Collection Time: 04/22/18  6:18 AM   Result Value Ref Range    Magnesium 1.5 (L) 1.6 - 2.2 mg/dL   Lactate dehydrogenase    Collection Time: 04/22/18  6:18 AM   Result Value Ref Range    LDH 1,216 (H) 338 - 610 U/L   Uric acid    Collection Time: 04/22/18  6:18 AM   Result Value Ref Range    Uric Acid 1.9 (L) 3.0 -  6.5 mg/dL   CBC w/ Differential    Collection Time: 04/22/18  6:18 AM   Result Value Ref Range    WBC 20.6 (H) 4.5 - 11.0 10*9/L    RBC 2.65 (L) 4.00 - 5.20 10*12/L    HGB 7.9 (L) 12.0 - 16.0 g/dL    HCT 19.1 (L) 47.8 - 46.0 %    MCV 89.2 80.0 - 100.0 fL    MCH 29.9 26.0 - 34.0 pg    MCHC 33.5 31.0 - 37.0 g/dL    RDW 29.5 (H) 62.1 - 15.0 %    MPV 12.1 (H) 7.0 - 10.0 fL    Platelet 81 (L) 150 - 440 10*9/L    nRBC 8 (H) <=4 /100 WBCs    Variable HGB Concentration Slight (A) Not Present    Microcytosis Slight (A) Not Present    Macrocytosis Slight (A) Not Present    Anisocytosis Moderate (A) Not Present    Hypochromasia Slight (A) Not Present   aPTT    Collection Time: 04/22/18  6:18 AM   Result Value Ref Range    APTT 33.2 25.9 - 39.5 sec    Heparin Correlation 0.2    PT-INR    Collection Time: 04/22/18  6:18 AM   Result Value Ref Range    PT 12.3 10.2 - 12.8 sec    INR 1.08    Fibrinogen    Collection Time: 04/22/18  6:18 AM   Result Value Ref Range    Fibrinogen 331 177 - 386 mg/dL   D-Dimer, Quantitative    Collection Time: 04/22/18  6:18 AM Result Value Ref Range    D-Dimer 7,273 (H) <230 ng/mL DDU   Manual Differential    Collection Time: 04/22/18  6:18 AM   Result Value Ref Range    Neutrophils % 13 %    Lymphocytes % 10 %    Monocytes % 0 %    Eosinophils % 7 %    Basophils % 1 %    Blasts % 69 (HH) <=0 %    Absolute Neutrophils 2.7 2.0 - 7.5 10*9/L    Absolute Lymphocytes 2.1 1.5 - 5.0 10*9/L    Absolute Monocytes 0.0 (L) 0.2 - 0.8 10*9/L    Absolute Eosinophils 1.4 (H) 0.0 - 0.4 10*9/L    Absolute Basophils 0.2 (H) 0.0 - 0.1 10*9/L    Smear Review Comments See Comment (A) Undefined   POCT Glucose    Collection Time: 04/22/18  7:19 AM   Result Value Ref Range    Glucose, POC 140 65 - 179 mg/dL   POCT Glucose    Collection Time: 04/22/18 12:07 PM   Result Value Ref Range    Glucose, POC 263 (H) 65 - 179 mg/dL     Objective:  Temp:  [36.2 ??C-37 ??C] (P) 36.9 ??C  Heart Rate:  [84-97] (P) 97  Resp:  [16-20] (P) 18  BP: (116-153)/(56-86) (P) 116/70  SpO2:  [96 %-100 %] (P) 98 %    General: in no acute distress, resting in bed  HEENT: No scleral icterus. Oropharynx moist.   RESP: CTAB, no increased work of breathing on room air  CV: Regular rate and rhythm. Normal S1 and S2.   ABD: NABS, soft, non-distended, non-tender to palpation  EXT: No lower extremity edema, cyanosis, or clubbing.   MSK: normal range of motion. Minimal swelling around base of thumb, non-tender to palpation, no erythema.   SKIN: large superficial ecchymosis on left lateral  thigh and left gluteal region improving, no evidence of hematoma  NEURO: No focal neurologic deficits, A/Ox3

## 2018-04-22 NOTE — Unmapped (Signed)
Per test claim for Venclexta at the [facility], patient needs Medication Assistance Program for High Copay.    Inpatient - onboarding should be sent to [facility] Pharmacist once  copay assistance is approved

## 2018-04-22 NOTE — Unmapped (Signed)
Problem: Adult Inpatient Plan of Care  Goal: Plan of Care Review  Outcome: Ongoing - Unchanged  Goal: Patient-Specific Goal (Individualization)  Outcome: Ongoing - Unchanged  Goal: Absence of Hospital-Acquired Illness or Injury  Outcome: Ongoing - Unchanged  Goal: Optimal Comfort and Wellbeing  Outcome: Ongoing - Unchanged  Goal: Readiness for Transition of Care  Outcome: Ongoing - Unchanged  Goal: Rounds/Family Conference  Outcome: Ongoing - Unchanged     Problem: Fall Injury Risk  Goal: Absence of Fall and Fall-Related Injury  Outcome: Ongoing - Unchanged     Problem: Self-Care Deficit  Goal: Improved Ability to Complete Activities of Daily Living  Outcome: Ongoing - Unchanged     Problem: Diabetes Comorbidity  Goal: Blood Glucose Level Within Desired Range  Outcome: Ongoing - Unchanged     Problem: Hypertension Comorbidity  Goal: Blood Pressure in Desired Range  Outcome: Ongoing - Unchanged     Problem: Coping Ineffective (Oncology Care)  Goal: Effective Coping  Outcome: Ongoing - Unchanged     Problem: Fatigue (Oncology Care)  Goal: Improved Activity Tolerance  Outcome: Ongoing - Unchanged

## 2018-04-23 LAB — CBC W/ AUTO DIFF
HEMATOCRIT: 22.8 % — ABNORMAL LOW (ref 36.0–46.0)
HEMOGLOBIN: 7.5 g/dL — ABNORMAL LOW (ref 12.0–16.0)
MEAN CORPUSCULAR HEMOGLOBIN CONC: 32.7 g/dL (ref 31.0–37.0)
MEAN CORPUSCULAR HEMOGLOBIN: 29.9 pg (ref 26.0–34.0)
MEAN PLATELET VOLUME: 12.2 fL — ABNORMAL HIGH (ref 7.0–10.0)
NUCLEATED RED BLOOD CELLS: 14 /100{WBCs} — ABNORMAL HIGH (ref <=4)
PLATELET COUNT: 83 10*9/L — ABNORMAL LOW (ref 150–440)
RED BLOOD CELL COUNT: 2.5 10*12/L — ABNORMAL LOW (ref 4.00–5.20)
WBC ADJUSTED: 9.8 10*9/L (ref 4.5–11.0)

## 2018-04-23 LAB — BASIC METABOLIC PANEL
ANION GAP: 6 mmol/L — ABNORMAL LOW (ref 9–15)
BLOOD UREA NITROGEN: 16 mg/dL (ref 7–21)
BLOOD UREA NITROGEN: 17 mg/dL (ref 7–21)
BUN / CREAT RATIO: 43
BUN / CREAT RATIO: 40
CALCIUM: 8.3 mg/dL — ABNORMAL LOW (ref 8.5–10.2)
CALCIUM: 8.5 mg/dL (ref 8.5–10.2)
CHLORIDE: 97 mmol/L — ABNORMAL LOW (ref 98–107)
CHLORIDE: 93 mmol/L — ABNORMAL LOW (ref 98–107)
CO2: 28 mmol/L (ref 22.0–30.0)
CREATININE: 0.37 mg/dL — ABNORMAL LOW (ref 0.60–1.00)
CREATININE: 0.43 mg/dL — ABNORMAL LOW (ref 0.60–1.00)
EGFR CKD-EPI AA FEMALE: >90 mL/min/{1.73_m2} (ref >=60)
EGFR CKD-EPI AA FEMALE: >90 mL/min/{1.73_m2} (ref >=60)
EGFR CKD-EPI NON-AA FEMALE: >90 mL/min/{1.73_m2} (ref >=60)
EGFR CKD-EPI NON-AA FEMALE: >90 mL/min/{1.73_m2} (ref >=60)
GLUCOSE RANDOM: 146 mg/dL (ref 65–179)
POTASSIUM: 4 mmol/L (ref 3.5–5.0)
POTASSIUM: 3.8 mmol/L (ref 3.5–5.0)
SODIUM: 131 mmol/L — ABNORMAL LOW (ref 135–145)
SODIUM: 130 mmol/L — ABNORMAL LOW (ref 135–145)

## 2018-04-23 LAB — MANUAL DIFFERENTIAL
BASOPHILS - ABS (DIFF): 0.3 10*9/L — ABNORMAL HIGH (ref 0.0–0.1)
BASOPHILS - REL (DIFF): 3 %
BLASTS - REL (DIFF): 61 % (ref <=0)
EOSINOPHILS - REL (DIFF): 8 %
LYMPHOCYTES - ABS (DIFF): 1.1 10*9/L — ABNORMAL LOW (ref 1.5–5.0)
LYMPHOCYTES - REL (DIFF): 11 %
MONOCYTES - ABS (DIFF): 0 10*9/L — ABNORMAL LOW (ref 0.2–0.8)
MONOCYTES - REL (DIFF): 0 %
NEUTROPHILS - ABS (DIFF): 1.7 10*9/L — ABNORMAL LOW (ref 2.0–7.5)

## 2018-04-23 LAB — MAGNESIUM: Magnesium:MCnc:Pt:Ser/Plas:Qn:: 1.6

## 2018-04-23 LAB — PHOSPHORUS
Phosphate:MCnc:Pt:Ser/Plas:Qn:: 3.4
Phosphate:MCnc:Pt:Ser/Plas:Qn:: 3.6

## 2018-04-23 LAB — HEPATIC FUNCTION PANEL
ALBUMIN: 2.9 g/dL — ABNORMAL LOW (ref 3.5–5.0)
ALKALINE PHOSPHATASE: 90 U/L (ref 38–126)
ALT (SGPT): 20 U/L (ref 15–48)
BILIRUBIN TOTAL: 1.2 mg/dL (ref 0.0–1.2)

## 2018-04-23 LAB — BLOOD UREA NITROGEN: Urea nitrogen:MCnc:Pt:Ser/Plas:Qn:: 17

## 2018-04-23 LAB — CALCIUM: Calcium:MCnc:Pt:Ser/Plas:Qn:: 8.3 — ABNORMAL LOW

## 2018-04-23 LAB — PROTIME-INR: INR: 1.03

## 2018-04-23 LAB — LACTATE DEHYDROGENASE
Lactate dehydrogenase:CCnc:Pt:Ser/Plas:Qn:: 1492 — ABNORMAL HIGH
Lactate dehydrogenase:CCnc:Pt:Ser/Plas:Qn:: 1696 — ABNORMAL HIGH

## 2018-04-23 LAB — FIBRINOGEN LEVEL: Lab: 376

## 2018-04-23 LAB — PROTIME: Lab: 11.7

## 2018-04-23 LAB — MEAN CORPUSCULAR HEMOGLOBIN: Lab: 29.9

## 2018-04-23 LAB — URIC ACID
Urate:MCnc:Pt:Ser/Plas:Qn:: 1.5 — ABNORMAL LOW
Urate:MCnc:Pt:Ser/Plas:Qn:: 2.1 — ABNORMAL LOW

## 2018-04-23 LAB — D-DIMER QUANTITATIVE (CH,ML,PD,ET): Lab: 9446 — ABNORMAL HIGH

## 2018-04-23 LAB — LYMPHOCYTES - ABS (DIFF): Lab: 1.1 — ABNORMAL LOW

## 2018-04-23 LAB — APTT: Coagulation surface induced:Time:Pt:PPP:Qn:Coag: 31.8

## 2018-04-23 LAB — PROTEIN TOTAL: Protein:MCnc:Pt:Ser/Plas:Qn:: 6.6

## 2018-04-23 MED ORDER — POSACONAZOLE 100 MG TABLET,DELAYED RELEASE
ORAL_TABLET | Freq: Every day | ORAL | 0 refills | 0 days
Start: 2018-04-23 — End: 2018-04-23

## 2018-04-23 NOTE — Unmapped (Signed)
Problem: Adult Inpatient Plan of Care  Goal: Plan of Care Review  Outcome: Ongoing - Unchanged  Goal: Patient-Specific Goal (Individualization)  Outcome: Ongoing - Unchanged  Goal: Absence of Hospital-Acquired Illness or Injury  Outcome: Ongoing - Unchanged  Goal: Optimal Comfort and Wellbeing  Outcome: Ongoing - Unchanged  Goal: Readiness for Transition of Care  Outcome: Ongoing - Unchanged  Goal: Rounds/Family Conference  Outcome: Ongoing - Unchanged     Problem: Fall Injury Risk  Goal: Absence of Fall and Fall-Related Injury  Outcome: Ongoing - Unchanged     Problem: Self-Care Deficit  Goal: Improved Ability to Complete Activities of Daily Living  Outcome: Ongoing - Unchanged     Problem: Diabetes Comorbidity  Goal: Blood Glucose Level Within Desired Range  Outcome: Ongoing - Unchanged     Problem: Hypertension Comorbidity  Goal: Blood Pressure in Desired Range  Outcome: Ongoing - Unchanged     Problem: Coping Ineffective (Oncology Care)  Goal: Effective Coping  Outcome: Ongoing - Unchanged     Problem: Fatigue (Oncology Care)  Goal: Improved Activity Tolerance  Outcome: Ongoing - Unchanged

## 2018-04-23 NOTE — Unmapped (Addendum)
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    Acute Myeloid Leukemia:   On admission to [facility], WBC 19.8, Hgb 6.1, and platelets 135 with no evidence of tumor lysis or DIC.??Patient has had intermittent thrombocytopenia and leukopenia in the past with normal SPEP and free light chains (11/18). Peripheral blood smear done on 8/10 showing numerous blasts. 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 Bone marrow biopsy was conducted on 8/12 and resulted consistent with AML. Cytogenetics demonstrate normal karyotype on first 10 metaphases and MLL FISH revealed normal karyotype with no MLL rearrangement. Per assessment, ECOG1. Patient declined participation in BEAT AML trial. TTE demonstrated normal left and right ventricular systolic function, ejection fraction > 55%, grade II diastolic dysfunction, degenerative mitral valve disease with mild MR, aortic sclerosis and mildly dilated left atrium. She was started on a chemotherapy regimen of AZA/Venetoclax for a 7 day inpatient course (8/16 - 8/22) after WBC count fell <10. She was monitored for TLS and labs were unremarkable. 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 Transfusion guidelines were established as follows: Hgb <7, platelets <10.   ??  HTN:   Patient's home Coreg 6.25 mg was discontinued in setting of initiation of Venetoclax (given that Coreg interferes with Venetoclax dosing). Instead, she was continued on her home lisinopril 10mg  QD started on amlodipine 5mg  QD, which was effective in controlling blood pressure.  ??  HLD:  Her home simvastatin was held throughout admission.  ??  T2DM:   BG well controlled on admission but elevated more recently. Patient placed on standing Lantus in setting of poor control with good effect. Home metformin, the patient's sole home diabetes medication, was held throughout hospitalization.     Right Wrist Pain, Erythema, resolved:   Patient had a fall in early June with fracture of right wrist. Was admitted to [facility] 6/5-6/8, MRI of the right wrist showed an abscess and patient underwent I&D. Blood cultures were positive for MSSA. TTE was negative for valvular vegetations and repeat blood cultures were negative on 02/12/18. Patient was started on IV ancef through 7/22, then transitioned to PO keflex which patient did not tolerate and discontinued on her own on 8/08. Pt denies pain and has active range of motion in right wrist. No evidence of active infection, however, was documented to be erythematous in ED. MRI R wrist was obtained on 8/12 and showed no evidence for soft tissue swelling or drainable fluid collection, suggesting pain is secondary to TFCC tear/degeneration. IV ancef was restarted on admission, but discontinued following MRI results (8/10 - 8/12).

## 2018-04-23 NOTE — Unmapped (Signed)
Daily Progress Note    24hr Events: No acute events overnight. Awaiting MLL FISH which will reveal targetable mutations and aid with determining prognosis of AML. Increased Hydrea to 2g due to increased WBC count (improved to less than 10 this morning). She notes she is still feeling well this morning and has no new complaints.     Assessment/Plan:  Tracey Lang is a 77 y.o. 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     Principal Problem:    Acute myeloid leukemia (CMS-HCC)  Active Problems:    Thrombocytopenia (CMS-HCC)    Type 2 diabetes mellitus, without long-term current use of insulin (CMS-HCC)    Essential hypertension    Hyperlipidemia    GERD (gastroesophageal reflux disease)  Resolved Problems:    * No resolved hospital problems. *    #Acute Myeloid Leukemia: On admission to [facility], WBC 19.8, hgb 6.1, and platelets 135. No current evidence of tumor lysis or DIC. Patient has had intermittent thrombocytopenia and leukopenia in the past with normal SPEP and free light chains 11/18. The differential per her primary oncologist was ITP vs underlying myelodysplastic syndrome though has never had a bone marrow biopsy per patient preference. Peripheral blood smear on 8/10 showing numerous blasts. 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 ECOG1 per our assessment. Discussed BEAT AML trial and patient and husband do not wish to meet with coordinator or participate in trial. 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 Starting chemo today as WBC count has appropriately fallen.   - Stop Hydrea today   - Start aza/venetoclax today (C1D21=8/16)  - Increase TLS labs to BID for now  - Start PPx when appropriate  - Bone marrow biopsy conducted 8/12 - prelim. c/w AML, cytogenetics demonstrate normal karyotype on first 10 metaphases, awaiting MLL FISH results which will reveal targetable mutations and aid with determining prognosis of disease.  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 Home coreg 6.25 mg discontinued - Venetoclax is a p-glycoprotein substrate while carvedilol is a p-gp inhibitor so Venetoclax dosing would need to be reduced in patients with AML if concomitantly using carvedilol.  - home lisinopril 10 mg daily, continue to monitor BP and titrate up as needed  - add CCB as needed; will avoid increasing lisinopril for now given concern for TLS and hyperK  ??  #HLD:  - Hold home simvastatin   ??  #T2DM: BG well controlled on admission but elevated more recently - BG of 263 on 8/15, will CTM and consider adding lantus if necessary  - Hold home metformin   - SSI; add long acting as necessary    #Asthma  - Continue home advair and albuterol nebs prn    FEN: General diet  GI ppx: Not indicated  DVT ppx: SCDs  Code Status: Full Code, confirmed with patient on  admission  Dispo: Floor, E1 admit  ________________________________________________________________    Subjective:  No new complaints today. Bruising is improved. I called and updated husband on plan of care as well.     Labs/Studies:  Labs and Studies from the last 24hrs per EMR and Reviewed and   All lab results last 24 hours:    Recent Results (from the past 24 hour(s))   POCT Glucose    Collection Time: 04/22/18  4:11 PM   Result Value Ref Range    Glucose, POC 97 65 - 179 mg/dL   Prepare RBC    Collection Time: 04/22/18  7:01 PM   Result Value Ref Range    Crossmatch Compatible     Unit Blood Type A Pos     ISBT Number 6200     Unit # [id number]     Status Transfused     Product ID Red Blood Cells     PRODUCT CODE [id number]     Crossmatch Compatible     Unit Blood Type A Pos     ISBT Number 6200     Unit # [id number]     Status Transfused     Product ID Red Blood Cells     PRODUCT CODE [id number]    POCT Glucose    Collection Time: 04/22/18  8:27 PM   Result Value Ref Range    Glucose, POC 287 (H) 65 - 179 mg/dL   Basic Metabolic Panel    Collection Time: 04/23/18  5:37 AM   Result Value Ref Range    Sodium 131 (L) 135 - 145 mmol/L    Potassium 4.0 3.5 - 5.0 mmol/L    Chloride 97 (L) 98 - 107 mmol/L    CO2 28.0 22.0 - 30.0 mmol/L    Anion Gap 6 (L) 9 - 15 mmol/L    BUN 16 7 - 21 mg/dL    Creatinine 3.08 (L) 0.60 - 1.00 mg/dL    BUN/Creatinine Ratio 43     EGFR CKD-EPI Non-African American, Female >90 >=60 mL/min/1.86m2    EGFR CKD-EPI African American, Female >90 >=60 mL/min/1.25m2    Glucose 129 65 - 179 mg/dL    Calcium 8.3 (L) 8.5 - 10.2 mg/dL   Phosphorus Level    Collection Time: 04/23/18  5:37 AM   Result Value Ref Range    Phosphorus 3.6 2.9 - 4.7 mg/dL   Magnesium Level    Collection Time: 04/23/18  5:37 AM   Result Value Ref Range    Magnesium 1.6 1.6 - 2.2 mg/dL   Lactate dehydrogenase    Collection Time: 04/23/18  5:37 AM   Result Value Ref Range    LDH 1,492 (H) 338 - 610 U/L   Uric acid    Collection Time: 04/23/18  5:37 AM   Result Value Ref Range    Uric Acid 2.1 (L) 3.0 - 6.5 mg/dL   Hepatic Function Panel    Collection Time: 04/23/18  5:37 AM   Result Value Ref Range    Albumin 2.9 (L) 3.5 - 5.0 g/dL    Total Protein 6.6 6.5 - 8.3 g/dL    Total Bilirubin 1.2 0.0 - 1.2 mg/dL    Bilirubin, Direct <6.57 0.00 - 0.40 mg/dL    AST 40 (H) 14 - 38 U/L    ALT 20 15 - 48 U/L    Alkaline Phosphatase 90 38 - 126 U/L   CBC w/ Differential    Collection Time: 04/23/18  5:37  AM   Result Value Ref Range    WBC 9.8 4.5 - 11.0 10*9/L    RBC 2.50 (L) 4.00 - 5.20 10*12/L    HGB 7.5 (L) 12.0 - 16.0 g/dL    HCT 16.1 (L) 09.6 - 46.0 %    MCV 91.4 80.0 - 100.0 fL    MCH 29.9 26.0 - 34.0 pg    MCHC 32.7 31.0 - 37.0 g/dL    RDW 04.5 (H) 40.9 - 15.0 %    MPV 12.2 (H) 7.0 - 10.0 fL    Platelet 83 (L) 150 - 440 10*9/L    nRBC 14 (H) <=4 /100 WBCs    Variable HGB Concentration Slight (A) Not Present    Macrocytosis Moderate (A) Not Present    Anisocytosis Moderate (A) Not Present    Hypochromasia Slight (A) Not Present   aPTT    Collection Time: 04/23/18  5:37 AM   Result Value Ref Range    APTT 31.8 25.9 - 39.5 sec    Heparin Correlation 0.2    PT-INR    Collection Time: 04/23/18  5:37 AM   Result Value Ref Range    PT 11.7 10.2 - 12.8 sec    INR 1.03    Fibrinogen    Collection Time: 04/23/18  5:37 AM   Result Value Ref Range    Fibrinogen 376 177 - 386 mg/dL   D-Dimer, Quantitative    Collection Time: 04/23/18  5:37 AM   Result Value Ref Range    D-Dimer 9,446 (H) <230 ng/mL DDU   Manual Differential    Collection Time: 04/23/18  5:37 AM   Result Value Ref Range    Neutrophils % 17 %    Lymphocytes % 11 %    Monocytes % 0 %    Eosinophils % 8 %    Basophils % 3 %    Blasts % 61 (HH) <=0 %    Absolute Neutrophils 1.7 (L) 2.0 - 7.5 10*9/L    Absolute Lymphocytes 1.1 (L) 1.5 - 5.0 10*9/L    Absolute Monocytes 0.0 (L) 0.2 - 0.8 10*9/L    Absolute Eosinophils 0.8 (H) 0.0 - 0.4 10*9/L    Absolute Basophils 0.3 (H) 0.0 - 0.1 10*9/L    Smear Review Comments See Comment (A) Undefined   POCT Glucose    Collection Time: 04/23/18  7:34 AM   Result Value Ref Range    Glucose, POC 152 65 - 179 mg/dL   Type and Screen    Collection Time: 04/23/18  8:18 AM   Result Value Ref Range    ABO Grouping A POS     Antibody Screen NEG    POCT Glucose    Collection Time: 04/23/18 11:10 AM   Result Value Ref Range    Glucose, POC 190 (H) 65 - 179 mg/dL   Prepare RBC    Collection Time: 04/23/18 11:33 AM   Result Value Ref Range    Crossmatch Compatible     Unit Blood Type A Pos     ISBT Number 6200     Unit # [id number]     Status Issued     Spec Expiration [id number]     Product ID Red Blood Cells     PRODUCT CODE [id number]      Objective:  Temp:  [36.3 ??C-37.3 ??C] 37.2 ??C  Heart Rate:  [85-107] 90  Resp:  [16-18] 18  BP: (128-165)/(58-78) 138/78  SpO2:  [95 %-100 %] 98 %  General: in no acute distress, resting in bed, eating breakfast   HEENT: No scleral icterus. Oropharynx moist.   RESP: no conversational dyspnea, breathing comfortably on room air   CV: Regular rate and rhythm. Normal S1 and S2.   ABD: NABS, soft, non-distended, non-tender to palpation  EXT: No lower extremity edema, cyanosis, or clubbing.   SKIN: large superficial ecchymosis on left lateral thigh and left gluteal region improving, no evidence of hematoma  NEURO: No focal neurologic deficits, A/Ox3

## 2018-04-24 LAB — BASIC METABOLIC PANEL
ANION GAP: 4 mmol/L — ABNORMAL LOW (ref 9–15)
ANION GAP: 7 mmol/L — ABNORMAL LOW (ref 9–15)
BLOOD UREA NITROGEN: 13 mg/dL (ref 7–21)
BLOOD UREA NITROGEN: 17 mg/dL (ref 7–21)
BUN / CREAT RATIO: 33
BUN / CREAT RATIO: 37
CALCIUM: 8 mg/dL — ABNORMAL LOW (ref 8.5–10.2)
CALCIUM: 8.3 mg/dL — ABNORMAL LOW (ref 8.5–10.2)
CHLORIDE: 98 mmol/L (ref 98–107)
CHLORIDE: 95 mmol/L — ABNORMAL LOW (ref 98–107)
CO2: 29 mmol/L (ref 22.0–30.0)
CO2: 29 mmol/L (ref 22.0–30.0)
CREATININE: 0.39 mg/dL — ABNORMAL LOW (ref 0.60–1.00)
CREATININE: 0.46 mg/dL — ABNORMAL LOW (ref 0.60–1.00)
EGFR CKD-EPI AA FEMALE: >90 mL/min/{1.73_m2} (ref >=60)
EGFR CKD-EPI AA FEMALE: >90 mL/min/{1.73_m2} (ref >=60)
EGFR CKD-EPI NON-AA FEMALE: >90 mL/min/{1.73_m2} (ref >=60)
EGFR CKD-EPI NON-AA FEMALE: >90 mL/min/{1.73_m2} (ref >=60)
GLUCOSE RANDOM: 129 mg/dL (ref 65–179)
POTASSIUM: 3.8 mmol/L (ref 3.5–5.0)
POTASSIUM: 3.8 mmol/L (ref 3.5–5.0)
SODIUM: 131 mmol/L — ABNORMAL LOW (ref 135–145)

## 2018-04-24 LAB — D-DIMER QUANTITATIVE (CH,ML,PD,ET): Lab: 10961 — ABNORMAL HIGH

## 2018-04-24 LAB — FIBRINOGEN LEVEL: Lab: 326

## 2018-04-24 LAB — CBC W/ AUTO DIFF
HEMOGLOBIN: 7.6 g/dL — ABNORMAL LOW (ref 12.0–16.0)
MEAN CORPUSCULAR HEMOGLOBIN CONC: 33.5 g/dL (ref 31.0–37.0)
MEAN CORPUSCULAR VOLUME: 90.7 fL (ref 80.0–100.0)
MEAN PLATELET VOLUME: 11.9 fL — ABNORMAL HIGH (ref 7.0–10.0)
NUCLEATED RED BLOOD CELLS: 26 /100{WBCs} — ABNORMAL HIGH (ref <=4)
PLATELET COUNT: 80 10*9/L — ABNORMAL LOW (ref 150–440)
RED BLOOD CELL COUNT: 2.49 10*12/L — ABNORMAL LOW (ref 4.00–5.20)
RED CELL DISTRIBUTION WIDTH: 21.1 % — ABNORMAL HIGH (ref 12.0–15.0)
WBC ADJUSTED: 5.4 10*9/L (ref 4.5–11.0)

## 2018-04-24 LAB — SODIUM
Sodium:SCnc:Pt:Ser/Plas:Qn:: 131 — ABNORMAL LOW
Sodium:SCnc:Pt:Ser/Plas:Qn:: 131 — ABNORMAL LOW

## 2018-04-24 LAB — MANUAL DIFFERENTIAL
BLASTS - REL (DIFF): 64 % (ref <=0)
EOSINOPHILS - ABS (DIFF): 0.4 10*9/L (ref 0.0–0.4)
EOSINOPHILS - REL (DIFF): 7 %
LYMPHOCYTES - ABS (DIFF): 0.5 10*9/L — ABNORMAL LOW (ref 1.5–5.0)
MONOCYTES - ABS (DIFF): 0.1 10*9/L — ABNORMAL LOW (ref 0.2–0.8)
MONOCYTES - REL (DIFF): 2 %
NEUTROPHILS - ABS (DIFF): 0.9 10*9/L — ABNORMAL LOW (ref 2.0–7.5)
NEUTROPHILS - REL (DIFF): 16 %

## 2018-04-24 LAB — PROTIME-INR: INR: 1.09

## 2018-04-24 LAB — PROTIME: Lab: 12.4

## 2018-04-24 LAB — APTT
APTT: 36.3 s (ref 25.9–39.5)
Coagulation surface induced:Time:Pt:PPP:Qn:Coag: 36.3
HEPARIN CORRELATION: 0.2

## 2018-04-24 LAB — LYMPHOCYTES - REL (DIFF): Lab: 9

## 2018-04-24 LAB — PHOSPHORUS
Phosphate:MCnc:Pt:Ser/Plas:Qn:: 3.3
Phosphate:MCnc:Pt:Ser/Plas:Qn:: 3.6

## 2018-04-24 LAB — URIC ACID
Urate:MCnc:Pt:Ser/Plas:Qn:: 1.6 — ABNORMAL LOW
Urate:MCnc:Pt:Ser/Plas:Qn:: 2 — ABNORMAL LOW

## 2018-04-24 LAB — PLATELET COUNT: Lab: 80 — ABNORMAL LOW

## 2018-04-24 LAB — TRYPTASE LEVEL: Tryptase:MCnc:Pt:Ser/Plas:Qn:: 16.7 — ABNORMAL HIGH

## 2018-04-24 LAB — LACTATE DEHYDROGENASE
Lactate dehydrogenase:CCnc:Pt:Ser/Plas:Qn:: 1760 — ABNORMAL HIGH
Lactate dehydrogenase:CCnc:Pt:Ser/Plas:Qn:: 1818 — ABNORMAL HIGH

## 2018-04-24 LAB — MAGNESIUM: Magnesium:MCnc:Pt:Ser/Plas:Qn:: 1.6

## 2018-04-24 NOTE — Unmapped (Signed)
Daily Progress Note    24hr Events: No acute events overnight.  Continuing on aza/venetoclax (C1D2 =8/17). Pt MLL FISH revealed normal karyotype with no MLL rearrangement. Hydrea discontinued yesterday. Started Lantus for better BG control. Will continue to transfuse as necessary for low Hgb. Foley placed yesterday with increased urine output, will plan on removing today if possible.     Assessment/Plan:  Tracey Lang is a 77 y.o. 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     Principal Problem:    Acute myeloid leukemia (CMS-HCC)  Active Problems:    Thrombocytopenia (CMS-HCC)    Type 2 diabetes mellitus, without long-term current use of insulin (CMS-HCC)    Essential hypertension    Hyperlipidemia    GERD (gastroesophageal reflux disease)  Resolved Problems:    * No resolved hospital problems. *    #Acute Myeloid Leukemia: On admission to [facility], WBC 19.8, hgb 6.1, and platelets 135. No current evidence of tumor lysis or DIC. Patient has had intermittent thrombocytopenia and leukopenia in the past with normal SPEP and free light chains 11/18. The differential per her primary oncologist was ITP vs underlying myelodysplastic syndrome though has never had a bone marrow biopsy per patient preference. Peripheral blood smear on 8/10 showing numerous blasts. 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 ECOG1 per our assessment. Discussed BEAT AML trial and patient and husband do not wish to meet with coordinator or participate in trial. 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 Started chemo 8/16 after WBC count has fallen <10.   - Hydrea discontinued 8/16  - Continue aza/venetoclax for 7 days inpatient (C1D2 =8/17),   - Continue monitoring TLS labs BID   - Start PPx with Levaquin 8/18   - Bone marrow biopsy conducted 8/12 - prelim. c/w AML, cytogenetics demonstrate normal karyotype on first 10 metaphases, MLL FISH revealed normal karyotype with no MLL rearrangement.  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 Home coreg 6.25 mg discontinued - Venetoclax is a p-glycoprotein substrate while carvedilol is a p-gp inhibitor so Venetoclax dosing would need to be reduced in patients with AML if concomitantly using carvedilol.  - home lisinopril 10 mg daily, continue to monitor BP and titrate up as needed  - add CCB as needed; will avoid increasing lisinopril for now given concern for TLS and hyperK  ??  #HLD:  - Hold home simvastatin   ??  #T2DM: BG well controlled on admission but elevated more recently - BG of 263 on 8/15, will CTM   - Hold home metformin   - SSI  -Started Lantus 3 units for better BG control    #Asthma  - Continue home advair and albuterol nebs prn    FEN: General diet  GI ppx: Not indicated  DVT ppx: SCDs  Code Status: Full Code, confirmed with patient on admission  Dispo: Floor, E1 admit  ________________________________________________________________    Subjective:  Pt complained of abdominal cramping this morning which she believes is likely attributable to gas pains. Reports feeling better after nurse brought her hot tea and she had a bowel movement. Team updated husband on plan of care.     Labs/Studies:  Labs and Studies from the last 24hrs per EMR and Reviewed and   All lab results last 24 hours:    Recent Results (from the past 24 hour(s))   POCT Glucose Collection Time: 04/23/18  4:13 PM   Result Value Ref Range    Glucose, POC 149 65 - 179 mg/dL   Basic Metabolic Panel    Collection Time: 04/23/18  5:25 PM   Result Value Ref Range    Sodium 130 (L) 135 - 145 mmol/L    Potassium 3.8 3.5 - 5.0 mmol/L    Chloride 93 (L) 98 - 107 mmol/L    CO2 31.0 (H) 22.0 - 30.0 mmol/L    Anion Gap 6 (L) 9 - 15 mmol/L    BUN 17 7 - 21 mg/dL    Creatinine 1.61 (L) 0.60 - 1.00 mg/dL    BUN/Creatinine Ratio 40     EGFR CKD-EPI Non-African American, Female >90 >=60 mL/min/1.89m2    EGFR CKD-EPI African American, Female >90 >=60 mL/min/1.11m2    Glucose 146 65 - 179 mg/dL    Calcium 8.5 8.5 - 09.6 mg/dL   Phosphorus Level    Collection Time: 04/23/18  5:25 PM   Result Value Ref Range    Phosphorus 3.4 2.9 - 4.7 mg/dL   Uric acid    Collection Time: 04/23/18  5:25 PM   Result Value Ref Range    Uric Acid 1.5 (L) 3.0 - 6.5 mg/dL   Lactate dehydrogenase    Collection Time: 04/23/18  5:25 PM   Result Value Ref Range    LDH 1,696 (H) 338 - 610 U/L   POCT Glucose    Collection Time: 04/23/18  8:40 PM   Result Value Ref Range    Glucose, POC 246 (H) 65 - 179 mg/dL   Basic Metabolic Panel    Collection Time: 04/24/18  6:02 AM   Result Value Ref Range    Sodium 131 (L) 135 - 145 mmol/L    Potassium 3.8 3.5 - 5.0 mmol/L    Chloride 98 98 - 107 mmol/L    CO2 29.0 22.0 - 30.0 mmol/L    Anion Gap 4 (L) 9 - 15 mmol/L    BUN 13 7 - 21 mg/dL    Creatinine 0.45 (L) 0.60 - 1.00 mg/dL    BUN/Creatinine Ratio 33     EGFR CKD-EPI Non-African American, Female >90 >=60 mL/min/1.60m2    EGFR CKD-EPI African American, Female >90 >=60 mL/min/1.39m2    Glucose 140 65 - 179 mg/dL    Calcium 8.0 (L) 8.5 - 10.2 mg/dL   Phosphorus Level    Collection Time: 04/24/18  6:02 AM   Result Value Ref Range    Phosphorus 3.6 2.9 - 4.7 mg/dL   Magnesium Level    Collection Time: 04/24/18  6:02 AM   Result Value Ref Range    Magnesium 1.6 1.6 - 2.2 mg/dL   Lactate dehydrogenase    Collection Time: 04/24/18  6:02 AM   Result Value Ref Range    LDH 1,760 (H) 338 - 610 U/L   Uric acid    Collection Time: 04/24/18  6:02 AM   Result Value Ref Range  Uric Acid 2.0 (L) 3.0 - 6.5 mg/dL   CBC w/ Differential    Collection Time: 04/24/18  6:02 AM   Result Value Ref Range    WBC 5.4 4.5 - 11.0 10*9/L    RBC 2.49 (L) 4.00 - 5.20 10*12/L    HGB 7.6 (L) 12.0 - 16.0 g/dL    HCT 78.2 (L) 95.6 - 46.0 %    MCV 90.7 80.0 - 100.0 fL    MCH 30.3 26.0 - 34.0 pg    MCHC 33.5 31.0 - 37.0 g/dL    RDW 21.3 (H) 08.6 - 15.0 %    MPV 11.9 (H) 7.0 - 10.0 fL    Platelet 80 (L) 150 - 440 10*9/L    nRBC 26 (H) <=4 /100 WBCs    Variable HGB Concentration Slight (A) Not Present    Macrocytosis Slight (A) Not Present    Anisocytosis Moderate (A) Not Present    Hypochromasia Slight (A) Not Present   aPTT    Collection Time: 04/24/18  6:02 AM   Result Value Ref Range    APTT 36.3 25.9 - 39.5 sec    Heparin Correlation 0.2    PT-INR    Collection Time: 04/24/18  6:02 AM   Result Value Ref Range    PT 12.4 10.2 - 12.8 sec    INR 1.09    Fibrinogen    Collection Time: 04/24/18  6:02 AM   Result Value Ref Range    Fibrinogen 326 177 - 386 mg/dL   D-Dimer, Quantitative    Collection Time: 04/24/18  6:02 AM   Result Value Ref Range    D-Dimer 10,961 (H) <230 ng/mL DDU   Manual Differential    Collection Time: 04/24/18  6:02 AM   Result Value Ref Range    Neutrophils % 16 %    Lymphocytes % 9 %    Monocytes % 2 %    Eosinophils % 7 %    Basophils % 2 %    Blasts % 64 (HH) <=0 %    Absolute Neutrophils 0.9 (L) 2.0 - 7.5 10*9/L    Absolute Lymphocytes 0.5 (L) 1.5 - 5.0 10*9/L    Absolute Monocytes 0.1 (L) 0.2 - 0.8 10*9/L    Absolute Eosinophils 0.4 0.0 - 0.4 10*9/L    Absolute Basophils 0.1 0.0 - 0.1 10*9/L    Smear Review Comments See Comment (A) Undefined   POCT Glucose    Collection Time: 04/24/18  8:08 AM   Result Value Ref Range    Glucose, POC 135 65 - 179 mg/dL   Prepare RBC    Collection Time: 04/24/18 10:13 AM   Result Value Ref Range    Crossmatch Compatible     Unit Blood Type A Pos     ISBT Number 6200     Unit # [id number]     Status Issued     Spec Expiration [id number]     Product ID Red Blood Cells     PRODUCT CODE [id number]    POCT Glucose    Collection Time: 04/24/18 11:14 AM   Result Value Ref Range    Glucose, POC 194 (H) 65 - 179 mg/dL     Objective:  Temp:  [36.5 ??C-37.4 ??C] 36.8 ??C  Heart Rate:  [86-99] 96  Resp:  [16-20] 18  BP: (139-184)/(57-101) 142/73  SpO2:  [96 %-99 %] 96 %    General: in no acute distress, resting in bed, eating breakfast  HEENT: No scleral icterus. Oropharynx moist.   RESP: no conversational dyspnea, breathing comfortably on room air   CV: Regular rate and rhythm. Normal S1 and S2.   ABD: NABS, soft, non-distended, non-tender to palpation  EXT: No lower extremity edema, cyanosis, or clubbing.   SKIN: large superficial ecchymosis on left lateral thigh and left gluteal region migrating to lower back but improving, no evidence of hematoma  NEURO: No focal neurologic deficits, A/Ox3

## 2018-04-24 NOTE — Unmapped (Signed)
Pt is A&O, hypertensive, afebrile. No falls or acute events. Foley placed prior to chemo administration. No complaints of pain or nausea. Day 2 at 1800 Azacitidine and venetoclax; tolerated initial doses without issue. WCTM.     Problem: Fall Injury Risk  Goal: Absence of Fall and Fall-Related Injury  Outcome: Progressing     Problem: Self-Care Deficit  Goal: Improved Ability to Complete Activities of Daily Living  Outcome: Ongoing - Unchanged     Problem: Diabetes Comorbidity  Goal: Blood Glucose Level Within Desired Range  Outcome: Ongoing - Unchanged     Problem: Hypertension Comorbidity  Goal: Blood Pressure in Desired Range  Outcome: Ongoing - Unchanged     Problem: Coping Ineffective (Oncology Care)  Goal: Effective Coping  Outcome: Ongoing - Unchanged     Problem: Fatigue (Oncology Care)  Goal: Improved Activity Tolerance  Outcome: Ongoing - Unchanged

## 2018-04-24 NOTE — Unmapped (Signed)
Pt cont to have multiple round of large amounts of incontinence of urine today.  Informed MED E1 of this due to need for catheter placement while getting chemo (due to high exposure risk to staff and chemo spills with every bought of inc).  Order was written to place foley cath.  Attempted to place 33fr foley (stocked on 4 ONC) and cath size too large for patient's urethral. At present time we are trying to order/find a 62fr cath to place.  HUC attempting as well.  Pt has denied pain thus far today.  VS were stable with the exception of one set where the BP was elevated.  Reported to MED E 1 team and no new orders were written.  Pt was having stomach cramps at the time.  It's has been several days since patient has had a BM.   Will cont to mont.

## 2018-04-25 LAB — BASIC METABOLIC PANEL
ANION GAP: 5 mmol/L — ABNORMAL LOW (ref 9–15)
ANION GAP: 2 mmol/L — ABNORMAL LOW (ref 9–15)
BLOOD UREA NITROGEN: 16 mg/dL (ref 7–21)
BUN / CREAT RATIO: 33
BUN / CREAT RATIO: 37
CALCIUM: 8.2 mg/dL — ABNORMAL LOW (ref 8.5–10.2)
CALCIUM: 8.2 mg/dL — ABNORMAL LOW (ref 8.5–10.2)
CHLORIDE: 99 mmol/L (ref 98–107)
CHLORIDE: 98 mmol/L (ref 98–107)
CO2: 28 mmol/L (ref 22.0–30.0)
CO2: 30 mmol/L (ref 22.0–30.0)
CREATININE: 0.42 mg/dL — ABNORMAL LOW (ref 0.60–1.00)
CREATININE: 0.43 mg/dL — ABNORMAL LOW (ref 0.60–1.00)
EGFR CKD-EPI AA FEMALE: >90 mL/min/{1.73_m2} (ref >=60)
EGFR CKD-EPI AA FEMALE: >90 mL/min/{1.73_m2} (ref >=60)
EGFR CKD-EPI NON-AA FEMALE: >90 mL/min/{1.73_m2} (ref >=60)
GLUCOSE RANDOM: 105 mg/dL (ref 65–179)
GLUCOSE RANDOM: 166 mg/dL (ref 65–179)
POTASSIUM: 3.5 mmol/L (ref 3.5–5.0)
POTASSIUM: 3.9 mmol/L (ref 3.5–5.0)
SODIUM: 132 mmol/L — ABNORMAL LOW (ref 135–145)
SODIUM: 130 mmol/L — ABNORMAL LOW (ref 135–145)

## 2018-04-25 LAB — GLUCOSE RANDOM: Glucose:MCnc:Pt:Ser/Plas:Qn:: 105

## 2018-04-25 LAB — MANUAL DIFFERENTIAL
BASOPHILS - ABS (DIFF): 0 10*9/L (ref 0.0–0.1)
BASOPHILS - REL (DIFF): 1 %
BLASTS - REL (DIFF): 71 % (ref <=0)
EOSINOPHILS - ABS (DIFF): 0.3 10*9/L (ref 0.0–0.4)
EOSINOPHILS - REL (DIFF): 9 %
LYMPHOCYTES - ABS (DIFF): 0.4 10*9/L — ABNORMAL LOW (ref 1.5–5.0)
MONOCYTES - ABS (DIFF): 0 10*9/L — ABNORMAL LOW (ref 0.2–0.8)
MONOCYTES - REL (DIFF): 1 %
NEUTROPHILS - REL (DIFF): 5 %

## 2018-04-25 LAB — CBC W/ AUTO DIFF
HEMATOCRIT: 27.5 % — ABNORMAL LOW (ref 36.0–46.0)
MEAN CORPUSCULAR HEMOGLOBIN CONC: 32.7 g/dL (ref 31.0–37.0)
MEAN CORPUSCULAR VOLUME: 89.1 fL (ref 80.0–100.0)
MEAN PLATELET VOLUME: 11.1 fL — ABNORMAL HIGH (ref 7.0–10.0)
NUCLEATED RED BLOOD CELLS: 29 /100{WBCs} — ABNORMAL HIGH (ref <=4)
RED BLOOD CELL COUNT: 3.08 10*12/L — ABNORMAL LOW (ref 4.00–5.20)
RED CELL DISTRIBUTION WIDTH: 20.6 % — ABNORMAL HIGH (ref 12.0–15.0)
WBC ADJUSTED: 2.9 10*9/L — ABNORMAL LOW (ref 4.5–11.0)

## 2018-04-25 LAB — LACTATE DEHYDROGENASE
Lactate dehydrogenase:CCnc:Pt:Ser/Plas:Qn:: 1537 — ABNORMAL HIGH
Lactate dehydrogenase:CCnc:Pt:Ser/Plas:Qn:: 1634 — ABNORMAL HIGH

## 2018-04-25 LAB — APTT
APTT: 40.6 s — ABNORMAL HIGH (ref 25.9–39.5)
Coagulation surface induced:Time:Pt:PPP:Qn:Coag: 40.6 — ABNORMAL HIGH

## 2018-04-25 LAB — URIC ACID
Urate:MCnc:Pt:Ser/Plas:Qn:: 1.4 — ABNORMAL LOW
Urate:MCnc:Pt:Ser/Plas:Qn:: 2 — ABNORMAL LOW

## 2018-04-25 LAB — SODIUM: Sodium:SCnc:Pt:Ser/Plas:Qn:: 130 — ABNORMAL LOW

## 2018-04-25 LAB — PHOSPHORUS
Phosphate:MCnc:Pt:Ser/Plas:Qn:: 3.3
Phosphate:MCnc:Pt:Ser/Plas:Qn:: 3.4

## 2018-04-25 LAB — PROTIME-INR: PROTIME: 12 s (ref 10.2–12.8)

## 2018-04-25 LAB — D-DIMER QUANTITATIVE (CH,ML,PD,ET)
Lab: 4681 — ABNORMAL HIGH
Lab: 4894 — ABNORMAL HIGH

## 2018-04-25 LAB — MEAN CORPUSCULAR HEMOGLOBIN CONC: Lab: 32.7

## 2018-04-25 LAB — MAGNESIUM: Magnesium:MCnc:Pt:Ser/Plas:Qn:: 1.6

## 2018-04-25 LAB — EOSINOPHILS - REL (DIFF): Lab: 9

## 2018-04-25 LAB — FIBRINOGEN LEVEL: Lab: 383

## 2018-04-25 LAB — INR: Lab: 1.05

## 2018-04-25 NOTE — Unmapped (Signed)
Pt complained of gas pain/cramping this shift. Simethicone given. VSS. Falls precautions maintained. PRBCs given. BM today - holding mirilax per MD. Will continue to monitor.       Problem: Adult Inpatient Plan of Care  Goal: Plan of Care Review  Outcome: Progressing  Flowsheets (Taken 04/24/2018 1829)  Progress: improving  Plan of Care Reviewed With: patient     Problem: Fall Injury Risk  Goal: Absence of Fall and Fall-Related Injury  Outcome: Progressing     Problem: Self-Care Deficit  Goal: Improved Ability to Complete Activities of Daily Living  Outcome: Progressing

## 2018-04-25 NOTE — Unmapped (Signed)
Pt is A&O, hypertensive, afebrile. Pt did not request any prns this shift. Azacitidine given without issue. Abd is red on LLQ from injection 8/16. Pt's foley remains in place; care provided. Plan is for pt to be discharged to SNF when a bed is available. WCTM.       Problem: Fall Injury Risk  Goal: Absence of Fall and Fall-Related Injury  Outcome: Progressing     Problem: Self-Care Deficit  Goal: Improved Ability to Complete Activities of Daily Living  Outcome: Ongoing - Unchanged     Problem: Hypertension Comorbidity  Goal: Blood Pressure in Desired Range  Outcome: Ongoing - Unchanged

## 2018-04-26 LAB — BASIC METABOLIC PANEL
ANION GAP: 3 mmol/L — ABNORMAL LOW (ref 9–15)
BLOOD UREA NITROGEN: 14 mg/dL (ref 7–21)
BUN / CREAT RATIO: 33
CALCIUM: 8.8 mg/dL (ref 8.5–10.2)
CHLORIDE: 100 mmol/L (ref 98–107)
CO2: 30 mmol/L (ref 22.0–30.0)
CREATININE: 0.43 mg/dL — ABNORMAL LOW (ref 0.60–1.00)
EGFR CKD-EPI NON-AA FEMALE: >90 mL/min/{1.73_m2} (ref >=60)
GLUCOSE RANDOM: 114 mg/dL (ref 65–179)
POTASSIUM: 3.6 mmol/L (ref 3.5–5.0)

## 2018-04-26 LAB — CBC W/ AUTO DIFF
HEMATOCRIT: 28.7 % — ABNORMAL LOW (ref 36.0–46.0)
HEMOGLOBIN: 9.4 g/dL — ABNORMAL LOW (ref 12.0–16.0)
MEAN CORPUSCULAR HEMOGLOBIN CONC: 32.8 g/dL (ref 31.0–37.0)
MEAN CORPUSCULAR HEMOGLOBIN: 30 pg (ref 26.0–34.0)
MEAN CORPUSCULAR VOLUME: 91.4 fL (ref 80.0–100.0)
MEAN PLATELET VOLUME: 11.4 fL — ABNORMAL HIGH (ref 7.0–10.0)
NUCLEATED RED BLOOD CELLS: 22 /100{WBCs} — ABNORMAL HIGH (ref <=4)
RED BLOOD CELL COUNT: 3.15 10*12/L — ABNORMAL LOW (ref 4.00–5.20)
RED CELL DISTRIBUTION WIDTH: 21.4 % — ABNORMAL HIGH (ref 12.0–15.0)

## 2018-04-26 LAB — POTASSIUM: Potassium:SCnc:Pt:Ser/Plas:Qn:: 3.6

## 2018-04-26 LAB — MANUAL DIFFERENTIAL
BASOPHILS - ABS (DIFF): 0 10*9/L (ref 0.0–0.1)
BASOPHILS - REL (DIFF): 2 %
EOSINOPHILS - ABS (DIFF): 0.2 10*9/L (ref 0.0–0.4)
LYMPHOCYTES - ABS (DIFF): 0.6 10*9/L — ABNORMAL LOW (ref 1.5–5.0)
LYMPHOCYTES - REL (DIFF): 30 %
MONOCYTES - ABS (DIFF): 0 10*9/L — ABNORMAL LOW (ref 0.2–0.8)
MONOCYTES - REL (DIFF): 0 %
NEUTROPHILS - ABS (DIFF): 0.2 10*9/L — CL (ref 2.0–7.5)
NEUTROPHILS - REL (DIFF): 9 %

## 2018-04-26 LAB — HEPATIC FUNCTION PANEL
ALBUMIN: 3 g/dL — ABNORMAL LOW (ref 3.5–5.0)
ALKALINE PHOSPHATASE: 102 U/L (ref 38–126)
BILIRUBIN DIRECT: 0.1 mg/dL (ref 0.00–0.40)
BILIRUBIN TOTAL: 1.8 mg/dL — ABNORMAL HIGH (ref 0.0–1.2)
PROTEIN TOTAL: 7 g/dL (ref 6.5–8.3)

## 2018-04-26 LAB — HEPARIN CORRELATION: Lab: 0.2

## 2018-04-26 LAB — PROTIME: Lab: 12.5

## 2018-04-26 LAB — D-DIMER QUANTITATIVE (CH,ML,PD,ET): Lab: 3638 — ABNORMAL HIGH

## 2018-04-26 LAB — NUCLEATED RED BLOOD CELLS: Lab: 22 — ABNORMAL HIGH

## 2018-04-26 LAB — PHOSPHORUS: Phosphate:MCnc:Pt:Ser/Plas:Qn:: 3.9

## 2018-04-26 LAB — MAGNESIUM: Magnesium:MCnc:Pt:Ser/Plas:Qn:: 1.7

## 2018-04-26 LAB — LACTATE DEHYDROGENASE: Lactate dehydrogenase:CCnc:Pt:Ser/Plas:Qn:: 1384 — ABNORMAL HIGH

## 2018-04-26 LAB — URIC ACID: Urate:MCnc:Pt:Ser/Plas:Qn:: 2.3 — ABNORMAL LOW

## 2018-04-26 LAB — ALBUMIN: Albumin:MCnc:Pt:Ser/Plas:Qn:: 3 — ABNORMAL LOW

## 2018-04-26 LAB — EOSINOPHILS - REL (DIFF): Lab: 8

## 2018-04-26 LAB — FIBRINOGEN LEVEL: Lab: 376

## 2018-04-26 MED ORDER — POSACONAZOLE 100 MG TABLET,DELAYED RELEASE
ORAL_TABLET | 0 refills | 0 days | Status: CP
Start: 2018-04-26 — End: 2018-04-29

## 2018-04-26 NOTE — Unmapped (Signed)
Pt alert and oriented x4, VSS afebrile; breathing even and unlabored on RA, lungs B CTA; pt cont on chemo, tolerating well this far; with foley while on chemo, clear yellow UOP noted, no BM this shift; received Simethicone x1 for gas pain, adequate relief noted; bed in lowest position, call bell within reach

## 2018-04-26 NOTE — Unmapped (Signed)
Daily Progress Note    24hr Events: No acute events overnight. Pt reports she is doing well. Levaquin started for ppx yesterday. C1D4 of aza/venetoclax therapy. Foley in place. SNF placement pending.     Assessment/Plan:  Tracey Lang is a 77 y.o. 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     Principal Problem:    Acute myeloid leukemia (CMS-HCC)  Active Problems:    Thrombocytopenia (CMS-HCC)    Type 2 diabetes mellitus, without long-term current use of insulin (CMS-HCC)    Essential hypertension    Hyperlipidemia    GERD (gastroesophageal reflux disease)  Resolved Problems:    * No resolved hospital problems. *    #Acute Myeloid Leukemia: On admission to [facility], WBC 19.8, hgb 6.1, and platelets 135. No current evidence of tumor lysis or DIC. Patient has had intermittent thrombocytopenia and leukopenia in the past with normal SPEP and free light chains 11/18. The differential per her primary oncologist was ITP vs underlying myelodysplastic syndrome though has never had a bone marrow biopsy per patient preference. Peripheral blood smear on 8/10 showing numerous blasts. 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 ECOG1 per our assessment. Discussed [facility] AML trial and patient and husband do not wish to meet with coordinator or participate in trial. 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 Started chemo 8/16 after WBC count has fallen <10.   - Hydrea discontinued 8/16  - Continue aza/venetoclax for 7 days inpatient (C1D4 =8/19),   - Continue monitoring TLS labs daily    - Started PPx with Levaquin 8/18   - Bone marrow biopsy conducted 8/12 - c/w AML, cytogenetics demonstrate normal karyotype on first 10 metaphases, MLL FISH revealed normal karyotype with no MLL rearrangement.  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 Home coreg 6.25 mg discontinued - Venetoclax is a p-glycoprotein substrate while carvedilol is a p-gp inhibitor so Venetoclax dosing would need to be reduced in patients with AML if concomitantly using carvedilol.  - home lisinopril 10 mg daily, continue to monitor BP and titrate up as needed  -Start amlodipine 5mg  daily 8/19, recently hypertensive overnight  ??  #HLD:  - Hold home simvastatin   ??  #T2DM: BG well controlled on admission but elevated more recently - BG of 263 on 8/15, will CTM   - Hold home metformin   - SSI  -Continue Lantus 3 units for better BG control    #Asthma  - Continue home advair and albuterol nebs prn    FEN: General diet  GI ppx: Not indicated  DVT ppx: SCDs  Code Status: Full Code, confirmed with patient on admission  Dispo: Floor, E1 admit  ________________________________________________________________    Subjective:  Pt notes she is doing well this morning. Sleeping well. Denies side effects on current chemo regimen.     Labs/Studies:  Labs and Studies from the last  24hrs per EMR and Reviewed and   All lab results last 24 hours:    Recent Results (from the past 24 hour(s))   POCT Glucose    Collection Time: 04/25/18  4:47 PM   Result Value Ref Range    Glucose, POC 99 65 - 179 mg/dL   Basic Metabolic Panel    Collection Time: 04/25/18  5:57 PM   Result Value Ref Range    Sodium 130 (L) 135 - 145 mmol/L    Potassium 3.9 3.5 - 5.0 mmol/L    Chloride 98 98 - 107 mmol/L    CO2 30.0 22.0 - 30.0 mmol/L    Anion Gap 2 (L) 9 - 15 mmol/L    BUN 16 7 - 21 mg/dL    Creatinine 0.98 (L) 0.60 - 1.00 mg/dL    BUN/Creatinine Ratio 37     EGFR CKD-EPI Non-African American, Female >90 >=60 mL/min/1.49m2    EGFR CKD-EPI African American, Female >90 >=60 mL/min/1.70m2    Glucose 166 65 - 179 mg/dL    Calcium 8.2 (L) 8.5 - 10.2 mg/dL   Lactate dehydrogenase    Collection Time: 04/25/18  5:57 PM   Result Value Ref Range    LDH 1,537 (H) 338 - 610 U/L   Phosphorus Level    Collection Time: 04/25/18  5:57 PM   Result Value Ref Range    Phosphorus 3.3 2.9 - 4.7 mg/dL   Uric acid    Collection Time: 04/25/18  5:57 PM   Result Value Ref Range    Uric Acid 1.4 (L) 3.0 - 6.5 mg/dL   Prepare RBC    Collection Time: 04/25/18  7:00 PM   Result Value Ref Range    Crossmatch Compatible     Unit Blood Type A Pos     ISBT Number 6200     Unit # [id number]     Status Transfused     Product ID Red Blood Cells     PRODUCT CODE [id number]    POCT Glucose    Collection Time: 04/25/18  8:44 PM   Result Value Ref Range    Glucose, POC 172 65 - 179 mg/dL   Magnesium Level    Collection Time: 04/26/18  6:19 AM   Result Value Ref Range    Magnesium 1.7 1.6 - 2.2 mg/dL   Hepatic Function Panel    Collection Time: 04/26/18  6:19 AM   Result Value Ref Range    Albumin 3.0 (L) 3.5 - 5.0 g/dL    Total Protein 7.0 6.5 - 8.3 g/dL    Total Bilirubin 1.8 (H) 0.0 - 1.2 mg/dL    Bilirubin, Direct 3.08 0.00 - 0.40 mg/dL    AST 42 (H) 14 - 38 U/L    ALT 24 15 - 48 U/L    Alkaline Phosphatase 102 38 - 126 U/L   Basic Metabolic Panel    Collection Time: 04/26/18  6:19 AM   Result Value Ref Range    Sodium 133 (L) 135 - 145 mmol/L    Potassium 3.6 3.5 - 5.0 mmol/L    Chloride 100 98 - 107 mmol/L    CO2 30.0 22.0 - 30.0 mmol/L    Anion Gap 3 (L) 9 - 15 mmol/L    BUN 14 7 - 21 mg/dL    Creatinine 6.57 (L) 0.60 - 1.00 mg/dL    BUN/Creatinine Ratio 33     EGFR CKD-EPI Non-African American, Female >90 >=60 mL/min/1.54m2  EGFR CKD-EPI African American, Female >90 >=60 mL/min/1.73m2    Glucose 114 65 - 179 mg/dL    Calcium 8.8 8.5 - 16.1 mg/dL   Lactate dehydrogenase    Collection Time: 04/26/18  6:19 AM   Result Value Ref Range    LDH 1,384 (H) 338 - 610 U/L   Phosphorus Level    Collection Time: 04/26/18  6:19 AM   Result Value Ref Range    Phosphorus 3.9 2.9 - 4.7 mg/dL   Uric acid    Collection Time: 04/26/18  6:19 AM   Result Value Ref Range    Uric Acid 2.3 (L) 3.0 - 6.5 mg/dL   CBC w/ Differential    Collection Time: 04/26/18  6:19 AM   Result Value Ref Range    WBC 1.9 (L) 4.5 - 11.0 10*9/L    RBC 3.15 (L) 4.00 - 5.20 10*12/L    HGB 9.4 (L) 12.0 - 16.0 g/dL    HCT 09.6 (L) 04.5 - 46.0 %    MCV 91.4 80.0 - 100.0 fL    MCH 30.0 26.0 - 34.0 pg    MCHC 32.8 31.0 - 37.0 g/dL    RDW 40.9 (H) 81.1 - 15.0 %    MPV 11.4 (H) 7.0 - 10.0 fL    Platelet 63 (L) 150 - 440 10*9/L    nRBC 22 (H) <=4 /100 WBCs    Macrocytosis Slight (A) Not Present    Anisocytosis Moderate (A) Not Present    Hypochromasia Slight (A) Not Present   aPTT    Collection Time: 04/26/18  6:19 AM   Result Value Ref Range    APTT 33.2 25.9 - 39.5 sec    Heparin Correlation 0.2    PT-INR    Collection Time: 04/26/18  6:19 AM   Result Value Ref Range    PT 12.5 10.2 - 12.8 sec    INR 1.10    Fibrinogen    Collection Time: 04/26/18  6:19 AM   Result Value Ref Range    Fibrinogen 376 177 - 386 mg/dL   D-Dimer, Quantitative    Collection Time: 04/26/18  6:19 AM   Result Value Ref Range    D-Dimer 3,638 (H) <230 ng/mL DDU   Manual Differential    Collection Time: 04/26/18  6:19 AM   Result Value Ref Range    Neutrophils % 9 %    Lymphocytes % 30 %    Monocytes % 0 %    Eosinophils % 8 %    Basophils % 2 %    Blasts % 51 (HH) <=0 %    Absolute Neutrophils 0.2 (LL) 2.0 - 7.5 10*9/L    Absolute Lymphocytes 0.6 (L) 1.5 - 5.0 10*9/L    Absolute Monocytes 0.0 (L) 0.2 - 0.8 10*9/L    Absolute Eosinophils 0.2 0.0 - 0.4 10*9/L    Absolute Basophils 0.0 0.0 - 0.1 10*9/L    Smear Review Comments See Comment (A) Undefined   POCT Glucose    Collection Time: 04/26/18  7:36 AM   Result Value Ref Range    Glucose, POC 110 65 - 179 mg/dL   POCT Glucose    Collection Time: 04/26/18 11:30 AM   Result Value Ref Range    Glucose, POC 151 65 - 179 mg/dL     Objective:  Temp:  [36.6 ??C-37.3 ??C] 37.3 ??C  Heart Rate:  [72-105] 105  Resp:  [16-20] 16  BP: (131-178)/(65-85) 131/65  SpO2:  [93 %-  98 %] 94 %    General: in no acute distress, resting in bed  HEENT: No scleral icterus. Oropharynx moist.   RESP: no conversational dyspnea, breathing comfortably on room air   CV: Regular rate and rhythm. Normal S1 and S2.   ABD: NABS, soft, non-distended, non-tender to palpation  EXT: No lower extremity edema, cyanosis, or clubbing.   SKIN: large superficial ecchymosis on left lateral thigh and left gluteal region improving daily, no evidence of hematoma  NEURO: No focal neurologic deficits, A/Ox3

## 2018-04-26 NOTE — Unmapped (Signed)
Pt AxOx4, VSS, afebrile and free of falls/injuries this shift. Pt received Simethicone for gas/cramping x2 with good results. UOP remains adequate. Pt resting with no other complaints/concerns. Will continue to monitor.

## 2018-04-26 NOTE — Unmapped (Signed)
Daily Progress Note    24hr Events: No acute events overnight. C1D3 of aza/venetoclax therapy. Started on levaquin ppx today. Will continue to transfuse as necessary for low Hgb. Foley in place.     Assessment/Plan:  Tracey Lang is a 77 y.o. 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     Principal Problem:    Acute myeloid leukemia (CMS-HCC)  Active Problems:    Thrombocytopenia (CMS-HCC)    Type 2 diabetes mellitus, without long-term current use of insulin (CMS-HCC)    Essential hypertension    Hyperlipidemia    GERD (gastroesophageal reflux disease)  Resolved Problems:    * No resolved hospital problems. *    #Acute Myeloid Leukemia: On admission to [facility], WBC 19.8, hgb 6.1, and platelets 135. No current evidence of tumor lysis or DIC. Patient has had intermittent thrombocytopenia and leukopenia in the past with normal SPEP and free light chains 11/18. The differential per her primary oncologist was ITP vs underlying myelodysplastic syndrome though has never had a bone marrow biopsy per patient preference. Peripheral blood smear on 8/10 showing numerous blasts. 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 ECOG1 per our assessment. Discussed [facility] AML trial and patient and husband do not wish to meet with coordinator or participate in trial. 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 Started chemo 8/16 after WBC count has fallen <10.   - Hydrea discontinued 8/16  - Continue aza/venetoclax for 7 days inpatient (C1D3 =8/18),   - Continue monitoring TLS labs BID   - Started PPx with Levaquin 8/18   - Bone marrow biopsy conducted 8/12 - c/w AML, cytogenetics demonstrate normal karyotype on first 10 metaphases, MLL FISH revealed normal karyotype with no MLL rearrangement.  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 Home coreg 6.25 mg discontinued - Venetoclax is a p-glycoprotein substrate while carvedilol is a p-gp inhibitor so Venetoclax dosing would need to be reduced in patients with AML if concomitantly using carvedilol.  - home lisinopril 10 mg daily, continue to monitor BP and titrate up as needed  - add CCB as needed; will avoid increasing lisinopril for now given concern for TLS and hyperK  ??  #HLD:  - Hold home simvastatin   ??  #T2DM: BG well controlled on admission but elevated more recently - BG of 263 on 8/15, will CTM   - Hold home metformin   - SSI  -Continue Lantus 3 units for better BG control    #Asthma  - Continue home advair and albuterol nebs prn    FEN: General diet  GI ppx: Not indicated  DVT ppx: SCDs  Code Status: Full Code, confirmed with patient on admission  Dispo: Floor, E1 admit  ________________________________________________________________    Subjective:  Pt notes she is doing well this morning. Endorses healthy appetite. Bruising on hip is improving. Team updated pt's husband and  son regarding plan moving forward.     Labs/Studies:  Labs and Studies from the last 24hrs per EMR and Reviewed and   All lab results last 24 hours:    Recent Results (from the past 24 hour(s))   Prepare RBC    Collection Time: 04/24/18  7:02 PM   Result Value Ref Range    Crossmatch Compatible     Unit Blood Type A Pos     ISBT Number 6200     Unit # [id number]     Status Transfused     Product ID Red Blood Cells     PRODUCT CODE [id number]    POCT Glucose    Collection Time: 04/24/18  8:42 PM   Result Value Ref Range    Glucose, POC 263 (H) 65 - 179 mg/dL   Magnesium Level    Collection Time: 04/25/18  7:39 AM   Result Value Ref Range    Magnesium 1.6 1.6 - 2.2 mg/dL   D-Dimer, Quantitative    Collection Time: 04/25/18  7:39 AM   Result Value Ref Range    D-Dimer 4,681 (H) <230 ng/mL DDU   Basic Metabolic Panel    Collection Time: 04/25/18  7:39 AM   Result Value Ref Range    Sodium 132 (L) 135 - 145 mmol/L    Potassium 3.5 3.5 - 5.0 mmol/L    Chloride 99 98 - 107 mmol/L    CO2 28.0 22.0 - 30.0 mmol/L    Anion Gap 5 (L) 9 - 15 mmol/L    BUN 14 7 - 21 mg/dL    Creatinine 6.21 (L) 0.60 - 1.00 mg/dL    BUN/Creatinine Ratio 33     EGFR CKD-EPI Non-African American, Female >90 >=60 mL/min/1.16m2    EGFR CKD-EPI African American, Female >90 >=60 mL/min/1.61m2    Glucose 105 65 - 179 mg/dL    Calcium 8.2 (L) 8.5 - 10.2 mg/dL   Lactate dehydrogenase    Collection Time: 04/25/18  7:39 AM   Result Value Ref Range    LDH 1,634 (H) 338 - 610 U/L   Phosphorus Level    Collection Time: 04/25/18  7:39 AM   Result Value Ref Range    Phosphorus 3.4 2.9 - 4.7 mg/dL   Uric acid    Collection Time: 04/25/18  7:39 AM   Result Value Ref Range    Uric Acid 2.0 (L) 3.0 - 6.5 mg/dL   CBC w/ Differential    Collection Time: 04/25/18  7:39 AM   Result Value Ref Range    WBC 2.9 (L) 4.5 - 11.0 10*9/L    RBC 3.08 (L) 4.00 - 5.20 10*12/L    HGB 9.0 (L) 12.0 - 16.0 g/dL    HCT 30.8 (L) 65.7 - 46.0 %    MCV 89.1 80.0 - 100.0 fL    MCH 29.1 26.0 - 34.0 pg    MCHC 32.7 31.0 - 37.0 g/dL    RDW 84.6 (H) 96.2 - 15.0 %    MPV 11.1 (H) 7.0 - 10.0 fL    Platelet 86 (L) 150 - 440 10*9/L    nRBC 29 (H) <=4 /100 WBCs    Microcytosis Slight (A) Not Present    Macrocytosis Slight (A) Not Present    Anisocytosis Moderate (A) Not Present    Hypochromasia Slight (A) Not Present   aPTT    Collection Time: 04/25/18  7:39 AM   Result Value Ref Range    APTT 40.6 (  H) 25.9 - 39.5 sec    Heparin Correlation 0.2    PT-INR Collection Time: 04/25/18  7:39 AM   Result Value Ref Range    PT 12.0 10.2 - 12.8 sec    INR 1.05    Fibrinogen    Collection Time: 04/25/18  7:39 AM   Result Value Ref Range    Fibrinogen 383 177 - 386 mg/dL   D-Dimer, Quantitative    Collection Time: 04/25/18  7:39 AM   Result Value Ref Range    D-Dimer 4,894 (H) <230 ng/mL DDU   Manual Differential    Collection Time: 04/25/18  7:39 AM   Result Value Ref Range    Neutrophils % 5 %    Lymphocytes % 13 %    Monocytes % 1 %    Eosinophils % 9 %    Basophils % 1 %    Blasts % 71 (HH) <=0 %    Absolute Neutrophils 0.1 (LL) 2.0 - 7.5 10*9/L    Absolute Lymphocytes 0.4 (L) 1.5 - 5.0 10*9/L    Absolute Monocytes 0.0 (L) 0.2 - 0.8 10*9/L    Absolute Eosinophils 0.3 0.0 - 0.4 10*9/L    Absolute Basophils 0.0 0.0 - 0.1 10*9/L    Smear Review Comments See Comment (A) Undefined   POCT Glucose    Collection Time: 04/25/18  7:59 AM   Result Value Ref Range    Glucose, POC 109 65 - 179 mg/dL   POCT Glucose    Collection Time: 04/25/18 11:13 AM   Result Value Ref Range    Glucose, POC 157 65 - 179 mg/dL   POCT Glucose    Collection Time: 04/25/18  4:47 PM   Result Value Ref Range    Glucose, POC 99 65 - 179 mg/dL     Objective:  Temp:  [36.1 ??C-37.2 ??C] 37 ??C  Heart Rate:  [81-101] 89  Resp:  [15-16] 16  BP: (137-161)/(70-85) 137/70  SpO2:  [95 %-100 %] 98 %    General: in no acute distress, resting in bed, eating breakfast   HEENT: No scleral icterus. Oropharynx moist.   RESP: no conversational dyspnea, breathing comfortably on room air   CV: Regular rate and rhythm. Normal S1 and S2.   ABD: NABS, soft, non-distended, non-tender to palpation  EXT: No lower extremity edema, cyanosis, or clubbing.   SKIN: large superficial ecchymosis on left lateral thigh and left gluteal region migrating to lower back but improving daily, no evidence of hematoma  NEURO: No focal neurologic deficits, A/Ox3

## 2018-04-27 LAB — BASIC METABOLIC PANEL
ANION GAP: 3 mmol/L — ABNORMAL LOW (ref 9–15)
BLOOD UREA NITROGEN: 18 mg/dL (ref 7–21)
BUN / CREAT RATIO: 38
CHLORIDE: 100 mmol/L (ref 98–107)
CREATININE: 0.48 mg/dL — ABNORMAL LOW (ref 0.60–1.00)
EGFR CKD-EPI AA FEMALE: >90 mL/min/{1.73_m2} (ref >=60)
EGFR CKD-EPI NON-AA FEMALE: >90 mL/min/{1.73_m2} (ref >=60)
GLUCOSE RANDOM: 122 mg/dL (ref 65–179)
POTASSIUM: 3.5 mmol/L (ref 3.5–5.0)
SODIUM: 133 mmol/L — ABNORMAL LOW (ref 135–145)

## 2018-04-27 LAB — BASOPHILS - REL (DIFF): Lab: 1

## 2018-04-27 LAB — LACTATE DEHYDROGENASE: Lactate dehydrogenase:CCnc:Pt:Ser/Plas:Qn:: 1187 — ABNORMAL HIGH

## 2018-04-27 LAB — CBC W/ AUTO DIFF
HEMATOCRIT: 28 % — ABNORMAL LOW (ref 36.0–46.0)
HEMOGLOBIN: 9.1 g/dL — ABNORMAL LOW (ref 12.0–16.0)
MEAN CORPUSCULAR HEMOGLOBIN CONC: 32.7 g/dL (ref 31.0–37.0)
MEAN CORPUSCULAR HEMOGLOBIN: 29.8 pg (ref 26.0–34.0)
MEAN CORPUSCULAR VOLUME: 91.1 fL (ref 80.0–100.0)
MEAN PLATELET VOLUME: 11.3 fL — ABNORMAL HIGH (ref 7.0–10.0)
NUCLEATED RED BLOOD CELLS: 33 /100{WBCs} — ABNORMAL HIGH (ref <=4)
PLATELET COUNT: 68 10*9/L — ABNORMAL LOW (ref 150–440)
RED BLOOD CELL COUNT: 3.07 10*12/L — ABNORMAL LOW (ref 4.00–5.20)
RED CELL DISTRIBUTION WIDTH: 22.7 % — ABNORMAL HIGH (ref 12.0–15.0)

## 2018-04-27 LAB — MANUAL DIFFERENTIAL
BASOPHILS - ABS (DIFF): 0 10*9/L (ref 0.0–0.1)
BASOPHILS - REL (DIFF): 1 %
BLASTS - REL (DIFF): 51 % (ref <=0)
EOSINOPHILS - ABS (DIFF): 0.1 10*9/L (ref 0.0–0.4)
EOSINOPHILS - REL (DIFF): 10 %
LYMPHOCYTES - ABS (DIFF): 0.4 10*9/L — ABNORMAL LOW (ref 1.5–5.0)
LYMPHOCYTES - REL (DIFF): 34 %
MONOCYTES - REL (DIFF): 0 %
NEUTROPHILS - REL (DIFF): 4 %

## 2018-04-27 LAB — FIBRINOGEN LEVEL: Lab: 398 — ABNORMAL HIGH

## 2018-04-27 LAB — SODIUM: Sodium:SCnc:Pt:Ser/Plas:Qn:: 133 — ABNORMAL LOW

## 2018-04-27 LAB — APTT: APTT: 38.9 s (ref 25.9–39.5)

## 2018-04-27 LAB — HYPOCHROMIA

## 2018-04-27 LAB — HEPARIN CORRELATION: Lab: 0.2

## 2018-04-27 LAB — D-DIMER QUANTITATIVE (CH,ML,PD,ET): Lab: 2639 — ABNORMAL HIGH

## 2018-04-27 LAB — URIC ACID: Urate:MCnc:Pt:Ser/Plas:Qn:: 2.4 — ABNORMAL LOW

## 2018-04-27 LAB — INR: Lab: 1.11

## 2018-04-27 LAB — MAGNESIUM: Magnesium:MCnc:Pt:Ser/Plas:Qn:: 1.6

## 2018-04-27 LAB — PHOSPHORUS: Phosphate:MCnc:Pt:Ser/Plas:Qn:: 3.7

## 2018-04-27 NOTE — Unmapped (Signed)
Pt VSS, no c/o pain, afebrile, no falls. All medications given as per [facility]. Pt to get day four of azacitadine at 1830. Pt eating well, up to bsc and chair today. Bed locked and in lowest position, call bell in reach. Emotional support given to pt. No acute events today. Will CTM.

## 2018-04-27 NOTE — Unmapped (Signed)
Pt AxOx4, VSS, afebrile and free of falls/injuries this shift. UOP remains adequate. Pt resting with no other complaints/concerns. Will continue to monitor.

## 2018-04-27 NOTE — Unmapped (Signed)
Daily Progress Note    24hr Events: No acute events overnight. Pt reports she is doing well. C1D5 of aza/venetoclax therapy. Plan for DC to SNF after day 7 of course.     Assessment/Plan:  Tracey Lang is a 77 y.o. 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     Principal Problem:    Acute myeloid leukemia (CMS-HCC)  Active Problems:    Thrombocytopenia (CMS-HCC)    Type 2 diabetes mellitus, without long-term current use of insulin (CMS-HCC)    Essential hypertension    Hyperlipidemia    GERD (gastroesophageal reflux disease)  Resolved Problems:    * No resolved hospital problems. *    Acute Myeloid Leukemia:   On admission to [facility], WBC 19.8, Hgb 6.1, and platelets 135. No current evidence of tumor lysis or DIC. Patient has had intermittent thrombocytopenia and leukopenia in the past with normal SPEP and free light chains 11/18. Peripheral blood smear on 8/10 showing numerous blasts. 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 Bone marrow biopsy conducted on 8/12 consistent with AML and cytogenetics demonstrate normal karyotype on first 10 metaphases, MLL FISH revealed normal karyotype with no MLL rearrangement. ECOG1 per our assessment. Discussed [facility] AML trial and patient and husband do not wish to meet with coordinator or participate in trial. 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 Started chemo on 8/16 after WBC count has fallen <10.   - Continue aza/venetoclax for 7 days inpatient (C1D5 =8/20), SNF afterwards  - Continue monitoring TLS labs daily    - Continue Levaquin prophylaxis   - Allopurinol 300 mg daily  - HSV 1 IgG positive - Continue Valtrex 500 mg daily   - Transfuse for Hgb <7, platelets <10 in light of recent GIB    HTN:   BP has been well controlled. Home coreg 6.25 mg discontinued in setting of initiation of Venetoclax.  - Home lisinopril 10 mg daily, continue to monitor BP and titrate up as needed  - Start amlodipine 5mg  daily (began on 8/19), can up-titrate if necessary  ??  HLD:  - Hold home simvastatin   ??  T2DM:   BG well controlled on admission but elevated more recently. Yesterday, POC glucose of 231, otherwise well-controlled.  - Increase to Lantus 4U  - Continue to hold home metformin (sole home diabetes medication)  - SSI    Asthma  - Continue home advair and albuterol nebs prn    FEN: General diet  GI ppx: Not indicated  DVT ppx: SCDs  Code Status: Full Code, confirmed with patient on admission  Dispo: Floor, E1 admit  ________________________________________________________________    Subjective:  Pt notes she is doing well this morning. Feels good with no complaints.    Labs/Studies:  Labs and Studies from the last 24hrs per EMR and Reviewed and   All lab results last 24 hours:    Recent Results (from the past 24 hour(s))   POCT Glucose    Collection Time: 04/26/18 11:30 AM   Result Value Ref Range    Glucose, POC 151  65 - 179 mg/dL   POCT Glucose    Collection Time: 04/26/18  4:56 PM   Result Value Ref Range    Glucose, POC 194 (H) 65 - 179 mg/dL   POCT Glucose    Collection Time: 04/26/18  8:14 PM   Result Value Ref Range    Glucose, POC 231 (H) 65 - 179 mg/dL   Magnesium Level    Collection Time: 04/27/18  6:06 AM   Result Value Ref Range    Magnesium 1.6 1.6 - 2.2 mg/dL   Basic Metabolic Panel    Collection Time: 04/27/18  6:06 AM   Result Value Ref Range    Sodium 133 (L) 135 - 145 mmol/L    Potassium 3.5 3.5 - 5.0 mmol/L    Chloride 100 98 - 107 mmol/L    CO2 30.0 22.0 - 30.0 mmol/L    Anion Gap 3 (L) 9 - 15 mmol/L    BUN 18 7 - 21 mg/dL    Creatinine 1.30 (L) 0.60 - 1.00 mg/dL    BUN/Creatinine Ratio 38     EGFR CKD-EPI Non-African American, Female >90 >=60 mL/min/1.43m2    EGFR CKD-EPI African American, Female >90 >=60 mL/min/1.35m2    Glucose 122 65 - 179 mg/dL    Calcium 8.9 8.5 - 86.5 mg/dL   Lactate dehydrogenase    Collection Time: 04/27/18  6:06 AM   Result Value Ref Range    LDH 1,187 (H) 338 - 610 U/L   Phosphorus Level    Collection Time: 04/27/18  6:06 AM   Result Value Ref Range    Phosphorus 3.7 2.9 - 4.7 mg/dL   Uric acid    Collection Time: 04/27/18  6:06 AM   Result Value Ref Range    Uric Acid 2.4 (L) 3.0 - 6.5 mg/dL   CBC w/ Differential    Collection Time: 04/27/18  6:06 AM   Result Value Ref Range    WBC  4.5 - 11.0 10*9/L    RBC 3.07 (L) 4.00 - 5.20 10*12/L    HGB 9.1 (L) 12.0 - 16.0 g/dL    HCT 78.4 (L) 69.6 - 46.0 %    MCV 91.1 80.0 - 100.0 fL    MCH 29.8 26.0 - 34.0 pg    MCHC 32.7 31.0 - 37.0 g/dL    RDW 29.5 (H) 28.4 - 15.0 %    MPV 11.3 (H) 7.0 - 10.0 fL    Platelet 68 (L) 150 - 440 10*9/L    Macrocytosis Moderate (A) Not Present    Anisocytosis Marked (A) Not Present    Hypochromasia Slight (A) Not Present   aPTT    Collection Time: 04/27/18  6:06 AM   Result Value Ref Range    APTT 38.9 25.9 - 39.5 sec    Heparin Correlation 0.2    PT-INR    Collection Time: 04/27/18  6:06 AM   Result Value Ref Range    PT 12.6 10.2 - 12.8 sec    INR 1.11    Fibrinogen    Collection Time: 04/27/18  6:06 AM   Result Value Ref Range    Fibrinogen 398 (H) 177 - 386 mg/dL   D-Dimer, Quantitative    Collection Time: 04/27/18  6:06 AM   Result Value Ref Range    D-Dimer 2,639 (H) <230 ng/mL DDU     Objective:  Temp:  [36.5 ??C-37.3 ??C] 36.9 ??C  Heart Rate:  [88-105] 92  Resp:  [15-16] 16  BP: (118-140)/(62-71) 131/68  SpO2:  [93 %-100 %] 94 %    General: in no acute distress, resting in bed chatting with husband on phone  HEENT: NCAT. No scleral icterus. Oropharynx moist.   RESP: no conversational dyspnea, breathing comfortably on room air; lungs clear to auscultation bilaterally with good air movement throughout  CV: RRR with normal S1, S2. No murmurs, rubs or gallops noted  ABD: NABS, soft, non-distended, non-tender to palpation throughout  EXT: No lower extremity edema, cyanosis, or clubbing  SKIN: Large superficial ecchymosis on left lateral thigh and left gluteal region improving daily, no evidence of hematoma; non-tender to palpation  NEURO: No focal neurologic deficits, AOx3

## 2018-04-28 ENCOUNTER — Other Ambulatory Visit: Payer: Self-pay | Admitting: *Deleted

## 2018-04-28 DIAGNOSIS — C9200 Acute myeloblastic leukemia, not having achieved remission: Secondary | ICD-10-CM

## 2018-04-28 LAB — BASIC METABOLIC PANEL
ANION GAP: 1 mmol/L — ABNORMAL LOW (ref 9–15)
BUN / CREAT RATIO: 36
CALCIUM: 8.7 mg/dL (ref 8.5–10.2)
CHLORIDE: 101 mmol/L (ref 98–107)
CO2: 30 mmol/L (ref 22.0–30.0)
CREATININE: 0.47 mg/dL — ABNORMAL LOW (ref 0.60–1.00)
EGFR CKD-EPI AA FEMALE: >90 mL/min/{1.73_m2} (ref >=60)
EGFR CKD-EPI NON-AA FEMALE: >90 mL/min/{1.73_m2} (ref >=60)
GLUCOSE RANDOM: 119 mg/dL (ref 65–179)
POTASSIUM: 3.5 mmol/L (ref 3.5–5.0)
SODIUM: 132 mmol/L — ABNORMAL LOW (ref 135–145)

## 2018-04-28 LAB — MANUAL DIFFERENTIAL
BASOPHILS - REL (DIFF): 1 %
BLASTS - REL (DIFF): 36 % (ref <=0)
EOSINOPHILS - ABS (DIFF): 0.1 10*9/L (ref 0.0–0.4)
EOSINOPHILS - REL (DIFF): 10 %
LYMPHOCYTES - ABS (DIFF): 0.6 10*9/L — ABNORMAL LOW (ref 1.5–5.0)
LYMPHOCYTES - REL (DIFF): 46 %
MONOCYTES - REL (DIFF): 2 %
NEUTROPHILS - ABS (DIFF): 0.1 10*9/L — CL (ref 2.0–7.5)
NEUTROPHILS - REL (DIFF): 5 %

## 2018-04-28 LAB — CBC W/ AUTO DIFF
HEMATOCRIT: 25.6 % — ABNORMAL LOW (ref 36.0–46.0)
MEAN CORPUSCULAR HEMOGLOBIN: 29.9 pg (ref 26.0–34.0)
MEAN CORPUSCULAR VOLUME: 92 fL (ref 80.0–100.0)
MEAN PLATELET VOLUME: 12.1 fL — ABNORMAL HIGH (ref 7.0–10.0)
NUCLEATED RED BLOOD CELLS: 8 /100{WBCs} — ABNORMAL HIGH (ref <=4)
RED BLOOD CELL COUNT: 2.78 10*12/L — ABNORMAL LOW (ref 4.00–5.20)
RED CELL DISTRIBUTION WIDTH: 23.3 % — ABNORMAL HIGH (ref 12.0–15.0)
WBC ADJUSTED: 1.3 10*9/L — ABNORMAL LOW (ref 4.5–11.0)

## 2018-04-28 LAB — HEPARIN CORRELATION: Lab: 0.2

## 2018-04-28 LAB — INR: Lab: 1.12

## 2018-04-28 LAB — HEMATOCRIT: Lab: 25.6 — ABNORMAL LOW

## 2018-04-28 LAB — D-DIMER QUANTITATIVE (CH,ML,PD,ET): Lab: 2384 — ABNORMAL HIGH

## 2018-04-28 LAB — PHOSPHORUS: Phosphate:MCnc:Pt:Ser/Plas:Qn:: 3.5

## 2018-04-28 LAB — MAGNESIUM: Magnesium:MCnc:Pt:Ser/Plas:Qn:: 1.7

## 2018-04-28 LAB — LACTATE DEHYDROGENASE: Lactate dehydrogenase:CCnc:Pt:Ser/Plas:Qn:: 1104 — ABNORMAL HIGH

## 2018-04-28 LAB — FIBRINOGEN LEVEL: Lab: 378

## 2018-04-28 LAB — EOSINOPHILS - ABS (DIFF): Lab: 0.1

## 2018-04-28 LAB — APTT: APTT: 35.4 s (ref 25.9–39.5)

## 2018-04-28 LAB — EGFR CKD-EPI NON-AA FEMALE: Lab: >90

## 2018-04-28 LAB — URIC ACID: Urate:MCnc:Pt:Ser/Plas:Qn:: 2.2 — ABNORMAL LOW

## 2018-04-28 MED ORDER — ACETAMINOPHEN 325 MG PO TABS
650.00 | ORAL_TABLET | ORAL | Status: DC
Start: ? — End: 2018-04-28

## 2018-04-28 MED ORDER — GENERIC EXTERNAL MEDICATION
Status: DC
Start: ? — End: 2018-04-28

## 2018-04-28 MED ORDER — VALACYCLOVIR HCL 500 MG PO TABS
500.00 | ORAL_TABLET | ORAL | Status: DC
Start: 2018-05-01 — End: 2018-04-28

## 2018-04-28 MED ORDER — SODIUM CHLORIDE 0.9 % IV SOLN
1000.00 | INTRAVENOUS | Status: DC
Start: ? — End: 2018-04-28

## 2018-04-28 MED ORDER — DIPHENHYDRAMINE HCL 50 MG/ML IJ SOLN
25.00 | INTRAMUSCULAR | Status: DC
Start: ? — End: 2018-04-28

## 2018-04-28 MED ORDER — ALLOPURINOL 300 MG PO TABS
300.00 | ORAL_TABLET | ORAL | Status: DC
Start: 2018-05-01 — End: 2018-04-28

## 2018-04-28 MED ORDER — POLYETHYLENE GLYCOL 3350 17 G PO PACK
17.00 | PACK | ORAL | Status: DC
Start: ? — End: 2018-04-28

## 2018-04-28 MED ORDER — MEPERIDINE HCL 25 MG/ML IJ SOLN
25.00 | INTRAMUSCULAR | Status: DC
Start: ? — End: 2018-04-28

## 2018-04-28 MED ORDER — VENETOCLAX 100 MG PO TABS
400.00 | ORAL_TABLET | ORAL | Status: DC
Start: 2018-04-30 — End: 2018-04-28

## 2018-04-28 MED ORDER — METHYLPREDNISOLONE SODIUM SUCC 125 MG IJ SOLR
125.00 | INTRAMUSCULAR | Status: DC
Start: ? — End: 2018-04-28

## 2018-04-28 MED ORDER — ALBUTEROL SULFATE (2.5 MG/3ML) 0.083% IN NEBU
2.50 | INHALATION_SOLUTION | RESPIRATORY_TRACT | Status: DC
Start: ? — End: 2018-04-28

## 2018-04-28 MED ORDER — AMLODIPINE BESYLATE 5 MG PO TABS
5.00 | ORAL_TABLET | ORAL | Status: DC
Start: 2018-05-01 — End: 2018-04-28

## 2018-04-28 MED ORDER — LISINOPRIL 10 MG PO TABS
10.00 | ORAL_TABLET | ORAL | Status: DC
Start: 2018-05-01 — End: 2018-04-28

## 2018-04-28 MED ORDER — DEXTROSE 10 % IV SOLN
25.00 | INTRAVENOUS | Status: DC
Start: ? — End: 2018-04-28

## 2018-04-28 MED ORDER — MELATONIN 3 MG PO TABS
3.00 | ORAL_TABLET | ORAL | Status: DC
Start: 2018-04-30 — End: 2018-04-28

## 2018-04-28 MED ORDER — POLYETHYLENE GLYCOL 3350 17 G PO PACK
17.00 | PACK | ORAL | Status: DC
Start: 2018-05-01 — End: 2018-04-28

## 2018-04-28 MED ORDER — INSULIN GLARGINE 100 UNIT/ML ~~LOC~~ SOLN
4.00 | SUBCUTANEOUS | Status: DC
Start: 2018-04-30 — End: 2018-04-28

## 2018-04-28 MED ORDER — GENERIC EXTERNAL MEDICATION
1.00 | Status: DC
Start: 2018-04-30 — End: 2018-04-28

## 2018-04-28 MED ORDER — SODIUM CHLORIDE 0.9 % IV SOLN
20.00 | INTRAVENOUS | Status: DC
Start: ? — End: 2018-04-28

## 2018-04-28 MED ORDER — ONDANSETRON HCL 4 MG/2ML IJ SOLN
8.00 | INTRAMUSCULAR | Status: DC
Start: ? — End: 2018-04-28

## 2018-04-28 MED ORDER — ONDANSETRON HCL 8 MG PO TABS
16.00 | ORAL_TABLET | ORAL | Status: DC
Start: 2018-04-29 — End: 2018-04-28

## 2018-04-28 MED ORDER — FLUTICASONE-SALMETEROL 100-50 MCG/DOSE IN AEPB
1.00 | INHALATION_SPRAY | RESPIRATORY_TRACT | Status: DC
Start: 2018-04-30 — End: 2018-04-28

## 2018-04-28 MED ORDER — INSULIN LISPRO 100 UNIT/ML ~~LOC~~ SOLN
.00 | SUBCUTANEOUS | Status: DC
Start: 2018-04-30 — End: 2018-04-28

## 2018-04-28 MED ORDER — AZACITIDINE CHEMO INJECTION 100 MG
75.00 | INTRAMUSCULAR | Status: DC
Start: 2018-04-29 — End: 2018-04-28

## 2018-04-28 MED ORDER — FAMOTIDINE 20 MG/2ML IV SOLN
20.00 | INTRAVENOUS | Status: DC
Start: ? — End: 2018-04-28

## 2018-04-28 MED ORDER — LEVOFLOXACIN 500 MG PO TABS
500.00 | ORAL_TABLET | ORAL | Status: DC
Start: 2018-05-01 — End: 2018-04-28

## 2018-04-28 MED ORDER — EPINEPHRINE 0.3 MG/0.3ML IJ SOAJ
0.30 | INTRAMUSCULAR | Status: DC
Start: ? — End: 2018-04-28

## 2018-04-28 MED ORDER — SIMETHICONE 80 MG PO CHEW
80.00 | CHEWABLE_TABLET | ORAL | Status: DC
Start: ? — End: 2018-04-28

## 2018-04-28 MED ORDER — DEXAMETHASONE SODIUM PHOSPHATE 4 MG/ML IJ SOLN
20.00 | INTRAMUSCULAR | Status: DC
Start: ? — End: 2018-04-28

## 2018-04-28 NOTE — Unmapped (Signed)
Pt AxOx4, VSS, afebrile and free of falls/injuries this shift. Pt UOP remains adequate. Foley still in place. Pt resting with no other complaints/concerns. Will continue to monitor.

## 2018-04-28 NOTE — Unmapped (Signed)
Daily Progress Note    24hr Events: No acute events overnight. Pt reports she is doing well. C1D6 of aza/venetoclax therapy. Plan for DC to SNF after day 7 of course. Will discharge with Foley in given incontinence and risk to staff of urine splash (as chemo drug is cleared in urine, staff handling her urine could be at risk).     Assessment/Plan:  Tracey Lang is a 77 y.o. 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     Principal Problem:    Acute myeloid leukemia (CMS-HCC)  Active Problems:    Thrombocytopenia (CMS-HCC)    Type 2 diabetes mellitus, without long-term current use of insulin (CMS-HCC)    Essential hypertension    Hyperlipidemia    GERD (gastroesophageal reflux disease)  Resolved Problems:    * No resolved hospital problems. *    Acute Myeloid Leukemia:   On admission to [facility], WBC 19.8, Hgb 6.1, and platelets 135. No current evidence of tumor lysis or DIC. Patient has had intermittent thrombocytopenia and leukopenia in the past with normal SPEP and free light chains 11/18. Peripheral blood smear on 8/10 showing numerous blasts. 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 Bone marrow biopsy conducted on 8/12 consistent with AML and cytogenetics demonstrate normal karyotype on first 10 metaphases, MLL FISH revealed normal karyotype with no MLL rearrangement. ECOG1 per our assessment. Discussed BEAT AML trial and patient and husband do not wish to meet with coordinator or participate in trial. 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 Started chemo on 8/16 after WBC count fell <10.   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 Home coreg 6.25 mg discontinued in setting of initiation of Venetoclax.  - Home lisinopril 10 mg daily, continue to monitor BP and titrate up as needed  - Start amlodipine 5mg  daily (began on 8/19), can up-titrate if necessary  ??  HLD:  - Hold home simvastatin   ??  T2DM:   BG well controlled on admission but elevated more recently. Yesterday, POC glucose of 231, otherwise well-controlled.  - Increased yesterday to Lantus 4U, to good effect  - Continue to hold home metformin (sole home diabetes medication)  - SSI    Asthma  - Continue home advair and albuterol nebs prn    FEN: General diet  GI ppx: Not indicated  DVT ppx: SCDs  Code Status: Full Code, confirmed with patient on admission  Dispo: Floor, E1 admit  ________________________________________________________________    Subjective:  Pt notes she is doing well this morning. Feels good with no complaints.    Labs/Studies:  Labs and Studies from the last 24hrs per EMR and Reviewed and   All lab results last 24 hours:    Recent  Results (from the past 24 hour(s))   POCT Glucose    Collection Time: 04/27/18  5:08 PM   Result Value Ref Range    Glucose, POC 161 65 - 179 mg/dL   POCT Glucose    Collection Time: 04/27/18  9:16 PM   Result Value Ref Range    Glucose, POC 158 65 - 179 mg/dL   Magnesium Level    Collection Time: 04/28/18  6:34 AM   Result Value Ref Range    Magnesium 1.7 1.6 - 2.2 mg/dL   Basic Metabolic Panel    Collection Time: 04/28/18  6:34 AM   Result Value Ref Range    Sodium 132 (L) 135 - 145 mmol/L    Potassium 3.5 3.5 - 5.0 mmol/L    Chloride 101 98 - 107 mmol/L    CO2 30.0 22.0 - 30.0 mmol/L    Anion Gap 1 (L) 9 - 15 mmol/L    BUN 17 7 - 21 mg/dL    Creatinine 1.61 (L) 0.60 - 1.00 mg/dL    BUN/Creatinine Ratio 36     EGFR CKD-EPI Non-African American, Female >90 >=60 mL/min/1.75m2    EGFR CKD-EPI African American, Female >90 >=60 mL/min/1.54m2    Glucose 119 65 - 179 mg/dL    Calcium 8.7 8.5 - 09.6 mg/dL   Lactate dehydrogenase    Collection Time: 04/28/18  6:34 AM   Result Value Ref Range    LDH 1,104 (H) 338 - 610 U/L   Phosphorus Level    Collection Time: 04/28/18  6:34 AM   Result Value Ref Range    Phosphorus 3.5 2.9 - 4.7 mg/dL   Uric acid    Collection Time: 04/28/18  6:34 AM   Result Value Ref Range    Uric Acid 2.2 (L) 3.0 - 6.5 mg/dL   CBC w/ Differential    Collection Time: 04/28/18  6:34 AM   Result Value Ref Range    WBC 1.3 (L) 4.5 - 11.0 10*9/L    RBC 2.78 (L) 4.00 - 5.20 10*12/L    HGB 8.3 (L) 12.0 - 16.0 g/dL    HCT 04.5 (L) 40.9 - 46.0 %    MCV 92.0 80.0 - 100.0 fL    MCH 29.9 26.0 - 34.0 pg    MCHC 32.6 31.0 - 37.0 g/dL    RDW 81.1 (H) 91.4 - 15.0 %    MPV 12.1 (H) 7.0 - 10.0 fL    Platelet 61 (L) 150 - 440 10*9/L    nRBC 8 (H) <=4 /100 WBCs    Microcytosis Slight (A) Not Present    Macrocytosis Moderate (A) Not Present    Anisocytosis Marked (A) Not Present    Hypochromasia Slight (A) Not Present   aPTT    Collection Time: 04/28/18  6:34 AM   Result Value Ref Range    APTT 35.4 25.9 - 39.5 sec    Heparin Correlation 0.2    PT-INR    Collection Time: 04/28/18  6:34 AM   Result Value Ref Range    PT 12.8 10.2 - 12.8 sec    INR 1.12    Fibrinogen    Collection Time: 04/28/18  6:34 AM   Result Value Ref Range    Fibrinogen 378 177 - 386 mg/dL   D-Dimer, Quantitative    Collection Time: 04/28/18  6:34 AM   Result Value Ref Range    D-Dimer 2,384 (H) <230 ng/mL DDU   Manual Differential  Collection Time: 04/28/18  6:34 AM   Result Value Ref Range    Neutrophils % 5 %    Lymphocytes % 46 %    Monocytes % 2 % Eosinophils % 10 %    Basophils % 1 %    Blasts % 36 (HH) <=0 %    Absolute Neutrophils 0.1 (LL) 2.0 - 7.5 10*9/L    Absolute Lymphocytes 0.6 (L) 1.5 - 5.0 10*9/L    Absolute Monocytes 0.0 (L) 0.2 - 0.8 10*9/L    Absolute Eosinophils 0.1 0.0 - 0.4 10*9/L    Absolute Basophils 0.0 0.0 - 0.1 10*9/L    Smear Review Comments See Comment (A) Undefined   POCT Glucose    Collection Time: 04/28/18  7:59 AM   Result Value Ref Range    Glucose, POC 114 65 - 179 mg/dL   POCT Glucose    Collection Time: 04/28/18 11:28 AM   Result Value Ref Range    Glucose, POC 171 65 - 179 mg/dL     Objective:  Temp:  [36.4 ??C-37.1 ??C] 37.1 ??C  Heart Rate:  [80-101] 101  Resp:  [16] 16  BP: (130-147)/(58-76) 147/64  SpO2:  [95 %-100 %] 96 %    General: in no acute distress, laying in bed  HEENT: NCAT. No scleral icterus. Oropharynx moist.   RESP: breathing comfortably on room air; speaking in full sentences without dyspnea  CV: RRR with normal S1, S2. No murmurs, rubs or gallops noted  ABD: NABS, soft, non-distended, non-tender to palpation throughout  EXT: No lower extremity edema, cyanosis, or clubbing  SKIN: Large superficial ecchymosis on left lateral thigh and left gluteal region improving daily, no evidence of hematoma; non-tender to palpation; some mild erythema without warmth or fluctuance in site of chemo administration on abdomen  NEURO: No focal neurologic deficits, AOx3

## 2018-04-29 LAB — BASIC METABOLIC PANEL
ANION GAP: 3 mmol/L — ABNORMAL LOW (ref 9–15)
CALCIUM: 8.8 mg/dL (ref 8.5–10.2)
CO2: 29 mmol/L (ref 22.0–30.0)
CREATININE: 0.47 mg/dL — ABNORMAL LOW (ref 0.60–1.00)
EGFR CKD-EPI AA FEMALE: >90 mL/min/{1.73_m2} (ref >=60)
EGFR CKD-EPI NON-AA FEMALE: >90 mL/min/{1.73_m2} (ref >=60)
GLUCOSE RANDOM: 121 mg/dL (ref 65–179)
POTASSIUM: 3.4 mmol/L — ABNORMAL LOW (ref 3.5–5.0)
SODIUM: 133 mmol/L — ABNORMAL LOW (ref 135–145)

## 2018-04-29 LAB — CBC W/ AUTO DIFF
BASOPHILS ABSOLUTE COUNT: 0 10*9/L (ref 0.0–0.1)
BASOPHILS RELATIVE PERCENT: 0.6 %
EOSINOPHILS ABSOLUTE COUNT: 0.1 10*9/L (ref 0.0–0.4)
EOSINOPHILS RELATIVE PERCENT: 7.7 %
HEMATOCRIT: 27.7 % — ABNORMAL LOW (ref 36.0–46.0)
LARGE UNSTAINED CELLS: 7 % — ABNORMAL HIGH (ref 0–4)
LYMPHOCYTES ABSOLUTE COUNT: 0.9 10*9/L — ABNORMAL LOW (ref 1.5–5.0)
LYMPHOCYTES RELATIVE PERCENT: 68.3 %
MEAN CORPUSCULAR HEMOGLOBIN CONC: 31.7 g/dL (ref 31.0–37.0)
MEAN CORPUSCULAR VOLUME: 93.1 fL (ref 80.0–100.0)
MEAN PLATELET VOLUME: 11.5 fL — ABNORMAL HIGH (ref 7.0–10.0)
MONOCYTES ABSOLUTE COUNT: 0 10*9/L — ABNORMAL LOW (ref 0.2–0.8)
MONOCYTES RELATIVE PERCENT: 3.5 %
NEUTROPHILS ABSOLUTE COUNT: 0.2 10*9/L — CL (ref 2.0–7.5)
NEUTROPHILS RELATIVE PERCENT: 12.8 %
RED BLOOD CELL COUNT: 2.97 10*12/L — ABNORMAL LOW (ref 4.00–5.20)
RED CELL DISTRIBUTION WIDTH: 23.4 % — ABNORMAL HIGH (ref 12.0–15.0)
WBC ADJUSTED: 1.3 10*9/L — ABNORMAL LOW (ref 4.5–11.0)

## 2018-04-29 LAB — NEUTROPHILS ABSOLUTE COUNT: Lab: 0.2 — CL

## 2018-04-29 LAB — HEPATIC FUNCTION PANEL
ALBUMIN: 2.9 g/dL — ABNORMAL LOW (ref 3.5–5.0)
ALKALINE PHOSPHATASE: 91 U/L (ref 38–126)
ALT (SGPT): 18 U/L (ref 15–48)
AST (SGOT): 29 U/L (ref 14–38)
BILIRUBIN TOTAL: 1.5 mg/dL — ABNORMAL HIGH (ref 0.0–1.2)

## 2018-04-29 LAB — FIBRINOGEN LEVEL: Lab: 391 — ABNORMAL HIGH

## 2018-04-29 LAB — PHOSPHORUS: Phosphate:MCnc:Pt:Ser/Plas:Qn:: 3.2

## 2018-04-29 LAB — APTT
APTT: 35.4 s (ref 25.9–39.5)
Coagulation surface induced:Time:Pt:PPP:Qn:Coag: 35.4

## 2018-04-29 LAB — PROTIME-INR: INR: 1.14

## 2018-04-29 LAB — MAGNESIUM: Magnesium:MCnc:Pt:Ser/Plas:Qn:: 1.7

## 2018-04-29 LAB — D-DIMER QUANTITATIVE (CH,ML,PD,ET): Lab: 2072 — ABNORMAL HIGH

## 2018-04-29 LAB — URIC ACID: Urate:MCnc:Pt:Ser/Plas:Qn:: 1.9 — ABNORMAL LOW

## 2018-04-29 LAB — ALT (SGPT): Alanine aminotransferase:CCnc:Pt:Ser/Plas:Qn:: 18

## 2018-04-29 LAB — INR: Lab: 1.14

## 2018-04-29 LAB — EGFR CKD-EPI AA FEMALE: Lab: >90

## 2018-04-29 LAB — LACTATE DEHYDROGENASE: Lactate dehydrogenase:CCnc:Pt:Ser/Plas:Qn:: 1052 — ABNORMAL HIGH

## 2018-04-29 MED ORDER — POLYETHYLENE GLYCOL 3350 17 GRAM ORAL POWDER PACKET
PACK | Freq: Every day | ORAL | 0 refills | 0 days
Start: 2018-04-29 — End: ?

## 2018-04-29 MED ORDER — SENNOSIDES 8.6 MG TABLET
ORAL_TABLET | Freq: Every evening | ORAL | 0 refills | 0 days | Status: SS
Start: 2018-04-29 — End: 2018-06-03

## 2018-04-29 MED ORDER — AMLODIPINE 5 MG TABLET
ORAL_TABLET | Freq: Every day | ORAL | 2 refills | 0.00000 days | Status: SS
Start: 2018-04-29 — End: 2018-06-03

## 2018-04-29 MED ORDER — LEVOFLOXACIN 500 MG TABLET
ORAL_TABLET | ORAL | 0 refills | 0.00000 days
Start: 2018-04-29 — End: 2018-05-17

## 2018-04-29 MED ORDER — ACETAMINOPHEN 325 MG TABLET
ORAL_TABLET | Freq: Four times a day (QID) | ORAL | 0 refills | 0 days | Status: SS | PRN
Start: 2018-04-29 — End: 2018-06-08

## 2018-04-29 MED ORDER — VALACYCLOVIR 500 MG TABLET
ORAL_TABLET | Freq: Every day | ORAL | 0 refills | 0.00000 days
Start: 2018-04-29 — End: ?

## 2018-04-29 MED ORDER — SIMETHICONE 80 MG CHEWABLE TABLET
ORAL_TABLET | Freq: Four times a day (QID) | ORAL | 0 refills | 0.00000 days | Status: SS | PRN
Start: 2018-04-29 — End: 2018-06-03

## 2018-04-29 MED FILL — ACETAMINOPHEN 325 MG TABLET: ORAL | 15 days supply | Qty: 120 | Fill #0

## 2018-04-29 MED FILL — VENCLEXTA 100 MG TABLET: 30 days supply | Qty: 120 | Fill #0 | Status: AC

## 2018-04-29 MED FILL — ACETAMINOPHEN 325 MG TABLET: 15 days supply | Qty: 120 | Fill #0 | Status: AC

## 2018-04-29 NOTE — Unmapped (Signed)
Daily Progress Note    24hr Events: No acute events overnight. Pt reports she is doing well. C1D6 of aza/venetoclax therapy. Plan for DC to SNF after day 7 of course. Will discharge with Foley in given incontinence and risk to staff of urine splash (as chemo drug is cleared in urine, staff handling her urine could be at risk).     Assessment/Plan:  Tracey Lang is a 77 y.o. 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     Principal Problem:    Acute myeloid leukemia (CMS-HCC)  Active Problems:    Thrombocytopenia (CMS-HCC)    Type 2 diabetes mellitus, without long-term current use of insulin (CMS-HCC)    Essential hypertension    Hyperlipidemia    GERD (gastroesophageal reflux disease)  Resolved Problems:    * No resolved hospital problems. *    Acute Myeloid Leukemia:   On admission to [facility], WBC 19.8, Hgb 6.1, and platelets 135. No current evidence of tumor lysis or DIC. Patient has had intermittent thrombocytopenia and leukopenia in the past with normal SPEP and free light chains 11/18. Peripheral blood smear on 8/10 showing numerous blasts. 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 Bone marrow biopsy conducted on 8/12 consistent with AML and cytogenetics demonstrate normal karyotype on first 10 metaphases, MLL FISH revealed normal karyotype with no MLL rearrangement. ECOG1 per our assessment. Discussed BEAT AML trial and patient and husband do not wish to meet with coordinator or participate in trial. 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 Started chemo on 8/16 after WBC count fell <10.   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 Home coreg 6.25 mg discontinued in setting of initiation of Venetoclax.  - Home lisinopril 10 mg daily, continue to monitor BP and titrate up as needed  - Start amlodipine 5mg  daily (began on 8/19), can up-titrate if necessary  ??  HLD:  - Hold home simvastatin   ??  T2DM:   BG well controlled on admission but elevated more recently.  - Lantus 4U, to good effect  - Continue to hold home metformin (sole home diabetes medication)  - SSI    Asthma  - Continue home advair and albuterol nebs prn    FEN: General diet  GI ppx: Not indicated  DVT ppx: SCDs  Code Status: Full Code, confirmed with patient on admission  Dispo: Floor, E1 admit  ________________________________________________________________    Subjective:  Pt notes she is doing well this morning. Ready to go to SNF.    Labs/Studies:  Labs and Studies from the last 24hrs per EMR and Reviewed and   All lab results last 24 hours:    Recent Results (from the past 24 hour(s))   POCT Glucose  Collection Time: 04/28/18  5:08 PM   Result Value Ref Range    Glucose, POC 201 (H) 65 - 179 mg/dL   POCT Glucose    Collection Time: 04/28/18  8:58 PM   Result Value Ref Range    Glucose, POC 154 65 - 179 mg/dL   Magnesium Level    Collection Time: 04/29/18  6:16 AM   Result Value Ref Range    Magnesium 1.7 1.6 - 2.2 mg/dL   Hepatic Function Panel    Collection Time: 04/29/18  6:16 AM   Result Value Ref Range    Albumin 2.9 (L) 3.5 - 5.0 g/dL    Total Protein 6.7 6.5 - 8.3 g/dL    Total Bilirubin 1.5 (H) 0.0 - 1.2 mg/dL    Bilirubin, Direct <9.14 0.00 - 0.40 mg/dL    AST 29 14 - 38 U/L    ALT 18 15 - 48 U/L    Alkaline Phosphatase 91 38 - 126 U/L   Basic Metabolic Panel    Collection Time: 04/29/18  6:16 AM   Result Value Ref Range    Sodium 133 (L) 135 - 145 mmol/L    Potassium 3.4 (L) 3.5 - 5.0 mmol/L    Chloride 101 98 - 107 mmol/L    CO2 29.0 22.0 - 30.0 mmol/L    Anion Gap 3 (L) 9 - 15 mmol/L    BUN 18 7 - 21 mg/dL    Creatinine 7.82 (L) 0.60 - 1.00 mg/dL    BUN/Creatinine Ratio 38     EGFR CKD-EPI Non-African American, Female >90 >=60 mL/min/1.1m2    EGFR CKD-EPI African American, Female >90 >=60 mL/min/1.58m2    Glucose 121 65 - 179 mg/dL    Calcium 8.8 8.5 - 95.6 mg/dL   Lactate dehydrogenase    Collection Time: 04/29/18  6:16 AM   Result Value Ref Range    LDH 1,052 (H) 338 - 610 U/L   Phosphorus Level    Collection Time: 04/29/18  6:16 AM   Result Value Ref Range    Phosphorus 3.2 2.9 - 4.7 mg/dL   Uric acid    Collection Time: 04/29/18  6:16 AM   Result Value Ref Range    Uric Acid 1.9 (L) 3.0 - 6.5 mg/dL   CBC w/ Differential    Collection Time: 04/29/18  6:16 AM   Result Value Ref Range    WBC 1.3 (L) 4.5 - 11.0 10*9/L    RBC 2.97 (L) 4.00 - 5.20 10*12/L    HGB 8.8 (L) 12.0 - 16.0 g/dL    HCT 21.3 (L) 08.6 - 46.0 %    MCV 93.1 80.0 - 100.0 fL    MCH 29.6 26.0 - 34.0 pg    MCHC 31.7 31.0 - 37.0 g/dL    RDW 57.8 (H) 46.9 - 15.0 %    MPV 11.5 (H) 7.0 - 10.0 fL    Platelet 48 (L) 150 - 440 10*9/L    Neutrophils % 12.8 %    Lymphocytes % 68.3 %    Monocytes % 3.5 %    Eosinophils % 7.7 %    Basophils % 0.6 %    Absolute Neutrophils 0.2 (LL) 2.0 - 7.5 10*9/L    Absolute Lymphocytes 0.9 (L) 1.5 - 5.0 10*9/L    Absolute Monocytes 0.0 (L) 0.2 - 0.8 10*9/L    Absolute Eosinophils 0.1 0.0 - 0.4 10*9/L    Absolute Basophils 0.0 0.0 - 0.1 10*9/L    Large  Unstained Cells 7 (H) 0 - 4 %    Microcytosis Slight (A) Not Present    Macrocytosis Moderate (A) Not Present    Anisocytosis Marked (A) Not Present    Hypochromasia Marked (A) Not Present   aPTT    Collection Time: 04/29/18  6:16 AM   Result Value Ref Range    APTT 35.4 25.9 - 39.5 sec    Heparin Correlation 0.2    PT-INR    Collection Time: 04/29/18  6:16 AM   Result Value Ref Range    PT 13.0 (H) 10.2 - 12.8 sec    INR 1.14    Fibrinogen    Collection Time: 04/29/18  6:16 AM   Result Value Ref Range    Fibrinogen 391 (H) 177 - 386 mg/dL   D-Dimer, Quantitative    Collection Time: 04/29/18  6:16 AM   Result Value Ref Range    D-Dimer 2,072 (H) <230 ng/mL DDU   POCT Glucose    Collection Time: 04/29/18  8:42 AM   Result Value Ref Range    Glucose, POC 106 65 - 179 mg/dL   POCT Glucose    Collection Time: 04/29/18 11:52 AM   Result Value Ref Range    Glucose, POC 183 (H) 65 - 179 mg/dL     Objective:  Temp:  [36.3 ??C-37 ??C] 37 ??C  Heart Rate:  [83-101] 101  Resp:  [16-20] 18  BP: (122-146)/(59-80) 139/68  SpO2:  [96 %-98 %] 96 %    General: in no acute distress, laying in bed with husband at bedside  HEENT: NCAT. No scleral icterus. Oropharynx moist.   RESP: breathing comfortably on room air; speaking in full sentences without dyspnea  ABD: NABS, soft, non-distended, non-tender to palpation throughout  EXT: No lower extremity edema, cyanosis, or clubbing  SKIN: Large resolving superficial ecchymosis on left lateral thigh and left gluteal region, no evidence of hematoma; non-tender to palpation; some mild erythema without warmth or fluctuance in site of chemo administration on abdomen  NEURO: No focal neurologic deficits, AOx3

## 2018-04-29 NOTE — Unmapped (Signed)
VSS, afebrile.  Pt states she feels well, endorses fatigue.  Day 7 of 7 azacitidine given today, pt tolerated well.  Full shower given, pt tolerated well.  Pt's d/c to SNF postponed d/t insurance delays per case manager.  WCTM.

## 2018-04-29 NOTE — Unmapped (Signed)
VSS, afebrile, pt able to rest well with minimum interruption. Uneventful PM shift. Pt will be D7 @ 1800 today. WCTM and provide intervention as appropriate.

## 2018-04-29 NOTE — Unmapped (Signed)
Afebrile, VSS.  Day 6 of azacitidine SQ, pt tolerated well.  Pt worked with OT this AM, OOB to chair for one hour, tolerated well.  Pt ate meal sitting at SOB with family at bedside.  Pt free from falls/injuries, bed alarm continued.  Foley care performed per policy.  Plan to d/c to SNF tomorrow barring any setbacks per MDE1.  Will CTM.

## 2018-04-29 NOTE — Unmapped (Addendum)
Pharmacist Discharge Note  Reason for writing this note: new diagnosis with new medication    Tracey Lang is a 77 y.o. 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 Discharge date = 04/29/18. The following is a summary of medication-related changes that occurred during admission, as well as any pharmacy-related follow-up and medication access needs.     AML:  - s/p treatment with venetoclax/azacitadine, D1 = 04/23/18; Tracey Lang' s hospitalization was complicated by anemia prior to treatment (multiple RBC transfusions) and urinary incontinence w/ foley catheter placement, which will be removed at SNF  - Discharged on valacyclovir and levofloxacin for prophylaxis.    Highlighted Medication Changes (with rationale, if applicable):  - Carvedilol was discontinued due to interaction with venetoclax; started on amlodipine 5 mg once daily and continued on lisinopril 10 mg daily.  - Home simvastatin discontinued for potential interaction with posaconazole (future antifungal ppx)    Appointments and monitoring:  Future Appointments   Date Time Provider Department Center   05/18/2018  9:30 AM ADULT ONC LAB [facility]   05/18/2018 10:30 AM Vernie Murders, AGNP [id number]   05/25/2018 11:45 AM ADULT ONC LAB [facility]   05/25/2018 12:30 PM Pernell Dupre, MD [id number]     Medication access  - Grant obtained to cover copay of venetoclax  from [facility]. Patient will receive first dose from [facility] and refills via home delivery.  - Posaconazole access - PA approved; Copay is $1,803.35, Rx sent to [facility] 8/19. Dr Lonni Fix to sign mfr paperwork upon her return ~8/26.    Outpatient Pharmacy Follow-Up:   [ ]  Once started on posaconazole, dose adjust venetoclax    Please page with questions,    Rich Fuchs, PharmD  PGY2 Hematology/Oncology Pharmacy Resident

## 2018-04-29 NOTE — Unmapped (Signed)
Physician Discharge Summary [facility]  4 ONC [facility]  [address]  [place] [address]  Dept: [phone]  Loc: [phone]     Identifying Information:   Tracey Lang  04-Dec-1940  [id number]    Primary Care Physician: [facility]     Referring Physician: Referred Self     Code Status: Full Code    Admit Date: 04/16/2018    Discharge Date: 04/30/2018     Discharge To: Skilled nursing facility    Discharge Service: MDE - Hematology Teaching     Discharge Attending Physician: Vito Berger, MD    Discharge Diagnoses:  Principal Problem:    Acute myeloid leukemia (CMS-HCC)  Active Problems:    Thrombocytopenia (CMS-HCC)    Type 2 diabetes mellitus, without long-term current use of insulin (CMS-HCC)    Essential hypertension    Hyperlipidemia    GERD (gastroesophageal reflux disease)  Resolved Problems:    * No resolved hospital problems. *      Outpatient Provider Follow Up Issues:     PCP Issues: Patient's home coreg was discontinued and she was initiated on amlodipine 5mg  daily instead (as carvedilol interacts with her chemotherapy, venetoclax). This can be up-titrated as needed for blood pressure control. Additionally, her home simvastatin was held on discharge given that it interferes with her prophylactic posaconazole. This can be restarted after cancer treatment is finished.    Supportive Care Recommendations:  We recommend based on the patient???s underlying diagnosis and treatment history the following supportive care:    1. 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 Blood product support:  Leukoreduced blood products are required.  Irradiated blood products are preferred, but in case of urgent transfusion needs non-irradiated blood products may be used:     -  RBC transfusion threshold: transfuse 1 units for Hgb < 7 g/dL.  -  Platelet transfusion threshold: transfuse 1 unit of platelets for platelet count < 10, or for bleeding or need for invasive procedure.    Based on the patient's disease status and intensity of therapy, complete blood count with differential should be evaluated 3 times per week and used to guide transfusion support    3. 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    Acute Myeloid Leukemia:   On admission to [facility], WBC 19.8, Hgb 6.1, and platelets 135 with no evidence of tumor lysis or DIC.??Patient has had intermittent thrombocytopenia and leukopenia in the past with normal SPEP and free light chains (11/18). Peripheral blood smear done on 8/10 showing numerous blasts. 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 Bone marrow biopsy was conducted on 8/12 and resulted consistent with AML. Cytogenetics demonstrate normal karyotype on first 10 metaphases and MLL FISH revealed normal karyotype with no MLL rearrangement. Per assessment, ECOG1. Patient declined participation in BEAT AML trial. 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 She was started on a chemotherapy regimen of AZA/Venetoclax for a 7 day inpatient course (8/16 - 8/22) after WBC count fell <10. She was monitored for TLS and labs were unremarkable. 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 Transfusion guidelines were established as follows: Hgb <7, platelets <10. Allopurinol was not prescribed on discharge given that she is out of the window of time during which tumor lysis syndrome would develop. Additionally, posaconazole was discontinued on discharge given that it is no longer indicated for prophylaxis.  ??  HTN:   Patient's home Coreg 6.25 mg was discontinued in setting of initiation of Venetoclax (given that Coreg interferes with Venetoclax dosing). Instead, she was continued on her home lisinopril 10mg  QD started on amlodipine 5mg  QD, which was effective in controlling blood pressure.  ??  HLD:  Her home simvastatin was held throughout admission and on discharge, as it interferes with the posaconazole.  ??  T2DM:   BG well controlled on admission but elevated more recently. Patient placed on standing Lantus in setting of poor control with good effect. Home metformin, the patient's sole home diabetes medication, was held throughout hospitalization.     Right Wrist Pain, Erythema, resolved:   Patient had a fall in early June with fracture of right wrist. Was admitted to [facility] 6/5-6/8, MRI of the right wrist showed an abscess and patient underwent I&D. Blood cultures were positive for MSSA. TTE was negative for valvular vegetations and repeat blood cultures were negative on 02/12/18. Patient was started on IV ancef through 7/22, then transitioned to PO keflex which patient did not tolerate and discontinued on her own on 8/08. Pt denies pain and has active range of motion in right wrist. No evidence of active infection, however, was documented to be erythematous in ED. MRI R wrist was obtained on 8/12 and showed no evidence for soft tissue swelling or drainable fluid collection, suggesting pain is secondary to TFCC tear/degeneration. IV ancef was restarted on admission, but discontinued following MRI results (8/10 - 8/12).    Procedures:  Biopsy: Bone Marrow and Chemotherapy  No admission procedures for hospital encounter.  ______________________________________________________________________  Discharge Medications:     Your Medication List      STOP taking these medications    carvedilol 6.25 MG tablet  Commonly known as:  COREG     ferrous sulfate 325 (65 FE) MG tablet     simvastatin 20 MG tablet  Commonly known as:  ZOCOR        START taking these medications    amLODIPine 5 MG tablet  Commonly known as:  NORVASC  Take 1 tablet (5 mg total) by mouth daily.     levoFLOXacin 500 MG tablet  Commonly known as:  LEVAQUIN  Take 1 tablet (500 mg total) by mouth daily.     polyethylene glycol 17 gram packet  Commonly known as:  MIRALAX  Take 17 g by mouth daily.     senna 8.6 mg tablet  Commonly known as:  SENOKOT  Take 1 tablet by mouth nightly.     simethicone 80 MG chewable tablet  Commonly known as:  MYLICON  Chew 1 tablet (80 mg total) every six (6) hours as needed.     valACYclovir 500 MG tablet  Commonly known as:  VALTREX  Take 1 tablet (500 mg total) by mouth daily.     VENCLEXTA 100 mg tablet  Generic drug:  venetoclax  Take 4 tablets (400 mg total) by mouth daily. Take with a meal and water. Do not chew, crush, or break tablets.        CHANGE how you take these medications    acetaminophen 325 MG tablet  Commonly known as:  TYLENOL  Take 2 tablets (650 mg total) by mouth every six (6) hours as needed.  What changed:    ?? medication strength  ?? how much to take  ?? reasons to take this        CONTINUE taking these medications    ADVAIR DISKUS 100-50 mcg/dose diskus  Generic drug:  fluticasone propion-salmeterol  Inhale 1 puff Two (2) times a day.     albuterol 90 mcg/actuation inhaler  Commonly known as:  PROVENTIL HFA;VENTOLIN HFA  Inhale 2 puffs every six (6) hours as needed for wheezing.     carboxymethylcellulose 1 % ophthalmic solution  Administer 1 drop to both eyes Three (3) times a day.     ESTROVEN MAXIMUM STRENGTH ORAL  Take 1 tablet by mouth daily.     latanoprost 0.005 % ophthalmic solution  Commonly known as:  XALATAN  Administer 1 drop to the right eye nightly.     lisinopril 10 MG tablet  Commonly known as:  PRINIVIL,ZESTRIL  Take 10 mg by mouth daily.     metFORMIN 850 MG tablet  Commonly known as:  GLUCOPHAGE  Take 850 mg by mouth 2 (two) times a day with meals.     multivitamin per tablet  Commonly known as:  TAB-A-VITE/THERAGRAN  Take 1 tablet by mouth daily.     pantoprazole 40 MG tablet  Commonly known as:  PROTONIX  Take 40 mg by mouth Two (2) times a day.          Allergies:  Hydrocodone  ______________________________________________________________________  Pending Test Results (if blank, then none):   Order Current Status    Cytogenetics AP Order Preliminary result        Most Recent Labs:  All lab results last 24 hours -   Recent Results (from the past 24 hour(s))   POCT Glucose    Collection Time: 04/29/18  5:21 PM   Result Value Ref Range    Glucose, POC 136 65 - 179 mg/dL   POCT Glucose    Collection Time: 04/29/18  8:52 PM   Result Value Ref Range    Glucose, POC 258 (H) 65 - 179 mg/dL   Magnesium Level    Collection Time: 04/30/18  6:43 AM   Result Value Ref Range    Magnesium 1.6 1.6 - 2.2 mg/dL   D-Dimer, Quantitative    Collection Time: 04/30/18  6:43 AM   Result Value Ref Range    D-Dimer 2,103 (H) <230 ng/mL DDU   Basic Metabolic Panel    Collection Time: 04/30/18  6:43 AM   Result Value Ref Range    Sodium 133 (L) 135 - 145 mmol/L    Potassium 3.4 (L) 3.5 - 5.0 mmol/L    Chloride 101 98 - 107 mmol/L    CO2 26.0 22.0 - 30.0 mmol/L    Anion Gap 6 (L) 9 - 15 mmol/L    BUN 16 7 - 21 mg/dL    Creatinine 1.61 (L) 0.60 - 1.00 mg/dL    BUN/Creatinine Ratio 36     EGFR  CKD-EPI Non-African American, Female >90 >=60 mL/min/1.88m2    EGFR CKD-EPI African American, Female >90 >=60 mL/min/1.60m2    Glucose 110 65 - 179 mg/dL    Calcium 8.9 8.5 - 16.1 mg/dL   Lactate dehydrogenase    Collection Time: 04/30/18  6:43 AM   Result Value Ref Range    LDH 1,051 (H) 338 - 610 U/L   Phosphorus Level    Collection Time: 04/30/18  6:43 AM   Result Value Ref Range    Phosphorus 3.0 2.9 - 4.7 mg/dL   Uric acid    Collection Time: 04/30/18  6:43 AM   Result Value Ref Range    Uric Acid 1.8 (L) 3.0 - 6.5 mg/dL   CBC w/ Differential    Collection Time: 04/30/18  6:43 AM   Result Value Ref Range    WBC 1.0 (L) 4.5 - 11.0 10*9/L    RBC 3.04 (L) 4.00 - 5.20 10*12/L    HGB 9.0 (L) 12.0 - 16.0 g/dL    HCT 09.6 (L) 04.5 - 46.0 %    MCV 93.3 80.0 - 100.0 fL    MCH 29.6 26.0 - 34.0 pg    MCHC 31.8 31.0 - 37.0 g/dL    RDW 40.9 (H) 81.1 - 15.0 %    MPV 10.9 (H) 7.0 - 10.0 fL    Platelet 50 (L) 150 - 440 10*9/L    nRBC 6 (H) <=4 /100 WBCs    Macrocytosis Moderate (A) Not Present    Anisocytosis Marked (A) Not Present    Hypochromasia Marked (A) Not Present   aPTT    Collection Time: 04/30/18  6:43 AM   Result Value Ref Range    APTT 35.1 25.9 - 39.5 sec    Heparin Correlation 0.2    PT-INR    Collection Time: 04/30/18  6:43 AM   Result Value Ref Range    PT 12.7 10.2 - 12.8 sec    INR 1.11    Fibrinogen    Collection Time: 04/30/18  6:43 AM   Result Value Ref Range    Fibrinogen 420 (H) 177 - 386 mg/dL   D-Dimer, Quantitative    Collection Time: 04/30/18  6:43 AM   Result Value Ref Range    D-Dimer 2,200 (H) <230 ng/mL DDU   Manual Differential    Collection Time: 04/30/18  6:43 AM   Result Value Ref Range    Neutrophils % 2 %    Lymphocytes % 54 %    Monocytes % 0 %    Eosinophils % 8 %    Basophils % 2 %    Blasts % 34 (HH) <=0 %    Absolute Neutrophils 0.0 (LL) 2.0 - 7.5 10*9/L    Absolute Lymphocytes 0.5 (L) 1.5 - 5.0 10*9/L    Absolute Monocytes 0.0 (L) 0.2 - 0.8 10*9/L    Absolute Eosinophils 0.1 0.0 - 0.4 10*9/L    Absolute Basophils 0.0 0.0 - 0.1 10*9/L    Smear Review Comments See Comment (A) Undefined   POCT Glucose    Collection Time: 04/30/18  8:00 AM   Result Value Ref Range    Glucose, POC 108 65 - 179 mg/dL   POCT Glucose    Collection Time: 04/30/18 11:05 AM   Result Value Ref Range    Glucose, POC 184 (H) 65 - 179 mg/dL     Microbiology -   Microbiology Results (last day)     ** No results found  for the last 24 hours. **        CBC -   Results in Past 2 Days  Result Component Current Result   WBC 1.0 (L) (04/30/2018)   RBC 3.04 (L) (04/30/2018)   HGB 9.0 (L) (04/30/2018)   HCT 28.3 (L) (04/30/2018)   MCV 93.3 (04/30/2018)   MCH 29.6 (04/30/2018)   MCHC 31.8 (04/30/2018)   MPV 10.9 (H) (04/30/2018)   Platelet 50 (L) (04/30/2018)     Relevant Studies/Radiology (if blank, then none):  Mri Upper Extremity Non-joint Right W Wo Contrast    Result Date: 04/19/2018  EXAM: MRI UPPER EXTREMITY NON-JOINT RIGHT W WO CONTRAST DATE: 04/19/2018 6:54 AM ACCESSION: [id number] UN DICTATED: 04/19/2018 8:04 AM INTERPRETATION LOCATION: [facility] CLINICAL INDICATION: 77 years old Female with Chronic cellulitis/ erythema of R forearm, pt starting chemotherapy soon  COMPARISON: None. TECHNIQUE: MRI of the right forearm was performed before and after administration of IV contrast using a local coil.  Multiplanar, multisequence MR imaging was performed. FINDINGS: The exam is limited secondary to inhomogeneous fat suppression and motion. No abnormal intramuscular signal abnormality. No fluid collection. There is abnormal patchy T2 hyperintense signal with corresponding low T1 signal in the proximal radius. No fracture line is seen. There is abnormal signal within the lunate and multiple carpal bones including the distal scaphoid, and trapezium as well as the first metacarpal base. There is increased T2 signal and capsular enhancement of the wrist, partially imaged at the periphery of the field of view. Abnormal linear T2 signal abnormality in the TFCC. No abnormal signal abnormality in the lateral or medial elbow side on these large field of view images. The biceps tendon is intact. The ulnar nerve is normal in contour and signal. Incidental: Subcutaneous edema along imaged right lateral pelvic soft tissues.     RIGHT forearm: - Decreased sensitivity due to inhomogeneous fat suppression and motion. - Patchy abnormal signal in the proximal radius without evidence for fracture line. Findings are nonspecific and could be reactive/post traumatic though leukemic involvement is on the differential given clinical history. - Diffuse abnormal signal at the wrist including at the first [facility] joint, partially imaged in the periphery of the field of view. Findings are favored to be degenerative but indeterminate and dedicated wrist radiographs are recommended for further assessment. - No evidence for soft tissue swelling or drainable fluid collection. 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    Echocardiogram W Colorflow Spectral Doppler    Result Date: 04/19/2018  ?? Normal left ventricular systolic function, ejection fraction > 55% ?? Diastolic dysfunction - grade II (elevated filling pressures) ?? Degenerative mitral valve disease ?? Mitral regurgitation - mild ?? Mitral annular calcification ?? Dilated left atrium - mild ?? Aortic sclerosis ?? Normal right ventricular systolic function      Ct Body Interpretation Of Outside Film Corky Sox)    Result Date: 04/17/2018  EXAM: CT BODY INTERPRETATION OF OUTSIDE FILM [facility]) DATE: 04/16/2018 9:48 PM ACCESSION: [id number] UN DICTATED: 04/16/2018 9:53 PM INTERPRETATION LOCATION: [facility] CLINICAL INDICATION: R10.9 - Abdominal pain, unspecified abdominal location  COMPARISON: None. TECHNIQUE: A request was received from Dorise Hiss to interpret a CT of the abdomen and pelvis performed at [facility] on 04/16/2018. Images consist of contrast-enhanced axial images. Images were loaded into the [facility] PACS. FINDINGS: LOWER THORAX: Thickening along the minor fissure. HEPATOBILIARY: 1.4 cm left hepatic cyst (2:8). Peripheral calcification noted in the left hepatic lobe, nonspecific finding. The gallbladder is surgically absent. Marked intrahepatic and extrahepatic biliary ductal dilatation. SPLEEN:  Unremarkable. PANCREAS: Unremarkable. ADRENALS: Unremarkable. KIDNEYS/URETERS: Unremarkable. BLADDER: Unremarkable. PELVIC ORGANS: The uterus is surgically absent. No suspicious adnexal masses. GI TRACT: No dilated or thick walled loops of bowel. Colonic diverticulosis. The appendix is not visualized. PERITONEUM/RETROPERITONEUM AND MESENTERY: No free air or fluid. LYMPH NODES: No enlarged lymph nodes. VESSELS: Normal in caliber. BONES AND SOFT TISSUES: No aggressive osseous lesions. Multilevel degenerative change of the spine. Extensive soft tissue swelling and fat stranding in the left lower extremity extending into the posterior spinal soft tissues. 2.8 x 1.7 cm left thigh hematoma (5:52). Partially imaged hematoma in the left bladder (2:79)..     Diffuse soft tissue swelling of the left buttock extending into the left posterior spinal soft tissues with left thigh hematoma, incompletely imaged on this examination. Marked intrahepatic biliary ductal dilatation. No clear obstructing lesion is identified on this examination. Correlate with hepatic function tests and earlier imaging if available. PLEASE NOTE:  Our interpretation of studies performed at an outside institution is limited by factors including the absence of technical specifics of the image, undisclosed clinical information and the unavailability of the original interpretation.  Specialists at the institution that performed the study may have access to information not available to [place] that could make a difference in this interpretation.  We suggest that you obtain the original interpretation from the site where the study was performed.    Ct Neuro Interpretation Of Outside Film [facility] Sox)    Result Date: 04/16/2018  EXAM: Computed tomography, head or brain without contrast material. DATE: 04/16/2018 9:48 PM ACCESSION: [id number] UN DICTATED: 04/16/2018 9:52 PM INTERPRETATION LOCATION: [facility] CLINICAL INDICATION: 77 years old Female with R10.9 - Abdominal pain, unspecified abdominal location  COMPARISON: None TECHNIQUE: Axial CT images of the head  from skull base to vertex without contrast. FINDINGS: Chronic microvascular ischemic changes with global cerebral volume loss and commensurate ventricular dilatation. No evidence for intracranial hemorrhage, hydrocephalus or midline shift.. Preservation of gray-white matter differentiation. No evidence for sulcal effacement. Basilar cisterns are patent. Intracranial atherosclerotic disease. Dural sinuses are normal in attenuation. 1.1 cm rounded calcification along the anterior falx likely reflecting a calcified meningioma. Bilateral globes and retrobulbar structures are intact. Polyp versus mucus retention cyst within an anterior ethmoid left. Mastoid air cells well pneumatized. Osseous structures are intact.     No acute intracranial process identified. Chronic findings as described above. 1.1 cm likely calcified meningioma along the anterior falx.  ______________________________________________________________________  Discharge Instructions:   Please make sure you have a functioning thermometer at home.  If you are feeling poorly, especially if you have chills, shaking, muscle aches or lightheadedness, measure your temperature. If it is more than 100.5 Farenheit, call the nurse triage line during daytime hours (Monday through Friday 8AM???5PM: [phone]) or on nights and weekends, the on-call doctor by calling the hospital operator [phone]) and asking for the on-call adult oncologist. Alternatively, since fever after chemotherapy may be a medical emergency, you may proceed directly to your local emergency room. Inform your provider that you recently received chemotherapy. You may have blood drawn for blood cultures and receive IV antibiotics.    Following discharge from the hospital if you notice the development or worsening of any symptoms such as nausea, vomiting, chest pain, shortness of breath, fevers, or chills, please return to the emergency department.      If you develop these symptoms, or if you have trouble obtaining any of your medications you may call the [facility] Communication Center to speak with the  triage team at [phone] if Monday through Friday 8am-5pm or call [phone] after hours.      For appointments & questions Monday through Friday 8 AM??? 5 PM   please call [phone] or Toll free [phone].    On Nights, Weekends and Holidays  Call [phone] and ask for the oncologist on call.    [facility]  [address]  [place], [place] [address]  [email or web address]     Follow Up instructions and Outpatient Referrals     Call MD for:      Or other signs of serious illness         Call MD for:  extreme fatigue      Call MD for:  persistent nausea or vomiting      Call MD for:  severe uncontrolled pain      Call MD for: Temperature > 38.5 Celsius ( > 101.3 Fahrenheit)      Discharge instructions      Discharge instructions      Ms. Stjames, it was a pleasure caring for you.    You were initially brought to the hospital after a fall with injury to your right wrist. At the hospital, you were found to have elevated white blood cell counts in your blood. You were transferred to [facility] for further evaluation and found to have AML, a type of leukemia. You were given a course of chemotherapy and did well. You were discharged to a skilled nursing facility to recover.     You will need to follow-up with your primary care doctor for a post-hospitalization visit. You will also need follow-up with oncology. The date and time of these appointments will be provided to you.     You were started on several new medications, which are all detailed in this discharge packet.     Please make sure you have a functioning thermometer at home.?? If you are feeling poorly, especially if you have chills, shaking, muscle aches or lightheadedness, measure your temperature. If it is more than 100.5 Farenheit, call the nurse triage line during daytime hours (Monday through Friday 8AM-5PM: [phone]) or on nights and weekends, the on-call doctor by calling the hospital operator [phone]) and asking for the on-call adult oncologist. Alternatively, since fever after chemotherapy may be a medical emergency, you may proceed directly to your local emergency room. Inform your provider that you recently received chemotherapy. You may have blood drawn for blood cultures and receive IV antibiotics.    Following discharge from the hospital if you notice the development or worsening of any symptoms such as nausea, vomiting, chest pain, shortness of breath, fevers, or chills, please return to the emergency department.??     If you develop these symptoms, or if you have trouble obtaining any of your medications you may call the [facility] Communication Center to speak with the triage team at [phone] if Monday through Friday 8am-5pm or call [phone] after hours.      For appointments & questions Monday through Friday 8 AM- 5 PM   please call [phone] or Toll free [phone].    On Nights, Weekends and Holidays  Call [phone] and ask for the oncologist on call.    [facility]  [address]  [place], [place] [address]  [email or web address]         Myeloid Mutation Panel - AML      Myeloid Mutation Panel - AML with FLT3 Testing      Source:  Bone Marrow          Appointments which have been scheduled for you    May 18, 2018  9:30 AM EDT  (Arrive by 9:00 AM)  NURSE LAB DRAW with ADULT ONC LAB  [facility] ADULT ONCOLOGY LAB DRAW STATION [place] [facility] REGION) [address]  [place] [address]  [phone]      May 18, 2018 10:30 AM EDT  (Arrive by 10:00 AM)  BONE MARROW BIOPSY with Vernie Murders, AGNP  [facility]COLOGY INFUSION [facility] REGION) [address]  [place] [address]  [phone]      May 25, 2018 11:45 AM EDT  (Arrive by 11:15 AM)  LAB ONLY [place] with ADULT ONC LAB  [facility] ADULT ONCOLOGY LAB DRAW STATION [facility] REGION) [address]  [place] [address]  [phone]      May 25, 2018 12:30 PM EDT  (Arrive by 12:00 PM)  NEW HEM MALIG ONC [place] with Pernell Dupre, MD  [facility] HEMATOLOGY ONCOLOGY 2ND FLR CANCER HOSP [facility] REGION) [address]  [place] [address]  [phone]         ______________________________________________________________________  Discharge Day Services:  BP 140/71  - Pulse 109  - Temp 37.1 ??C (Oral)  - Resp 18  - Ht 165.1 cm (5' 5)  - Wt 53.9 kg (118 lb 13.3 oz)  - SpO2 97%  - BMI 19.77 kg/m??   Pt seen on the day of discharge and determined appropriate for discharge.    Condition at Discharge: stable    Length of Discharge: I spent greater than 30 mins in the discharge of this patient.

## 2018-04-30 ENCOUNTER — Inpatient Hospital Stay: Payer: Medicare Other

## 2018-04-30 ENCOUNTER — Other Ambulatory Visit: Payer: Self-pay | Admitting: Hematology and Oncology

## 2018-04-30 ENCOUNTER — Inpatient Hospital Stay: Payer: Medicare Other | Admitting: Hematology and Oncology

## 2018-04-30 DIAGNOSIS — M1990 Unspecified osteoarthritis, unspecified site: Secondary | ICD-10-CM | POA: Diagnosis not present

## 2018-04-30 DIAGNOSIS — E785 Hyperlipidemia, unspecified: Secondary | ICD-10-CM | POA: Diagnosis not present

## 2018-04-30 DIAGNOSIS — I10 Essential (primary) hypertension: Secondary | ICD-10-CM | POA: Diagnosis not present

## 2018-04-30 DIAGNOSIS — M6281 Muscle weakness (generalized): Secondary | ICD-10-CM | POA: Diagnosis not present

## 2018-04-30 DIAGNOSIS — K219 Gastro-esophageal reflux disease without esophagitis: Secondary | ICD-10-CM | POA: Diagnosis not present

## 2018-04-30 DIAGNOSIS — R5381 Other malaise: Secondary | ICD-10-CM | POA: Diagnosis not present

## 2018-04-30 DIAGNOSIS — Z79899 Other long term (current) drug therapy: Secondary | ICD-10-CM | POA: Diagnosis not present

## 2018-04-30 DIAGNOSIS — N951 Menopausal and female climacteric states: Secondary | ICD-10-CM | POA: Diagnosis not present

## 2018-04-30 DIAGNOSIS — C9200 Acute myeloblastic leukemia, not having achieved remission: Secondary | ICD-10-CM | POA: Diagnosis not present

## 2018-04-30 DIAGNOSIS — D696 Thrombocytopenia, unspecified: Secondary | ICD-10-CM | POA: Diagnosis not present

## 2018-04-30 DIAGNOSIS — D509 Iron deficiency anemia, unspecified: Secondary | ICD-10-CM | POA: Diagnosis not present

## 2018-04-30 DIAGNOSIS — E1122 Type 2 diabetes mellitus with diabetic chronic kidney disease: Secondary | ICD-10-CM | POA: Diagnosis not present

## 2018-04-30 DIAGNOSIS — R279 Unspecified lack of coordination: Secondary | ICD-10-CM | POA: Diagnosis not present

## 2018-04-30 DIAGNOSIS — D701 Agranulocytosis secondary to cancer chemotherapy: Secondary | ICD-10-CM | POA: Insufficient documentation

## 2018-04-30 DIAGNOSIS — D61818 Other pancytopenia: Secondary | ICD-10-CM | POA: Diagnosis not present

## 2018-04-30 DIAGNOSIS — D649 Anemia, unspecified: Secondary | ICD-10-CM | POA: Diagnosis not present

## 2018-04-30 DIAGNOSIS — Z7984 Long term (current) use of oral hypoglycemic drugs: Secondary | ICD-10-CM | POA: Diagnosis not present

## 2018-04-30 DIAGNOSIS — J45909 Unspecified asthma, uncomplicated: Secondary | ICD-10-CM | POA: Diagnosis not present

## 2018-04-30 DIAGNOSIS — I129 Hypertensive chronic kidney disease with stage 1 through stage 4 chronic kidney disease, or unspecified chronic kidney disease: Secondary | ICD-10-CM | POA: Diagnosis not present

## 2018-04-30 DIAGNOSIS — J449 Chronic obstructive pulmonary disease, unspecified: Secondary | ICD-10-CM | POA: Diagnosis not present

## 2018-04-30 DIAGNOSIS — N189 Chronic kidney disease, unspecified: Secondary | ICD-10-CM | POA: Diagnosis not present

## 2018-04-30 DIAGNOSIS — E119 Type 2 diabetes mellitus without complications: Secondary | ICD-10-CM | POA: Diagnosis not present

## 2018-04-30 DIAGNOSIS — R262 Difficulty in walking, not elsewhere classified: Secondary | ICD-10-CM | POA: Diagnosis not present

## 2018-04-30 DIAGNOSIS — H409 Unspecified glaucoma: Secondary | ICD-10-CM | POA: Diagnosis not present

## 2018-04-30 DIAGNOSIS — H4010X0 Unspecified open-angle glaucoma, stage unspecified: Secondary | ICD-10-CM | POA: Diagnosis not present

## 2018-04-30 DIAGNOSIS — K625 Hemorrhage of anus and rectum: Secondary | ICD-10-CM | POA: Diagnosis not present

## 2018-04-30 DIAGNOSIS — R52 Pain, unspecified: Secondary | ICD-10-CM | POA: Diagnosis not present

## 2018-04-30 DIAGNOSIS — T451X5A Adverse effect of antineoplastic and immunosuppressive drugs, initial encounter: Secondary | ICD-10-CM

## 2018-04-30 DIAGNOSIS — C9240 Acute promyelocytic leukemia, not having achieved remission: Secondary | ICD-10-CM | POA: Diagnosis not present

## 2018-04-30 LAB — BASIC METABOLIC PANEL
ANION GAP: 6 mmol/L — ABNORMAL LOW (ref 9–15)
BLOOD UREA NITROGEN: 16 mg/dL (ref 7–21)
BUN / CREAT RATIO: 36
CALCIUM: 8.9 mg/dL (ref 8.5–10.2)
CHLORIDE: 101 mmol/L (ref 98–107)
CO2: 26 mmol/L (ref 22.0–30.0)
CREATININE: 0.45 mg/dL — ABNORMAL LOW (ref 0.60–1.00)
EGFR CKD-EPI AA FEMALE: >90 mL/min/{1.73_m2} (ref >=60)
GLUCOSE RANDOM: 110 mg/dL (ref 65–179)
POTASSIUM: 3.4 mmol/L — ABNORMAL LOW (ref 3.5–5.0)
SODIUM: 133 mmol/L — ABNORMAL LOW (ref 135–145)

## 2018-04-30 LAB — HEMATOCRIT: Lab: 28.3 — ABNORMAL LOW

## 2018-04-30 LAB — CBC W/ AUTO DIFF
HEMATOCRIT: 28.3 % — ABNORMAL LOW (ref 36.0–46.0)
HEMOGLOBIN: 9 g/dL — ABNORMAL LOW (ref 12.0–16.0)
MEAN CORPUSCULAR HEMOGLOBIN CONC: 31.8 g/dL (ref 31.0–37.0)
MEAN CORPUSCULAR HEMOGLOBIN: 29.6 pg (ref 26.0–34.0)
MEAN PLATELET VOLUME: 10.9 fL — ABNORMAL HIGH (ref 7.0–10.0)
PLATELET COUNT: 50 10*9/L — ABNORMAL LOW (ref 150–440)
RED BLOOD CELL COUNT: 3.04 10*12/L — ABNORMAL LOW (ref 4.00–5.20)
RED CELL DISTRIBUTION WIDTH: 23.5 % — ABNORMAL HIGH (ref 12.0–15.0)
WBC ADJUSTED: 1 10*9/L — ABNORMAL LOW (ref 4.5–11.0)

## 2018-04-30 LAB — D-DIMER QUANTITATIVE (CH,ML,PD,ET)
Lab: 2103 — ABNORMAL HIGH
Lab: 2200 — ABNORMAL HIGH

## 2018-04-30 LAB — MANUAL DIFFERENTIAL
BASOPHILS - ABS (DIFF): 0 10*9/L (ref 0.0–0.1)
EOSINOPHILS - ABS (DIFF): 0.1 10*9/L (ref 0.0–0.4)
EOSINOPHILS - REL (DIFF): 8 %
LYMPHOCYTES - ABS (DIFF): 0.5 10*9/L — ABNORMAL LOW (ref 1.5–5.0)
LYMPHOCYTES - REL (DIFF): 54 %
MONOCYTES - ABS (DIFF): 0 10*9/L — ABNORMAL LOW (ref 0.2–0.8)
MONOCYTES - REL (DIFF): 0 %
NEUTROPHILS - ABS (DIFF): 0 10*9/L — CL (ref 2.0–7.5)
NEUTROPHILS - REL (DIFF): 2 %

## 2018-04-30 LAB — APTT
APTT: 35.1 s (ref 25.9–39.5)
Coagulation surface induced:Time:Pt:PPP:Qn:Coag: 35.1

## 2018-04-30 LAB — FIBRINOGEN LEVEL: Lab: 420 — ABNORMAL HIGH

## 2018-04-30 LAB — PHOSPHORUS: Phosphate:MCnc:Pt:Ser/Plas:Qn:: 3

## 2018-04-30 LAB — CHLORIDE: Chloride:SCnc:Pt:Ser/Plas:Qn:: 101

## 2018-04-30 LAB — LACTATE DEHYDROGENASE
LACTATE DEHYDROGENASE: 1051 U/L — ABNORMAL HIGH (ref 338–610)
Lactate dehydrogenase:CCnc:Pt:Ser/Plas:Qn:: 1051 — ABNORMAL HIGH

## 2018-04-30 LAB — PROTIME-INR: PROTIME: 12.7 s (ref 10.2–12.8)

## 2018-04-30 LAB — MAGNESIUM: Magnesium:MCnc:Pt:Ser/Plas:Qn:: 1.6

## 2018-04-30 LAB — BASOPHILS - ABS (DIFF): Lab: 0

## 2018-04-30 LAB — INR: Lab: 1.11

## 2018-04-30 LAB — URIC ACID: Urate:MCnc:Pt:Ser/Plas:Qn:: 1.8 — ABNORMAL LOW

## 2018-04-30 NOTE — Unmapped (Signed)
Returned call to Dr. Tamela Oddi but got her voicemail.  Let her know that we have not yet seen patient since discharge and that BMBx is scheduled for 05/18/18 with clinic follow up with Dr. Lonni Fix on 9/17.  I advised her that Dr. Lonni Fix is out of town until next Tuesday but that I would pass on the message to have her call on her return.  Also gave her my pager and cell phone to call back with further questions.

## 2018-04-30 NOTE — Unmapped (Signed)
Hi Dr. Lonni Fix,    Dr. Tamela Oddi has called requesting to speak with you directly regarding the following:    Wanted to follow up on plan of care. Wants to see if there have been changes since patient was discharged.    Dr. Tamela Oddi is available at [phone].    A page has also been sent.    Thank you,  Kelli Hope  [facility] Communication Center  [phone]

## 2018-04-30 NOTE — Unmapped (Addendum)
VSS. Afebrile. Denies pain. Bed alarm remains activated, pt remains free from falls this shift. Indwelling catheter remains in place per order; clean, dry and working well at this time. PIV discontinued per order. Spoke with case manager, who stated that pt is approved to be discharged to [facility] in [place], and transport arranged for 1400. Discharge orders placed. Discharge paperwork complete and report called to Selena Batten, Charity fundraiser at [employer]. Pt updated. Awaiting transport at this time.     Problem: Adult Inpatient Plan of Care  Goal: Plan of Care Review  Outcome: Progressing  Goal: Patient-Specific Goal (Individualization)  Outcome: Progressing  Goal: Absence of Hospital-Acquired Illness or Injury  Outcome: Progressing  Goal: Optimal Comfort and Wellbeing  Outcome: Progressing  Goal: Readiness for Transition of Care  Outcome: Progressing  Goal: Rounds/Family Conference  Outcome: Progressing     Problem: Fall Injury Risk  Goal: Absence of Fall and Fall-Related Injury  Outcome: Progressing     Problem: Self-Care Deficit  Goal: Improved Ability to Complete Activities of Daily Living  Outcome: Progressing     Problem: Diabetes Comorbidity  Goal: Blood Glucose Level Within Desired Range  Outcome: Progressing     Problem: Hypertension Comorbidity  Goal: Blood Pressure in Desired Range  Outcome: Progressing     Problem: Coping Ineffective (Oncology Care)  Goal: Effective Coping  Outcome: Progressing     Problem: Fatigue (Oncology Care)  Goal: Improved Activity Tolerance  Outcome: Progressing

## 2018-04-30 NOTE — Unmapped (Signed)
VSS, uneventful PM shift thus far. Pt able to rest overnight with minimum interruption. Pt awaiting insurance approval for SNF d/c.     Skin: redness noted around 8/22 SubQ chemo injection site. WCTM and provide intervention as appropriate.
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
## 2018-05-02 DIAGNOSIS — E785 Hyperlipidemia, unspecified: Secondary | ICD-10-CM | POA: Diagnosis not present

## 2018-05-02 DIAGNOSIS — C9200 Acute myeloblastic leukemia, not having achieved remission: Secondary | ICD-10-CM | POA: Diagnosis not present

## 2018-05-02 DIAGNOSIS — I10 Essential (primary) hypertension: Secondary | ICD-10-CM | POA: Diagnosis not present

## 2018-05-02 DIAGNOSIS — D696 Thrombocytopenia, unspecified: Secondary | ICD-10-CM | POA: Diagnosis not present

## 2018-05-02 DIAGNOSIS — E119 Type 2 diabetes mellitus without complications: Secondary | ICD-10-CM | POA: Diagnosis not present

## 2018-05-03 ENCOUNTER — Inpatient Hospital Stay: Payer: Medicare Other

## 2018-05-03 ENCOUNTER — Inpatient Hospital Stay: Payer: Medicare Other | Attending: Hematology and Oncology

## 2018-05-03 DIAGNOSIS — K219 Gastro-esophageal reflux disease without esophagitis: Secondary | ICD-10-CM | POA: Insufficient documentation

## 2018-05-03 DIAGNOSIS — N189 Chronic kidney disease, unspecified: Secondary | ICD-10-CM | POA: Insufficient documentation

## 2018-05-03 DIAGNOSIS — K625 Hemorrhage of anus and rectum: Secondary | ICD-10-CM | POA: Insufficient documentation

## 2018-05-03 DIAGNOSIS — J45909 Unspecified asthma, uncomplicated: Secondary | ICD-10-CM | POA: Insufficient documentation

## 2018-05-03 DIAGNOSIS — D649 Anemia, unspecified: Secondary | ICD-10-CM | POA: Insufficient documentation

## 2018-05-03 DIAGNOSIS — E1122 Type 2 diabetes mellitus with diabetic chronic kidney disease: Secondary | ICD-10-CM | POA: Insufficient documentation

## 2018-05-03 DIAGNOSIS — H409 Unspecified glaucoma: Secondary | ICD-10-CM | POA: Insufficient documentation

## 2018-05-03 DIAGNOSIS — Z7984 Long term (current) use of oral hypoglycemic drugs: Secondary | ICD-10-CM | POA: Insufficient documentation

## 2018-05-03 DIAGNOSIS — C9200 Acute myeloblastic leukemia, not having achieved remission: Secondary | ICD-10-CM | POA: Insufficient documentation

## 2018-05-03 DIAGNOSIS — M1990 Unspecified osteoarthritis, unspecified site: Secondary | ICD-10-CM | POA: Insufficient documentation

## 2018-05-03 DIAGNOSIS — Z79899 Other long term (current) drug therapy: Secondary | ICD-10-CM | POA: Insufficient documentation

## 2018-05-03 DIAGNOSIS — E785 Hyperlipidemia, unspecified: Secondary | ICD-10-CM | POA: Insufficient documentation

## 2018-05-03 DIAGNOSIS — I129 Hypertensive chronic kidney disease with stage 1 through stage 4 chronic kidney disease, or unspecified chronic kidney disease: Secondary | ICD-10-CM | POA: Insufficient documentation

## 2018-05-03 DIAGNOSIS — D696 Thrombocytopenia, unspecified: Secondary | ICD-10-CM | POA: Insufficient documentation

## 2018-05-03 NOTE — Unmapped (Signed)
[facility] Triage Note     Patient: Tracey Lang     Reason for call: Foley cath status     Time call returned: 12:10     Phone Assessment: Spoke with patient, she is [facility] Patient requesting that her foley cath be removed.     Triage Recommendations: Upon further evaluation, patient was seen by Dr. Lonni Fix inpatient and has a scheduled appointment for 05/25/18. Patient was instructed to speak with the charge nurse about removing her foley cath. During our conversation patient gave her phone to the charge nurse of the facility and I requested that she speak to the patient about the status of her foley cath removal, charge nurse agreed.     Patient Response: Patient verbalized understanding that I spoke with charge nurse about status of removal of foley cath and that the attending physician at the rehab facility would need to write the order for removal when appropriate.     Outstanding tasks: Notification

## 2018-05-03 NOTE — Unmapped (Signed)
[facility] Child psychotherapist attempted to contact the Child psychotherapist assigned to pt at [facility]. [phone]. LCSW left a voice message for Mr. Claretha Cooper and asked him to writer back.

## 2018-05-03 NOTE — Unmapped (Signed)
Hi,     Tracey Lang contacted the [facility] regarding the following:    - They were told that catheter would be removed in 3 days but the rehab center doesn't have any orders.    Please contact at [phone].    Thanks in advance,    [name]  [facility] Cancer Communication Center   [phone]

## 2018-05-04 MED ORDER — POSACONAZOLE 100 MG TABLET,DELAYED RELEASE
ORAL_TABLET | Freq: Every day | ORAL | 0 refills | 0.00000 days | Status: SS
Start: 2018-05-04 — End: 2018-06-01

## 2018-05-04 MED ORDER — POSACONAZOLE 100 MG TABLET,DELAYED RELEASE: tablet | 0 refills | 0 days | Status: SS
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
## 2018-05-04 NOTE — Unmapped (Signed)
Hi Dr. Lonni Fix,    Dr. Mahalia Longest has called requesting to speak with you directly regarding the following:    The patients plan of care    Dr. Mel Almond is available for a call back at anytime,t he best number to call back is [phone]    A page has also been sent.    Thank you,  Tylene Fantasia  [facility] Cancer Communication Center  [phone]

## 2018-05-05 ENCOUNTER — Inpatient Hospital Stay: Payer: Medicare Other

## 2018-05-06 ENCOUNTER — Encounter: Payer: Self-pay | Admitting: Family Medicine

## 2018-05-06 DIAGNOSIS — E785 Hyperlipidemia, unspecified: Secondary | ICD-10-CM | POA: Diagnosis not present

## 2018-05-06 DIAGNOSIS — C9200 Acute myeloblastic leukemia, not having achieved remission: Secondary | ICD-10-CM | POA: Diagnosis not present

## 2018-05-06 DIAGNOSIS — E119 Type 2 diabetes mellitus without complications: Secondary | ICD-10-CM | POA: Diagnosis not present

## 2018-05-06 DIAGNOSIS — J449 Chronic obstructive pulmonary disease, unspecified: Secondary | ICD-10-CM | POA: Diagnosis not present

## 2018-05-07 ENCOUNTER — Other Ambulatory Visit: Payer: Self-pay | Admitting: Hematology and Oncology

## 2018-05-07 ENCOUNTER — Inpatient Hospital Stay: Payer: Medicare Other

## 2018-05-07 ENCOUNTER — Inpatient Hospital Stay (HOSPITAL_BASED_OUTPATIENT_CLINIC_OR_DEPARTMENT_OTHER): Payer: Medicare Other | Admitting: Hematology and Oncology

## 2018-05-07 ENCOUNTER — Other Ambulatory Visit: Payer: Self-pay | Admitting: *Deleted

## 2018-05-07 ENCOUNTER — Telehealth: Payer: Self-pay | Admitting: *Deleted

## 2018-05-07 VITALS — BP 123/71 | HR 109 | Temp 97.0°F | Resp 18

## 2018-05-07 DIAGNOSIS — Z79899 Other long term (current) drug therapy: Secondary | ICD-10-CM

## 2018-05-07 DIAGNOSIS — Z7984 Long term (current) use of oral hypoglycemic drugs: Secondary | ICD-10-CM | POA: Diagnosis not present

## 2018-05-07 DIAGNOSIS — D649 Anemia, unspecified: Secondary | ICD-10-CM | POA: Diagnosis not present

## 2018-05-07 DIAGNOSIS — K625 Hemorrhage of anus and rectum: Secondary | ICD-10-CM | POA: Diagnosis not present

## 2018-05-07 DIAGNOSIS — M1990 Unspecified osteoarthritis, unspecified site: Secondary | ICD-10-CM | POA: Diagnosis not present

## 2018-05-07 DIAGNOSIS — I129 Hypertensive chronic kidney disease with stage 1 through stage 4 chronic kidney disease, or unspecified chronic kidney disease: Secondary | ICD-10-CM | POA: Diagnosis not present

## 2018-05-07 DIAGNOSIS — J45909 Unspecified asthma, uncomplicated: Secondary | ICD-10-CM | POA: Diagnosis not present

## 2018-05-07 DIAGNOSIS — E1122 Type 2 diabetes mellitus with diabetic chronic kidney disease: Secondary | ICD-10-CM | POA: Diagnosis not present

## 2018-05-07 DIAGNOSIS — C9200 Acute myeloblastic leukemia, not having achieved remission: Secondary | ICD-10-CM | POA: Diagnosis not present

## 2018-05-07 DIAGNOSIS — H409 Unspecified glaucoma: Secondary | ICD-10-CM | POA: Diagnosis not present

## 2018-05-07 DIAGNOSIS — E785 Hyperlipidemia, unspecified: Secondary | ICD-10-CM | POA: Diagnosis not present

## 2018-05-07 DIAGNOSIS — D696 Thrombocytopenia, unspecified: Secondary | ICD-10-CM

## 2018-05-07 DIAGNOSIS — K219 Gastro-esophageal reflux disease without esophagitis: Secondary | ICD-10-CM | POA: Diagnosis not present

## 2018-05-07 DIAGNOSIS — N189 Chronic kidney disease, unspecified: Secondary | ICD-10-CM | POA: Diagnosis not present

## 2018-05-07 DIAGNOSIS — D708 Other neutropenia: Secondary | ICD-10-CM

## 2018-05-07 LAB — COMPREHENSIVE METABOLIC PANEL
ALT: 15 U/L (ref 0–44)
AST: 32 U/L (ref 15–41)
Albumin: 3.4 g/dL — ABNORMAL LOW (ref 3.5–5.0)
Alkaline Phosphatase: 85 U/L (ref 38–126)
Anion gap: 9 (ref 5–15)
BUN: 11 mg/dL (ref 8–23)
CO2: 26 mmol/L (ref 22–32)
Calcium: 9.1 mg/dL (ref 8.9–10.3)
Chloride: 97 mmol/L — ABNORMAL LOW (ref 98–111)
Creatinine, Ser: 0.5 mg/dL (ref 0.44–1.00)
GFR calc Af Amer: >60 mL/min (ref >60)
GFR calc non Af Amer: >60 mL/min (ref >60)
Glucose, Bld: 223 mg/dL — ABNORMAL HIGH (ref 70–99)
Potassium: 3.8 mmol/L (ref 3.5–5.1)
Sodium: 132 mmol/L — ABNORMAL LOW (ref 135–145)
Total Bilirubin: 1.7 mg/dL — ABNORMAL HIGH (ref 0.3–1.2)
Total Protein: 7.7 g/dL (ref 6.5–8.1)

## 2018-05-07 LAB — CBC WITH DIFFERENTIAL/PLATELET
Basophils Absolute: 0 10*3/uL (ref 0–0.1)
Basophils Relative: 0 %
Blasts: 28 %
Eosinophils Absolute: 0.1 10*3/uL (ref 0–0.7)
Eosinophils Relative: 1 %
HCT: 21.8 % — ABNORMAL LOW (ref 35.0–47.0)
Hemoglobin: 7.4 g/dL — ABNORMAL LOW (ref 12.0–16.0)
Lymphocytes Relative: 89 %
Lymphs Abs: 5.4 10*3/uL — ABNORMAL HIGH (ref 1.0–3.6)
MCH: 32 pg (ref 26.0–34.0)
MCHC: 33.7 g/dL (ref 32.0–36.0)
MCV: 94.9 fL (ref 80.0–100.0)
Monocytes Absolute: 0.1 10*3/uL — ABNORMAL LOW (ref 0.2–0.9)
Monocytes Relative: 2 %
Neutro Abs: 0.1 10*3/uL — ABNORMAL LOW (ref 1.4–6.5)
Neutrophils Relative %: 1 %
Platelets: 9 10*3/uL — CL (ref 150–400)
RBC: 2.3 MIL/uL — ABNORMAL LOW (ref 3.80–5.20)
RDW: 23.8 % — ABNORMAL HIGH (ref 11.5–14.5)
WBC: 6.1 10*3/uL (ref 4.0–10.5)
nRBC: 29 /100 WBC — ABNORMAL HIGH

## 2018-05-07 LAB — PREPARE RBC (CROSSMATCH)

## 2018-05-07 LAB — MAGNESIUM: Magnesium: 1.5 mg/dL — ABNORMAL LOW (ref 1.7–2.4)

## 2018-05-07 LAB — URIC ACID: Uric Acid, Serum: 4 mg/dL (ref 2.5–7.1)

## 2018-05-07 LAB — PATHOLOGIST SMEAR REVIEW

## 2018-05-07 MED ORDER — DIPHENHYDRAMINE HCL 25 MG PO CAPS
25.0000 mg | ORAL_CAPSULE | Freq: Once | ORAL | Status: AC
  Administered 2018-05-07: 25 mg via ORAL
  Filled 2018-05-07: qty 1

## 2018-05-07 MED ORDER — ACETAMINOPHEN 325 MG PO TABS
650.0000 mg | ORAL_TABLET | Freq: Once | ORAL | Status: AC
  Administered 2018-05-07: 650 mg via ORAL
  Filled 2018-05-07: qty 2

## 2018-05-07 MED ORDER — SODIUM CHLORIDE 0.9% IV SOLUTION
250.0000 mL | Freq: Once | INTRAVENOUS | Status: AC
  Administered 2018-05-07: 250 mL via INTRAVENOUS
  Filled 2018-05-07: qty 250

## 2018-05-07 MED ORDER — SODIUM CHLORIDE 0.9 % IV SOLN
1.0000 g | Freq: Once | INTRAVENOUS | Status: AC
  Administered 2018-05-07: 1 g via INTRAVENOUS
  Filled 2018-05-07: qty 2

## 2018-05-07 MED ORDER — SODIUM CHLORIDE 0.9 % IV SOLN
Freq: Once | INTRAVENOUS | Status: AC
  Administered 2018-05-07: 11:00:00 via INTRAVENOUS
  Filled 2018-05-07: qty 250

## 2018-05-07 NOTE — Unmapped (Signed)
Hi,    Dr. Merlene Pulling has called requesting to speak with you directly regarding the following:    Plan of care and consult. The patient is currently with the provider.     Dr. Merlene Pulling is available for a call back between 10:00 & 11:30.  The best number to call back is [phone]    A page has also been sent.    Thank you,  Drema Balzarine  [facility] Cancer Communication Center  [phone]

## 2018-05-07 NOTE — Unmapped (Signed)
Requested by Dr. Salli Real to have oversight over scheduling lab with transfusion support here 05/10/2018 which has been scheduled. In addition, I spoke directly with Dr. Nelva Nay to communicate this to her for local management care since their office closed that day. She verbalized clear understanding.

## 2018-05-07 NOTE — Progress Notes (Signed)
START OFF PATHWAY REGIMEN - [Other Dx]   [id number]:Azacitidine 75 mg/m2 subcut D1-5, 8-9 q28 days:   A cycle is every 28 days:     Azacitidine   **Always confirm dose/schedule in your pharmacy ordering system**  Patient Characteristics: Intent of Therapy: Non-Curative / Palliative Intent, Discussed with Patient

## 2018-05-07 NOTE — Telephone Encounter (Signed)
Critical Labs - Platelets 9, ANC 0.0.  MD notified.

## 2018-05-07 NOTE — Progress Notes (Addendum)
[facility] day:  05/07/2018  Chief Complaint: Julie Holland is a 77 y.o. 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  HPI:  The patient was last seen in the hematology clinic on 01/26/2018.  At that time, she denied any fevers, sweats, or issues with infections.  Exam revealed no adenopathy or hepatosplenomegaly. WBC was 6200 ([place] 2900). Platelets were 128,000 (improved).  She was admitted to [facility] from 02/10/2018 - 02/13/2018 with MSSA sepsis in the setting of right wrist abscess after a fall and injury.  She underwent incision and drainage by Dr. Rudene Christians.  She was treated with IV cefazolin x 6 weeks.  She was admitted to [facility] from 02/18/2018 - 02/20/2018 with a GI bleed.  Routine labs revealed a hemoglobin of 5.5.  She received 2 units of PRBCs.  Discharge hemoglobin was 8.8.  EGD revealed gastric ulcers which were non-bleeding and gastritis.  She was discharged on Protonix BID.  She presented to the [facility] ER on 04/16/2018 with a syncopal episode.  CBC revealed a hematocrit of 20.3, hemoglobin 6.7, platelets 113,000, and WBC 19,200.  Peripheral smear revealed 20% blasts with a background of dysplasia in eosinophils.  She was transferred to [facility].  She was admitted to [facility] from 04/16/2018 - 04/30/2018.  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 Bone marrow biopsy on 04/19/2018 was consistent with AML.  Cytogenetics were normal (68, XX)  on first 10 metaphases.  MLL FISH revealed no MLL rearrangement. BEAT AML trial was offered and declined.    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    Chemotherapy consisting of azacytidine and venetoclax started on 08/16/20219 (completed 04/29/2018) after WBC count fell <10,000.   She had no evidence of tumor lysis.  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  She completed allopurinol.  She is transfused for Hgb <7, platelets <10,000 in light of recent GI bleed.  CBC on 04/29/2018 revealed a hematocrit of 27.7, hemoglobin 8.8, platelets 48,000, WBC 1300.  ANC was 200 on 04/29/2018.  [facility] to [facility] [place]. She is working with physical therapy on walking and activities of daily living.    During the interim, she has been weak and tired.  She has a chronic cough due to allergies.  She describes diarrhea x 2 days secondary to "something I ate".  She denies any nausea or vomiting.  She denies any fever.  She is to have labs drawn on a Monday, Wednesday, and Friday and transfused if needed.  She returns back to [facility] on 05/25/2018.   Past Medical History:  Diagnosis Date  . Arthritis   . Asthma   . 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  . Cataract   . Chronic kidney disease    stage 1  . Diabetes mellitus 2007   type II  . Dry eye of right side   . GERD (gastroesophageal reflux disease)    OCC  . Glaucoma 2018   RIGHT EYE   . Hemorrhoids   . Hyperlipidemia   . Hypertension  Past Surgical History:  Procedure Laterality Date  . CATARACT EXTRACTION, BILATERAL    . CHOLECYSTECTOMY    . COLONOSCOPY  02/13/13   Dr Bary Castilla  . EPIGASTRIC HERNIA REPAIR N/A 03/20/2015   Procedure: HERNIA REPAIR EPIGASTRIC ADULT;  Surgeon: Robert Bellow, MD;  Location: [facility] ORS;  Service: General;  Laterality: N/A;  . ESOPHAGOGASTRODUODENOSCOPY N/A 02/19/2018   Procedure: ESOPHAGOGASTRODUODENOSCOPY (EGD);  Surgeon: Lin Landsman, MD;  Location: [facility] ENDOSCOPY;  Service: Gastroenterology;  Laterality: N/A;  .  HEMORRHOID SURGERY N/A 03/20/2015    FOCAL HIGH-GRADE SQUAMOUS INTRAEPITHELIAL LESION (HSIL, ANAL /HEMORRHOIDECTOMY;   Robert Bellow, MD [facility] ORS;  : General;  Laterality: N/A;  . HERNIA REPAIR  July 2016   Epigastric hernia, primary repair  . INCISION AND DRAINAGE Right 02/11/2018   Procedure: INCISION AND DRAINAGE- RIGHT HAND;  Surgeon: Hessie Knows, MD;  Location: [facility] ORS;  Service: Orthopedics;  Laterality: Right;  . TONSILLECTOMY  age 67  . TOTAL ABDOMINAL HYSTERECTOMY  01/1989  . TUBAL LIGATION    . 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  Case has been presented at the [facility] tumor board. No indication for additional treatment outside of serial exams  Surgeon: Robert Bellow, MD;  Location: [facility] ORS;  Service: General;  Laterality: N/A;    Family History  Problem Relation Age of Onset  . Bladder Cancer Mother   . Colon cancer Father   . Lung cancer Brother   . Lymphoma Sister   . Breast cancer Neg Hx     Social History:  reports that she has never smoked. She has never used smokeless tobacco. She reports that she does not drink alcohol or use drugs.  She lives in [place] with her husband.  She denies any exposure to radiations or toxins.  Her husband's name is [name].  She is living at [facility] in [place] (rehabilitation).  The patient is accompanied by her husband today.  Allergies:  Allergies  Allergen Reactions  . Hydrocodone Nausea And Vomiting and Other (See Comments)    Vertigo but patient takes hydrocodone/acetaminophen outpatient    Current Medications: Current Outpatient Medications  Medication Sig Dispense Refill  . ACCU-CHEK SMARTVIEW test strip USE AS INSTRUCTED TO CHECK  BLOOD GLUCOSE ONCE DAILY if desired; LON 99 months; Dx E11.9 100 each 1  . acetaminophen (TYLENOL) 500 MG tablet Take 500-1,000 mg by mouth every 6 (six) hours as needed (for pain).    . ADVAIR DISKUS  100-50 MCG/DOSE AEPB USE 1 PUFF TWO TIMES DAILY (Patient taking differently: daily) 3 each 2  . albuterol (PROVENTIL HFA;VENTOLIN HFA) 108 (90 Base) MCG/ACT inhaler Inhale 2 puffs into the lungs every 6 (six) hours as needed for wheezing or shortness of breath. 1 Inhaler 2  . Carboxymeth-Glycerin-Polysorb (REFRESH OPTIVE ADVANCED OP) Place 1 drop into both eyes 3 (three) times daily as needed (for dry/irritated eyes).    . carvedilol (COREG) 6.25 MG tablet Take 1 tablet (6.25 mg total) by mouth 2 (two) times daily with a meal. 180 tablet 1  . ferrous sulfate 325 (65 FE) MG tablet Take 1 tablet (325 mg total) by mouth daily.    Marland Kitchen latanoprost (XALATAN) 0.005 % ophthalmic solution Place 1 drop at bedtime into the right eye.    Marland Kitchen lisinopril (PRINIVIL,ZESTRIL) 10 MG tablet TAKE 1 TABLET BY MOUTH  DAILY 90 tablet 1  . metFORMIN (GLUCOPHAGE) 850 MG tablet Take  1 tablet (850 mg total) by mouth 2 (two) times daily with a meal. 180 tablet 0  . Multiple Vitamin (MULTIVITAMIN) tablet Take 2 tablets by mouth daily.     . Nutritional Supplements (ESTROVEN PO) Take 1 tablet by mouth daily.    . pantoprazole (PROTONIX) 40 MG tablet Take 1 tablet (40 mg total) by mouth 2 (two) times daily before a meal. 180 tablet 0  . simvastatin (ZOCOR) 20 MG tablet Take 1 tablet (20 mg total) by mouth at bedtime. 90 tablet 0  . levofloxacin (LEVAQUIN) 500 MG tablet Take 500 mg by mouth.    . posaconazole (NOXAFIL) 100 MG TBEC delayed-release tablet Take 300 mg by mouth daily.     No current facility-administered medications for this visit.    Facility-Administered Medications Ordered in Other Visits  Medication Dose Route Frequency Provider Last Rate Last Dose  . sodium chloride flush (NS) 0.9 % injection 10 mL  10 mL Intracatheter PRN Corcoran, Melissa C, MD      . sodium chloride flush (NS) 0.9 % injection 3 mL  3 mL Intracatheter PRN Lequita Asal, MD        Review of Systems  Constitutional: Positive for  malaise/fatigue. Negative for chills, diaphoresis, fever and weight loss (unknown).       Feels weak and tired.  HENT: Negative.  Negative for congestion, ear discharge, ear pain, nosebleeds, sinus pain, sore throat and tinnitus.   Eyes: Negative.  Negative for blurred vision, double vision, pain, discharge and redness.  Respiratory: Negative for cough (chronic due to allergies), hemoptysis, sputum production and shortness of breath.   Cardiovascular: Positive for leg swelling. Negative for chest pain, palpitations, orthopnea and PND.  Gastrointestinal: Positive for diarrhea. Negative for abdominal pain, blood in stool, constipation, heartburn, melena, nausea and vomiting.  Genitourinary: Negative.  Negative for dysuria, hematuria and urgency.  Musculoskeletal: Negative for back pain, joint pain and myalgias.  Skin: Negative.  Negative for itching and rash.  Neurological: Positive for weakness (general). Negative for dizziness, tingling, tremors, sensory change, speech change, focal weakness, seizures and headaches.  Endo/Heme/Allergies: Bruises/bleeds easily.       Diabetes  Psychiatric/Behavioral: Negative for depression, memory loss and suicidal ideas. The patient is not nervous/anxious and does not have insomnia.   All other systems reviewed and are negative.  Physical Exam: Blood pressure 123/71, pulse (!) 109, temperature (!) 97 F (36.1 C), temperature source Tympanic, resp. rate 18. GENERAL:  Thin elderly woman sitting comfortably in a wheelchair in the exam room in no acute distress. MENTAL STATUS:  Alert and oriented to person, place and time. HEAD:  Short gray hair.  Normocephalic, atraumatic, face symmetric, no Cushingoid features. EYES:  Brown eyes.  Pupils equal round and reactive to light and accomodation.  No conjunctivitis or scleral icterus. ENT:  Oropharynx clear without lesion.  Tongue normal. Mucous membranes moist.  RESPIRATORY:  Clear to auscultation without rales,  wheezes or rhonchi. CARDIOVASCULAR:  Regular rate and rhythm without murmur, rub or gallop. ABDOMEN:  Soft, non-tender, with active bowel sounds, and no hepatosplenomegaly.  No masses. SKIN:  Marked ecchymosis.  No rashes, ulcers or lesions. EXTREMITIES: Bilateral lower extremity edema (left > right).  No palpable cords. LYMPH NODES: No palpable cervical, supraclavicular, axillary or inguinal adenopathy  NEUROLOGICAL: Unremarkable. PSYCH:  Appropriate.    Orders Only on 05/07/2018  Component Date Value Ref Range Status  . Unit Number 05/07/2018 [id number]   Final  . Blood Component Type 05/07/2018 PLTPH  LI3 PAS   Final  . Unit division 05/07/2018 00   Final  . Status of Unit 05/07/2018 ISSUED,FINAL   Final  . Transfusion Status 05/07/2018    Final                   Value:OK TO TRANSFUSE Performed at [facility], [address] [place], [place] [address]   . Order Confirmation 05/07/2018    Final                   Value:ORDER PROCESSED BY BLOOD BANK Performed at [facility], [address] [place], [place] [address]   . ABO/RH(D) 05/07/2018 A POS   Final  . Antibody Screen 05/07/2018 NEG   Final  . Sample Expiration 05/07/2018 05/10/2018   Final  . Unit Number 05/07/2018 [id number]   Final  . Blood Component Type 05/07/2018 RBC, LR IRR   Final  . Unit division 05/07/2018 00   Final  . Status of Unit 05/07/2018 ISSUED,FINAL   Final  . Transfusion Status 05/07/2018 OK TO TRANSFUSE   Final  . Crossmatch Result 05/07/2018    Final                   Value:Compatible Performed at [facility], [address] [place], [place] [address]   . Unit Number 05/07/2018 [id number]   Final  . Blood Component Type 05/07/2018 RBC, LR IRR   Final  . Unit division 05/07/2018 00   Final  . Status of Unit 05/07/2018 ISSUED,FINAL   Final  . Transfusion Status 05/07/2018 OK TO TRANSFUSE   Final  . Crossmatch Result 05/07/2018 Compatible   Final  . ISSUE  DATE / TIME 05/07/2018 [id number]   Final  . Blood Product Unit Number 05/07/2018 [id number]   Final  . PRODUCT CODE 05/07/2018 [id number]   Final  . Unit Type and Rh 05/07/2018 0600   Final  . Blood Product Expiration Date 05/07/2018 [id number]   Final  . ISSUE DATE / TIME 05/07/2018 [id number]   Final  . Blood Product Unit Number 05/07/2018 [id number]   Final  . PRODUCT CODE 05/07/2018 [id number]   Final  . Unit Type and Rh 05/07/2018 6200   Final  . Blood Product Expiration Date 05/07/2018 [id number]   Final  . ISSUE DATE / TIME 05/07/2018 [id number]   Final  . Blood Product Unit Number 05/07/2018 [id number]   Final  . PRODUCT CODE 05/07/2018 [id number]   Final  . Unit Type and Rh 05/07/2018 6200   Final  . Blood Product Expiration Date 05/07/2018 [id number]   Final  Appointment on 05/07/2018  Component Date Value Ref Range Status  . WBC 05/07/2018 6.1  4.0 - 10.5 K/uL Final   WHITE COUNT CONFIRMED ON SMEAR  . RBC 05/07/2018 2.30* 3.80 - 5.20 MIL/uL Final  . Hemoglobin 05/07/2018 7.4* 12.0 - 16.0 g/dL Final  . HCT 05/07/2018 21.8* 35.0 - 47.0 % Final  . MCV 05/07/2018 94.9  80.0 - 100.0 fL Final  . MCH 05/07/2018 32.0  26.0 - 34.0 pg Final  . MCHC 05/07/2018 33.7  32.0 - 36.0 g/dL Final  . RDW 05/07/2018 23.8* 11.5 - 14.5 % Final  . Platelets 05/07/2018 9* 150 - 400 K/uL Final   Comment: RESULT REPEATED AND VERIFIED PLATELET COUNT CONFIRMED BY SMEAR CRITICAL RESULT CALLED TO, READ BACK BY AND VERIFIED WITH: ANITA BLACK @ 9:35AM 05/07/2018 BY LGR   . Neutrophils Relative % 05/07/2018 1  %  Final  . Lymphocytes Relative 05/07/2018 89  % Final  . Monocytes Relative 05/07/2018 2  % Final  . Eosinophils Relative 05/07/2018 1  % Final  . Basophils Relative 05/07/2018 0  % Final  . Blasts 05/07/2018 28  % Corrected   Comment: RESULT CHANGE CALLED TO DR Mike Gip ON 05/07/2018 AT 16:10 BY DR RUBINAS. CORRECTED ON 08/30 AT 3710: PREVIOUSLY REPORTED AS 7   . nRBC  05/07/2018 29* 0 /100 WBC Final  . Neutro Abs 05/07/2018 0.1* 1.4 - 6.5 K/uL Final  . Lymphs Abs 05/07/2018 5.4* 1.0 - 3.6 K/uL Final  . Monocytes Absolute 05/07/2018 0.1* 0.2 - 0.9 K/uL Final  . Eosinophils Absolute 05/07/2018 0.1  0 - 0.7 K/uL Final  . Basophils Absolute 05/07/2018 0.0  0 - 0.1 K/uL Final  . RBC Morphology 05/07/2018 MIXED RBC POPULATION   Final  . WBC Morphology 05/07/2018 SMUDGE CELLS   Final  . Smear Review 05/07/2018 CONSISTANT WITH PREVIOUS PATHOLOGIST REVIEW WITH INCREASE IN NRBCS   Final   Comment: PATH REVIEW HAS BEEN ORDERED ON THIS ACCESSION NUMBER Performed at [facility], [address] [place], [place] [address]   . Sodium 05/07/2018 132* 135 - 145 mmol/L Final  . Potassium 05/07/2018 3.8  3.5 - 5.1 mmol/L Final  . Chloride 05/07/2018 97* 98 - 111 mmol/L Final  . CO2 05/07/2018 26  22 - 32 mmol/L Final  . Glucose, Bld 05/07/2018 223* 70 - 99 mg/dL Final  . BUN 05/07/2018 11  8 - 23 mg/dL Final  . Creatinine, Ser 05/07/2018 0.50  0.44 - 1.00 mg/dL Final  . Calcium 05/07/2018 9.1  8.9 - 10.3 mg/dL Final  . Total Protein 05/07/2018 7.7  6.5 - 8.1 g/dL Final  . Albumin 05/07/2018 3.4* 3.5 - 5.0 g/dL Final  . AST 05/07/2018 32  15 - 41 U/L Final  . ALT 05/07/2018 15  0 - 44 U/L Final  . Alkaline Phosphatase 05/07/2018 85  38 - 126 U/L Final  . Total Bilirubin 05/07/2018 1.7* 0.3 - 1.2 mg/dL Final  . GFR calc non Af Amer 05/07/2018 >60  >60 mL/min Final  . GFR calc Af Amer 05/07/2018 >60  >60 mL/min Final   Comment: (NOTE) The eGFR has been calculated using the CKD EPI equation. This calculation has not been validated in all clinical situations. eGFR's persistently <60 mL/min signify possible Chronic Kidney Disease.   Georgiann Hahn gap 05/07/2018 9  5 - 15 Final   Performed at [facility], [address] [place], [place] [address]  . Magnesium 05/07/2018 1.5* 1.7 - 2.4 mg/dL Final   Performed at [facility], [address],  [place], [place] [address]  . Blood Bank Specimen 05/07/2018 SAMPLE AVAILABLE FOR TESTING   Final  . Sample Expiration 05/07/2018    Final                   Value:05/10/2018 Performed at [facility], [address], [place], [place] [address]   . Uric Acid, Serum 05/07/2018 4.0  2.5 - 7.1 mg/dL Final   Performed at [facility], [address] [place], [place] [address]  . Path Review 05/07/2018 Peripheral blood smear is reviewed.   Final   Comment: Patient with recent diagnosis of AML, day 8 of cycle 1 of chemotherapy. 28% blasts. Rare neutrophil. Thrombocytopenia. Nucleated RBCs with dyserythropoiesis. Findings called to Dr. Mike Gip on 05/07/2018 at 4:10PM. Reviewed by Dellia Nims. Reuel Derby, M.D. 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  She presented with symptomatic anemia (syncope) and 20% circulating blasts.  Bone marrow biopsy on 04/19/2018 was consistent with AML.  Cytogenetics were normal (72, XX)  on first 10 metaphases.  MLL FISH revealed no MLL rearrangement. BEAT AML trial was offered and declined.    She is day 15 of cycle #1 azacytidine and venetoclax (08/16/20219 - 04/29/2018).  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   She has a history of rectal bleeding requiring 2 units of PRBCs on 07/31/2016.  She underwent oversewing of a bleeding arterial vessel at the site of previous mucosal resection.   She has taken oral iron since her surgery.  She was admitted to [facility] from 02/10/2018 - 02/13/2018 with MSSA sepsis in the setting of right wrist abscess after a fall and injury.  TEE was negative.  She underwent incision and drainage and was treated with IV cefazolin x 6 weeks.  MRI right wrist on 04/19/2018 at [facility] showed no evidence for soft tissue swelling or drainable fluid collection, suggesting pain was  secondary to TFCC tear/degeneration.  She was admitted to [facility] from 02/18/2018 - 02/20/2018 with a GI bleed.  She received 2 units of PRBCs.  EGD revealed gastric ulcers which were non-bleeding and gastritis.    Symptomatically, she is fatigued.  She denies any bleeding.  Exam reveals no adenopathy or hepatosplenomegaly.  Hemoglobin is 7.4.  Platelets are 9,000.  ANC is 100.    Plan: 1. Labs today:  CBC with diff, CMP, Mg, uric acid, hold tube.  2. Acute myelogenous leukemia (AML):  Discuss interim events and bone marrow.  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  Discuss plan for co-management of AML with [facility] (Dr Patrice Paradise [phone]; [phone])-contacted.  Discuss continuation of venetoclax.  Discuss labs on MWF with transfusion as needed (leukopoor and irradiated). 3. Anemia and thrombocytopenia:  Transfuse 1 unit platelets today.  Transfuse 2 units of PRBCs today.  Discuss upcoming holiday (Labor Day) on 05/10/2018 (Monday) with clinic and infusion center closed.  Coordinate transfusion support on 05/10/2018 with [facility]- called.  Patient will be seen for labs and +/- transfusion on 05/10/2018 at [facility].   4. Neutropenia:  Review neutropenic precautions. 5. Hypomagnesemia:  Avoid oral magnesium secondary to diarrhea.  Magnesium sulfate 1 gm IV.   6. RTC on Mondays, Wednesdays, and Fridays for labs (CBC with diff, hold tube), and +/- transfusion. 7. RTC on 05/14/2018 for MD assessment, labs (CBC with diff, CMP, Mg, hold tube), and +/- transfusion.   Lequita Asal, MD 05/07/2018, 5:35 PM

## 2018-05-07 NOTE — Progress Notes (Signed)
Patient here today for follow up.  Patient currently in [facility].  Patient last seen @ [facility].

## 2018-05-07 NOTE — Progress Notes (Signed)
Per Benjamine Mola RN per Dr. Mike Gip, pt to receive 2 units of blood, one unit of platelets, and 1 gram of Magnesium, okay to discharge pt after infusions.

## 2018-05-08 LAB — BPAM PLATELET PHERESIS
Blood Product Expiration Date: 201908312359
ISSUE DATE / TIME: 201908301641
Unit Type and Rh: 6200

## 2018-05-08 LAB — SAMPLE TO BLOOD BANK

## 2018-05-08 LAB — TYPE AND SCREEN
ABO/RH(D): A POS
Antibody Screen: NEGATIVE
Unit division: 0
Unit division: 0

## 2018-05-08 LAB — BPAM RBC
Blood Product Expiration Date: 201909022359
Blood Product Expiration Date: 201909092359
ISSUE DATE / TIME: 201908301253
ISSUE DATE / TIME: 201908301442
Unit Type and Rh: 600
Unit Type and Rh: 6200

## 2018-05-08 LAB — PREPARE PLATELET PHERESIS: Unit division: 0

## 2018-05-10 ENCOUNTER — Encounter: Admit: 2018-05-10 | Discharge: 2018-05-11 | Payer: MEDICARE

## 2018-05-10 DIAGNOSIS — C9200 Acute myeloblastic leukemia, not having achieved remission: Principal | ICD-10-CM

## 2018-05-10 LAB — MANUAL DIFFERENTIAL
BASOPHILS - ABS (DIFF): 0.1 10*9/L (ref 0.0–0.1)
BASOPHILS - REL (DIFF): 1 %
BLASTS - REL (DIFF): 57 % (ref <=0)
EOSINOPHILS - REL (DIFF): 3 %
LYMPHOCYTES - ABS (DIFF): 2.2 10*9/L (ref 1.5–5.0)
MONOCYTES - ABS (DIFF): 0.3 10*9/L (ref 0.2–0.8)
MONOCYTES - REL (DIFF): 5 %
NEUTROPHILS - ABS (DIFF): 0.1 10*9/L — CL (ref 2.0–7.5)
NEUTROPHILS - REL (DIFF): 1 %

## 2018-05-10 LAB — CBC W/ AUTO DIFF
HEMATOCRIT: 27.5 % — ABNORMAL LOW (ref 36.0–46.0)
MEAN CORPUSCULAR HEMOGLOBIN CONC: 32.6 g/dL (ref 31.0–37.0)
MEAN CORPUSCULAR HEMOGLOBIN: 30.8 pg (ref 26.0–34.0)
MEAN CORPUSCULAR VOLUME: 94.4 fL (ref 80.0–100.0)
MEAN PLATELET VOLUME: 7.9 fL (ref 7.0–10.0)
NUCLEATED RED BLOOD CELLS: 103 /100{WBCs} — ABNORMAL HIGH (ref <=4)
PLATELET COUNT: 15 10*9/L — ABNORMAL LOW (ref 150–440)
RED BLOOD CELL COUNT: 2.91 10*12/L — ABNORMAL LOW (ref 4.00–5.20)
RED CELL DISTRIBUTION WIDTH: 26 % — ABNORMAL HIGH (ref 12.0–15.0)
WBC ADJUSTED: 6.7 10*9/L (ref 4.5–11.0)

## 2018-05-10 LAB — EOSINOPHILS - REL (DIFF): Lab: 3

## 2018-05-10 LAB — NUCLEATED RED BLOOD CELLS: Lab: 103 — ABNORMAL HIGH

## 2018-05-10 NOTE — Unmapped (Signed)
If you feel like this is an emergency please call 911.  For appointments or questions Monday through Friday 8AM-5PM please call [phone] or Toll Free [phone]. For Medical questions or concerns ask for the Nurse Triage Line.  On Nights, Weekends, and Holidays call [phone] and ask for the Oncologist on Call.  Reasons to call the Nurse Triage Line:  Fever of 100.5 or greater  Nausea and/or vomiting not relived with nausea medicine  Diarrhea or constipation  Severe pain not relieved with usual pain regimen  Shortness of breath  Uncontrolled bleeding  Mental status changes    Hospital Outpatient Visit on 05/10/2018   Component Date Value Ref Range Status   ??? ABO Grouping 05/10/2018 A POS   Final   ??? Antibody Screen 05/10/2018 NEG   Final   ??? WBC 05/10/2018    Preliminary    Pending.   ??? RBC 05/10/2018 2.91* 4.00 - 5.20 10*12/L Preliminary   ??? HGB 05/10/2018 9.0* 12.0 - 16.0 g/dL Preliminary   ??? HCT 05/10/2018 27.5* 36.0 - 46.0 % Preliminary   ??? MCV 05/10/2018 94.4  80.0 - 100.0 fL Preliminary   ??? MCH 05/10/2018 30.8  26.0 - 34.0 pg Preliminary   ??? MCHC 05/10/2018 32.6  31.0 - 37.0 g/dL Preliminary   ??? RDW 05/10/2018 26.0* 12.0 - 15.0 % Preliminary   ??? MPV 05/10/2018 7.9  7.0 - 10.0 fL Preliminary   ??? Platelet 05/10/2018 15* 150 - 440 10*9/L Preliminary   ??? Variable HGB Concentration 05/10/2018 Moderate* Not Present Preliminary   ??? Macrocytosis 05/10/2018 Marked* Not Present Preliminary   ??? Anisocytosis 05/10/2018 Marked* Not Present Preliminary   ??? Hypochromasia 05/10/2018 Moderate* Not Present Preliminary

## 2018-05-10 NOTE — Unmapped (Signed)
Labs within parameters - denied any symptoms. PIV removed and pt left ambulatory and stable

## 2018-05-11 ENCOUNTER — Telehealth: Payer: Self-pay | Admitting: *Deleted

## 2018-05-11 NOTE — Telephone Encounter (Signed)
Patient currently hospitalized at [facility]. Her husband is calling to cancel upcoming appointment, also let Dr Johnnye Sima know that a new MRI of her hand showed no infection, arthritis only. They will call back if they need to reschedule an appointment. Landis Gandy, RN

## 2018-05-12 ENCOUNTER — Inpatient Hospital Stay: Payer: Medicare Other | Attending: Hematology and Oncology

## 2018-05-12 ENCOUNTER — Other Ambulatory Visit: Payer: Self-pay | Admitting: Hematology and Oncology

## 2018-05-12 ENCOUNTER — Ambulatory Visit
Admission: RE | Admit: 2018-05-12 | Discharge: 2018-05-12 | Disposition: A | Discharge status: Home / Self Care | Payer: Medicare Other | Source: Ambulatory Visit | Attending: Hematology and Oncology | Admitting: Hematology and Oncology

## 2018-05-12 DIAGNOSIS — K2970 Gastritis, unspecified, without bleeding: Secondary | ICD-10-CM | POA: Insufficient documentation

## 2018-05-12 DIAGNOSIS — E1122 Type 2 diabetes mellitus with diabetic chronic kidney disease: Secondary | ICD-10-CM | POA: Insufficient documentation

## 2018-05-12 DIAGNOSIS — J45909 Unspecified asthma, uncomplicated: Secondary | ICD-10-CM | POA: Insufficient documentation

## 2018-05-12 DIAGNOSIS — K259 Gastric ulcer, unspecified as acute or chronic, without hemorrhage or perforation: Secondary | ICD-10-CM | POA: Insufficient documentation

## 2018-05-12 DIAGNOSIS — D61818 Other pancytopenia: Secondary | ICD-10-CM | POA: Insufficient documentation

## 2018-05-12 DIAGNOSIS — N189 Chronic kidney disease, unspecified: Secondary | ICD-10-CM | POA: Insufficient documentation

## 2018-05-12 DIAGNOSIS — C9200 Acute myeloblastic leukemia, not having achieved remission: Secondary | ICD-10-CM

## 2018-05-12 DIAGNOSIS — D696 Thrombocytopenia, unspecified: Secondary | ICD-10-CM

## 2018-05-12 DIAGNOSIS — M1990 Unspecified osteoarthritis, unspecified site: Secondary | ICD-10-CM | POA: Insufficient documentation

## 2018-05-12 DIAGNOSIS — R531 Weakness: Secondary | ICD-10-CM | POA: Insufficient documentation

## 2018-05-12 DIAGNOSIS — E785 Hyperlipidemia, unspecified: Secondary | ICD-10-CM | POA: Insufficient documentation

## 2018-05-12 DIAGNOSIS — Z79899 Other long term (current) drug therapy: Secondary | ICD-10-CM | POA: Insufficient documentation

## 2018-05-12 DIAGNOSIS — I129 Hypertensive chronic kidney disease with stage 1 through stage 4 chronic kidney disease, or unspecified chronic kidney disease: Secondary | ICD-10-CM | POA: Insufficient documentation

## 2018-05-12 DIAGNOSIS — Z7984 Long term (current) use of oral hypoglycemic drugs: Secondary | ICD-10-CM | POA: Insufficient documentation

## 2018-05-12 DIAGNOSIS — K219 Gastro-esophageal reflux disease without esophagitis: Secondary | ICD-10-CM | POA: Insufficient documentation

## 2018-05-12 DIAGNOSIS — D649 Anemia, unspecified: Secondary | ICD-10-CM

## 2018-05-12 LAB — CBC WITH DIFFERENTIAL/PLATELET
Band Neutrophils: 0 %
Basophils Absolute: 0 10*3/uL (ref 0–0.1)
Basophils Relative: 0 %
Blasts: 60 %
Eosinophils Absolute: 0.4 10*3/uL (ref 0–0.7)
Eosinophils Relative: 4 %
HCT: 22 % — ABNORMAL LOW (ref 35.0–47.0)
Hemoglobin: 7.4 g/dL — ABNORMAL LOW (ref 12.0–16.0)
Lymphocytes Relative: 22 %
Lymphs Abs: 2.1 10*3/uL (ref 1.0–3.6)
MCH: 31.8 pg (ref 26.0–34.0)
MCHC: 33.7 g/dL (ref 32.0–36.0)
MCV: 94.3 fL (ref 80.0–100.0)
Metamyelocytes Relative: 0 %
Monocytes Absolute: 1.1 10*3/uL — ABNORMAL HIGH (ref 0.2–0.9)
Monocytes Relative: 12 %
Myelocytes: 0 %
Neutro Abs: 0.2 10*3/uL — ABNORMAL LOW (ref 1.4–6.5)
Neutrophils Relative %: 2 %
Other: 0 %
Platelets: 16 10*3/uL — CL (ref 150–440)
Promyelocytes Relative: 0 %
RBC: 2.33 MIL/uL — ABNORMAL LOW (ref 3.80–5.20)
RDW: 24.8 % — ABNORMAL HIGH (ref 11.5–14.5)
WBC: 9.4 10*3/uL (ref 3.6–11.0)
nRBC: 138 /100 WBC — ABNORMAL HIGH

## 2018-05-12 LAB — PREPARE RBC (CROSSMATCH)

## 2018-05-12 LAB — PLATELET COUNT: Platelets: 42 10*3/uL — ABNORMAL LOW (ref 150–440)

## 2018-05-12 MED ORDER — SODIUM CHLORIDE 0.9% FLUSH: 10.0000 mL | INTRAVENOUS | Status: DC | PRN

## 2018-05-12 MED ORDER — HEPARIN SOD (PORK) LOCK FLUSH 100 UNIT/ML IV SOLN: 500.0000 [IU] | Freq: Every day | INTRAVENOUS | Status: DC | PRN

## 2018-05-12 MED ORDER — ACETAMINOPHEN 325 MG PO TABS
ORAL_TABLET | ORAL | Status: AC
  Filled 2018-05-12: qty 2

## 2018-05-12 MED ORDER — SODIUM CHLORIDE 0.9% FLUSH: 3.0000 mL | INTRAVENOUS | Status: DC | PRN

## 2018-05-12 MED ORDER — SODIUM CHLORIDE 0.9% IV SOLUTION: 250.0000 mL | Freq: Once | INTRAVENOUS | Status: DC

## 2018-05-12 MED ORDER — HEPARIN SOD (PORK) LOCK FLUSH 100 UNIT/ML IV SOLN: 250.0000 [IU] | INTRAVENOUS | Status: DC | PRN

## 2018-05-12 MED ORDER — DIPHENHYDRAMINE HCL 25 MG PO CAPS
25.0000 mg | ORAL_CAPSULE | Freq: Once | ORAL | Status: AC
  Administered 2018-05-12: 25 mg via ORAL

## 2018-05-12 MED ORDER — DIPHENHYDRAMINE HCL 25 MG PO CAPS
ORAL_CAPSULE | ORAL | Status: AC
  Filled 2018-05-12: qty 1

## 2018-05-12 MED ORDER — ACETAMINOPHEN 325 MG PO TABS
650.0000 mg | ORAL_TABLET | Freq: Once | ORAL | Status: AC
  Administered 2018-05-12: 650 mg via ORAL

## 2018-05-13 LAB — TYPE AND SCREEN
ABO/RH(D): A POS
Antibody Screen: NEGATIVE
Unit division: 0

## 2018-05-13 LAB — BPAM PLATELET PHERESIS
Blood Product Expiration Date: 201909052359
ISSUE DATE / TIME: 201909041059
Unit Type and Rh: 5100

## 2018-05-13 LAB — BPAM RBC
Blood Product Expiration Date: 201909232359
ISSUE DATE / TIME: 201909041245
Unit Type and Rh: 6200

## 2018-05-13 LAB — PREPARE PLATELET PHERESIS: Unit division: 0

## 2018-05-14 ENCOUNTER — Inpatient Hospital Stay: Payer: Medicare Other

## 2018-05-14 ENCOUNTER — Encounter: Payer: Self-pay | Admitting: Hematology and Oncology

## 2018-05-14 ENCOUNTER — Telehealth: Payer: Self-pay | Admitting: *Deleted

## 2018-05-14 ENCOUNTER — Inpatient Hospital Stay (HOSPITAL_BASED_OUTPATIENT_CLINIC_OR_DEPARTMENT_OTHER): Payer: Medicare Other | Admitting: Hematology and Oncology

## 2018-05-14 ENCOUNTER — Other Ambulatory Visit: Payer: Self-pay

## 2018-05-14 VITALS — BP 163/85 | HR 94 | Temp 98.8°F | Resp 18

## 2018-05-14 VITALS — BP 124/71 | HR 108 | Temp 97.6°F | Resp 16 | Wt 115.0 lb

## 2018-05-14 DIAGNOSIS — K219 Gastro-esophageal reflux disease without esophagitis: Secondary | ICD-10-CM | POA: Diagnosis not present

## 2018-05-14 DIAGNOSIS — D649 Anemia, unspecified: Secondary | ICD-10-CM

## 2018-05-14 DIAGNOSIS — E1122 Type 2 diabetes mellitus with diabetic chronic kidney disease: Secondary | ICD-10-CM | POA: Diagnosis not present

## 2018-05-14 DIAGNOSIS — D61818 Other pancytopenia: Secondary | ICD-10-CM | POA: Diagnosis not present

## 2018-05-14 DIAGNOSIS — Z7984 Long term (current) use of oral hypoglycemic drugs: Secondary | ICD-10-CM | POA: Diagnosis not present

## 2018-05-14 DIAGNOSIS — Z79899 Other long term (current) drug therapy: Secondary | ICD-10-CM

## 2018-05-14 DIAGNOSIS — J45909 Unspecified asthma, uncomplicated: Secondary | ICD-10-CM | POA: Diagnosis not present

## 2018-05-14 DIAGNOSIS — I129 Hypertensive chronic kidney disease with stage 1 through stage 4 chronic kidney disease, or unspecified chronic kidney disease: Secondary | ICD-10-CM | POA: Diagnosis not present

## 2018-05-14 DIAGNOSIS — R531 Weakness: Secondary | ICD-10-CM | POA: Diagnosis not present

## 2018-05-14 DIAGNOSIS — C9200 Acute myeloblastic leukemia, not having achieved remission: Secondary | ICD-10-CM

## 2018-05-14 DIAGNOSIS — D696 Thrombocytopenia, unspecified: Secondary | ICD-10-CM

## 2018-05-14 DIAGNOSIS — K2970 Gastritis, unspecified, without bleeding: Secondary | ICD-10-CM | POA: Diagnosis not present

## 2018-05-14 DIAGNOSIS — R17 Unspecified jaundice: Secondary | ICD-10-CM

## 2018-05-14 DIAGNOSIS — N189 Chronic kidney disease, unspecified: Secondary | ICD-10-CM | POA: Diagnosis not present

## 2018-05-14 DIAGNOSIS — D708 Other neutropenia: Secondary | ICD-10-CM

## 2018-05-14 DIAGNOSIS — K259 Gastric ulcer, unspecified as acute or chronic, without hemorrhage or perforation: Secondary | ICD-10-CM | POA: Diagnosis not present

## 2018-05-14 DIAGNOSIS — M1990 Unspecified osteoarthritis, unspecified site: Secondary | ICD-10-CM | POA: Diagnosis not present

## 2018-05-14 DIAGNOSIS — E785 Hyperlipidemia, unspecified: Secondary | ICD-10-CM | POA: Diagnosis not present

## 2018-05-14 LAB — CBC WITH DIFFERENTIAL/PLATELET
Band Neutrophils: 0 %
Basophils Absolute: 0 10*3/uL (ref 0–0.1)
Basophils Relative: 0 %
Blasts: 65 %
Eosinophils Absolute: 0.8 10*3/uL — ABNORMAL HIGH (ref 0–0.7)
Eosinophils Relative: 6 %
HCT: 19.9 % — ABNORMAL LOW (ref 35.0–47.0)
Hemoglobin: 6.6 g/dL — ABNORMAL LOW (ref 12.0–16.0)
Lymphocytes Relative: 21 %
Lymphs Abs: 2.7 10*3/uL (ref 1.0–3.6)
MCH: 31 pg (ref 26.0–34.0)
MCHC: 33.2 g/dL (ref 32.0–36.0)
MCV: 93.2 fL (ref 80.0–100.0)
Metamyelocytes Relative: 0 %
Monocytes Absolute: 0.6 10*3/uL (ref 0.2–0.9)
Monocytes Relative: 5 %
Myelocytes: 0 %
Neutro Abs: 0.4 10*3/uL — ABNORMAL LOW (ref 1.4–6.5)
Neutrophils Relative %: 3 %
Other: 0 %
Platelets: 31 10*3/uL — ABNORMAL LOW (ref 150–440)
Promyelocytes Relative: 0 %
RBC: 2.14 MIL/uL — ABNORMAL LOW (ref 3.80–5.20)
RDW: 20.5 % — ABNORMAL HIGH (ref 11.5–14.5)
Smear Review: DECREASED
WBC: 12.7 10*3/uL — ABNORMAL HIGH (ref 4.0–10.5)
nRBC: 78 /100 WBC — ABNORMAL HIGH

## 2018-05-14 LAB — COMPREHENSIVE METABOLIC PANEL
ALT: 13 U/L (ref 0–44)
AST: 28 U/L (ref 15–41)
Albumin: 3.2 g/dL — ABNORMAL LOW (ref 3.5–5.0)
Alkaline Phosphatase: 65 U/L (ref 38–126)
Anion gap: 10 (ref 5–15)
BUN: 17 mg/dL (ref 8–23)
CO2: 26 mmol/L (ref 22–32)
Calcium: 8.5 mg/dL — ABNORMAL LOW (ref 8.9–10.3)
Chloride: 94 mmol/L — ABNORMAL LOW (ref 98–111)
Creatinine, Ser: 0.59 mg/dL (ref 0.44–1.00)
GFR calc Af Amer: >60 mL/min (ref >60)
GFR calc non Af Amer: >60 mL/min (ref >60)
Glucose, Bld: 175 mg/dL — ABNORMAL HIGH (ref 70–99)
Potassium: 3.7 mmol/L (ref 3.5–5.1)
Sodium: 130 mmol/L — ABNORMAL LOW (ref 135–145)
Total Bilirubin: 2.8 mg/dL — ABNORMAL HIGH (ref 0.3–1.2)
Total Protein: 7 g/dL (ref 6.5–8.1)

## 2018-05-14 LAB — MAGNESIUM: Magnesium: 1.4 mg/dL — ABNORMAL LOW (ref 1.7–2.4)

## 2018-05-14 LAB — BILIRUBIN, DIRECT: Bilirubin, Direct: 0.7 mg/dL — ABNORMAL HIGH (ref 0.0–0.2)

## 2018-05-14 LAB — SAMPLE TO BLOOD BANK

## 2018-05-14 LAB — PATHOLOGIST SMEAR REVIEW

## 2018-05-14 LAB — PREPARE RBC (CROSSMATCH)

## 2018-05-14 MED ORDER — ACETAMINOPHEN 325 MG PO TABS
650.0000 mg | ORAL_TABLET | Freq: Once | ORAL | Status: AC
  Administered 2018-05-14: 650 mg via ORAL
  Filled 2018-05-14: qty 2

## 2018-05-14 MED ORDER — DIPHENHYDRAMINE HCL 25 MG PO CAPS
25.0000 mg | ORAL_CAPSULE | Freq: Once | ORAL | Status: AC
  Administered 2018-05-14: 25 mg via ORAL
  Filled 2018-05-14: qty 1

## 2018-05-14 MED ORDER — SODIUM CHLORIDE 0.9% FLUSH
10.0000 mL | INTRAVENOUS | Status: AC | PRN
  Filled 2018-05-14: qty 10

## 2018-05-14 MED ORDER — SODIUM CHLORIDE 0.9% IV SOLUTION
250.0000 mL | Freq: Once | INTRAVENOUS | Status: AC
  Administered 2018-05-14: 250 mL via INTRAVENOUS
  Filled 2018-05-14: qty 250

## 2018-05-14 MED ORDER — MAGNESIUM SULFATE 2 GM/50ML IV SOLN
2.0000 g | Freq: Once | INTRAVENOUS | Status: AC
  Administered 2018-05-14: 2 g via INTRAVENOUS
  Filled 2018-05-14: qty 50

## 2018-05-14 MED ORDER — SODIUM CHLORIDE 0.9% FLUSH
3.0000 mL | INTRAVENOUS | Status: AC | PRN
  Filled 2018-05-14: qty 3

## 2018-05-14 NOTE — Unmapped (Signed)
Hi Dr. Lonni Fix,    Dr. Carlis Abbott has contacted the Communication Center requesting to speak with you directly regarding the following:    Patient Tracey Lang is in clinic today. Concerned that infusions are not effective, would like to speak with you while patient is in clinic.    Dr. Tamela Oddi is available for a call back at [phone].    A page has also been sent.    Thank you,  Kelli Hope  [facility]  [phone]

## 2018-05-14 NOTE — Unmapped (Signed)
I called the [facility] know the discussion that Dr. Merlene Pulling and I had earlier.     As she still has a high degree of circulating blasts, I reviewed that doing a bone marrow biopsy is not necessary as this will surely show persistent leukemia.    I noted that I would still proceed with a second cycle of aza/ven as it can take up to 2 cycles to respond. She will begin this on 9/16 locally. We will cancel her BM bx on 9/10 and visit with me on 9/17 and move ahead to see me on 10/1 or 10/8 for a follow up.    I told him that I would recommend starting on posaconazole in the meantime. When this starts, she will need to reduce her dose of venetoclax. I am copying my pharmD to this note so that she can call the husband to confirm instructions on how to start the posa due to the med interaction with ven (given that we need to move forward her appt with me on 9/17 as she will be proceeding with ven this day).     We will send communication to Dr. Merlene Pulling office as well so that she is in the loop on the plan.

## 2018-05-14 NOTE — Progress Notes (Signed)
[facility] day:  05/14/2018  Chief Complaint: Julie Holland is a 77 y.o. 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  HPI:  The patient was last seen in the hematology clinic on 05/07/2018.  At that time, she was seen for initial assessment after diagnosis of AML.  She was day 15 after cycle #1 azacytidine and Ventetoclax.  She was pancytopenic and transfusion dependent.  Hematocrit was 21.8, hemoglobin 7.4, platelets 9,000, WBC 6100 with an ANC of 100. Blasts were 28%.  She received 2 units of PRBCs and 1 unit of pheresed platelets.  She was seen at [facility] on 05/10/2018 (Labor Day).  Hematocrit was 27.5, hemoglobin 9.0, platelets 15,000, WBC 6700 with ANC of 100.  Blasts were 57%.  Labs on 05/12/2018 revealed a hematocrit of 22, hemoglobin 7.4, platelets 16,000, WBC 9400 with an ANC of 200.  Blasts were 60%.  She received 1 unit of PRBCs and 1 unit of pheresed platelets.  Post platelet count was 42,000.  During the interim, she has remained weak.  She came home yesterday from rehab.  Sleep has been poor.  She denies any fever or bleeding.  Her left arm is bruised.  Appetite is 50%.  She has shortness of breath on exertion.   Past Medical History:  Diagnosis Date  . Arthritis   . Asthma   . 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  . Cataract   . Chronic kidney disease    stage 1  . Diabetes mellitus 2007   type II  . Dry eye of right side   . GERD (gastroesophageal reflux disease)    OCC  . Glaucoma 2018   RIGHT EYE   . Hemorrhoids   . Hyperlipidemia   . Hypertension     Past Surgical History:  Procedure Laterality Date  . CATARACT EXTRACTION, BILATERAL    . CHOLECYSTECTOMY    . COLONOSCOPY  02/13/13   Dr Bary Castilla  . EPIGASTRIC HERNIA REPAIR N/A 03/20/2015   Procedure: HERNIA REPAIR EPIGASTRIC ADULT;  Surgeon:  Robert Bellow, MD;  Location: [facility] ORS;  Service: General;  Laterality: N/A;  . ESOPHAGOGASTRODUODENOSCOPY N/A 02/19/2018   Procedure: ESOPHAGOGASTRODUODENOSCOPY (EGD);  Surgeon: Lin Landsman, MD;  Location: [facility] ENDOSCOPY;  Service: Gastroenterology;  Laterality: N/A;  . HEMORRHOID SURGERY N/A 03/20/2015    FOCAL HIGH-GRADE SQUAMOUS INTRAEPITHELIAL LESION (HSIL, ANAL /HEMORRHOIDECTOMY;   Robert Bellow, MD [facility] ORS;  : General;  Laterality: N/A;  . HERNIA REPAIR  July 2016   Epigastric hernia, primary repair  . INCISION AND DRAINAGE Right 02/11/2018   Procedure: INCISION AND DRAINAGE- RIGHT HAND;  Surgeon: Hessie Knows, MD;  Location: [facility] ORS;  Service: Orthopedics;  Laterality: Right;  . TONSILLECTOMY  age 42  . TOTAL ABDOMINAL HYSTERECTOMY  01/1989  . TUBAL LIGATION    . 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  Case has been presented at the [facility] tumor board. No indication for additional treatment outside of serial exams  Surgeon: Robert Bellow, MD;  Location: [facility] ORS;  Service: General;  Laterality: N/A;    Family History  Problem Relation Age of Onset  . Bladder Cancer Mother   . Colon cancer Father   . Lung cancer Brother   .  Lymphoma Sister   . Breast cancer Neg Hx     Social History:  reports that she has never smoked. She has never used smokeless tobacco. She reports that she does not drink alcohol or use drugs.  She lives in [place] with her husband.  She denies any exposure to radiations or toxins.  She was discharged from rehabilitation yesterday.  Her husband's name is [name].  The patient is accompanied by her husband today.  Allergies:  Allergies  Allergen Reactions  . Hydrocodone Nausea And Vomiting and Other (See Comments)    Vertigo but patient takes hydrocodone/acetaminophen outpatient    Current Medications: Current Outpatient Medications  Medication Sig  Dispense Refill  . ACCU-CHEK SMARTVIEW test strip USE AS INSTRUCTED TO CHECK  BLOOD GLUCOSE ONCE DAILY if desired; LON 99 months; Dx E11.9 100 each 1  . acetaminophen (TYLENOL) 500 MG tablet Take 500-1,000 mg by mouth every 6 (six) hours as needed (for pain).    . ADVAIR DISKUS 100-50 MCG/DOSE AEPB USE 1 PUFF TWO TIMES DAILY (Patient taking differently: daily) 3 each 2  . albuterol (PROVENTIL HFA;VENTOLIN HFA) 108 (90 Base) MCG/ACT inhaler Inhale 2 puffs into the lungs every 6 (six) hours as needed for wheezing or shortness of breath. 1 Inhaler 2  . Carboxymeth-Glycerin-Polysorb (REFRESH OPTIVE ADVANCED OP) Place 1 drop into both eyes 3 (three) times daily as needed (for dry/irritated eyes).    . carvedilol (COREG) 6.25 MG tablet Take 1 tablet (6.25 mg total) by mouth 2 (two) times daily with a meal. 180 tablet 1  . ferrous sulfate 325 (65 FE) MG tablet Take 1 tablet (325 mg total) by mouth daily.    Marland Kitchen latanoprost (XALATAN) 0.005 % ophthalmic solution Place 1 drop at bedtime into the right eye.    Marland Kitchen lisinopril (PRINIVIL,ZESTRIL) 10 MG tablet TAKE 1 TABLET BY MOUTH  DAILY 90 tablet 1  . metFORMIN (GLUCOPHAGE) 850 MG tablet Take 1 tablet (850 mg total) by mouth 2 (two) times daily with a meal. 180 tablet 0  . Multiple Vitamin (MULTIVITAMIN) tablet Take 2 tablets by mouth daily.     . Nutritional Supplements (ESTROVEN PO) Take 1 tablet by mouth daily.    . pantoprazole (PROTONIX) 40 MG tablet Take 1 tablet (40 mg total) by mouth 2 (two) times daily before a meal. 180 tablet 0  . simvastatin (ZOCOR) 20 MG tablet Take 1 tablet (20 mg total) by mouth at bedtime. 90 tablet 0   No current facility-administered medications for this visit.     Review of Systems  Constitutional: Negative for chills, diaphoresis, fever, malaise/fatigue and weight loss.       Feels weak today.  HENT: Negative.  Negative for congestion, ear discharge, nosebleeds, sinus pain, sore throat and tinnitus.   Eyes: Negative.   Negative for blurred vision, double vision, pain and discharge.  Respiratory: Positive for shortness of breath (with exertion). Negative for cough, hemoptysis, sputum production and wheezing.   Cardiovascular: Negative.  Negative for chest pain, palpitations, orthopnea, leg swelling and PND.  Gastrointestinal: Negative for abdominal pain, blood in stool, constipation, diarrhea, heartburn, melena, nausea and vomiting.       Appetite 50%.  Genitourinary: Negative for dysuria, frequency, hematuria and urgency.  Musculoskeletal: Negative for back pain, falls, joint pain and myalgias.  Skin: Negative.  Negative for itching and rash.  Neurological: Negative for dizziness, tremors, sensory change, speech change, focal weakness, weakness (general) and headaches.  Endo/Heme/Allergies: Does not bruise/bleed easily.  Diabetes  Psychiatric/Behavioral: Negative for depression, memory loss and suicidal ideas. The patient is not nervous/anxious and does not have insomnia (poor sleep).   All other systems reviewed and are negative.  Physical Exam: Blood pressure 124/71, pulse (!) 108, temperature 97.6 F (36.4 C), temperature source Tympanic, resp. rate 16, weight 115 lb (52.2 kg), SpO2 93 %. GENERAL:  Thin elderly woman sitting comfortably in a wheelchair in the exam room in no acute distress. MENTAL STATUS:  Alert and oriented to person, place and time. HEAD:  Short gray hair.  Normocephalic, atraumatic, face symmetric, no Cushingoid features. EYES:  Brown eyes.  Pupils equal round and reactive to light and accomodation.  No conjunctivitis or scleral icterus. ENT:  Oropharynx clear without lesion.  Tongue normal. Mucous membranes moist.  RESPIRATORY:  Clear to auscultation without rales, wheezes or rhonchi. CARDIOVASCULAR:  Regular rate and rhythm without murmur, rub or gallop. ABDOMEN:  Soft, non-tender, with active bowel sounds, and no hepatosplenomegaly.  No masses. SKIN:  Left forearm ecchymosis.   Significant sacral ecchymosis. EXTREMITIES: Left lower extremity edema.  No palpable cords. LYMPH NODES: No palpable cervical, supraclavicular, axillary or inguinal adenopathy  NEUROLOGICAL: Unremarkable. PSYCH:  Appropriate.    Orders Only on 05/14/2018  Component Date Value Ref Range Status  . Magnesium 05/14/2018 1.4* 1.7 - 2.4 mg/dL Final   Performed at [facility], [address] [place], [place] [address]  . Sodium 05/14/2018 130* 135 - 145 mmol/L Final  . Potassium 05/14/2018 3.7  3.5 - 5.1 mmol/L Final  . Chloride 05/14/2018 94* 98 - 111 mmol/L Final  . CO2 05/14/2018 26  22 - 32 mmol/L Final  . Glucose, Bld 05/14/2018 175* 70 - 99 mg/dL Final  . BUN 05/14/2018 17  8 - 23 mg/dL Final  . Creatinine, Ser 05/14/2018 0.59  0.44 - 1.00 mg/dL Final  . Calcium 05/14/2018 8.5* 8.9 - 10.3 mg/dL Final  . Total Protein 05/14/2018 7.0  6.5 - 8.1 g/dL Final  . Albumin 05/14/2018 3.2* 3.5 - 5.0 g/dL Final  . AST 05/14/2018 28  15 - 41 U/L Final  . ALT 05/14/2018 13  0 - 44 U/L Final  . Alkaline Phosphatase 05/14/2018 65  38 - 126 U/L Final  . Total Bilirubin 05/14/2018 2.8* 0.3 - 1.2 mg/dL Final  . GFR calc non Af Amer 05/14/2018 >60  >60 mL/min Final  . GFR calc Af Amer 05/14/2018 >60  >60 mL/min Final   Comment: (NOTE) The eGFR has been calculated using the CKD EPI equation. This calculation has not been validated in all clinical situations. eGFR's persistently <60 mL/min signify possible Chronic Kidney Disease.   Georgiann Hahn gap 05/14/2018 10  5 - 15 Final   Performed at [facility], [address] [place], [place] [address]  . WBC 05/14/2018 PENDING  4.0 - 10.5 K/uL Incomplete  . RBC 05/14/2018 2.14* 3.80 - 5.20 MIL/uL Final  . Hemoglobin 05/14/2018 6.6* 12.0 - 16.0 g/dL Final  . HCT 05/14/2018 19.9* 35.0 - 47.0 % Final  . MCV 05/14/2018 93.2  80.0 - 100.0 fL Final  . MCH 05/14/2018 31.0  26.0 - 34.0 pg Final  . MCHC 05/14/2018 33.2  32.0 - 36.0 g/dL Final  .  RDW 05/14/2018 20.5* 11.5 - 14.5 % Final  . Platelets 05/14/2018 31* 150 - 440 K/uL Final   Performed at [facility], [address] [place], [place] [address]  . Neutrophils Relative % 05/14/2018 PENDING  % Incomplete  .  Neutro Abs 05/14/2018 PENDING  1.7 - 7.7 K/uL Incomplete  . Band Neutrophils 05/14/2018 PENDING  % Incomplete  . Lymphocytes Relative 05/14/2018 PENDING  % Incomplete  . Lymphs Abs 05/14/2018 PENDING  0.7 - 4.0 K/uL Incomplete  . Monocytes Relative 05/14/2018 PENDING  % Incomplete  . Monocytes Absolute 05/14/2018 PENDING  0.1 - 1.0 K/uL Incomplete  . Eosinophils Relative 05/14/2018 PENDING  % Incomplete  . Eosinophils Absolute 05/14/2018 PENDING  0.0 - 0.7 K/uL Incomplete  . Basophils Relative 05/14/2018 PENDING  % Incomplete  . Basophils Absolute 05/14/2018 PENDING  0.0 - 0.1 K/uL Incomplete  . WBC Morphology 05/14/2018 PENDING   Incomplete  . RBC Morphology 05/14/2018 PENDING   Incomplete  . Smear Review 05/14/2018 PENDING   Incomplete  . Other 05/14/2018 PENDING  % Incomplete  . nRBC 05/14/2018 PENDING  0 /100 WBC Incomplete  . Metamyelocytes Relative 05/14/2018 PENDING  % Incomplete  . Myelocytes 05/14/2018 PENDING  % Incomplete  . Promyelocytes Relative 05/14/2018 PENDING  % Incomplete  . Blasts 05/14/2018 PENDING  % Incomplete    Assessment:  ZAMARIA BRAZZLE is a 77 y.o. 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  She presented with symptomatic anemia (syncope) and 20% circulating blasts.  Bone marrow biopsy on 04/19/2018 was consistent with AML.  Cytogenetics were normal (77, XX)  on first 10 metaphases.  MLL FISH revealed no MLL rearrangement. BEAT AML trial was offered and declined.    She is day 22 of cycle #1 azacytidine and venetoclax (08/16/20219 - 04/29/2018).  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   She has a history of rectal bleeding requiring 2 units of PRBCs on 07/31/2016.  She underwent oversewing of  a bleeding arterial vessel at the site of previous mucosal resection.   She was admitted to [facility] from 02/10/2018 - 02/13/2018 with MSSA sepsis in the setting of right wrist abscess after a fall and injury.  TEE was negative.  She underwent incision and drainage and was treated with IV cefazolin x 6 weeks.  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  She was admitted to [facility] from 02/18/2018 - 02/20/2018 with a GI bleed.  She received 2 units of PRBCs.  EGD revealed gastric ulcers which were non-bleeding and gastritis.    She is transfusion dependent.  Blood products are leukopoor and irradiated.  Peripheral blasts were 28% on 05/07/2018, 57% on 05/10/2018, 60% on 05/12/2018, and 65% blasts on 05/14/2018.   She has mild hyperbilirubinemia of unclear etiology.  Bilirubin was 1.7 on 05/07/2018 and 2.8 on 05/14/2018.  Symptomatically, she remains weak and fatigued.  She denies any fever or bleeding.  Exam reveals marked ecchymosis in left forearm and sacral region.  Hemoglobin is 6.6.  Platelets are 31,000.  ANC is 400.    Plan: 1.   Labs today:  CBC with diff, CMP, direct bilirubin, Mg, hold tube. 2.   Acute myelogenous leukemia:  Continue venetoclax.  Discuss increasing blasts worrisome for progressive AML.  Phone follow-up with Dr. Prince Solian- no other good options.  Plan for additional cycle of treatment with azacytidine and venetoclax. 3.   Anemia and thrombocytopenia:   Transfuse 2 units of PRBCs.   Platelets adequate. 4.   Neutropenia:  Continue neutropenic precautions. 5.   Mildly elevated bilirubin:  Fractionate bilirubin  Coombs. 6:   Hypomagnesemia:  Magnesium sulfate  2 gm IV today. 7.   RTC on 05/21/2018 for MD assessment, labs (CBC with diff, CMP, hold tube), and +/- transfusion.  8.   RTC on 05/24/2018 for MD assessment, labs (CBC with diff, CMP, Mg), +/-  transfusion, and initiation of cycle #2 azacytidine.   Lequita Asal, MD  05/14/2018, 4:27 PM

## 2018-05-14 NOTE — Progress Notes (Signed)
Pt in for follow up, released 2days ago from [employer].  Pt reports "very tired today and not energy".

## 2018-05-14 NOTE — Telephone Encounter (Signed)
Dr Reuel Derby called to report that patient Blast count is 65% up form previous 28%. Dr Mike Gip informed verbally

## 2018-05-15 LAB — TYPE AND SCREEN
ABO/RH(D): A POS
Antibody Screen: NEGATIVE
Unit division: 0
Unit division: 0

## 2018-05-15 LAB — BPAM RBC
Blood Product Expiration Date: 201909112359
Blood Product Expiration Date: 201909282359
ISSUE DATE / TIME: 201909061228
ISSUE DATE / TIME: 201909061437
Unit Type and Rh: 6200
Unit Type and Rh: 9500

## 2018-05-17 ENCOUNTER — Inpatient Hospital Stay: Payer: Medicare Other

## 2018-05-17 ENCOUNTER — Other Ambulatory Visit: Payer: Self-pay | Admitting: Hematology and Oncology

## 2018-05-17 ENCOUNTER — Other Ambulatory Visit: Payer: Self-pay

## 2018-05-17 DIAGNOSIS — Z7984 Long term (current) use of oral hypoglycemic drugs: Secondary | ICD-10-CM | POA: Diagnosis not present

## 2018-05-17 DIAGNOSIS — E1122 Type 2 diabetes mellitus with diabetic chronic kidney disease: Secondary | ICD-10-CM | POA: Diagnosis not present

## 2018-05-17 DIAGNOSIS — I129 Hypertensive chronic kidney disease with stage 1 through stage 4 chronic kidney disease, or unspecified chronic kidney disease: Secondary | ICD-10-CM | POA: Diagnosis not present

## 2018-05-17 DIAGNOSIS — C9200 Acute myeloblastic leukemia, not having achieved remission: Secondary | ICD-10-CM

## 2018-05-17 DIAGNOSIS — M1990 Unspecified osteoarthritis, unspecified site: Secondary | ICD-10-CM | POA: Diagnosis not present

## 2018-05-17 DIAGNOSIS — Z79899 Other long term (current) drug therapy: Secondary | ICD-10-CM | POA: Diagnosis not present

## 2018-05-17 DIAGNOSIS — K219 Gastro-esophageal reflux disease without esophagitis: Secondary | ICD-10-CM | POA: Diagnosis not present

## 2018-05-17 DIAGNOSIS — D61818 Other pancytopenia: Secondary | ICD-10-CM | POA: Diagnosis not present

## 2018-05-17 DIAGNOSIS — R531 Weakness: Secondary | ICD-10-CM | POA: Diagnosis not present

## 2018-05-17 DIAGNOSIS — E785 Hyperlipidemia, unspecified: Secondary | ICD-10-CM | POA: Diagnosis not present

## 2018-05-17 DIAGNOSIS — D630 Anemia in neoplastic disease: Secondary | ICD-10-CM

## 2018-05-17 DIAGNOSIS — N189 Chronic kidney disease, unspecified: Secondary | ICD-10-CM | POA: Diagnosis not present

## 2018-05-17 DIAGNOSIS — K2970 Gastritis, unspecified, without bleeding: Secondary | ICD-10-CM | POA: Diagnosis not present

## 2018-05-17 DIAGNOSIS — J45909 Unspecified asthma, uncomplicated: Secondary | ICD-10-CM | POA: Diagnosis not present

## 2018-05-17 DIAGNOSIS — K259 Gastric ulcer, unspecified as acute or chronic, without hemorrhage or perforation: Secondary | ICD-10-CM | POA: Diagnosis not present

## 2018-05-17 LAB — CBC WITH DIFFERENTIAL/PLATELET
Band Neutrophils: 0 %
Basophils Absolute: 0 10*3/uL (ref 0–0.1)
Basophils Relative: 0 %
Blasts: 64 %
Eosinophils Absolute: 1 10*3/uL — ABNORMAL HIGH (ref 0–0.7)
Eosinophils Relative: 4 %
HCT: 17 % — ABNORMAL LOW (ref 35.0–47.0)
Hemoglobin: 5.7 g/dL — ABNORMAL LOW (ref 12.0–16.0)
Lymphocytes Relative: 22 %
Lymphs Abs: 5.4 10*3/uL — ABNORMAL HIGH (ref 1.0–3.6)
MCH: 30.1 pg (ref 26.0–34.0)
MCHC: 33.5 g/dL (ref 32.0–36.0)
MCV: 89.7 fL (ref 80.0–100.0)
Metamyelocytes Relative: 0 %
Monocytes Absolute: 1.5 10*3/uL — ABNORMAL HIGH (ref 0.2–0.9)
Monocytes Relative: 6 %
Myelocytes: 0 %
Neutro Abs: 1 10*3/uL — ABNORMAL LOW (ref 1.4–6.5)
Neutrophils Relative %: 4 %
Other: 0 %
Platelets: 25 10*3/uL — CL (ref 150–400)
Promyelocytes Relative: 0 %
RBC: 1.89 MIL/uL — ABNORMAL LOW (ref 3.80–5.20)
RDW: 19.5 % — ABNORMAL HIGH (ref 11.5–14.5)
WBC: 24.6 10*3/uL — ABNORMAL HIGH (ref 4.0–10.5)
nRBC: 56 /100 WBC — ABNORMAL HIGH

## 2018-05-17 LAB — PREPARE RBC (CROSSMATCH)

## 2018-05-17 LAB — SAMPLE TO BLOOD BANK

## 2018-05-17 MED ORDER — ACETAMINOPHEN 325 MG PO TABS
650.0000 mg | ORAL_TABLET | Freq: Once | ORAL | Status: AC
  Administered 2018-05-17: 650 mg via ORAL
  Filled 2018-05-17: qty 2

## 2018-05-17 MED ORDER — SODIUM CHLORIDE 0.9% IV SOLUTION
250.0000 mL | Freq: Once | INTRAVENOUS | Status: AC
  Administered 2018-05-17: 250 mL via INTRAVENOUS
  Filled 2018-05-17: qty 250

## 2018-05-17 MED ORDER — DIPHENHYDRAMINE HCL 25 MG PO CAPS
25.0000 mg | ORAL_CAPSULE | Freq: Once | ORAL | Status: AC
  Administered 2018-05-17: 25 mg via ORAL
  Filled 2018-05-17: qty 1

## 2018-05-17 MED ORDER — LEVOFLOXACIN 500 MG TABLET
ORAL_TABLET | ORAL | 2 refills | 0 days
Start: 2018-05-17 — End: 2018-05-18

## 2018-05-17 NOTE — Unmapped (Signed)
I spoke with Ms. Tracey Lang today by phone to reiterate some important points regarding her venetoclax therapy.    As her ANC has been persistently < 0.5, she should start posaconazole. This has already been approved for manufacturer's assistance and we will call the manufacturer tomorrow to see if they can coordinate the initial fill and delivery with the patient. I instructed the patient to read the directions on the posaconazole bottles as these direct her through the load (300 mg BID x 1 day, followed by 300 mg daily thereafter). Importantly, we discussed that she will need to go down to venetoclax 100 mg daily (1 tablet) when she initiates the posaconazole. She and her husband confirmed understanding this point.     She should also start levofloxacin. The patient does not have this medication on hand. I sent the prescription for prophylaxis to her home pharmacy ([facility] in [place]).    Her and her husband expressed cocnern that they only have 10 days left of venetoclax. I re-educated on how refill coordination works through our [facility], and that refilling too early could reject for insurance purposes. Normally a week out is appropriate to refill. With her upcoming dose reduction to 100 mg daily, she should have plenty of medication.    She expressed concern about her upcoming appointment with Dr. Lonni Fix interfering with her azacitidine shot at her local oncologist which is scheduled on the same day. I confirmed with Dr. Lonni Fix that this can be rescheduled to a later date that works for her, and looped in our schedulers to help with this.     Please let me know if you have any concerns.      Manfred Arch, PharmD, CPP  Pager: [phone]

## 2018-05-17 NOTE — Unmapped (Signed)
Her husband is on his way to get her and asked I call him back later today or tomorrow morning. Is 10/1 okay? I know you mentioned the 24th to me before if 10/1 is too far out. Thanks [name]. Tracey Lang

## 2018-05-17 NOTE — Unmapped (Signed)
Just faxed over this note to Dr. Merlene Pulling. She said the husband with actually with her in the lobby. Dr. Merlene Pulling is still under the impression she is supposed to come on 9/17. Should this still be rescheduled? What about [facility] rescheduling?

## 2018-05-18 ENCOUNTER — Ambulatory Visit: Payer: Medicare Other | Admitting: Infectious Diseases

## 2018-05-18 MED ORDER — LEVOFLOXACIN 500 MG TABLET
ORAL_TABLET | ORAL | 2 refills | 0 days
Start: 2018-05-18 — End: ?

## 2018-05-18 NOTE — Unmapped (Signed)
Hi,     Pt's husband contacted the Communication Center regarding the following:    - medication was to be called into the pharmacy but nothing has been sent    Please contact Renae Fickle at [phone].    Thanks in advance,    Deatra Ina  [facility]   [phone]

## 2018-05-18 NOTE — Unmapped (Signed)
AOC Triage Note     Patient: Tracey Lang     Reason for call:  return call    Time call returned: 1543     Phone Assessment: No answer, busy signal.     Triage Recommendations: Spoke with [facility] in [place] and they received the fax of the prescription.     Patient Response: unknown     Outstanding tasks: Care team notification no further actions needed      Patient Pharmacy has been verified and primary pharmacy has been marked as preferred

## 2018-05-18 NOTE — Unmapped (Signed)
I spoke to Mr. Tracey Lang on the phone today to follow-up after my phone call with them yesterday.    I wanted to confirm that he set up delivery with the posaconazole manufacturer pharmacy. They did call him today and delivery has been scheduled for 9/12. He then had several of questions regarding signing for the package, etc. I provided the phone number for the pharmacy and encouraged him to call them if he has concerns regarding delivery as I don't have direct contact with the manufacturer.     We reviewed that when [name] starts the posaconazole she should go down to 1 tablet (100 mg) daily of venetoclax daily with food. I encouraged them to follow dosing directions of the posaconazole that is on the bottle.     I sent the prescription for levofloxacin 500 mg daily for antibacterial prophylaxis to their local pharmacy. I believe there was an issue in receipt of the prescription, so I resent this afternoon after talking to him. Ms. Tracey Lang can start that medication once she receives it.    I confirmed with the patient that they have switched Dr. Lisa Roca follow-up appointment from 9/17 to 10/1. He was happy to hear this was switched because they have another appointment on 9/17 locally for her chemo.      Tracey Lang, PharmD, CPP  Pager: [phone]

## 2018-05-18 NOTE — Unmapped (Signed)
Outpatient Oncology SW Note: SW received a referral from [employer] requesting SW assistance with financial resources to assist pt.    SW was planning to try and meet with pt today to discuss.    Appt was changed. SW attempted to call pt to f/u [phone]).    Line was busy. SW unable to leave a message.    SW will try again another time.

## 2018-05-18 NOTE — Unmapped (Signed)
[facility] Triage Note     Patient: Tracey Lang     Reason for call:  return call    Time call returned: 1502     Phone Assessment: No answer.     Triage Recommendations: Will check with Kendal Hymen CPP to see if she could send the Levaquin to [facility] in [place] for patient.     Patient Response: unknown     Outstanding tasks: follow up with patient after prescription was sent.     Patient Pharmacy has been verified and primary pharmacy has been marked as preferred

## 2018-05-18 NOTE — Unmapped (Signed)
Hi,     Nurse Synetta Fail with Dr. Obie Dredge office at [facility] contacted the Communication Center requesting to speak with the care team of Tracey Lang to discuss:    Requesting dosing instructions given to patient's spouse be faxed to their office. Concerned that patient's spouse is an unreliable reporter and want to be certain they have correct information regarding patient's posaconazole dosing.     Please contact Nurse Synetta Fail at [phone]. Notes can be faxed to [phone].    Thank you,   Kelli Hope  [facility] Communication [facility]   [phone]

## 2018-05-19 ENCOUNTER — Ambulatory Visit: Payer: Medicare Other | Admitting: Infectious Diseases

## 2018-05-19 ENCOUNTER — Inpatient Hospital Stay: Payer: Medicare Other

## 2018-05-19 VITALS — BP 132/81 | HR 92 | Temp 97.1°F | Resp 18

## 2018-05-19 DIAGNOSIS — K2970 Gastritis, unspecified, without bleeding: Secondary | ICD-10-CM | POA: Diagnosis not present

## 2018-05-19 DIAGNOSIS — E785 Hyperlipidemia, unspecified: Secondary | ICD-10-CM | POA: Diagnosis not present

## 2018-05-19 DIAGNOSIS — Z7984 Long term (current) use of oral hypoglycemic drugs: Secondary | ICD-10-CM | POA: Diagnosis not present

## 2018-05-19 DIAGNOSIS — M1990 Unspecified osteoarthritis, unspecified site: Secondary | ICD-10-CM | POA: Diagnosis not present

## 2018-05-19 DIAGNOSIS — K219 Gastro-esophageal reflux disease without esophagitis: Secondary | ICD-10-CM | POA: Diagnosis not present

## 2018-05-19 DIAGNOSIS — C9200 Acute myeloblastic leukemia, not having achieved remission: Secondary | ICD-10-CM

## 2018-05-19 DIAGNOSIS — N189 Chronic kidney disease, unspecified: Secondary | ICD-10-CM | POA: Diagnosis not present

## 2018-05-19 DIAGNOSIS — D61818 Other pancytopenia: Secondary | ICD-10-CM | POA: Diagnosis not present

## 2018-05-19 DIAGNOSIS — K259 Gastric ulcer, unspecified as acute or chronic, without hemorrhage or perforation: Secondary | ICD-10-CM | POA: Diagnosis not present

## 2018-05-19 DIAGNOSIS — E1122 Type 2 diabetes mellitus with diabetic chronic kidney disease: Secondary | ICD-10-CM | POA: Diagnosis not present

## 2018-05-19 DIAGNOSIS — I129 Hypertensive chronic kidney disease with stage 1 through stage 4 chronic kidney disease, or unspecified chronic kidney disease: Secondary | ICD-10-CM | POA: Diagnosis not present

## 2018-05-19 DIAGNOSIS — D630 Anemia in neoplastic disease: Secondary | ICD-10-CM

## 2018-05-19 DIAGNOSIS — Z79899 Other long term (current) drug therapy: Secondary | ICD-10-CM | POA: Diagnosis not present

## 2018-05-19 DIAGNOSIS — J45909 Unspecified asthma, uncomplicated: Secondary | ICD-10-CM | POA: Diagnosis not present

## 2018-05-19 DIAGNOSIS — R531 Weakness: Secondary | ICD-10-CM | POA: Diagnosis not present

## 2018-05-19 DIAGNOSIS — D649 Anemia, unspecified: Secondary | ICD-10-CM

## 2018-05-19 DIAGNOSIS — D696 Thrombocytopenia, unspecified: Secondary | ICD-10-CM

## 2018-05-19 LAB — CBC WITH DIFFERENTIAL/PLATELET
Basophils Absolute: 0 10*3/uL (ref 0–0.1)
Basophils Relative: 0 %
Blasts: 73 %
Eosinophils Absolute: 1.7 10*3/uL — ABNORMAL HIGH (ref 0–0.7)
Eosinophils Relative: 8 %
HCT: 21.9 % — ABNORMAL LOW (ref 35.0–47.0)
Hemoglobin: 7.2 g/dL — ABNORMAL LOW (ref 12.0–16.0)
Lymphocytes Relative: 13 %
Lymphs Abs: 2.7 10*3/uL (ref 1.0–3.6)
MCH: 30 pg (ref 26.0–34.0)
MCHC: 33.1 g/dL (ref 32.0–36.0)
MCV: 90.5 fL (ref 80.0–100.0)
Monocytes Absolute: 0.8 10*3/uL (ref 0.2–0.9)
Monocytes Relative: 4 %
Neutro Abs: 0.4 10*3/uL — ABNORMAL LOW (ref 1.4–6.5)
Neutrophils Relative %: 2 %
Platelets: 28 10*3/uL — CL (ref 150–400)
RBC: 2.42 MIL/uL — ABNORMAL LOW (ref 3.80–5.20)
RDW: 17.3 % — ABNORMAL HIGH (ref 11.5–14.5)
WBC: 20.7 10*3/uL — ABNORMAL HIGH (ref 4.0–10.5)
nRBC: 78 /100 WBC — ABNORMAL HIGH

## 2018-05-19 LAB — PREPARE RBC (CROSSMATCH)

## 2018-05-19 MED ORDER — ACETAMINOPHEN 325 MG PO TABS
650.0000 mg | ORAL_TABLET | Freq: Once | ORAL | Status: AC
  Administered 2018-05-19: 650 mg via ORAL
  Filled 2018-05-19: qty 2

## 2018-05-19 MED ORDER — DIPHENHYDRAMINE HCL 25 MG PO CAPS
25.0000 mg | ORAL_CAPSULE | Freq: Once | ORAL | Status: AC
  Administered 2018-05-19: 25 mg via ORAL
  Filled 2018-05-19: qty 1

## 2018-05-19 MED ORDER — SODIUM CHLORIDE 0.9% IV SOLUTION
250.0000 mL | Freq: Once | INTRAVENOUS | Status: AC
  Administered 2018-05-19: 250 mL via INTRAVENOUS
  Filled 2018-05-19: qty 250

## 2018-05-20 ENCOUNTER — Other Ambulatory Visit: Payer: Self-pay | Admitting: Hematology and Oncology

## 2018-05-20 DIAGNOSIS — C9200 Acute myeloblastic leukemia, not having achieved remission: Secondary | ICD-10-CM

## 2018-05-20 LAB — TYPE AND SCREEN
ABO/RH(D): A POS
Antibody Screen: NEGATIVE
Unit division: 0
Unit division: 0
Unit division: 0
Unit division: 0

## 2018-05-20 LAB — BPAM RBC
Blood Product Expiration Date: 201909182359
Blood Product Expiration Date: 201909182359
Blood Product Expiration Date: 201910042359
Blood Product Expiration Date: 201910042359
ISSUE DATE / TIME: 201909091038
ISSUE DATE / TIME: 201909091223
ISSUE DATE / TIME: 201909110941
ISSUE DATE / TIME: 201909111125
Unit Type and Rh: 5100
Unit Type and Rh: 600
Unit Type and Rh: 6200
Unit Type and Rh: 6200

## 2018-05-21 ENCOUNTER — Inpatient Hospital Stay: Payer: Medicare Other

## 2018-05-21 ENCOUNTER — Inpatient Hospital Stay (HOSPITAL_BASED_OUTPATIENT_CLINIC_OR_DEPARTMENT_OTHER): Payer: Medicare Other | Admitting: Hematology and Oncology

## 2018-05-21 ENCOUNTER — Other Ambulatory Visit (INDEPENDENT_AMBULATORY_CARE_PROVIDER_SITE_OTHER): Payer: Self-pay | Admitting: Vascular Surgery

## 2018-05-21 ENCOUNTER — Ambulatory Visit (INDEPENDENT_AMBULATORY_CARE_PROVIDER_SITE_OTHER): Payer: Medicare Other | Admitting: Vascular Surgery

## 2018-05-21 ENCOUNTER — Other Ambulatory Visit: Payer: Self-pay | Admitting: Hematology and Oncology

## 2018-05-21 ENCOUNTER — Other Ambulatory Visit (INDEPENDENT_AMBULATORY_CARE_PROVIDER_SITE_OTHER): Payer: Medicare Other

## 2018-05-21 ENCOUNTER — Encounter: Payer: Self-pay | Admitting: Hematology and Oncology

## 2018-05-21 ENCOUNTER — Encounter (INDEPENDENT_AMBULATORY_CARE_PROVIDER_SITE_OTHER): Payer: Self-pay | Admitting: Vascular Surgery

## 2018-05-21 ENCOUNTER — Other Ambulatory Visit: Payer: Self-pay

## 2018-05-21 VITALS — BP 163/83 | HR 112 | Resp 14 | Ht 63.0 in | Wt 123.0 lb

## 2018-05-21 VITALS — HR 96 | Temp 95.7°F | Wt 122.3 lb

## 2018-05-21 DIAGNOSIS — Z7984 Long term (current) use of oral hypoglycemic drugs: Secondary | ICD-10-CM | POA: Diagnosis not present

## 2018-05-21 DIAGNOSIS — C9200 Acute myeloblastic leukemia, not having achieved remission: Secondary | ICD-10-CM | POA: Diagnosis not present

## 2018-05-21 DIAGNOSIS — E1122 Type 2 diabetes mellitus with diabetic chronic kidney disease: Secondary | ICD-10-CM | POA: Diagnosis not present

## 2018-05-21 DIAGNOSIS — E782 Mixed hyperlipidemia: Secondary | ICD-10-CM | POA: Diagnosis not present

## 2018-05-21 DIAGNOSIS — J45909 Unspecified asthma, uncomplicated: Secondary | ICD-10-CM | POA: Diagnosis not present

## 2018-05-21 DIAGNOSIS — R233 Spontaneous ecchymoses: Secondary | ICD-10-CM

## 2018-05-21 DIAGNOSIS — N189 Chronic kidney disease, unspecified: Secondary | ICD-10-CM | POA: Diagnosis not present

## 2018-05-21 DIAGNOSIS — M7989 Other specified soft tissue disorders: Secondary | ICD-10-CM | POA: Insufficient documentation

## 2018-05-21 DIAGNOSIS — R600 Localized edema: Secondary | ICD-10-CM

## 2018-05-21 DIAGNOSIS — Z79899 Other long term (current) drug therapy: Secondary | ICD-10-CM | POA: Diagnosis not present

## 2018-05-21 DIAGNOSIS — R531 Weakness: Secondary | ICD-10-CM

## 2018-05-21 DIAGNOSIS — R238 Other skin changes: Secondary | ICD-10-CM

## 2018-05-21 DIAGNOSIS — R17 Unspecified jaundice: Secondary | ICD-10-CM

## 2018-05-21 DIAGNOSIS — E785 Hyperlipidemia, unspecified: Secondary | ICD-10-CM | POA: Diagnosis not present

## 2018-05-21 DIAGNOSIS — D61818 Other pancytopenia: Secondary | ICD-10-CM

## 2018-05-21 DIAGNOSIS — E0821 Diabetes mellitus due to underlying condition with diabetic nephropathy: Secondary | ICD-10-CM

## 2018-05-21 DIAGNOSIS — D649 Anemia, unspecified: Secondary | ICD-10-CM

## 2018-05-21 DIAGNOSIS — D696 Thrombocytopenia, unspecified: Secondary | ICD-10-CM

## 2018-05-21 DIAGNOSIS — I10 Essential (primary) hypertension: Secondary | ICD-10-CM | POA: Diagnosis not present

## 2018-05-21 DIAGNOSIS — I129 Hypertensive chronic kidney disease with stage 1 through stage 4 chronic kidney disease, or unspecified chronic kidney disease: Secondary | ICD-10-CM | POA: Diagnosis not present

## 2018-05-21 DIAGNOSIS — M1990 Unspecified osteoarthritis, unspecified site: Secondary | ICD-10-CM | POA: Diagnosis not present

## 2018-05-21 DIAGNOSIS — K2970 Gastritis, unspecified, without bleeding: Secondary | ICD-10-CM | POA: Diagnosis not present

## 2018-05-21 DIAGNOSIS — D708 Other neutropenia: Secondary | ICD-10-CM

## 2018-05-21 DIAGNOSIS — K219 Gastro-esophageal reflux disease without esophagitis: Secondary | ICD-10-CM | POA: Diagnosis not present

## 2018-05-21 DIAGNOSIS — K259 Gastric ulcer, unspecified as acute or chronic, without hemorrhage or perforation: Secondary | ICD-10-CM | POA: Diagnosis not present

## 2018-05-21 LAB — CBC WITH DIFFERENTIAL/PLATELET
Basophils Absolute: 0 10*3/uL (ref 0–0.1)
Basophils Relative: 0 %
Blasts: 71 %
Eosinophils Absolute: 0.7 10*3/uL (ref 0–0.7)
Eosinophils Relative: 6 %
HCT: 32.2 % — ABNORMAL LOW (ref 35.0–47.0)
Hemoglobin: 10.9 g/dL — ABNORMAL LOW (ref 12.0–16.0)
Lymphocytes Relative: 14 %
Lymphs Abs: 1.6 10*3/uL (ref 1.0–3.6)
MCH: 29.4 pg (ref 26.0–34.0)
MCHC: 33.7 g/dL (ref 32.0–36.0)
MCV: 87 fL (ref 80.0–100.0)
Monocytes Absolute: 0.8 10*3/uL (ref 0.2–0.9)
Monocytes Relative: 7 %
Neutro Abs: 0.2 10*3/uL — ABNORMAL LOW (ref 1.4–6.5)
Neutrophils Relative %: 2 %
Platelets: 36 10*3/uL — ABNORMAL LOW (ref 150–440)
RBC: 3.7 MIL/uL — ABNORMAL LOW (ref 3.80–5.20)
RDW: 16.8 % — ABNORMAL HIGH (ref 11.5–14.5)
WBC: 11.4 10*3/uL — ABNORMAL HIGH (ref 4.0–10.5)
nRBC: 105 /100 WBC — ABNORMAL HIGH

## 2018-05-21 LAB — COMPREHENSIVE METABOLIC PANEL
ALT: 17 U/L (ref 0–44)
AST: 34 U/L (ref 15–41)
Albumin: 3.2 g/dL — ABNORMAL LOW (ref 3.5–5.0)
Alkaline Phosphatase: 88 U/L (ref 38–126)
Anion gap: 11 (ref 5–15)
BUN: 13 mg/dL (ref 8–23)
CO2: 26 mmol/L (ref 22–32)
Calcium: 8.6 mg/dL — ABNORMAL LOW (ref 8.9–10.3)
Chloride: 96 mmol/L — ABNORMAL LOW (ref 98–111)
Creatinine, Ser: 0.46 mg/dL (ref 0.44–1.00)
GFR calc Af Amer: >60 mL/min (ref >60)
GFR calc non Af Amer: >60 mL/min (ref >60)
Glucose, Bld: 181 mg/dL — ABNORMAL HIGH (ref 70–99)
Potassium: 3.6 mmol/L (ref 3.5–5.1)
Sodium: 133 mmol/L — ABNORMAL LOW (ref 135–145)
Total Bilirubin: 3.4 mg/dL — ABNORMAL HIGH (ref 0.3–1.2)
Total Protein: 7.6 g/dL (ref 6.5–8.1)

## 2018-05-21 LAB — PROTIME-INR
INR: 0.99
Prothrombin Time: 13 seconds (ref 11.4–15.2)

## 2018-05-21 LAB — FIBRINOGEN: Fibrinogen: 560 mg/dL — ABNORMAL HIGH (ref 210–475)

## 2018-05-21 LAB — APTT: aPTT: 32 seconds (ref 24–36)

## 2018-05-21 LAB — BILIRUBIN, DIRECT: Bilirubin, Direct: 1.1 mg/dL — ABNORMAL HIGH (ref 0.0–0.2)

## 2018-05-21 LAB — SAMPLE TO BLOOD BANK

## 2018-05-21 NOTE — Progress Notes (Signed)
Patient ID: Julie Holland, female   DOB: 06-03-1941, 77 y.o.   MRN: [id number]  Chief Complaint  Patient presents with  . New Patient (Initial Visit)    Venous duplex    HPI Julie Holland is a 77 y.o. female.  I am asked to see the patient by Dr. Mike Gip for evaluation of left arm swelling and discoloration.  She is seen on an urgent basis and is sent from Dr. Kem Parkinson office today to be seen immediately.  The patient has several weeks of unexplained arm swelling and discoloration.  This is quite painful.  No right arm symptoms.  She does have some lower extremity swelling as well.  She is getting treatment for leukemia.  She was in rehabilitation when this started several weeks ago.  Her husband does relate that they were doing a lot of physical therapy using resistive bands and other devices.  She also had an IV in that arm several weeks ago in [place].  She does not have an ipsilateral Port-A-Cath or PermCath.  Nothing is really made it better and the bruising has progressed throughout the lower arm as has the swelling.  No fevers or chills.  To assess her, noninvasive studies were done today.  No DVT was identified in the venous system.  Normal triphasic waveforms were seen in the radial artery and ulnar artery on the left so no arterial insufficiency is present.   Past Medical History:  Diagnosis Date  . Arthritis   . Asthma   . 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  . Cataract   . Chronic kidney disease    stage 1  . Diabetes mellitus 2007   type II  . Dry eye of right side   . GERD (gastroesophageal reflux disease)    OCC  . Glaucoma 2018   RIGHT EYE   . Hemorrhoids   . Hyperlipidemia   . Hypertension     Past Surgical History:  Procedure Laterality Date  . CATARACT EXTRACTION, BILATERAL    . CHOLECYSTECTOMY    . COLONOSCOPY  02/13/13   Dr Bary Castilla  . EPIGASTRIC HERNIA REPAIR N/A 03/20/2015   Procedure: HERNIA REPAIR EPIGASTRIC ADULT;  Surgeon: Robert Bellow, MD;  Location: [facility] ORS;  Service: General;  Laterality: N/A;  . ESOPHAGOGASTRODUODENOSCOPY N/A 02/19/2018   Procedure: ESOPHAGOGASTRODUODENOSCOPY (EGD);  Surgeon: Lin Landsman, MD;  Location: [facility] ENDOSCOPY;  Service: Gastroenterology;  Laterality: N/A;  . HEMORRHOID SURGERY N/A 03/20/2015    FOCAL HIGH-GRADE SQUAMOUS INTRAEPITHELIAL LESION (HSIL, ANAL /HEMORRHOIDECTOMY;   Robert Bellow, MD [facility] ORS;  : General;  Laterality: N/A;  . HERNIA REPAIR  July 2016   Epigastric hernia, primary repair  . INCISION AND DRAINAGE Right 02/11/2018   Procedure: INCISION AND DRAINAGE- RIGHT HAND;  Surgeon: Hessie Knows, MD;  Location: [facility] ORS;  Service: Orthopedics;  Laterality: Right;  . TONSILLECTOMY  age 78  . TOTAL ABDOMINAL HYSTERECTOMY  01/1989  . TUBAL LIGATION    . 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  Case has been presented at the [facility] tumor board. No indication for additional treatment outside of serial exams  Surgeon: Robert Bellow, MD;  Location: [facility] ORS;  Service: General;  Laterality: N/A;    Family History  Problem Relation Age of Onset  . Bladder Cancer Mother   .  Colon cancer Father   . Lung cancer Brother   . Lymphoma Sister   . Breast cancer Neg Hx     Social History Social History   Tobacco Use  . Smoking status: Never Smoker  . Smokeless tobacco: Never Used  . Tobacco comment: smoking cessation materials not required  Substance Use Topics  . Alcohol use: No  . Drug use: No    Allergies  Allergen Reactions  . Hydrocodone Nausea And Vomiting and Other (See Comments)    Vertigo but patient takes hydrocodone/acetaminophen outpatient    Current Outpatient Medications  Medication Sig Dispense Refill  . ACCU-CHEK SMARTVIEW test strip USE AS INSTRUCTED TO CHECK  BLOOD GLUCOSE ONCE DAILY if  desired; LON 99 months; Dx E11.9 100 each 1  . acetaminophen (TYLENOL) 500 MG tablet Take 500-1,000 mg by mouth every 6 (six) hours as needed (for pain).    . ADVAIR DISKUS 100-50 MCG/DOSE AEPB USE 1 PUFF TWO TIMES DAILY (Patient taking differently: daily) 3 each 2  . albuterol (PROVENTIL HFA;VENTOLIN HFA) 108 (90 Base) MCG/ACT inhaler Inhale 2 puffs into the lungs every 6 (six) hours as needed for wheezing or shortness of breath. 1 Inhaler 2  . Carboxymeth-Glycerin-Polysorb (REFRESH OPTIVE ADVANCED OP) Place 1 drop into both eyes 3 (three) times daily as needed (for dry/irritated eyes).    . carvedilol (COREG) 6.25 MG tablet Take 1 tablet (6.25 mg total) by mouth 2 (two) times daily with a meal. 180 tablet 1  . ferrous sulfate 325 (65 FE) MG tablet Take 1 tablet (325 mg total) by mouth daily.    Marland Kitchen latanoprost (XALATAN) 0.005 % ophthalmic solution Place 1 drop at bedtime into the right eye.    Marland Kitchen levofloxacin (LEVAQUIN) 500 MG tablet Take 500 mg by mouth.    Marland Kitchen lisinopril (PRINIVIL,ZESTRIL) 10 MG tablet TAKE 1 TABLET BY MOUTH  DAILY 90 tablet 1  . metFORMIN (GLUCOPHAGE) 850 MG tablet Take 1 tablet (850 mg total) by mouth 2 (two) times daily with a meal. 180 tablet 0  . Multiple Vitamin (MULTIVITAMIN) tablet Take 2 tablets by mouth daily.     . Nutritional Supplements (ESTROVEN PO) Take 1 tablet by mouth daily.    . pantoprazole (PROTONIX) 40 MG tablet Take 1 tablet (40 mg total) by mouth 2 (two) times daily before a meal. 180 tablet 0  . simvastatin (ZOCOR) 20 MG tablet Take 1 tablet (20 mg total) by mouth at bedtime. 90 tablet 0   No current facility-administered medications for this visit.    Facility-Administered Medications Ordered in Other Visits  Medication Dose Route Frequency Provider Last Rate Last Dose  . sodium chloride flush (NS) 0.9 % injection 10 mL  10 mL Intracatheter PRN Corcoran, Melissa C, MD      . sodium chloride flush (NS) 0.9 % injection 3 mL  3 mL Intracatheter PRN  Corcoran, Drue Second, MD          REVIEW OF SYSTEMS (Negative unless checked)  Constitutional: [] Weight loss  [] Fever  [] Chills Cardiac: [] Chest pain   [] Chest pressure   [] Palpitations   [] Shortness of breath when laying flat   [] Shortness of breath at rest   [x] Shortness of breath with exertion. Vascular:  [] Pain in legs with walking   [] Pain in legs at rest   [] Pain in legs when laying flat   [] Claudication   [] Pain in feet when walking  [] Pain in feet at rest  [] Pain in feet when laying flat   [] History  of DVT   [] Phlebitis   [x] Swelling in legs   [] Varicose veins   [] Non-healing ulcers Pulmonary:   [] Uses home oxygen   [] Productive cough   [] Hemoptysis   [] Wheeze  [] COPD   [] Asthma Neurologic:  [] Dizziness  [] Blackouts   [] Seizures   [] History of stroke   [] History of TIA  [] Aphasia   [] Temporary blindness   [] Dysphagia   [] Weakness or numbness in arms   [] Weakness or numbness in legs Musculoskeletal:  [x] Arthritis   [] Joint swelling   [x] Joint pain   [] Low back pain Hematologic:  [x] Easy bruising  [] Easy bleeding   [] Hypercoagulable state   [x] Anemic  [] Hepatitis Gastrointestinal:  [] Blood in stool   [] Vomiting blood  [] Gastroesophageal reflux/heartburn   [] Abdominal pain Genitourinary:  [] Chronic kidney disease   [] Difficult urination  [] Frequent urination  [] Burning with urination   [] Hematuria Skin:  [] Rashes   [] Ulcers   [] Wounds Psychological:  [] History of anxiety   []  History of major depression.    Physical Exam BP (!) 163/83 (BP Location: Right Arm, Patient Position: Sitting)   Pulse (!) 112   Resp 14   Ht 5' 3"  (1.6 m)   Wt 123 lb (55.8 kg)   BMI 21.79 kg/m  Gen:  Thin, NAD. Somewhat debilitated appearing Head: [place]/AT, No temporalis wasting.  Ear/Nose/Throat: Hearing grossly intact, nares w/o erythema or drainage, oropharynx w/o Erythema/Exudate Eyes: Conjunctiva clear, sclera non-icteric  Neck: trachea midline.   Pulmonary:  Good air movement, clear to auscultation  bilaterally.  Cardiac: RRR, no JVD Vascular:  Vessel Right Left  Radial Palpable 1+ Palpable                                   Gastrointestinal: soft, non-tender/non-distended. Musculoskeletal: M/S 5/5 throughout.  Mild lower extremity swelling.  Good capillary refill.  Left upper extremity with 2-3+ swelling from the mid bicep region down to the hand with purplish discoloration of the forearm and hand.  There is capillary refill present. Neurologic: Sensation grossly intact in extremities.  Symmetrical.  Speech is fluent. Motor exam as listed above. Psychiatric: Judgment intact, Mood & affect appropriate for pt's clinical situation. Dermatologic: No rashes or ulcers noted.  No cellulitis or open wounds.    Radiology No results found.  Labs Recent Results (from the past 2160 hour(s))  CBC with Differential/Platelet     Status: Abnormal   Collection Time: 02/26/18  8:44 AM  Result Value Ref Range   WBC 6.1 3.8 - 10.8 Thousand/uL   RBC 3.15 (L) 3.80 - 5.10 Million/uL   Hemoglobin 9.1 (L) 11.7 - 15.5 g/dL   HCT 28.2 (L) 35.0 - 45.0 %   MCV 89.5 80.0 - 100.0 fL   MCH 28.9 27.0 - 33.0 pg   MCHC 32.3 32.0 - 36.0 g/dL   RDW 15.4 (H) 11.0 - 15.0 %   Platelets 139 (L) 140 - 400 Thousand/uL   MPV 13.6 (H) 7.5 - 12.5 fL   Neutro Abs 3,727 1,500 - 7,800 cells/uL   Lymphs Abs 982 850 - 3,900 cells/uL   WBC mixed population 1,238 (H) 200 - 950 cells/uL   Eosinophils Absolute 110 15 - 500 cells/uL   Basophils Absolute 43 0 - 200 cells/uL   Neutrophils Relative % 61.1 %   Total Lymphocyte 16.1 %   Monocytes Relative 20.3 %   Eosinophils Relative 1.8 %   Basophils Relative 0.7 %   Smear Review  Comment: Review of peripheral smear confirms automated results.   Fe+TIBC+Fer     Status: None   Collection Time: 02/26/18  8:44 AM  Result Value Ref Range   Iron 48 45 - 160 mcg/dL   TIBC 295 250 - 450 mcg/dL (calc)   %SAT 16 16 - 45 % (calc)   Ferritin 151 16 - 288 ng/mL  CBC  and differential     Status: Abnormal   Collection Time: 03/08/18 12:00 AM  Result Value Ref Range   Hemoglobin 9.1 (A) 12.0 - 16.0   HCT 30 (A) 36 - 46   Platelets 114 (A) 150 - 399   WBC 4.4   Basic metabolic panel     Status: Abnormal   Collection Time: 03/08/18 12:00 AM  Result Value Ref Range   Glucose 171    BUN 31 (A) 4 - 21   Creatinine 0.6 0.5 - 1.1   Potassium 3.8 3.4 - 5.3   Sodium 132 (A) 137 - 147  CBC     Status: Abnormal   Collection Time: 04/09/18 11:27 AM  Result Value Ref Range   WBC 9.9 3.6 - 11.0 K/uL   RBC 3.29 (L) 3.80 - 5.20 MIL/uL   Hemoglobin 10.0 (L) 12.0 - 16.0 g/dL   HCT 29.6 (L) 35.0 - 47.0 %   MCV 89.9 80.0 - 100.0 fL   MCH 30.3 26.0 - 34.0 pg   MCHC 33.7 32.0 - 36.0 g/dL   RDW 17.4 (H) 11.5 - 14.5 %   Platelets 138 (L) 150 - 440 K/uL    Comment: Performed at [facility], [address] [place], [place] [address]  Ferritin     Status: None   Collection Time: 04/09/18 11:27 AM  Result Value Ref Range   Ferritin 67 11 - 307 ng/mL    Comment: Performed at [facility], [address] [place], [place] [address]  C difficile quick scan w PCR reflex     Status: None   Collection Time: 04/16/18 12:05 PM  Result Value Ref Range   C Diff antigen NEGATIVE NEGATIVE   C Diff toxin NEGATIVE NEGATIVE   C Diff interpretation No C. difficile detected.     Comment: Performed at [facility], [address] [place], [place] [address]  CBC with Differential/Platelet     Status: Abnormal   Collection Time: 04/16/18 12:05 PM  Result Value Ref Range   WBC 19.2 (H) 3.6 - 11.0 K/uL   RBC 2.20 (L) 3.80 - 5.20 MIL/uL   Hemoglobin 6.7 (L) 12.0 - 16.0 g/dL   HCT 20.3 (L) 35.0 - 47.0 %   MCV 92.4 80.0 - 100.0 fL   MCH 30.4 26.0 - 34.0 pg   MCHC 32.9 32.0 - 36.0 g/dL   RDW 18.1 (H) 11.5 - 14.5 %   Platelets 113 (L) 150 - 440 K/uL   Neutrophils Relative % 23 %   Lymphocytes Relative 55 %   Monocytes Relative 7 %   Eosinophils  Relative 6 %   Basophils Relative 4 %   Band Neutrophils 1 %   Metamyelocytes Relative 0 %   Myelocytes 0 %   Promyelocytes Relative 0 %   Blasts 0 %   nRBC 0 0 /100 WBC   Other 4 %   Neutro Abs 4.6 1.4 - 6.5 K/uL   Lymphs Abs 10.6 (H) 1.0 - 3.6 K/uL   Monocytes Absolute 1.3 (H) 0.2 - 0.9 K/uL   Eosinophils  Absolute 1.2 (H) 0 - 0.7 K/uL   Basophils Absolute 0.8 (H) 0 - 0.1 K/uL   RBC Morphology MIXED RBC POPULATION    WBC Morphology ATYPICAL LYMPHOCYTES     Comment: Performed at [facility], [address] [place], [place] [address]  Comprehensive metabolic panel     Status: Abnormal   Collection Time: 04/16/18 12:05 PM  Result Value Ref Range   Sodium 134 (L) 135 - 145 mmol/L   Potassium 3.7 3.5 - 5.1 mmol/L   Chloride 99 98 - 111 mmol/L   CO2 28 22 - 32 mmol/L   Glucose, Bld 125 (H) 70 - 99 mg/dL   BUN 19 8 - 23 mg/dL   Creatinine, Ser 0.60 0.44 - 1.00 mg/dL   Calcium 8.3 (L) 8.9 - 10.3 mg/dL   Total Protein 7.2 6.5 - 8.1 g/dL   Albumin 3.0 (L) 3.5 - 5.0 g/dL   AST 29 15 - 41 U/L   ALT 16 0 - 44 U/L   Alkaline Phosphatase 91 38 - 126 U/L   Total Bilirubin 0.9 0.3 - 1.2 mg/dL   GFR calc non Af Amer >60 >60 mL/min   GFR calc Af Amer >60 >60 mL/min    Comment: (NOTE) The eGFR has been calculated using the CKD EPI equation. This calculation has not been validated in all clinical situations. eGFR's persistently <60 mL/min signify possible Chronic Kidney Disease.    Anion gap 7 5 - 15    Comment: Performed at [facility], [address] [place], [place] [address]  Pathologist smear review     Status: None   Collection Time: 04/16/18 12:05 PM  Result Value Ref Range   Path Review Peripheral blood smear reviewed.     Comment: Patient with new onset leukocytosis, drop in hemoglobin, and decrease in platelet count since 04/09/2018. Blasts are present, approximately 20% with background dysplasia in eosinophils. RBCs with basophilic stippling and  nucleated RBCs. Platelets with variation in size and subset of hypogranular forms. Results call to [facility] in [place] at 2:35PM and Dr. Quentin Cornwall in ED at 2:40PM. Reviewed by Dellia Nims. Reuel Derby, M.D. Performed at [facility], [address] [place], [place] [address]   Troponin I     Status: None   Collection Time: 04/16/18 12:17 PM  Result Value Ref Range   Troponin I <0.03 <0.03 ng/mL    Comment: Performed at [facility], [address] [place], [place] [address]  Type and screen [place]     Status: None   Collection Time: 04/16/18  1:31 PM  Result Value Ref Range   ABO/RH(D) A POS    Antibody Screen NEG    Sample Expiration      04/19/2018 Performed at [facility], [address] [place], [place] [address]   Glucose, capillary     Status: None   Collection Time: 04/16/18  3:06 PM  Result Value Ref Range   Glucose-Capillary 87 70 - 99 mg/dL  Protime-INR     Status: None   Collection Time: 04/16/18  3:29 PM  Result Value Ref Range   Prothrombin Time 14.7 11.4 - 15.2 seconds   INR 1.16     Comment: Performed at [facility], [address] [place], [place] [address]  APTT     Status: None   Collection Time: 04/16/18  3:29 PM  Result Value Ref Range   aPTT 28 24 - 36 seconds  Comment: Performed at [facility], [address] [place], [place] [address]  CBC with Differential     Status: Abnormal   Collection Time: 05/07/18  9:00 AM  Result Value Ref Range   WBC 6.1 4.0 - 10.5 K/uL    Comment: WHITE COUNT CONFIRMED ON SMEAR   RBC 2.30 (L) 3.80 - 5.20 MIL/uL   Hemoglobin 7.4 (L) 12.0 - 16.0 g/dL   HCT 21.8 (L) 35.0 - 47.0 %   MCV 94.9 80.0 - 100.0 fL   MCH 32.0 26.0 - 34.0 pg   MCHC 33.7 32.0 - 36.0 g/dL   RDW 23.8 (H) 11.5 - 14.5 %   Platelets 9 (LL) 150 - 400 K/uL    Comment: RESULT REPEATED AND VERIFIED PLATELET COUNT CONFIRMED BY SMEAR CRITICAL RESULT CALLED TO, READ BACK BY AND  VERIFIED WITH: ANITA BLACK @ 9:35AM 05/07/2018 BY LGR    Neutrophils Relative % 1 %   Lymphocytes Relative 89 %   Monocytes Relative 2 %   Eosinophils Relative 1 %   Basophils Relative 0 %   Blasts 28 %    Comment: RESULT CHANGE CALLED TO DR Mike Gip ON 05/07/2018 AT 16:10 BY DR RUBINAS. CORRECTED ON 08/30 AT 1641: PREVIOUSLY REPORTED AS 7    nRBC 29 (H) 0 /100 WBC   Neutro Abs 0.1 (L) 1.4 - 6.5 K/uL   Lymphs Abs 5.4 (H) 1.0 - 3.6 K/uL   Monocytes Absolute 0.1 (L) 0.2 - 0.9 K/uL   Eosinophils Absolute 0.1 0 - 0.7 K/uL   Basophils Absolute 0.0 0 - 0.1 K/uL   RBC Morphology MIXED RBC POPULATION    WBC Morphology SMUDGE CELLS    Smear Review      CONSISTANT WITH PREVIOUS PATHOLOGIST REVIEW WITH INCREASE IN NRBCS    Comment: PATH REVIEW HAS BEEN ORDERED ON THIS ACCESSION NUMBER Performed at [facility], [address] [place], [place] [address]   Comprehensive metabolic panel     Status: Abnormal   Collection Time: 05/07/18  9:00 AM  Result Value Ref Range   Sodium 132 (L) 135 - 145 mmol/L   Potassium 3.8 3.5 - 5.1 mmol/L   Chloride 97 (L) 98 - 111 mmol/L   CO2 26 22 - 32 mmol/L   Glucose, Bld 223 (H) 70 - 99 mg/dL   BUN 11 8 - 23 mg/dL   Creatinine, Ser 0.50 0.44 - 1.00 mg/dL   Calcium 9.1 8.9 - 10.3 mg/dL   Total Protein 7.7 6.5 - 8.1 g/dL   Albumin 3.4 (L) 3.5 - 5.0 g/dL   AST 32 15 - 41 U/L   ALT 15 0 - 44 U/L   Alkaline Phosphatase 85 38 - 126 U/L   Total Bilirubin 1.7 (H) 0.3 - 1.2 mg/dL   GFR calc non Af Amer >60 >60 mL/min   GFR calc Af Amer >60 >60 mL/min    Comment: (NOTE) The eGFR has been calculated using the CKD EPI equation. This calculation has not been validated in all clinical situations. eGFR's persistently <60 mL/min signify possible Chronic Kidney Disease.    Anion gap 9 5 - 15    Comment: Performed at [facility], [address] [place], [place] [address]  Magnesium     Status: Abnormal   Collection Time: 05/07/18  9:00 AM  Result  Value Ref Range   Magnesium 1.5 (L) 1.7 - 2.4 mg/dL    Comment: Performed at [facility], [place],  [place] [address]  Hold Tube- Blood Bank     Status: None   Collection Time: 05/07/18  9:00 AM  Result Value Ref Range   Blood Bank Specimen SAMPLE AVAILABLE FOR TESTING    Sample Expiration      05/10/2018 Performed at [facility], [address] [place], [place] [address]   Uric acid     Status: None   Collection Time: 05/07/18  9:00 AM  Result Value Ref Range   Uric Acid, Serum 4.0 2.5 - 7.1 mg/dL    Comment: Performed at [facility], [address] [place], [place] [address]  Pathologist smear review     Status: None   Collection Time: 05/07/18  9:00 AM  Result Value Ref Range   Path Review Peripheral blood smear is reviewed.     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 28% blasts. Rare neutrophil. Thrombocytopenia. Nucleated RBCs with dyserythropoiesis. Findings called to Dr. Mike Gip on 05/07/2018 at 4:10PM. Reviewed by Dellia Nims. Reuel Derby, M.D. Performed at [facility], [address] [place], [place] [address]   Type and screen     Status: None   Collection Time: 05/07/18  9:00 AM  Result Value Ref Range   ABO/RH(D) A POS    Antibody Screen NEG    Sample Expiration 05/10/2018    Unit Number [id number]    Blood Component Type RBC, LR IRR    Unit division 00    Status of Unit ISSUED,FINAL    Transfusion Status OK TO TRANSFUSE    Crossmatch Result      Compatible Performed at [facility], [address], [place], [place] [address]    Unit Number [id number]    Blood Component Type RBC, LR IRR    Unit division 00    Status of Unit ISSUED,FINAL    Transfusion Status OK TO TRANSFUSE    Crossmatch Result Compatible   BPAM RBC     Status: None   Collection Time: 05/07/18  9:00 AM  Result Value Ref Range   ISSUE DATE / TIME 488891694503    Blood Product Unit  Number [id number]    PRODUCT CODE X5056P79    Unit Type and Rh 0600    Blood Product Expiration Date 480165537482    ISSUE DATE / TIME 707867544920    Blood Product Unit Number [id number]    PRODUCT CODE G5498Y64    Unit Type and Rh 1583    Blood Product Expiration Date 094076808811   Prepare Pheresed Platelets     Status: None   Collection Time: 05/07/18 11:00 AM  Result Value Ref Range   Unit Number [id number]    Blood Component Type PLTPH LI3 PAS    Unit division 00    Status of Unit ISSUED,FINAL    Transfusion Status      OK TO TRANSFUSE Performed at [facility], [address] [place], [place] [address]   Prepare RBC     Status: None   Collection Time: 05/07/18 11:00 AM  Result Value Ref Range   Order Confirmation      ORDER PROCESSED BY BLOOD BANK Performed at [facility], [address], [place], [place] [address]   BPAM Platelet Pheresis     Status: None   Collection Time: 05/07/18 11:00 AM  Result Value Ref Range   ISSUE DATE / TIME [id number]    Blood Product Unit Number [id number]    PRODUCT CODE  N4627O35    Unit Type and Rh 6200    Blood Product Expiration Date 009381829937   Prepare Pheresed Platelets     Status: None   Collection Time: 05/12/18  6:30 AM  Result Value Ref Range   Unit Number [id number]    Blood Component Type PLTPH LI2 PAS    Unit division 00    Status of Unit ISSUED,FINAL    Transfusion Status      OK TO TRANSFUSE Performed at [facility], [address] [place], [place] [address]   BPAM Platelet Pheresis     Status: None   Collection Time: 05/12/18  6:30 AM  Result Value Ref Range   ISSUE DATE / TIME 852778242353    Blood Product Unit Number [id number]    PRODUCT CODE E7007V00    Unit Type and Rh 5100    Blood Product Expiration Date 619509326712   Type and screen [place]     Status: None   Collection Time: 05/12/18  9:30 AM  Result Value Ref Range    ABO/RH(D) A POS    Antibody Screen NEG    Sample Expiration 05/15/2018    Unit Number [id number]    Blood Component Type RBC, LR IRR    Unit division 00    Status of Unit ISSUED,FINAL    Transfusion Status OK TO TRANSFUSE    Crossmatch Result      Compatible Performed at [facility], [address] [place], [place] [address]   CBC with Differential/Platelet     Status: Abnormal   Collection Time: 05/12/18  9:30 AM  Result Value Ref Range   WBC 9.4 3.6 - 11.0 K/uL   RBC 2.33 (L) 3.80 - 5.20 MIL/uL   Hemoglobin 7.4 (L) 12.0 - 16.0 g/dL   HCT 22.0 (L) 35.0 - 47.0 %   MCV 94.3 80.0 - 100.0 fL   MCH 31.8 26.0 - 34.0 pg   MCHC 33.7 32.0 - 36.0 g/dL   RDW 24.8 (H) 11.5 - 14.5 %   Platelets 16 (LL) 150 - 440 K/uL    Comment: RESULT REPEATED AND VERIFIED PLATELET COUNT CONFIRMED BY SMEAR CRITICAL RESULT CALLED TO, READ BACK BY AND VERIFIED WITH: NICOLE KINNEY AT 1026 ON 05/12/18 [place]    Neutrophils Relative % 2 %   Lymphocytes Relative 22 %   Monocytes Relative 12 %   Eosinophils Relative 4 %   Basophils Relative 0 %   Band Neutrophils 0 %   Metamyelocytes Relative 0 %   Myelocytes 0 %   Promyelocytes Relative 0 %   Blasts 60 %   nRBC 138 (H) 0 /100 WBC   Other 0 %   Neutro Abs 0.2 (L) 1.4 - 6.5 K/uL   Lymphs Abs 2.1 1.0 - 3.6 K/uL   Monocytes Absolute 1.1 (H) 0.2 - 0.9 K/uL   Eosinophils Absolute 0.4 0 - 0.7 K/uL   Basophils Absolute 0.0 0 - 0.1 K/uL   RBC Morphology MIXED RBC POPULATION     Comment: POLYCHROMASIA PRESENT Performed at [facility], [address] [place], [place] [address]   BPAM RBC     Status: None   Collection Time: 05/12/18  9:30 AM  Result Value Ref Range   ISSUE DATE / TIME [id number]    Blood Product Unit Number [id number]    PRODUCT CODE E0332V00    Unit Type and Rh 6200    Blood Product Expiration Date 222979892119   Prepare RBC  Status: None   Collection Time: 05/12/18 11:30 AM  Result Value Ref Range    Order Confirmation      ORDER PROCESSED BY BLOOD BANK Performed at [facility], [address] [place], [place] [address]   Platelet count     Status: Abnormal   Collection Time: 05/12/18 12:32 PM  Result Value Ref Range   Platelets 42 (L) 150 - 440 K/uL    Comment: Performed at [facility], [address] [place], [place] [address]  Hold Tube- Blood Bank     Status: None   Collection Time: 05/14/18  8:17 AM  Result Value Ref Range   Blood Bank Specimen      SPECIMEN HEMOLYZED. HEMOLYSIS MAY AFFECT INTEGRITY OF RESULTS. C/DONI SAUNDERS 05/14/18 0945 [facility]L   Sample Expiration      05/17/2018 Performed at [facility], [address] [place], [place] [address]   Magnesium     Status: Abnormal   Collection Time: 05/14/18  8:18 AM  Result Value Ref Range   Magnesium 1.4 (L) 1.7 - 2.4 mg/dL    Comment: Performed at [facility] [facility], [address] [place], [place] [address]  Comprehensive metabolic panel     Status: Abnormal   Collection Time: 05/14/18  8:18 AM  Result Value Ref Range   Sodium 130 (L) 135 - 145 mmol/L   Potassium 3.7 3.5 - 5.1 mmol/L   Chloride 94 (L) 98 - 111 mmol/L   CO2 26 22 - 32 mmol/L   Glucose, Bld 175 (H) 70 - 99 mg/dL   BUN 17 8 - 23 mg/dL   Creatinine, Ser 0.59 0.44 - 1.00 mg/dL   Calcium 8.5 (L) 8.9 - 10.3 mg/dL   Total Protein 7.0 6.5 - 8.1 g/dL   Albumin 3.2 (L) 3.5 - 5.0 g/dL   AST 28 15 - 41 U/L   ALT 13 0 - 44 U/L   Alkaline Phosphatase 65 38 - 126 U/L   Total Bilirubin 2.8 (H) 0.3 - 1.2 mg/dL   GFR calc non Af Amer >60 >60 mL/min   GFR calc Af Amer >60 >60 mL/min    Comment: (NOTE) The eGFR has been calculated using the CKD EPI equation. This calculation has not been validated in all clinical situations. eGFR's persistently <60 mL/min signify possible Chronic Kidney Disease.    Anion gap 10 5 - 15    Comment: Performed at [facility], [place], [place] [address]  CBC with  Differential     Status: Abnormal   Collection Time: 05/14/18  8:18 AM  Result Value Ref Range   WBC 12.7 (H) 4.0 - 10.5 K/uL    Comment: ADJUSTED FOR NUCLEATED RBC'S   RBC 2.14 (L) 3.80 - 5.20 MIL/uL   Hemoglobin 6.6 (L) 12.0 - 16.0 g/dL   HCT 19.9 (L) 35.0 - 47.0 %   MCV 93.2 80.0 - 100.0 fL   MCH 31.0 26.0 - 34.0 pg   MCHC 33.2 32.0 - 36.0 g/dL   RDW 20.5 (H) 11.5 - 14.5 %   Platelets 31 (L) 150 - 440 K/uL   Neutrophils Relative % 3 %   Lymphocytes Relative 21 %   Monocytes Relative 5 %   Eosinophils Relative 6 %   Basophils Relative 0 %   Band Neutrophils 0 %   Metamyelocytes Relative 0 %   Myelocytes 0 %   Promyelocytes Relative 0 %   Blasts 65 %   nRBC  78 (H) 0 /100 WBC   Other 0 %   RBC Morphology HYPOCHROMIA     Comment: MIXED RBC POPULATION POLYCHROMASIA PRESENT BASOPHILIC STIPPLING    WBC Morphology SMUDGE CELLS    Smear Review PLATELETS APPEAR DECREASED    Neutro Abs 0.4 (L) 1.4 - 6.5 K/uL   Lymphs Abs 2.7 1.0 - 3.6 K/uL   Monocytes Absolute 0.6 0.2 - 0.9 K/uL   Eosinophils Absolute 0.8 (H) 0 - 0.7 K/uL   Basophils Absolute 0.0 0 - 0.1 K/uL    Comment: Performed at [facility], [address] [place], [place] [address]  Bilirubin, direct     Status: Abnormal   Collection Time: 05/14/18  8:38 AM  Result Value Ref Range   Bilirubin, Direct 0.7 (H) 0.0 - 0.2 mg/dL    Comment: Performed at [facility], [address], [place], [place] [address]  Pathologist smear review     Status: None   Collection Time: 05/14/18  8:38 AM  Result Value Ref Range   Path Review Peripheral blood smear is reviewed.     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 65% blasts on current smear. Normocytic anemia with numerous nucleated RBCs. Eosinophils and neutrophils with dysplasia. Thrombocytopenia with hypocellularity. Findings called to Hassan Rowan in the [facility] on 05/14/2018 at 10:55AM. Reviewed by Dellia Nims. Reuel Derby, M.D. Performed at  [facility], [address] [place], [place] [address]   Type and screen     Status: None   Collection Time: 05/14/18 10:32 AM  Result Value Ref Range   ABO/RH(D) A POS    Antibody Screen NEG    Sample Expiration 05/17/2018    Unit Number [id number]    Blood Component Type RBC, LR IRR    Unit division 00    Status of Unit ISSUED,FINAL    Unit tag comment IRRADIATED PRODUCT    Transfusion Status OK TO TRANSFUSE    Crossmatch Result Compatible    Unit Number [id number]    Blood Component Type RBC, LR IRR    Unit division 00    Status of Unit ISSUED,FINAL    Unit tag comment IRRADIATED PRODUCT    Transfusion Status OK TO TRANSFUSE    Crossmatch Result      Compatible Performed at [facility], [address] [place], [place] [address]   Hold Tube- Blood Bank     Status: None   Collection Time: 05/14/18 10:32 AM  Result Value Ref Range   Blood Bank Specimen DUPLICATE    Sample Expiration      05/17/2018 Performed at [facility], [address] [place], [place] [address]   BPAM RBC     Status: None   Collection Time: 05/14/18 10:32 AM  Result Value Ref Range   ISSUE DATE / TIME 629476546503    Blood Product Unit Number [id number]    PRODUCT CODE [id number]    Unit Type and Rh 9500    Blood Product Expiration Date 675916384665    ISSUE DATE / TIME 993570177939    Blood Product Unit Number [id number]    PRODUCT CODE [id number]    Unit Type and Rh 6200    Blood Product Expiration Date 625638937342   Prepare RBC     Status: None   Collection Time: 05/14/18 10:33 AM  Result Value Ref Range   Order Confirmation      ORDER PROCESSED BY BLOOD BANK Performed at [facility], [address] [place], [place] [address]   Hold Tube- Blood Bank     Status: None   Collection Time: 05/17/18  8:03 AM  Result Value Ref Range   Blood Bank Specimen SAMPLE AVAILABLE FOR TESTING    Sample Expiration      05/20/2018 Performed at  [facility], [address] [place], [place] [address]   Type and screen     Status: None   Collection Time: 05/17/18  8:03 AM  Result Value Ref Range   ABO/RH(D) A POS    Antibody Screen NEG    Sample Expiration 05/20/2018    Unit Number [id number]    Blood Component Type RBC, LR IRR    Unit division 00    Status of Unit ISSUED,FINAL    Unit tag comment IRRADIATED PRODUCT    Transfusion Status OK TO TRANSFUSE    Crossmatch Result Compatible    Unit Number [id number]    Blood Component Type RBC, LR IRR    Unit division 00    Status of Unit ISSUED,FINAL    Unit tag comment IRRADIATED PRODUCT    Transfusion Status OK TO TRANSFUSE    Crossmatch Result Compatible    Unit Number [id number]    Blood Component Type RBC, LR IRR    Unit division 00    Status of Unit ISSUED,FINAL    Unit tag comment IRRADIATED PRODUCT    Transfusion Status OK TO TRANSFUSE    Crossmatch Result Compatible    Unit Number [id number]    Blood Component Type RBC, LR IRR    Unit division 00    Status of Unit ISSUED,FINAL    Unit tag comment IRRADIATED PRODUCT    Transfusion Status OK TO TRANSFUSE    Crossmatch Result      Compatible Performed at [facility], [address] [place], [place] [address]   BPAM RBC     Status: None   Collection Time: 05/17/18  8:03 AM  Result Value Ref Range   ISSUE DATE / TIME 932671245809    Blood Product Unit Number [id number]    PRODUCT CODE [id number]    Unit Type and Rh 5100    Blood Product Expiration Date 024097353299    ISSUE DATE / TIME 242683419622    Blood Product Unit Number [id number]    PRODUCT CODE [id number]    Unit Type and Rh 0600    Blood Product Expiration Date 856314970263    ISSUE DATE / TIME 785885027741    Blood Product Unit Number [id number]    PRODUCT CODE S9628Z66    Unit Type and Rh 6200    Blood Product Expiration Date 294765465035    ISSUE DATE / TIME 465681275170    Blood Product Unit Number  [id number]    PRODUCT CODE [id number]    Unit Type and Rh 6200    Blood Product Expiration Date 935701779390   CBC with Differential     Status: Abnormal   Collection Time: 05/17/18  8:22 AM  Result Value Ref Range   WBC 24.6 (H) 4.0 - 10.5 K/uL    Comment: ADJUSTED FOR NUCLEATED RBC'S   RBC 1.89 (L) 3.80 - 5.20 MIL/uL   Hemoglobin 5.7 (L) 12.0 - 16.0 g/dL   HCT 17.0 (L) 35.0 - 47.0 %   MCV 89.7 80.0 - 100.0 fL   MCH 30.1 26.0 - 34.0 pg   MCHC 33.5 32.0 - 36.0 g/dL   RDW 19.5 (H) 11.5 - 14.5 %  Platelets 25 (LL) 150 - 400 K/uL    Comment: RESULT REPEATED AND VERIFIED SKYPED ANITA BLACK AT 08:35 05/17/2018 K[facility]    Neutrophils Relative % 4 %   Lymphocytes Relative 22 %   Monocytes Relative 6 %   Eosinophils Relative 4 %   Basophils Relative 0 %   Band Neutrophils 0 %   Metamyelocytes Relative 0 %   Myelocytes 0 %   Promyelocytes Relative 0 %   Blasts 64 %   nRBC 56 (H) 0 /100 WBC   Other 0 %   Neutro Abs 1.0 (L) 1.4 - 6.5 K/uL   Lymphs Abs 5.4 (H) 1.0 - 3.6 K/uL   Monocytes Absolute 1.5 (H) 0.2 - 0.9 K/uL   Eosinophils Absolute 1.0 (H) 0 - 0.7 K/uL   Basophils Absolute 0.0 0 - 0.1 K/uL   RBC Morphology HYPOCHROMIA     Comment: MIXED RBC POPULATION POLYCHROMASIA PRESENT BASOPHILIC STIPPLING    WBC Morphology SMUDGE CELLS    Smear Review PLATELETS VARY IN SIZE     Comment: PLATELETS APPEAR DECREASED Performed at [facility], [address] [place], [place] [address]   Prepare RBC     Status: None   Collection Time: 05/17/18  9:17 AM  Result Value Ref Range   Order Confirmation      ORDER PROCESSED BY BLOOD BANK Performed at [facility], [address] [place], [place] [address]   CBC with Differential     Status: Abnormal   Collection Time: 05/19/18  8:15 AM  Result Value Ref Range   WBC 20.7 (H) 4.0 - 10.5 K/uL    Comment: ADJUSTED FOR NUCLEATED RBC'S   RBC 2.42 (L) 3.80 - 5.20 MIL/uL   Hemoglobin 7.2 (L) 12.0 - 16.0 g/dL    Comment:  DELTA CHECK NOTED   HCT 21.9 (L) 35.0 - 47.0 %   MCV 90.5 80.0 - 100.0 fL   MCH 30.0 26.0 - 34.0 pg   MCHC 33.1 32.0 - 36.0 g/dL   RDW 17.3 (H) 11.5 - 14.5 %   Platelets 28 (LL) 150 - 400 K/uL    Comment: RESULT REPEATED AND VERIFIED PLATELET COUNT CONFIRMED BY SMEAR CRITICAL RESULT CALLED TO, READ BACK BY AND VERIFIED WITH: BRYAN GRAY @ 8:37AM 05/19/2018 BY LGR PLATELETS VARY IN SIZE    Neutrophils Relative % 2 %   Lymphocytes Relative 13 %   Monocytes Relative 4 %   Eosinophils Relative 8 %   Basophils Relative 0 %   Blasts 73 %   nRBC 78 (H) 0 /100 WBC   Neutro Abs 0.4 (L) 1.4 - 6.5 K/uL   Lymphs Abs 2.7 1.0 - 3.6 K/uL   Monocytes Absolute 0.8 0.2 - 0.9 K/uL   Eosinophils Absolute 1.7 (H) 0 - 0.7 K/uL   Basophils Absolute 0.0 0 - 0.1 K/uL   RBC Morphology BASOPHILIC STIPPLING     Comment: MIXED RBC POPULATION   Smear Review PLATELET COUNT CONFIRMED BY SMEAR     Comment: PLATELETS APPEAR DECREASED Performed at [facility], [address] [place], [place] [address]   Prepare RBC     Status: None   Collection Time: 05/19/18  9:04 AM  Result Value Ref Range   Order Confirmation      ORDER PROCESSED BY BLOOD BANK Performed at [facility], [address], [place], [place] [address]   Hold Tube- Blood Bank     Status: None   Collection Time: 05/21/18  8:02 AM  Result Value Ref Range   Blood Bank Specimen SAMPLE AVAILABLE FOR TESTING    Sample Expiration      05/24/2018 Performed at [facility], [address] [place], [place] [address]   Comprehensive metabolic panel     Status: Abnormal   Collection Time: 05/21/18  8:16 AM  Result Value Ref Range   Sodium 133 (L) 135 - 145 mmol/L   Potassium 3.6 3.5 - 5.1 mmol/L   Chloride 96 (L) 98 - 111 mmol/L   CO2 26 22 - 32 mmol/L   Glucose, Bld 181 (H) 70 - 99 mg/dL   BUN 13 8 - 23 mg/dL   Creatinine, Ser 0.46 0.44 - 1.00 mg/dL   Calcium 8.6 (L) 8.9 - 10.3 mg/dL   Total Protein 7.6 6.5 - 8.1  g/dL   Albumin 3.2 (L) 3.5 - 5.0 g/dL   AST 34 15 - 41 U/L   ALT 17 0 - 44 U/L   Alkaline Phosphatase 88 38 - 126 U/L   Total Bilirubin 3.4 (H) 0.3 - 1.2 mg/dL   GFR calc non Af Amer >60 >60 mL/min   GFR calc Af Amer >60 >60 mL/min    Comment: (NOTE) The eGFR has been calculated using the CKD EPI equation. This calculation has not been validated in all clinical situations. eGFR's persistently <60 mL/min signify possible Chronic Kidney Disease.    Anion gap 11 5 - 15    Comment: Performed at [facility], [address] [place], [place] [address]  CBC with Differential     Status: Abnormal   Collection Time: 05/21/18  8:16 AM  Result Value Ref Range   WBC 11.4 (H) 4.0 - 10.5 K/uL    Comment: ADJUSTED FOR NUCLEATED RBC'S   RBC 3.70 (L) 3.80 - 5.20 MIL/uL   Hemoglobin 10.9 (L) 12.0 - 16.0 g/dL   HCT 32.2 (L) 35.0 - 47.0 %   MCV 87.0 80.0 - 100.0 fL   MCH 29.4 26.0 - 34.0 pg   MCHC 33.7 32.0 - 36.0 g/dL   RDW 16.8 (H) 11.5 - 14.5 %   Platelets 36 (L) 150 - 440 K/uL   Neutrophils Relative % 2 %   Lymphocytes Relative 14 %   Monocytes Relative 7 %   Eosinophils Relative 6 %   Basophils Relative 0 %   Blasts 71 %   nRBC 105 (H) 0 /100 WBC   Neutro Abs 0.2 (L) 1.4 - 6.5 K/uL   Lymphs Abs 1.6 1.0 - 3.6 K/uL   Monocytes Absolute 0.8 0.2 - 0.9 K/uL   Eosinophils Absolute 0.7 0 - 0.7 K/uL   Basophils Absolute 0.0 0 - 0.1 K/uL   RBC Morphology POLYCHROMASIA PRESENT     Comment: MIXED RBC POPULATION CORRECTED ON 09/13 AT 0902: PREVIOUSLY REPORTED AS POLYCHROMASIA PRESENT RARE NRBCs    WBC Morphology SMUDGE CELLS    Smear Review PLATELETS VARY IN SIZE     Comment:   , INCREASE IN NUMBER OF NRBCS NOTED  Performed at [facility], [address] [place], [place] [address]   Bilirubin, direct     Status: Abnormal   Collection Time: 05/21/18  8:16 AM  Result Value Ref Range   Bilirubin, Direct 1.1 (H) 0.0 - 0.2 mg/dL    Comment: Performed at [facility], [address] [place], [place] [address]  Fibrinogen     Status: Abnormal   Collection Time: 05/21/18 10:20 AM  Result  Value Ref Range   Fibrinogen 560 (H) 210 - 475 mg/dL    Comment: Performed at [facility], [address] [place], [place] [address]  APTT     Status: None   Collection Time: 05/21/18 10:20 AM  Result Value Ref Range   aPTT 32 24 - 36 seconds    Comment: Performed at [facility], [address] [place], [place] [address]  Protime-INR     Status: None   Collection Time: 05/21/18 10:20 AM  Result Value Ref Range   Prothrombin Time 13.0 11.4 - 15.2 seconds   INR 0.99     Comment: Performed at [facility], [address] [place], [place] [address]    Assessment/Plan:  Hyperlipidemia lipid control important in reducing the progression of atherosclerotic disease. Continue statin therapy   Diabetes mellitus, controlled blood glucose control important in reducing the progression of atherosclerotic disease. Also, involved in wound healing. On appropriate medications.   Hypertension blood pressure control important in reducing the progression of atherosclerotic disease. On appropriate oral medications.   Acute myeloid leukemia not having achieved remission ([place]) Follows with oncology and saw them today.  With her thrombocytopenia she may have developed significant bruising from a minor trauma to her left arm that has been very slow to heal.  Thrombocytopenia (HCC) Increase her bruising  Left arm swelling The patient has several weeks of unexplained arm swelling and discoloration.  To assess her, noninvasive studies were done today.  No DVT was identified in the venous system.  Normal triphasic waveforms were seen in the radial artery and ulnar artery on the left so no arterial insufficiency is present.  Her husband relates this to starting after physical therapy while she was at rehabilitation.  I think it is highly likely that she  developed some sort of muscle pull or tear due to her deconditioned state and with her thrombocytopenia developed marked muscular and subcutaneous bruising which has been tracking down the arm towards her hand.  I have recommended that she elevate her arm and use warm compresses as needed.  I recommended a compression sleeve to try to help with the swelling and provide pressure and avoid more bruising.  She does not have an acute vascular problem that will require any immediate intervention which is encouraging.  I have discussed the pathophysiology and natural history of both arterial and venous disease with she and her husband today.  I will see her back as needed.      Leotis Pain 05/21/2018, 12:21 PM   This note was created with Dragon medical transcription system.  Any errors from dictation are unintentional.

## 2018-05-21 NOTE — Assessment & Plan Note (Signed)
Follows with oncology and saw them today.  With her thrombocytopenia she may have developed significant bruising from a minor trauma to her left arm that has been very slow to heal.

## 2018-05-21 NOTE — Assessment & Plan Note (Signed)
Increase her bruising

## 2018-05-21 NOTE — Assessment & Plan Note (Signed)
lipid control important in reducing the progression of atherosclerotic disease. Continue statin therapy  

## 2018-05-21 NOTE — Patient Instructions (Signed)
Edema Edema is an abnormal buildup of fluids in your bodytissues. Edema is somewhatdependent on gravity to pull the fluid to the lowest place in your body. That makes the condition more common in the legs and thighs (lower extremities). Painless swelling of the feet and ankles is common and becomes more likely as you get older. It is also common in looser tissues, like around your eyes. When the affected area is squeezed, the fluid may move out of that spot and leave a dent for a few moments. This dent is called pitting. What are the causes? There are many possible causes of edema. Eating too much salt and being on your feet or sitting for a long time can cause edema in your legs and ankles. Hot weather may make edema worse. Common medical causes of edema include:  Heart failure.  Liver disease.  Kidney disease.  Weak blood vessels in your legs.  Cancer.  An injury.  Pregnancy.  Some medications.  Obesity.  What are the signs or symptoms? Edema is usually painless.Your skin may look swollen or shiny. How is this diagnosed? Your health care provider may be able to diagnose edema by asking about your medical history and doing a physical exam. You may need to have tests such as X-rays, an electrocardiogram, or blood tests to check for medical conditions that may cause edema. How is this treated? Edema treatment depends on the cause. If you have heart, liver, or kidney disease, you need the treatment appropriate for these conditions. General treatment may include:  Elevation of the affected body part above the level of your heart.  Compression of the affected body part. Pressure from elastic bandages or support stockings squeezes the tissues and forces fluid back into the blood vessels. This keeps fluid from entering the tissues.  Restriction of fluid and salt intake.  Use of a water pill (diuretic). These medications are appropriate only for some types of edema. They pull fluid  out of your body and make you urinate more often. This gets rid of fluid and reduces swelling, but diuretics can have side effects. Only use diuretics as directed by your health care provider.  Follow these instructions at home:  Keep the affected body part above the level of your heart when you are lying down.  Do not sit still or stand for prolonged periods.  Do not put anything directly under your knees when lying down.  Do not wear constricting clothing or garters on your upper legs.  Exercise your legs to work the fluid back into your blood vessels. This may help the swelling go down.  Wear elastic bandages or support stockings to reduce ankle swelling as directed by your health care provider.  Eat a low-salt diet to reduce fluid if your health care provider recommends it.  Only take medicines as directed by your health care provider. Contact a health care provider if:  Your edema is not responding to treatment.  You have heart, liver, or kidney disease and notice symptoms of edema.  You have edema in your legs that does not improve after elevating them.  You have sudden and unexplained weight gain. Get help right away if:  You develop shortness of breath or chest pain.  You cannot breathe when you lie down.  You develop pain, redness, or warmth in the swollen areas.  You have heart, liver, or kidney disease and suddenly get edema.  You have a fever and your symptoms suddenly get worse. This information is   not intended to replace advice given to you by your health care provider. Make sure you discuss any questions you have with your health care provider. Document Released: 08/25/2005 Document Revised: 01/31/2016 Document Reviewed: 06/17/2013 Elsevier Interactive Patient Education  2017 [employer]  

## 2018-05-21 NOTE — Progress Notes (Signed)
Julie Holland is a 77 y.o. 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  HPI:  The patient was last seen in the hematology clinic on 05/14/2018.  At that time, she remained fatigued.  She denied any bleeding.  She had significant bruising of her left forearm and sacral area.  She had a mild hyperbilirubinemia (2.8; direct fraction 0.7).    CBC has been followed: 05/14/2018:  Hematocrit 19.9, hemoglobin 6.6, MCV 93.2, platelets 31,000, WBC 12,700 with an ANC of 400.  Blasts were 65%. 05/17/2018:  Hematocrit 17.0, hemoglobin 5.7, MCV 89.7, platelets 25,000, WBC 24,600 with an ANC of 1000.  Blasts were 56%. 05/19/2018:  Hematocrit 21.9, hemoglobin 7.2, MCV 90.5, platelets 28,000, WBC 20,700 with an ANC of 400.  Blasts were 73%.  During the interim, patient presents with pain, swelling, and bruising to her LEFT upper and lower extremities. She denies trauma.  Extremities are described as "sore".  Left forearm was bruised last week.  Patient denies that she has experienced any B symptoms. She denies any interval infections. She denies fever.  Patient on Venetoclax at home. She began an "antifungal" medication this morning. She has a jaundiced appearance in clinic.   Patient advises that she maintains an adequate appetite. She is eating well. Weight today is 122 lb 5 oz (55.5 kg), which compared to her last visit to the clinic, represents a 7 pound increase.   Patient denies pain in the clinic today.   Past Medical History:  Diagnosis Date  . Arthritis   . Asthma   . 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  . Cataract   . Chronic kidney disease    stage 1  . Diabetes mellitus 2007   type II  . Dry eye of right side   . GERD (gastroesophageal reflux disease)    OCC  .  Glaucoma 2018   RIGHT EYE   . Hemorrhoids   . Hyperlipidemia   . Hypertension     Past Surgical History:  Procedure Laterality Date  . CATARACT EXTRACTION, BILATERAL    . CHOLECYSTECTOMY    . COLONOSCOPY  02/13/13   Dr Bary Castilla  . EPIGASTRIC HERNIA REPAIR N/A 03/20/2015   Procedure: HERNIA REPAIR EPIGASTRIC ADULT;  Surgeon: Robert Bellow, MD;  Location: [facility] ORS;  Service: General;  Laterality: N/A;  . ESOPHAGOGASTRODUODENOSCOPY N/A 02/19/2018   Procedure: ESOPHAGOGASTRODUODENOSCOPY (EGD);  Surgeon: Lin Landsman, MD;  Location: [facility] ENDOSCOPY;  Service: Gastroenterology;  Laterality: N/A;  . HEMORRHOID SURGERY N/A 03/20/2015    FOCAL HIGH-GRADE SQUAMOUS INTRAEPITHELIAL LESION (HSIL, ANAL /HEMORRHOIDECTOMY;   Robert Bellow, MD [facility] ORS;  : General;  Laterality: N/A;  . HERNIA REPAIR  July 2016   Epigastric hernia, primary repair  . INCISION AND DRAINAGE Right 02/11/2018   Procedure: INCISION AND DRAINAGE- RIGHT HAND;  Surgeon: Hessie Knows, MD;  Location: [facility] ORS;  Service: Orthopedics;  Laterality: Right;  . TONSILLECTOMY  age 66  . TOTAL ABDOMINAL HYSTERECTOMY  01/1989  . TUBAL LIGATION    . 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  Case has been presented at the [facility] tumor board. No indication for additional treatment outside of serial exams  Surgeon: Robert Bellow,  MD;  Location: [facility] ORS;  Service: General;  Laterality: N/A;    Family History  Problem Relation Age of Onset  . Bladder Cancer Mother   . Colon cancer Father   . Lung cancer Brother   . Lymphoma Sister   . Breast cancer Neg Hx     Social History:  reports that she has never smoked. She has never used smokeless tobacco. She reports that she does not drink alcohol or use drugs.  She lives in [place] with her husband.  She denies any exposure to radiations or toxins.  Her husband's name is [name].  The patient is  accompanied by her husband today.  Allergies:  Allergies  Allergen Reactions  . Hydrocodone Nausea And Vomiting and Other (See Comments)    Vertigo but patient takes hydrocodone/acetaminophen outpatient    Current Medications: Current Outpatient Medications  Medication Sig Dispense Refill  . ACCU-CHEK SMARTVIEW test strip USE AS INSTRUCTED TO CHECK  BLOOD GLUCOSE ONCE DAILY if desired; LON 99 months; Dx E11.9 100 each 1  . acetaminophen (TYLENOL) 500 MG tablet Take 500-1,000 mg by mouth every 6 (six) hours as needed (for pain).    . ADVAIR DISKUS 100-50 MCG/DOSE AEPB USE 1 PUFF TWO TIMES DAILY (Patient taking differently: daily) 3 each 2  . albuterol (PROVENTIL HFA;VENTOLIN HFA) 108 (90 Base) MCG/ACT inhaler Inhale 2 puffs into the lungs every 6 (six) hours as needed for wheezing or shortness of breath. 1 Inhaler 2  . Carboxymeth-Glycerin-Polysorb (REFRESH OPTIVE ADVANCED OP) Place 1 drop into both eyes 3 (three) times daily as needed (for dry/irritated eyes).    . carvedilol (COREG) 6.25 MG tablet Take 1 tablet (6.25 mg total) by mouth 2 (two) times daily with a meal. 180 tablet 1  . ferrous sulfate 325 (65 FE) MG tablet Take 1 tablet (325 mg total) by mouth daily.    Marland Kitchen latanoprost (XALATAN) 0.005 % ophthalmic solution Place 1 drop at bedtime into the right eye.    Marland Kitchen levofloxacin (LEVAQUIN) 500 MG tablet Take 500 mg by mouth.    Marland Kitchen lisinopril (PRINIVIL,ZESTRIL) 10 MG tablet TAKE 1 TABLET BY MOUTH  DAILY 90 tablet 1  . metFORMIN (GLUCOPHAGE) 850 MG tablet Take 1 tablet (850 mg total) by mouth 2 (two) times daily with a meal. 180 tablet 0  . Multiple Vitamin (MULTIVITAMIN) tablet Take 2 tablets by mouth daily.     . Nutritional Supplements (ESTROVEN PO) Take 1 tablet by mouth daily.    . pantoprazole (PROTONIX) 40 MG tablet Take 1 tablet (40 mg total) by mouth 2 (two) times daily before a meal. 180 tablet 0  . simvastatin (ZOCOR) 20 MG tablet Take 1 tablet (20 mg total) by mouth at bedtime.  90 tablet 0   No current facility-administered medications for this visit.    Facility-Administered Medications Ordered in Other Visits  Medication Dose Route Frequency Provider Last Rate Last Dose  . sodium chloride flush (NS) 0.9 % injection 10 mL  10 mL Intracatheter PRN Corcoran, Melissa C, MD      . sodium chloride flush (NS) 0.9 % injection 3 mL  3 mL Intracatheter PRN Lequita Asal, MD        Review of Systems  Constitutional: Positive for malaise/fatigue. Negative for chills, diaphoresis, fever and weight loss (7 pound increase).       Fatigue.  HENT: Negative for congestion, ear discharge, ear pain, nosebleeds, sinus pain, sore throat and tinnitus.   Eyes: Negative for blurred vision,  double vision, pain, discharge and redness.  Respiratory: Negative.  Negative for cough, hemoptysis, sputum production and shortness of breath.   Cardiovascular: Positive for leg swelling (left). Negative for chest pain, palpitations, orthopnea, claudication and PND.  Gastrointestinal: Negative for abdominal pain, blood in stool, constipation, diarrhea, heartburn, melena, nausea and vomiting.  Genitourinary: Negative.  Negative for dysuria, frequency, hematuria and urgency.  Musculoskeletal: Negative for back pain, joint pain and myalgias.       Left arm sore.  Skin: Negative.  Negative for itching and rash.  Neurological: Negative.  Negative for dizziness, tingling, tremors, sensory change, speech change, focal weakness, weakness (general) and headaches.  Endo/Heme/Allergies: Does not bruise/bleed easily.       Diabetes  Psychiatric/Behavioral: Negative for depression and memory loss. The patient is not nervous/anxious.   All other systems reviewed and are negative.  Physical Exam: Pulse 96, temperature (!) 95.7 F (35.4 C), temperature source Tympanic, weight 122 lb 5 oz (55.5 kg). GENERAL:  Thin elderly woman sitting comfortably in a wheelchair in the exam room in no acute  distress. MENTAL STATUS:  Alert and oriented to person, place and time. HEAD:  Short gray hair.  Normocephalic, atraumatic, face symmetric, no Cushingoid features. EYES:  Brown eyes.  Pupils equal round and reactive to light and accomodation.  No conjunctivitis.  Slight scleral icterus. ENT:  Oropharynx clear without lesion.  Tongue normal. Mucous membranes moist.  RESPIRATORY:  Clear to auscultation without rales, wheezes or rhonchi. CARDIOVASCULAR:  Regular rate and rhythm without murmur, rub or gallop. ABDOMEN:  Soft, non-tender, with active bowel sounds, and no hepatosplenomegaly.  No masses. SKIN:  Mild jaundice.  Marked ecchymosis in sacral area extending around abdomen.  No ulcers or lesions. EXTREMITIES:Left forearm edematous with deep ecchymosis.  Hand swollen and somewhat cool.  Difficult to assess pulse.  Left lower extremity edema. LYMPH NODES: No palpable cervical, supraclavicular, axillary or inguinal adenopathy  NEUROLOGICAL: Unremarkable. PSYCH:  Appropriate.    Orders Only on 05/21/2018  Component Date Value Ref Range Status  . Sodium 05/21/2018 133* 135 - 145 mmol/L Final  . Potassium 05/21/2018 3.6  3.5 - 5.1 mmol/L Final  . Chloride 05/21/2018 96* 98 - 111 mmol/L Final  . CO2 05/21/2018 26  22 - 32 mmol/L Final  . Glucose, Bld 05/21/2018 181* 70 - 99 mg/dL Final  . BUN 05/21/2018 13  8 - 23 mg/dL Final  . Creatinine, Ser 05/21/2018 0.46  0.44 - 1.00 mg/dL Final  . Calcium 05/21/2018 8.6* 8.9 - 10.3 mg/dL Final  . Total Protein 05/21/2018 7.6  6.5 - 8.1 g/dL Final  . Albumin 05/21/2018 3.2* 3.5 - 5.0 g/dL Final  . AST 05/21/2018 34  15 - 41 U/L Final  . ALT 05/21/2018 17  0 - 44 U/L Final  . Alkaline Phosphatase 05/21/2018 88  38 - 126 U/L Final  . Total Bilirubin 05/21/2018 3.4* 0.3 - 1.2 mg/dL Final  . GFR calc non Af Amer 05/21/2018 >60  >60 mL/min Final  . GFR calc Af Amer 05/21/2018 >60  >60 mL/min Final   Comment: (NOTE) The eGFR has been calculated using  the CKD EPI equation. This calculation has not been validated in all clinical situations. eGFR's persistently <60 mL/min signify possible Chronic Kidney Disease.   Julie Holland gap 05/21/2018 11  5 - 15 Final   Performed at [facility], [address] [place], [place] [address]  . WBC 05/21/2018 11.4* 4.0 - 10.5 K/uL Final   ADJUSTED  FOR NUCLEATED RBC'S  . RBC 05/21/2018 3.70* 3.80 - 5.20 MIL/uL Final  . Hemoglobin 05/21/2018 10.9* 12.0 - 16.0 g/dL Final  . HCT 05/21/2018 32.2* 35.0 - 47.0 % Final  . MCV 05/21/2018 87.0  80.0 - 100.0 fL Final  . MCH 05/21/2018 29.4  26.0 - 34.0 pg Final  . MCHC 05/21/2018 33.7  32.0 - 36.0 g/dL Final  . RDW 05/21/2018 16.8* 11.5 - 14.5 % Final  . Platelets 05/21/2018 36* 150 - 440 K/uL Final  . Neutrophils Relative % 05/21/2018 2  % Final  . Lymphocytes Relative 05/21/2018 14  % Final  . Monocytes Relative 05/21/2018 7  % Final  . Eosinophils Relative 05/21/2018 6  % Final  . Basophils Relative 05/21/2018 0  % Final  . Blasts 05/21/2018 71  % Final  . nRBC 05/21/2018 105* 0 /100 WBC Final  . Neutro Abs 05/21/2018 0.2* 1.4 - 6.5 K/uL Final  . Lymphs Abs 05/21/2018 1.6  1.0 - 3.6 K/uL Final  . Monocytes Absolute 05/21/2018 0.8  0.2 - 0.9 K/uL Final  . Eosinophils Absolute 05/21/2018 0.7  0 - 0.7 K/uL Final  . Basophils Absolute 05/21/2018 0.0  0 - 0.1 K/uL Final  . RBC Morphology 05/21/2018 POLYCHROMASIA PRESENT   Corrected   Comment: MIXED RBC POPULATION CORRECTED ON 09/13 AT 0902: PREVIOUSLY REPORTED AS POLYCHROMASIA PRESENT RARE NRBCs   . WBC Morphology 05/21/2018 SMUDGE CELLS   Final  . Smear Review 05/21/2018 PLATELETS VARY IN SIZE   Final   Comment:   , INCREASE IN NUMBER OF NRBCS NOTED  Performed at [facility], [address], [place], [place] [address]   Infusion on 05/19/2018  Component Date Value Ref Range Status  . Order Confirmation 05/19/2018    Final                   Value:ORDER PROCESSED BY BLOOD BANK Performed at  [facility], [address] [place], [place] [address]   Appointment on 05/19/2018  Component Date Value Ref Range Status  . WBC 05/19/2018 20.7* 4.0 - 10.5 K/uL Final   ADJUSTED FOR NUCLEATED RBC'S  . RBC 05/19/2018 2.42* 3.80 - 5.20 MIL/uL Final  . Hemoglobin 05/19/2018 7.2* 12.0 - 16.0 g/dL Final   DELTA CHECK NOTED  . HCT 05/19/2018 21.9* 35.0 - 47.0 % Final  . MCV 05/19/2018 90.5  80.0 - 100.0 fL Final  . MCH 05/19/2018 30.0  26.0 - 34.0 pg Final  . MCHC 05/19/2018 33.1  32.0 - 36.0 g/dL Final  . RDW 05/19/2018 17.3* 11.5 - 14.5 % Final  . Platelets 05/19/2018 28* 150 - 400 K/uL Final   Comment: RESULT REPEATED AND VERIFIED PLATELET COUNT CONFIRMED BY SMEAR CRITICAL RESULT CALLED TO, READ BACK BY AND VERIFIED WITH: BRYAN GRAY @ 8:37AM 05/19/2018 BY L[facility] PLATELETS VARY IN SIZE   . Neutrophils Relative % 05/19/2018 2  % Final  . Lymphocytes Relative 05/19/2018 13  % Final  . Monocytes Relative 05/19/2018 4  % Final  . Eosinophils Relative 05/19/2018 8  % Final  . Basophils Relative 05/19/2018 0  % Final  . Blasts 05/19/2018 73  % Final  . nRBC 05/19/2018 78* 0 /100 WBC Final  . Neutro Abs 05/19/2018 0.4* 1.4 - 6.5 K/uL Final  . Lymphs Abs 05/19/2018 2.7  1.0 - 3.6 K/uL Final  . Monocytes Absolute 05/19/2018 0.8  0.2 - 0.9 K/uL Final  . Eosinophils Absolute 05/19/2018 1.7* 0 - 0.7 K/uL Final  .  Basophils Absolute 05/19/2018 0.0  0 - 0.1 K/uL Final  . RBC Morphology 16/06/9603 BASOPHILIC STIPPLING   Final   MIXED RBC POPULATION  . 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 Bone marrow biopsy on 04/19/2018 was consistent with AML. Cytogenetics were normal (19,  XX) on first 10 metaphases. MLL FISH revealed no MLL rearrangement. BEAT AML trial was offered and declined.   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   She has a history of rectal bleedingrequiring 2 units of PRBCs on 07/31/2016. She underwent oversewing of a bleeding arterial vessel at the site of previous mucosal resection.   [facility] with MSSA sepsisin the setting of right wrist abscess after a fall and injury. TEE was negative.She underwent incision and drainageandwas treated with IV cefazolin x 6 weeks.[facility]08[facility] no evidence for soft tissue swelling or drainable fluid collection, suggesting pain wassecondary to TFCC tear/degeneration.  She was admitted to [facility] 02/18/2018 - 02/20/2018 with a GIbleed. She received 2 units of PRBCs. EGD revealed gastric ulcers which were non-bleeding and gastritis.   She is transfusion dependent.  Blood products are leukopoor and irradiated.  Peripheral blasts were 28% on 05/07/2018, 57% on 05/10/2018, 60% on 05/12/2018, 65% on 05/14/2018, and 71% on 05/21/2018.   She has mild hyperbilirubinemia of unclear etiology.  Bilirubin was 1.7 on 05/07/2018, 2.8 on 05/14/2018, and 3.4 on 05/21/2018.  Bilirubin is predominantly indirect.  Symptomatically,she remains fatigued.  She has left arm and hand swelling. She denies any fever.  Exam reveals ecchymosis in left forearm and swelling in her hand.  Pulses are difficult to appreciate.  She has extensive sacral ecchymosis which has extended around her abdomen.  She is mildly jaundiced.  Hemoglobin is 10.9. Platelets are 36,000. ANC is 200.   Plan: 1. Labs today:  CBC with diff, CMP, hold tube, direct bili, PT, PTT, fibrinogen. 2.   Acute myelogenous leukemia:  Continue venetoclax.  Blasts remain  elevated.  Hematocrit has improved significantly.  Platelet count is stable.  Plan for additional cycle of treatment with azacytidine and venetoclax. 3.   Anemia and thrombocytopenia:              Hematocrit has improved.  Doubt significant hemolysis as hemoglobin improving.              Platelets are adequate.   Check PT, PTT, fibrinogen to r/o DIC. 4.   Neutropenia:             Continue neutropenic precautions. 5.   Hyperbilirubinemia:             Fractionate bilirubin             Doubt any significant hemolysis as hemoglobin improving.  Contact [facility] and [facility] pharmamcy re: drug interactions and drug toxicity with posaconazole (just started) and venetoclax. 7.   Left arm and hand edema:  Etiology unclear.  Referral to vascular surgery this morning.        8.   RTC on 05/24/2018 for MD assessment, labs (CBC with diff, CMP, Mg), +/- transfusion, and initiation  of cycle #2 azacytidine.  Addendum:  Spoke with Dr. Prince Solian, [facility] and [facility] pharmacy regarding elevated bilirubin.  Doubt posaconazole causing hyperbilirubinemia as just started.  Will contact patient regarding holding drug as currently being used as a prophylactic antifungal.  There are no dose adjustments provided by the manufacturer for liver toxicity during treatment and no dose adjustment with pre-existing liver disease.  Plan to restart after issue resolved.  Venetoclax to be reduced by 50% with Child-Pugh Class C.  AST elevation only reported.   Lequita Asal, MD  05/21/2018

## 2018-05-21 NOTE — Progress Notes (Signed)
Patient has bilateral edema in lower extremities.  Worse on left side.  Left hand and arm purple, edematous and painful.  Patient states arm has been like this several weeks.

## 2018-05-21 NOTE — Assessment & Plan Note (Signed)
blood pressure control important in reducing the progression of atherosclerotic disease. On appropriate oral medications.  

## 2018-05-21 NOTE — Assessment & Plan Note (Signed)
The patient has several weeks of unexplained arm swelling and discoloration.  To assess her, noninvasive studies were done today.  No DVT was identified in the venous system.  Normal triphasic waveforms were seen in the radial artery and ulnar artery on the left so no arterial insufficiency is present.  Her husband relates this to starting after physical therapy while she was at rehabilitation.  I think it is highly likely that she developed some sort of muscle pull or tear due to her deconditioned state and with her thrombocytopenia developed marked muscular and subcutaneous bruising which has been tracking down the arm towards her hand.  I have recommended that she elevate her arm and use warm compresses as needed.  I recommended a compression sleeve to try to help with the swelling and provide pressure and avoid more bruising.  She does not have an acute vascular problem that will require any immediate intervention which is encouraging.  I have discussed the pathophysiology and natural history of both arterial and venous disease with she and her husband today.  I will see her back as needed.

## 2018-05-21 NOTE — Assessment & Plan Note (Signed)
blood glucose control important in reducing the progression of atherosclerotic disease. Also, involved in wound healing. On appropriate medications.  

## 2018-05-22 ENCOUNTER — Telehealth: Payer: Self-pay | Admitting: Hematology and Oncology

## 2018-05-22 NOTE — Telephone Encounter (Signed)
Re:  Holding voraconazole  Spoke with Dr. Prince Solian, [facility] and [facility] pharmacy.  Unclear if posaconazole causing increase in bilirubin.  Discussed holding posaconazole with patient and husband.  Reviewed events at vascular surgery appointment yesterday.  Several questions asked and answered.   Lequita Asal, MD

## 2018-05-23 ENCOUNTER — Encounter: Payer: Self-pay | Admitting: Hematology and Oncology

## 2018-05-23 NOTE — Addendum Note (Signed)
Addended by: Nolon Stalls C on: 05/23/2018 01:40 PM   Modules accepted: Level of Service

## 2018-05-23 NOTE — Progress Notes (Signed)
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  HPI:  The patient was last seen in the hematology clinic on 05/21/2018.  At that time, patient was fatigued. No B symptoms or infections. She had significant bruising and swelling to her LEFT upper and lower extremities. She was taking Venetoclax and posaconazole at home. Exam revealed extensive ecchymosis to the LEFT upper and lower extremities, abdomen, and sacral area. Pulses difficult to palpate in extremities. Skin was jaundiced. ANC 200. Hemoglobin 10.9. Platelets 36,000. Total bilirubin 3.4 (direct 1.1).   Additional labs done on 05/21/2018 included a normal PT and aPTT. Fibrinogen level was elevated at 560 (0 - 475 mg/dL).  Patient was seen in urgent outpatient consult by Dr. Leotis Pain on 05/21/2018. Notes reviewed.  Venous duplex revealed no evidence of DVT. Patient participating in physical therapy. Her symptoms were felt to be related to a minor trauma from therapy, or where she had PIV inserted. Patient to follow up with vascular on an as needed basis.   Telephone consult with Dr. Prince Solian, as well as pharmacy at both [facility] and [facility], regarding patient's acute hyperbilirubinmemia. Discussed posaconazole as being a possible etiology, however given the fact that the patient just started this medication, it was felt to be unlikely. The decision was made to hold the prophylactic antifungal medication until her bilirubin normalized. Patient was contacted on 05/22/2018 and asked to hold her posaconazole until further notice.   In the interim, patient continues to have marked swelling and bruising to her LEFT upper extremity. LEFT upper extremity is dark purple in color and the skin is taught. Patient describes an "achy" sensation. Hand is cool to touch, however sensation remains intact. She also  continues to have swelling and bruising to her LEFT lower extremity that extends distally from the groin. She denies pain in her leg. She has not experienced any chest pain or shortness of breath.   Patient denies that she has experienced any B symptoms. She denies any interval infections. Patient denies bleeding; no hematochezia, melena, or gross hematuria. She spends a great deal of her day "resting". She is able to independently complete all of her activities of daily living.   Patient advises that she maintains an adequate appetite. She is eating well. Weight today is 126 lb 9.6 oz (57.4 kg), which compared to her last visit to the clinic, represents a 3 pound increase.   Patient denies pain in the clinic today.   Past Medical History:  Diagnosis Date  . Arthritis   . Asthma   . 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  . Cataract   . Chronic kidney disease    stage 1  . Diabetes mellitus 2007   type II  . Dry eye of right side   . GERD (gastroesophageal reflux disease)    OCC  . Glaucoma 2018   RIGHT EYE   . Hemorrhoids   . Hyperlipidemia   . Hypertension     Past Surgical History:  Procedure Laterality Date  . CATARACT EXTRACTION, BILATERAL    . CHOLECYSTECTOMY    . COLONOSCOPY  02/13/13   Dr Bary Castilla  . EPIGASTRIC HERNIA REPAIR N/A 03/20/2015   Procedure: HERNIA REPAIR EPIGASTRIC ADULT;  Surgeon: Robert Bellow, MD;  Location: [facility] ORS;  Service: General;  Laterality: N/A;  . ESOPHAGOGASTRODUODENOSCOPY N/A 02/19/2018   Procedure: ESOPHAGOGASTRODUODENOSCOPY (EGD);  Surgeon: Lin Landsman, MD;  Location: [facility] ENDOSCOPY;  Service: Gastroenterology;  Laterality: N/A;  . HEMORRHOID SURGERY N/A 03/20/2015    FOCAL HIGH-GRADE SQUAMOUS INTRAEPITHELIAL LESION (HSIL, ANAL /HEMORRHOIDECTOMY;   Robert Bellow, MD [facility] ORS;  : General;  Laterality: N/A;  . HERNIA REPAIR  July 2016   Epigastric hernia, primary  repair  . INCISION AND DRAINAGE Right 02/11/2018   Procedure: INCISION AND DRAINAGE- RIGHT HAND;  Surgeon: Hessie Knows, MD;  Location: [facility] ORS;  Service: Orthopedics;  Laterality: Right;  . TONSILLECTOMY  age 75  . TOTAL ABDOMINAL HYSTERECTOMY  01/1989  . TUBAL LIGATION    . 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  Case has been presented at the [facility] tumor board. No indication for additional treatment outside of serial exams  Surgeon: Robert Bellow, MD;  Location: [facility] ORS;  Service: General;  Laterality: N/A;    Family History  Problem Relation Age of Onset  . Bladder Cancer Mother   . Colon cancer Father   . Lung cancer Brother   . Lymphoma Sister   . Breast cancer Neg Hx     Social History:  reports that she has never smoked. She has never used smokeless tobacco. She reports that she does not drink alcohol or use drugs.  She lives in [place] with her husband.  She denies any exposure to radiations or toxins.  Her husband's name is Julie Holland.  The patient is accompanied by her husband today.  Allergies:  Allergies  Allergen Reactions  . Hydrocodone Nausea And Vomiting and Other (See Comments)    Vertigo but patient takes hydrocodone/acetaminophen outpatient    Current Medications: Current Outpatient Medications  Medication Sig Dispense Refill  . ACCU-CHEK SMARTVIEW test strip USE AS INSTRUCTED TO CHECK  BLOOD GLUCOSE ONCE DAILY if desired; LON 99 months; Dx E11.9 100 each 1  . Carboxymeth-Glycerin-Polysorb (REFRESH OPTIVE ADVANCED OP) Place 1 drop into both eyes 3 (three) times daily as needed (for dry/irritated eyes).    . carvedilol (COREG) 6.25 MG tablet Take 1 tablet (6.25 mg total) by mouth 2 (two) times daily with a meal. 180 tablet 1  . latanoprost (XALATAN) 0.005 % ophthalmic solution Place 1 drop at bedtime into the right eye.    Marland Kitchen levofloxacin (LEVAQUIN) 500 MG tablet Take 500  mg by mouth.    Marland Kitchen lisinopril (PRINIVIL,ZESTRIL) 10 MG tablet TAKE 1 TABLET BY MOUTH  DAILY 90 tablet 1  . metFORMIN (GLUCOPHAGE) 850 MG tablet Take 1 tablet (850 mg total) by mouth 2 (two) times daily with a meal. 180 tablet 0  . Multiple Vitamin (MULTIVITAMIN) tablet Take 2 tablets by mouth daily.     . Nutritional Supplements (ESTROVEN PO) Take 1 tablet by mouth daily.    . pantoprazole (PROTONIX) 40 MG tablet Take 1 tablet (40 mg total) by mouth 2 (two) times daily before a meal. 180 tablet 0  . simvastatin (ZOCOR) 20 MG tablet Take 1 tablet (20 mg total) by mouth at bedtime. 90 tablet 0  . acetaminophen (TYLENOL) 500 MG tablet Take 500-1,000 mg by mouth every 6 (six) hours as needed (for pain).    . ADVAIR DISKUS 100-50 MCG/DOSE AEPB USE 1 PUFF TWO TIMES DAILY (Patient not taking: Reported on 05/24/2018) 3 each 2  . albuterol (PROVENTIL HFA;VENTOLIN HFA) 108 (90 Base) MCG/ACT  inhaler Inhale 2 puffs into the lungs every 6 (six) hours as needed for wheezing or shortness of breath. (Patient not taking: Reported on 05/24/2018) 1 Inhaler 2  . ferrous sulfate 325 (65 FE) MG tablet Take 1 tablet (325 mg total) by mouth daily. (Patient not taking: Reported on 05/24/2018)    . posaconazole (NOXAFIL) 100 MG TBEC delayed-release tablet Take 300 mg by mouth daily.     No current facility-administered medications for this visit.    Facility-Administered Medications Ordered in Other Visits  Medication Dose Route Frequency Provider Last Rate Last Dose  . sodium chloride flush (NS) 0.9 % injection 10 mL  10 mL Intracatheter PRN Corcoran, Melissa C, MD      . sodium chloride flush (NS) 0.9 % injection 3 mL  3 mL Intracatheter PRN Lequita Asal, MD        Review of Systems  Constitutional: Positive for malaise/fatigue. Negative for diaphoresis, fever and weight loss (weight up 3 pounds).  HENT: Negative.   Eyes: Negative.        Scleral icterus  Respiratory: Negative for cough, hemoptysis, sputum  production and shortness of breath.   Cardiovascular: Positive for leg swelling (LEFT). Negative for chest pain, palpitations, orthopnea and PND.  Gastrointestinal: Negative for abdominal pain, blood in stool, constipation, diarrhea, melena, nausea and vomiting.  Genitourinary: Negative for dysuria, frequency, hematuria and urgency.  Musculoskeletal: Negative for back pain, falls, joint pain and myalgias.       Left upper extremity achy, no better than 05/21/2018.  Skin: Negative for itching and rash.       Jaundice  Neurological: Positive for weakness (generalized). Negative for dizziness, tremors and headaches.  Endo/Heme/Allergies: Bruises/bleeds easily.       Diabetes  Psychiatric/Behavioral: Negative for depression, memory loss and suicidal ideas. The patient is not nervous/anxious and does not have insomnia.   All other systems reviewed and are negative.  Performance status (ECOG): 2 - Symptomatic, <50% confined to bed  Vital signs: BP 104/61 (BP Location: Left Arm, Patient Position: Sitting)   Pulse 93   Temp 98.5 F (36.9 C) (Tympanic)   Resp 18   Wt 126 lb 9.6 oz (57.4 kg)   BMI 22.43 kg/m   Physical Exam  Constitutional: She is oriented to person, place, and time and well-developed, well-nourished, and in no distress.  HENT:  Head: Normocephalic and atraumatic.  Short gray hair.  Eyes: Pupils are equal, round, and reactive to light. EOM are normal. Scleral icterus is present.  Brown eyes  Neck: Normal range of motion. Neck supple. No tracheal deviation present. No thyromegaly present.  Cardiovascular: Normal rate, regular rhythm and normal heart sounds. Exam reveals decreased pulses (LEFT upper extremity). Exam reveals no gallop and no friction rub.  No murmur heard. Pulmonary/Chest: Effort normal and breath sounds normal. No respiratory distress. She has no wheezes. She has no rales.  Abdominal: Soft. Bowel sounds are normal. She exhibits no distension. There is no  tenderness.  Musculoskeletal: Normal range of motion. She exhibits edema (LEFT upper and lower extremities).       Left hand: She exhibits tenderness ("achy") and swelling.       Left lower leg: She exhibits tenderness (bruising from groin extending distally) and swelling.  Left hand markedly edematous, purple, and slightly cool.  Lymphadenopathy:    She has no cervical adenopathy.    She has no axillary adenopathy.       Right: No inguinal and no supraclavicular adenopathy present.  Left: No inguinal and no supraclavicular adenopathy present.  Neurological: She is alert and oriented to person, place, and time.  Skin: Skin is warm and dry. Ecchymosis (LEFT upper and lower extremities) noted. No rash noted. No erythema.  Jaundice. Marked ecchymosis to abdomen extending circumferentially to sacral area.   Psychiatric: Mood, affect and judgment normal.  Tearful at times.  Nursing note and vitals reviewed.   Appointment on 05/24/2018  Component Date Value Ref Range Status  . Magnesium 05/24/2018 1.4* 1.7 - 2.4 mg/dL Final   Performed at [facility], [address] [place], [place] [address]  . Sodium 05/24/2018 130* 135 - 145 mmol/L Final  . Potassium 05/24/2018 3.8  3.5 - 5.1 mmol/L Final  . Chloride 05/24/2018 94* 98 - 111 mmol/L Final  . CO2 05/24/2018 28  22 - 32 mmol/L Final  . Glucose, Bld 05/24/2018 197* 70 - 99 mg/dL Final  . BUN 05/24/2018 20  8 - 23 mg/dL Final  . Creatinine, Ser 05/24/2018 0.75  0.44 - 1.00 mg/dL Final  . Calcium 05/24/2018 8.2* 8.9 - 10.3 mg/dL Final  . Total Protein 05/24/2018 6.6  6.5 - 8.1 g/dL Final  . Albumin 05/24/2018 3.0* 3.5 - 5.0 g/dL Final  . AST 05/24/2018 44* 15 - 41 U/L Final  . ALT 05/24/2018 22  0 - 44 U/L Final  . Alkaline Phosphatase 05/24/2018 86  38 - 126 U/L Final  . Total Bilirubin 05/24/2018 3.0* 0.3 - 1.2 mg/dL Final  . GFR calc non Af Amer 05/24/2018 >60  >60 mL/min Final  . GFR calc Af Amer 05/24/2018 >60  >60  mL/min Final   Comment: (NOTE) The eGFR has been calculated using the CKD EPI equation. This calculation has not been validated in all clinical situations. eGFR's persistently <60 mL/min signify possible Chronic Kidney Disease.   Georgiann Hahn gap 05/24/2018 8  5 - 15 Final   Performed at [facility], [address] [place], [place] [address]  . WBC 05/24/2018 9.5  4.0 - 10.5 K/uL Final   ADJUSTED FOR NUCLEATED RBC'S  . RBC 05/24/2018 2.07* 3.80 - 5.20 MIL/uL Final  . Hemoglobin 05/24/2018 6.2* 12.0 - 16.0 g/dL Final  . HCT 05/24/2018 19.0* 35.0 - 47.0 % Final  . MCV 05/24/2018 92.0  80.0 - 100.0 fL Final  . MCH 05/24/2018 30.2  26.0 - 34.0 pg Final  . MCHC 05/24/2018 32.8  32.0 - 36.0 g/dL Final  . RDW 05/24/2018 19.2* 11.5 - 14.5 % Final  . Platelets 05/24/2018 35* 150 - 440 K/uL Final  . Neutrophils Relative % 05/24/2018 6  % Final  . Lymphocytes Relative 05/24/2018 20  % Final  . Monocytes Relative 05/24/2018 4  % Final  . Eosinophils Relative 05/24/2018 5  % Final  . Basophils Relative 05/24/2018 0  % Final  . Blasts 05/24/2018 65  % Final  . nRBC 05/24/2018 95* 0 /100 WBC Final  . Neutro Abs 05/24/2018 0.6* 1.4 - 6.5 K/uL Final  . Lymphs Abs 05/24/2018 1.9  1.0 - 3.6 K/uL Final  . Monocytes Absolute 05/24/2018 0.4  0.2 - 0.9 K/uL Final  . Eosinophils Absolute 05/24/2018 0.5  0 - 0.7 K/uL Final  . Basophils Absolute 05/24/2018 0.0  0 - 0.1 K/uL Final  . RBC Morphology 05/24/2018 MIXED RBC POPULATION   Final   POLYCHROMASIA PRESENT  . WBC Morphology 05/24/2018 SMUDGE CELLS   Final  . Smear Review 05/24/2018   PLATELETS VARY IN SIZE, LARGE  BODY PLATELETS NOTED ON SMEAR   Final   Comment: PLATELETS APPEAR DECREASED Performed at [facility], [address] [place], [place] [address]   . Blood Bank Specimen 05/24/2018 SAMPLE AVAILABLE FOR TESTING   Final  . 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 Bone marrow biopsy on 04/19/2018 was consistent with AML. Cytogenetics were normal (28, XX) on first 10 metaphases. MLL FISH revealed no MLL rearrangement. BEAT AML trial was offered and declined.   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   She has a history of rectal bleedingrequiring 2 units of PRBCs on 07/31/2016. She underwent oversewing of a bleeding arterial vessel at the site of previous mucosal resection.   [facility] with MSSA sepsisin the setting of right wrist abscess after a fall and injury. TEE was negative.She underwent incision and drainageandwas treated with IV cefazolin x 6 weeks.[facility]08[facility] no evidence for soft tissue swelling or drainable fluid collection, suggesting pain wassecondary to TFCC tear/degeneration.  She was admitted to [facility] 02/18/2018 - 02/20/2018 with a GIbleed. She received 2 units of PRBCs. EGD revealed gastric ulcers which were non-bleeding and gastritis.   She is transfusion dependent.  Blood products are leukopoor and irradiated.  Peripheral blasts were 28% on 05/07/2018, 57% on 05/10/2018, 60% on 05/12/2018, 65% on 05/14/2018, and 71% on 05/21/2018.   She has mild hyperbilirubinemia of unclear etiology.  Bilirubin was 1.7 on 05/07/2018, 2.8 on 05/14/2018, and 3.4 on 05/21/2018.  Bilirubin is predominantly indirect.  Symptomatically,she remains fatigued. There has been progressive swelling to her LEFT upper and lower extremities. Patient describes areas as "achy". She denies any fever.  Exam reveals ecchymosis in left forearm and  swelling in her hand.  She has progressive swelling since 05/21/2018.  Hand is slightly cool and pulses difficult to appreciate.  She has increased left lower extremity edema distally from her hip down.  She has extensive sacral ecchymosis which has extended around her abdomen.  She is mildly jaundiced.  Hemoglobin has dropped to 6.2 (previously 10.9). Platelets are 35,000. ANC is 600.   Plan: 1. Labs today: CBC with diff, CMP, Mg, hold tube, DAT, haptoglobin 2. AML  Blast count remains elevated.  Continue venetoclax  Labs reviewed. Blood counts have unexplainably dropped. Will postpone cycle #2 azacitadine secondary to progressive changes in left arm and leg.  Discuss admission to inpatient unit for further evaluation of acute symptoms. Spoke with Dr. Bridgett Larsson (hospitalist) who agrees to admit patient to medicine. Patient to 1C (107).  3. Pancytopenia  Hemoglobin 6.2, hematocrit 19.2, and platelets 35,000.   GI blood loss vs. progressive disease vs. bleeding into soft tissues.   ANC 600. Reinforce neutropenic precautions.   Will transfuse 2 units of irradiated PRBCs and admit to inpatient unit for continued monitoring.  4. Hyperbilirubinemia  Total bilirubin has improved to 3.0. Patient remains jaundiced.   Etiology unknown.  Possibly due to hemolysis or breakdown of RBCs in tissues.  Posaconazole on hold at this point.  5. LEFT upper and  lower extremity ecchymosis and edema  Evaluated by vascular (Dr. Lucky Cowboy) and found not have no evidence of vascular insufficiency or DVT.  Felt to be related to minor trauma or previous PIV placement.   Spoke with Dr. Rudene Christians (orthopedics) who agrees to see the patient in consult as an inpatient. 6. Anticipate initiation of cycle #2 azacitadine in the outpatient department in next few days. 7. RTC on Monday, Wednesdays, and Fridays for labs (CBC with diff, hold tube), and +/- PRBCs and platelets.    A total of (>40) minutes of face-to-face time was  spent with the patient with greater than 50% of that time in counseling and care-coordination.  I had a long talk with the patient and her husband.  He expressed frustration over me "finding things wrong with her" and "needing time as a couple".  I reinforced the severity of her illness and the complications to date.  She requires close management secondary to her illness.  Time away from the clinic or hospital at this time would be detrimental to her health.  She stated that she wished to have treatment and declined supportive care/Hospice.  I spoke with Bridgett Larsson, hospitalist, regarding admission.  I spoke with Dr.Menz regarding her past history of right wrist infection (known to him) and current issues with marked swelling of the left upper (including hand) and lower extremity, drop in hemoglobin, and concern for possible bleed into soft tissues.    Honor Loh, NP  05/24/18, 9:35 AM   I saw and evaluated the patient, participating in the key portions of the service and reviewing pertinent diagnostic studies and records.  I reviewed the nurse practitioner's note and agree with the findings and the plan.  The assessment and plan were discussed with the patient.  Multiple questions were asked by the patient and answered.   Nolon Stalls, MD 05/24/2018,9:35 AM

## 2018-05-24 ENCOUNTER — Inpatient Hospital Stay
Admission: AD | Admit: 2018-05-24 | Discharge: 2018-05-27 | DRG: 835 | Disposition: A | Discharge status: Other Acute Inpatient Hospital | Payer: Medicare Other | Source: Ambulatory Visit | Attending: Internal Medicine | Admitting: Internal Medicine

## 2018-05-24 ENCOUNTER — Inpatient Hospital Stay: Payer: Medicare Other

## 2018-05-24 ENCOUNTER — Other Ambulatory Visit: Payer: Self-pay

## 2018-05-24 ENCOUNTER — Telehealth: Payer: Self-pay | Admitting: *Deleted

## 2018-05-24 ENCOUNTER — Inpatient Hospital Stay (HOSPITAL_BASED_OUTPATIENT_CLINIC_OR_DEPARTMENT_OTHER): Payer: Medicare Other | Admitting: Hematology and Oncology

## 2018-05-24 VITALS — BP 104/61 | HR 93 | Temp 98.5°F | Resp 18 | Wt 126.6 lb

## 2018-05-24 DIAGNOSIS — E785 Hyperlipidemia, unspecified: Secondary | ICD-10-CM | POA: Diagnosis not present

## 2018-05-24 DIAGNOSIS — H409 Unspecified glaucoma: Secondary | ICD-10-CM | POA: Diagnosis not present

## 2018-05-24 DIAGNOSIS — R531 Weakness: Secondary | ICD-10-CM

## 2018-05-24 DIAGNOSIS — R609 Edema, unspecified: Secondary | ICD-10-CM | POA: Diagnosis not present

## 2018-05-24 DIAGNOSIS — C9200 Acute myeloblastic leukemia, not having achieved remission: Secondary | ICD-10-CM

## 2018-05-24 DIAGNOSIS — Z9841 Cataract extraction status, right eye: Secondary | ICD-10-CM | POA: Diagnosis not present

## 2018-05-24 DIAGNOSIS — C9110 Chronic lymphocytic leukemia of B-cell type not having achieved remission: Secondary | ICD-10-CM | POA: Diagnosis present

## 2018-05-24 DIAGNOSIS — K828 Other specified diseases of gallbladder: Secondary | ICD-10-CM | POA: Diagnosis not present

## 2018-05-24 DIAGNOSIS — M1990 Unspecified osteoarthritis, unspecified site: Secondary | ICD-10-CM | POA: Diagnosis not present

## 2018-05-24 DIAGNOSIS — D696 Thrombocytopenia, unspecified: Secondary | ICD-10-CM

## 2018-05-24 DIAGNOSIS — R2240 Localized swelling, mass and lump, unspecified lower limb: Secondary | ICD-10-CM | POA: Diagnosis not present

## 2018-05-24 DIAGNOSIS — Z7984 Long term (current) use of oral hypoglycemic drugs: Secondary | ICD-10-CM | POA: Diagnosis not present

## 2018-05-24 DIAGNOSIS — D6959 Other secondary thrombocytopenia: Secondary | ICD-10-CM | POA: Diagnosis present

## 2018-05-24 DIAGNOSIS — Z5111 Encounter for antineoplastic chemotherapy: Secondary | ICD-10-CM | POA: Insufficient documentation

## 2018-05-24 DIAGNOSIS — R600 Localized edema: Secondary | ICD-10-CM

## 2018-05-24 DIAGNOSIS — D649 Anemia, unspecified: Secondary | ICD-10-CM | POA: Diagnosis present

## 2018-05-24 DIAGNOSIS — D630 Anemia in neoplastic disease: Secondary | ICD-10-CM

## 2018-05-24 DIAGNOSIS — N181 Chronic kidney disease, stage 1: Secondary | ICD-10-CM | POA: Diagnosis present

## 2018-05-24 DIAGNOSIS — Z885 Allergy status to narcotic agent status: Secondary | ICD-10-CM | POA: Diagnosis not present

## 2018-05-24 DIAGNOSIS — Z79890 Hormone replacement therapy (postmenopausal): Secondary | ICD-10-CM

## 2018-05-24 DIAGNOSIS — D61818 Other pancytopenia: Secondary | ICD-10-CM | POA: Diagnosis not present

## 2018-05-24 DIAGNOSIS — E1122 Type 2 diabetes mellitus with diabetic chronic kidney disease: Secondary | ICD-10-CM | POA: Diagnosis not present

## 2018-05-24 DIAGNOSIS — K2970 Gastritis, unspecified, without bleeding: Secondary | ICD-10-CM | POA: Diagnosis not present

## 2018-05-24 DIAGNOSIS — Z66 Do not resuscitate: Secondary | ICD-10-CM | POA: Diagnosis present

## 2018-05-24 DIAGNOSIS — I129 Hypertensive chronic kidney disease with stage 1 through stage 4 chronic kidney disease, or unspecified chronic kidney disease: Secondary | ICD-10-CM | POA: Diagnosis not present

## 2018-05-24 DIAGNOSIS — D708 Other neutropenia: Secondary | ICD-10-CM

## 2018-05-24 DIAGNOSIS — R17 Unspecified jaundice: Secondary | ICD-10-CM | POA: Diagnosis present

## 2018-05-24 DIAGNOSIS — Z90710 Acquired absence of both cervix and uterus: Secondary | ICD-10-CM | POA: Diagnosis not present

## 2018-05-24 DIAGNOSIS — Z9842 Cataract extraction status, left eye: Secondary | ICD-10-CM | POA: Diagnosis not present

## 2018-05-24 DIAGNOSIS — Z79899 Other long term (current) drug therapy: Secondary | ICD-10-CM

## 2018-05-24 DIAGNOSIS — Z9049 Acquired absence of other specified parts of digestive tract: Secondary | ICD-10-CM

## 2018-05-24 DIAGNOSIS — R2232 Localized swelling, mass and lump, left upper limb: Secondary | ICD-10-CM | POA: Diagnosis not present

## 2018-05-24 DIAGNOSIS — Z23 Encounter for immunization: Secondary | ICD-10-CM

## 2018-05-24 DIAGNOSIS — J45909 Unspecified asthma, uncomplicated: Secondary | ICD-10-CM | POA: Diagnosis not present

## 2018-05-24 DIAGNOSIS — K259 Gastric ulcer, unspecified as acute or chronic, without hemorrhage or perforation: Secondary | ICD-10-CM | POA: Diagnosis not present

## 2018-05-24 DIAGNOSIS — N189 Chronic kidney disease, unspecified: Secondary | ICD-10-CM | POA: Diagnosis not present

## 2018-05-24 DIAGNOSIS — K219 Gastro-esophageal reflux disease without esophagitis: Secondary | ICD-10-CM | POA: Diagnosis not present

## 2018-05-24 LAB — GLUCOSE, CAPILLARY
Glucose-Capillary: 91 mg/dL (ref 70–99)
Glucose-Capillary: 110 mg/dL — ABNORMAL HIGH (ref 70–99)
Glucose-Capillary: 154 mg/dL — ABNORMAL HIGH (ref 70–99)

## 2018-05-24 LAB — PREPARE RBC (CROSSMATCH)

## 2018-05-24 LAB — COMPREHENSIVE METABOLIC PANEL
ALT: 22 U/L (ref 0–44)
AST: 44 U/L — ABNORMAL HIGH (ref 15–41)
Albumin: 3 g/dL — ABNORMAL LOW (ref 3.5–5.0)
Alkaline Phosphatase: 86 U/L (ref 38–126)
Anion gap: 8 (ref 5–15)
BUN: 20 mg/dL (ref 8–23)
CO2: 28 mmol/L (ref 22–32)
Calcium: 8.2 mg/dL — ABNORMAL LOW (ref 8.9–10.3)
Chloride: 94 mmol/L — ABNORMAL LOW (ref 98–111)
Creatinine, Ser: 0.75 mg/dL (ref 0.44–1.00)
GFR calc Af Amer: >60 mL/min (ref >60)
GFR calc non Af Amer: >60 mL/min (ref >60)
Glucose, Bld: 197 mg/dL — ABNORMAL HIGH (ref 70–99)
Potassium: 3.8 mmol/L (ref 3.5–5.1)
Sodium: 130 mmol/L — ABNORMAL LOW (ref 135–145)
Total Bilirubin: 3 mg/dL — ABNORMAL HIGH (ref 0.3–1.2)
Total Protein: 6.6 g/dL (ref 6.5–8.1)

## 2018-05-24 LAB — MAGNESIUM: Magnesium: 1.4 mg/dL — ABNORMAL LOW (ref 1.7–2.4)

## 2018-05-24 LAB — CBC WITH DIFFERENTIAL/PLATELET
Basophils Absolute: 0 10*3/uL (ref 0–0.1)
Basophils Relative: 0 %
Blasts: 65 %
Eosinophils Absolute: 0.5 10*3/uL (ref 0–0.7)
Eosinophils Relative: 5 %
HCT: 19 % — ABNORMAL LOW (ref 35.0–47.0)
Hemoglobin: 6.2 g/dL — ABNORMAL LOW (ref 12.0–16.0)
Lymphocytes Relative: 20 %
Lymphs Abs: 1.9 10*3/uL (ref 1.0–3.6)
MCH: 30.2 pg (ref 26.0–34.0)
MCHC: 32.8 g/dL (ref 32.0–36.0)
MCV: 92 fL (ref 80.0–100.0)
Monocytes Absolute: 0.4 10*3/uL (ref 0.2–0.9)
Monocytes Relative: 4 %
Neutro Abs: 0.6 10*3/uL — ABNORMAL LOW (ref 1.4–6.5)
Neutrophils Relative %: 6 %
Platelets: 35 10*3/uL — ABNORMAL LOW (ref 150–440)
RBC: 2.07 MIL/uL — ABNORMAL LOW (ref 3.80–5.20)
RDW: 19.2 % — ABNORMAL HIGH (ref 11.5–14.5)
WBC: 9.5 10*3/uL (ref 4.0–10.5)
nRBC: 95 /100 WBC — ABNORMAL HIGH

## 2018-05-24 LAB — DAT, POLYSPECIFIC AHG (ARMC ONLY): Polyspecific AHG test: NEGATIVE

## 2018-05-24 LAB — SAMPLE TO BLOOD BANK

## 2018-05-24 MED ORDER — INFLUENZA VAC SPLIT HIGH-DOSE 0.5 ML IM SUSY
0.5000 mL | PREFILLED_SYRINGE | INTRAMUSCULAR | Status: DC
  Filled 2018-05-24: qty 0.5

## 2018-05-24 MED ORDER — SODIUM CHLORIDE 0.9% IV SOLUTION
Freq: Once | INTRAVENOUS | Status: AC
  Administered 2018-05-24: 17:00:00 via INTRAVENOUS

## 2018-05-24 MED ORDER — BISACODYL 5 MG PO TBEC
5.0000 mg | DELAYED_RELEASE_TABLET | Freq: Every day | ORAL | Status: DC | PRN
  Administered 2018-05-26: 5 mg via ORAL
  Filled 2018-05-24: qty 1

## 2018-05-24 MED ORDER — CARVEDILOL 3.125 MG PO TABS
6.2500 mg | ORAL_TABLET | Freq: Two times a day (BID) | ORAL | Status: DC
  Administered 2018-05-25 – 2018-05-27 (×6): 6.25 mg via ORAL
  Filled 2018-05-24 (×8): qty 2

## 2018-05-24 MED ORDER — ACETAMINOPHEN 325 MG PO TABS
650.0000 mg | ORAL_TABLET | Freq: Four times a day (QID) | ORAL | Status: DC | PRN
  Administered 2018-05-24 – 2018-05-27 (×4): 650 mg via ORAL
  Filled 2018-05-24 (×3): qty 2

## 2018-05-24 MED ORDER — ONDANSETRON HCL 4 MG/2ML IJ SOLN: 4.0000 mg | Freq: Four times a day (QID) | INTRAMUSCULAR | Status: DC | PRN

## 2018-05-24 MED ORDER — INSULIN ASPART 100 UNIT/ML ~~LOC~~ SOLN
0.0000 [IU] | Freq: Three times a day (TID) | SUBCUTANEOUS | Status: DC
  Administered 2018-05-25 – 2018-05-26 (×6): 1 [IU] via SUBCUTANEOUS
  Administered 2018-05-27 (×3): 2 [IU] via SUBCUTANEOUS
  Filled 2018-05-24 (×10): qty 1

## 2018-05-24 MED ORDER — MAGNESIUM SULFATE 2 GM/50ML IV SOLN
2.0000 g | Freq: Once | INTRAVENOUS | Status: AC
  Administered 2018-05-24: 16:00:00 2 g via INTRAVENOUS
  Filled 2018-05-24: qty 50

## 2018-05-24 MED ORDER — ONDANSETRON HCL 4 MG PO TABS: 4.0000 mg | ORAL_TABLET | Freq: Four times a day (QID) | ORAL | Status: DC | PRN

## 2018-05-24 MED ORDER — INSULIN ASPART 100 UNIT/ML ~~LOC~~ SOLN: 0.0000 [IU] | Freq: Every day | SUBCUTANEOUS | Status: DC

## 2018-05-24 MED ORDER — FUROSEMIDE 10 MG/ML IJ SOLN
20.0000 mg | Freq: Once | INTRAMUSCULAR | Status: AC
  Administered 2018-05-24: 16:00:00 20 mg via INTRAVENOUS
  Filled 2018-05-24: qty 2

## 2018-05-24 MED ORDER — ALBUTEROL SULFATE (2.5 MG/3ML) 0.083% IN NEBU
2.5000 mg | INHALATION_SOLUTION | RESPIRATORY_TRACT | Status: DC | PRN
  Administered 2018-05-25: 10:00:00 2.5 mg via RESPIRATORY_TRACT
  Filled 2018-05-24: qty 3

## 2018-05-24 MED ORDER — MELATONIN 5 MG PO TABS
5.0000 mg | ORAL_TABLET | Freq: Every evening | ORAL | Status: DC | PRN
  Administered 2018-05-24 – 2018-05-26 (×3): 5 mg via ORAL
  Filled 2018-05-24 (×4): qty 1

## 2018-05-24 MED ORDER — LISINOPRIL 10 MG PO TABS
10.0000 mg | ORAL_TABLET | Freq: Every day | ORAL | Status: DC
  Administered 2018-05-25 – 2018-05-27 (×3): 10 mg via ORAL
  Filled 2018-05-24 (×4): qty 1

## 2018-05-24 MED ORDER — ACETAMINOPHEN 650 MG RE SUPP: 650.0000 mg | Freq: Four times a day (QID) | RECTAL | Status: DC | PRN

## 2018-05-24 MED ORDER — SENNOSIDES-DOCUSATE SODIUM 8.6-50 MG PO TABS: 1.0000 | ORAL_TABLET | Freq: Every evening | ORAL | Status: DC | PRN

## 2018-05-24 MED ORDER — LATANOPROST 0.005 % OP SOLN
1.0000 [drp] | Freq: Every day | OPHTHALMIC | Status: DC
  Administered 2018-05-24 – 2018-05-26 (×3): 1 [drp] via OPHTHALMIC
  Filled 2018-05-24: qty 2.5

## 2018-05-24 MED ORDER — ACETAMINOPHEN 325 MG PO TABS
650.0000 mg | ORAL_TABLET | Freq: Once | ORAL | Status: AC
  Filled 2018-05-24: qty 2

## 2018-05-24 MED ORDER — PANTOPRAZOLE SODIUM 40 MG PO TBEC
40.0000 mg | DELAYED_RELEASE_TABLET | Freq: Two times a day (BID) | ORAL | Status: DC
  Administered 2018-05-24 – 2018-05-27 (×7): 40 mg via ORAL
  Filled 2018-05-24 (×8): qty 1

## 2018-05-24 MED ORDER — VENETOCLAX 100 MG TABLET: 400 mg | tablet | Freq: Every day | 3 refills | 0 days | Status: AC

## 2018-05-24 MED ORDER — VENETOCLAX 100 MG TABLET: 400 mg | tablet | Freq: Every day | 0 refills | 0 days | Status: AC

## 2018-05-24 MED ORDER — VENETOCLAX 100 MG TABLET
ORAL_TABLET | Freq: Every day | ORAL | 3 refills | 0.00000 days | Status: CP
Start: 2018-05-24 — End: 2018-05-24
  Filled 2018-05-27: qty 120, 30d supply, fill #0

## 2018-05-24 NOTE — Unmapped (Signed)
I approved this but we actually need to decrease to 100 mg daily since she's on posa. It won't let me edit the rx since I already signed it - can either of you help?

## 2018-05-24 NOTE — Unmapped (Signed)
During a refill coordination, our [facility] pharmacist had noted that the patient was taking full dose venetoclax with a concomitant prescription for posaconazole, despite much education over the dose reduction over the last 1-2 weeks.    I called the patient and spoke with her husband. She is currently admitted in a local hospital. He confirmed that she is taking 4 tablets of venetoclax daily, and that her local oncologist instructed her to NOT start posaconazole, likely due to a high bilirubin. I confirmed with Dr. Lonni Fix that it is ok to resume with the full dose and no concomitant posaconazole until this complication resolves. I will have our pharmacy refill and deliver a 400 mg daily dose supply #120 to ensure the patient does not run out this week. It is likely that she will have to be on posaconazole should her counts remain low, and when this happens, will require a dose adjustment to venetoclax 100 mg daily.     Patient's husband had subsequent questions regarding delivery, signing for packages, contacting courier, a form from the posaconazole manufacturer, etc. that I told him I was unsuited to answer. He should contact appropriate parties if needed.     I would like to note that the complicated dosing of venetoclax with interacting medications, getting medications from both the [facility] (venetoclax) and a manufacturer (posaconazole), being cared for by a local oncologist as well as a Doctor, general practice, and likely low baseline of medical literacy has all been complicating for this patient's care.       Manfred Arch, PharmD, CPP  Pager: [phone]

## 2018-05-24 NOTE — Unmapped (Signed)
[facility]   Patient Onboarding/Medication Counseling    Ms.Alcide Clever is a 77 y.o. 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    Medication: Venclexta 100mg     Verified patient's date of birth / HIPAA.      Education Provided: ?    Dose/Administration discussed: Take 4 tablets by mouth daily. Take with a meal and water. This medication should be taken  Patient is not new to therapy. First filled at [facility]. Husband declined this portion of counseling. .  Stressed the importance of taking medication as prescribed and to contact provider if that changes at any time.  Discussed missed dose instructions.    Storage requirements: this medicine should be stored at room temperature.     Side effects / precautions discussed:  Patient is not new to therapy. First filled at [facility]. Husband declined this portion of counseling.  Patient will receive a drug information handout with shipment.    Handling precautions / disposal reviewed:  Patient was counseled on the hazards surrounding oral chemotherapy and will minimize contact/exposure to the medicine.    Drug Interactions: other medications reviewed and up to date in Epic.  No drug interactions identified.    Comorbidities/Allergies: reviewed and up to date in Epic.    Verified therapy is appropriate and should continue      Delivery Information    Medication Assistance provided: none    Anticipated copay of $0 reviewed with patient. Verified delivery address in Epic.    Scheduled delivery date: 05/28/2018 via next day courier.    Explained that medication will be delivered by courier. and this shipment will require a signature.      Explained the services we provide at [facility] and that each month we would call to set up refills.  Stressed importance of returning phone calls so that we could ensure they receive their medications in time each month.  Informed patient that we should be setting up refills 7-10 days prior to when they will run out of medication.  Informed patient that welcome packet will be sent.      Patient verbalized understanding of the above information as well as how to contact the pharmacy at [phone] option 4 with any questions/concerns.  The pharmacy is open Monday through Friday 8:30am-4:30pm.  A pharmacist is available 24/7 via pager to answer any clinical questions they may have.        Patient Specific Needs      ? Patient has no physical, cognitive, or cultural barriers.    ? Patient prefers to have medications discussed with  Husband     ? Patient is able to read and understand education materials at a high school level or above.    ? Patient's primary language is  English           Aleja Yearwood  Anders Grant  [facility] Specialty Pharmacist

## 2018-05-24 NOTE — Telephone Encounter (Signed)
Patient being admitted to [facility]  Room 107.  Called report to [place] RN @ 11:49.

## 2018-05-24 NOTE — Consult Note (Signed)
77 year old female seen before for a right hand osteomyelitis and infection that has resolved with surgery and antibiotics has had recent onset of extensive left upper extremity and lower extremity bruising and swelling.  She saw Dr. Lucky Cowboy and did not have any arterial blockage or deep vein thrombosis.  On examination the left arm is swollen from proximally 5 cm proximal to the elbow down to the wrist with less swelling to the hand or fingers although there is moderate swelling but extensive ecchymosis there.  With regard the leg she has swelling just above the knee with most of the swelling in the calf posteriorly and swelling into the foot and minimal toe swelling she is able to flex extend the toes and fingers able to make a fist with the hand without significant pain with no evidence of compartment syndrome.  Sensation intact  Impression is presumed spontaneous hemorrhage in both upper and lower extremities related to trauma cytopenia possible muscle tears although she does not recall injury.  With her low platelet count would not take much injury to cause extensive bleeding.  Recommendation is for initially trial of compression I have wrapped her hand to mid forearm and leg to the mid calf to see if just direct compression will allow the edema to resolve if it does go for more extensive compression of both upper and lower extremity.  If it does not resolve we will need to get MRIs to evaluate for possible large deep hematomas that might need to be drained.

## 2018-05-24 NOTE — H&P (Addendum)
[place] [facility] [place] NAME: Julie Holland    MR#:  [id number]  DATE OF BIRTH:  October 06, 1940  DATE OF ADMISSION:  05/24/2018  PRIMARY CARE PHYSICIAN: [name], MD   REQUESTING/REFERRING PHYSICIAN: Dr. Susy Manor.  CHIEF COMPLAINT:  No chief complaint on file.  Left arm and leg ecchymosis and swelling for 2 weeks.  Hemoglobin decreased to 6.2. HISTORY OF PRESENT ILLNESS:  Julie Holland  is a 77 y.o. female with a known history of hypertension, hyperlipidemia, diabetes, GERD, CKD stage I, asthma and CLL.  The patient was sent to ED for direct admission by Dr. Susy Manor due to above chief complaints.  The patient has had left arm and leg swelling with ecchymosis for the past 2 weeks.  Also has some ecchymosis on abdomen and back.  She was found low hemoglobin at 6.2 in Dr. Harlon Flor office.  She denies any melena, bloody stool or hematuria.  She denies any chest pain, shortness of breath, fever or chills.  Dr. Susy Manor wants Dr. Rudene Christians consult.  PAST MEDICAL HISTORY:   Past Medical History:  Diagnosis Date  . Arthritis   . Asthma   . 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  . Cataract   . Chronic kidney disease    stage 1  . Diabetes mellitus 2007   type II  . Dry eye of right side   . GERD (gastroesophageal reflux disease)    OCC  . Glaucoma 2018   RIGHT EYE   . Hemorrhoids   . Hyperlipidemia   . Hypertension     PAST SURGICAL HISTORY:   Past Surgical History:  Procedure Laterality Date  . CATARACT EXTRACTION, BILATERAL    . CHOLECYSTECTOMY    . COLONOSCOPY  02/13/13   Dr Bary Castilla  . EPIGASTRIC HERNIA REPAIR N/A 03/20/2015   Procedure: HERNIA REPAIR EPIGASTRIC ADULT;  Surgeon: Robert Bellow, MD;  Location: [facility] ORS;  Service: General;  Laterality: N/A;  . ESOPHAGOGASTRODUODENOSCOPY N/A 02/19/2018   Procedure: ESOPHAGOGASTRODUODENOSCOPY (EGD);  Surgeon: Lin Landsman, MD;  Location: [facility] ENDOSCOPY;  Service: Gastroenterology;  Laterality: N/A;  . HEMORRHOID SURGERY N/A 03/20/2015    FOCAL HIGH-GRADE SQUAMOUS INTRAEPITHELIAL LESION (HSIL, ANAL /HEMORRHOIDECTOMY;   Robert Bellow, MD [facility] ORS;  : General;  Laterality: N/A;  . HERNIA REPAIR  July 2016   Epigastric hernia, primary repair  . INCISION AND DRAINAGE Right 02/11/2018   Procedure: INCISION AND DRAINAGE- RIGHT HAND;  Surgeon: Hessie Knows, MD;  Location: [facility] ORS;  Service: Orthopedics;  Laterality: Right;  . TONSILLECTOMY  age 48  . TOTAL ABDOMINAL HYSTERECTOMY  01/1989  . TUBAL LIGATION    . 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  Case has been presented at the [facility] tumor board. No indication for additional treatment outside of serial exams  Surgeon: Robert Bellow, MD;  Location: [facility] ORS;  Service: General;  Laterality: N/A;    SOCIAL HISTORY:   Social History   Tobacco Use  . Smoking status: Never Smoker  . Smokeless tobacco: Never Used  . Tobacco comment: smoking cessation materials not required  Substance Use Topics  . Alcohol use: No    FAMILY HISTORY:   Family History  Problem Relation Age of Onset  . Bladder Cancer Mother   . Colon cancer Father   .  Lung cancer Brother   . Lymphoma Sister   . Breast cancer Neg Hx     DRUG ALLERGIES:   Allergies  Allergen Reactions  . Hydrocodone Nausea And Vomiting and Other (See Comments)    Vertigo but patient takes hydrocodone/acetaminophen outpatient    REVIEW OF SYSTEMS:   Review of Systems  Constitutional: Positive for malaise/fatigue. Negative for chills and fever.  HENT: Negative for sore throat.   Eyes: Negative for blurred vision and double vision.  Respiratory: Negative for cough, hemoptysis, shortness of breath, wheezing and stridor.   Cardiovascular: Positive for leg swelling. Negative for chest pain, palpitations  and orthopnea.  Gastrointestinal: Negative for abdominal pain, blood in stool, diarrhea, melena, nausea and vomiting.  Genitourinary: Negative for dysuria, flank pain and hematuria.  Musculoskeletal: Negative for back pain and joint pain.  Skin: Negative for rash.       ecchymosis  Neurological: Negative for dizziness, sensory change, focal weakness, seizures, loss of consciousness, weakness and headaches.  Endo/Heme/Allergies: Negative for polydipsia.  Psychiatric/Behavioral: Negative for depression. The patient is not nervous/anxious.     MEDICATIONS AT HOME:   Prior to Admission medications   Medication Sig Start Date End Date Taking? Authorizing Provider  ACCU-CHEK SMARTVIEW test strip USE AS INSTRUCTED TO CHECK  BLOOD GLUCOSE ONCE DAILY if desired; LON 99 months; Dx E11.9 12/17/17   [name], MD  acetaminophen (TYLENOL) 500 MG tablet Take 500-1,000 mg by mouth every 6 (six) hours as needed (for pain).    [provider]  ADVAIR DISKUS 100-50 MCG/DOSE AEPB USE 1 PUFF TWO TIMES DAILY Patient not taking: Reported on 05/24/2018 05/29/17   Roselee Nova, MD  albuterol (PROVENTIL HFA;VENTOLIN HFA) 108 (90 Base) MCG/ACT inhaler Inhale 2 puffs into the lungs every 6 (six) hours as needed for wheezing or shortness of breath. Patient not taking: Reported on 05/24/2018 10/02/17   Roselee Nova, MD  Carboxymeth-Glycerin-Polysorb (REFRESH OPTIVE ADVANCED OP) Place 1 drop into both eyes 3 (three) times daily as needed (for dry/irritated eyes).    [provider]  carvedilol (COREG) 6.25 MG tablet Take 1 tablet (6.25 mg total) by mouth 2 (two) times daily with a meal. 11/26/17   Lada, Satira Anis, MD  ferrous sulfate 325 (65 FE) MG tablet Take 1 tablet (325 mg total) by mouth daily. Patient not taking: Reported on 05/24/2018 03/01/18   [name], MD  latanoprost (XALATAN) 0.005 % ophthalmic solution Place 1 drop at bedtime into the right eye.    [provider]    levofloxacin (LEVAQUIN) 500 MG tablet Take 500 mg by mouth. 05/18/18   [provider]  lisinopril (PRINIVIL,ZESTRIL) 10 MG tablet TAKE 1 TABLET BY MOUTH  DAILY 04/13/18   [name], MD  metFORMIN (GLUCOPHAGE) 850 MG tablet Take 1 tablet (850 mg total) by mouth 2 (two) times daily with a meal. 03/10/18   Lada, Satira Anis, MD  Multiple Vitamin (MULTIVITAMIN) tablet Take 2 tablets by mouth daily.     [provider]  Nutritional Supplements (ESTROVEN PO) Take 1 tablet by mouth daily.    [provider]  pantoprazole (PROTONIX) 40 MG tablet Take 1 tablet (40 mg total) by mouth 2 (two) times daily before a meal. 02/20/18   Salary, Holly Bodily D, MD  posaconazole (NOXAFIL) 100 MG TBEC delayed-release tablet Take 300 mg by mouth daily.    [provider]  simvastatin (ZOCOR) 20 MG tablet Take 1 tablet (20 mg total)  by mouth at bedtime. 03/01/18   [name], MD      VITAL SIGNS:  Blood pressure (!) 114/50, pulse 89, temperature 98.5 F (36.9 C), temperature source Oral, resp. rate 18, height 5\' 3"  (1.6 m), weight 56.3 kg, SpO2 100 %.  PHYSICAL EXAMINATION:  Physical Exam  GENERAL:  77 y.o.-year-old patient lying in the bed with no acute distress.  EYES: Pupils equal, round, reactive to light and accommodation. No scleral icterus. Extraocular muscles intact.  HEENT: Head atraumatic, normocephalic. Oropharynx and nasopharynx clear.  NECK:  Supple, no jugular venous distention. No thyroid enlargement, no tenderness.  LUNGS: Normal breath sounds bilaterally, no wheezing, rales,rhonchi or crepitation. No use of accessory muscles of respiration.  CARDIOVASCULAR: S1, S2 normal. No murmurs, rubs, or gallops.  ABDOMEN: Soft, nontender, nondistended. Bowel sounds present. No organomegaly or mass.  EXTREMITIES: Left arm and leg swelling with ecchymosis. no cyanosis, or clubbing.  NEUROLOGIC: Cranial nerves II through XII are intact. Muscle strength 5/5 in all  extremities. Sensation intact. Gait not checked.  PSYCHIATRIC: The patient is alert and oriented x 3.  SKIN: No obvious rash, lesion, or ulcer. ecchymosis most on left arm and leg, on lower abdomen and buttock.  LABORATORY PANEL:   CBC Recent Labs  Lab 05/24/18 0806  WBC 9.5  HGB 6.2*  HCT 19.0*  PLT 35*   ------------------------------------------------------------------------------------------------------------------  Chemistries  Recent Labs  Lab 05/24/18 0806  NA 130*  K 3.8  CL 94*  CO2 28  GLUCOSE 197*  BUN 20  CREATININE 0.75  CALCIUM 8.2*  MG 1.4*  AST 44*  ALT 22  ALKPHOS 86  BILITOT 3.0*   ------------------------------------------------------------------------------------------------------------------  Cardiac Enzymes No results for input(s): TROPONINI in the last 168 hours. ------------------------------------------------------------------------------------------------------------------  RADIOLOGY:  No results found.    IMPRESSION AND PLAN:   Anemia with bruises and possible hematoma of left arm and leg. The patient will be admitted to medical floor. Radiated PRBC transfusion at least 2 unit per Dr. Susy Manor.  Follow-up hemoglobin.  Orthopedic consult. Left arm venous ultrasound was negative for DVT 3 days ago. Check left leg venous duplex.  Elevated bilirubin.  Possible due to hemolysis secondary to hematoma.  Follow-up level.  Hypomagnesemia.  Magnesium IV in the follow-up level. CLL.  Follow-up with Dr. Susy Manor.  Diabetes.  Start sliding scale. Hypertension.  Hold hypertension medication if blood pressure is low.  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  Follow-up level.  Platelet transfusion PRN per Dr. Susy Manor.  Discussed with Dr. Susy Manor and Dr. Rudene Christians. All the records are reviewed and case discussed with ED provider. Management plans discussed with the patient, family and they are in agreement.  CODE STATUS: DNR.  TOTAL TIME TAKING  CARE OF THIS PATIENT: 45 minutes.    Demetrios Loll M.D on 05/24/2018 at 1:12 PM  Between 7am to 6pm - Pager - [phone]  After 6pm go to [email or web address] - Proofreader  [facility]  Office  [phone]  CC: Primary care physician; [name], MD   Note: This dictation was prepared with Dragon dictation along with smaller phrase technology. Any transcriptional errors that result from this process are unin

## 2018-05-24 NOTE — Progress Notes (Signed)
Here for follow up. Per pt overall doing" ok" she stated  Left leg w one pulse pitting edema-very " tight feeling " per pt and skin noted to be shiny and taut . L arm esp hand dark purple w pt stating she has been putting heat on it and keeping it elevated.  Arm elevated on a pillow at this time. Pt stated " Dr Lucky Cowboy told me to take tylenol ,put heat on and it will take time to heal"

## 2018-05-24 NOTE — Progress Notes (Signed)
Advanced Care Plan.  Purpose of Encounter: CODE STATUS. Parties in Attendance: The patient, RN and me Patient's Decisional Capacity: Yes. Medical Story: Julie Holland  is a 77 y.o. female with a known history of hypertension, hyperlipidemia, diabetes, GERD, CKD stage I, asthma and CLL.    The patient is being admitted for Anemia with bruises and possible hematoma of left arm and leg.  Discussed with patient about patient current condition, prognosis and CODE STATUS.  The patient stated that she does not want to be resuscitated or intubated. Plan:  Code Status: DNR. Time spent discussing advance care planning: 16-17 minutes.

## 2018-05-24 NOTE — Consult Note (Signed)
[facility]  Date of admission:  05/24/2018  Inpatient day:  05/24/2018  Consulting physician:  Dr. Demetrios Loll  Reason for Consultation:  Anemia: AML  Chief Complaint: Julie Holland is a 77 y.o. 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  She received cycle #1 azacytidine and venetoclax beginning 04/23/2018 at [facility].  She has been transfusion dependent.  She was seen in the clinic on 05/21/2018.  At that time, she had moderate swelling and ecchymosis in the let arm.  She was seen by Dr. Lucky Cowboy.  Non-invasive studies were negative.  She was felt to possibly have developed a muscle pull or tear during physical therapy with muscular and subcutaneous bruising secondary to thrombocytopenia.  Hemoglobin was 10.9 and platelets 36,000.  Over the weekend, she developed progressive swelling of the left upper and lower extremity.  Exam revealed significant increase in edema with associated ecchymosis.  Her hand was purple with sparing of her fingertips.  Hemoglobin had dropped to 6.2.  She is jaundiced.  Decision was made to admit to the hospital for evaluation.  Dr. Rudene Christians was contacted.  Hemolysis work-up initiated. Transfusion of 2 units of PRBCs scheduled.  Chemotherapy held.  Left lower extremity duplex today revealed no evidence of DVT.  She has been seen by Dr. Rudene Christians.  Left upper and lower extremity have been partially wrapped.   Past Medical History:  Diagnosis Date  . Arthritis   . Asthma   . 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  . Cataract   . Chronic kidney disease    stage 1  . Diabetes mellitus 2007   type II  . Dry eye of right side   . GERD (gastroesophageal reflux disease)    OCC  . Glaucoma 2018   RIGHT EYE   . Hemorrhoids   . Hyperlipidemia   . Hypertension     Past Surgical History:  Procedure Laterality Date  . CATARACT EXTRACTION, BILATERAL    . CHOLECYSTECTOMY    . COLONOSCOPY  02/13/13   Dr Bary Castilla  . EPIGASTRIC HERNIA REPAIR N/A 03/20/2015   Procedure: HERNIA REPAIR EPIGASTRIC ADULT;  Surgeon: Robert Bellow, MD;  Location: [facility] ORS;  Service: General;  Laterality: N/A;  . ESOPHAGOGASTRODUODENOSCOPY N/A 02/19/2018   Procedure: ESOPHAGOGASTRODUODENOSCOPY (EGD);  Surgeon: Lin Landsman, MD;  Location: [facility] ENDOSCOPY;  Service: Gastroenterology;  Laterality: N/A;  . HEMORRHOID SURGERY N/A 03/20/2015    FOCAL HIGH-GRADE SQUAMOUS INTRAEPITHELIAL LESION (HSIL, ANAL /HEMORRHOIDECTOMY;   Robert Bellow, MD [facility] ORS;  : General;  Laterality: N/A;  . HERNIA REPAIR  July 2016   Epigastric hernia, primary repair  . INCISION AND DRAINAGE Right 02/11/2018   Procedure: INCISION AND DRAINAGE- RIGHT HAND;  Surgeon: Hessie Knows, MD;  Location: [facility] ORS;  Service: Orthopedics;  Laterality: Right;  . TONSILLECTOMY  age 29  . TOTAL ABDOMINAL HYSTERECTOMY  01/1989  . TUBAL LIGATION    . 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  Case has been [facility] at [facility] board. No indication for additional treatment outside of serial exams  Surgeon: Robert Bellow, MD;  Location: [facility] ORS;  Service: General;  Laterality: N/A;    Family History  Problem Relation Age of Onset  . Bladder Cancer Mother   . Colon cancer Father   . Lung cancer Brother   . Lymphoma Sister   . Breast cancer Neg Hx     Social History:  reports that she has never smoked. She has never used smokeless tobacco. She reports that she does not drink alcohol or use drugs.  The patient is accompanied by her husband, [name].  Allergies:  Allergies  Allergen Reactions  . Hydrocodone Nausea And Vomiting and Other (See  Comments)    Vertigo but patient takes hydrocodone/acetaminophen outpatient    Medications Prior to Admission  Medication Sig Dispense Refill  . VENCLEXTA 100 MG TABS Take 100 mg by mouth daily.    Marland Kitchen ACCU-CHEK SMARTVIEW test strip USE AS INSTRUCTED TO CHECK  BLOOD GLUCOSE ONCE DAILY if desired; LON 99 months; Dx E11.9 100 each 1  . acetaminophen (TYLENOL) 500 MG tablet Take 500-1,000 mg by mouth every 6 (six) hours as needed (for pain).    . ADVAIR DISKUS 100-50 MCG/DOSE AEPB USE 1 PUFF TWO TIMES DAILY (Patient not taking: Reported on 05/24/2018) 3 each 2  . albuterol (PROVENTIL HFA;VENTOLIN HFA) 108 (90 Base) MCG/ACT inhaler Inhale 2 puffs into the lungs every 6 (six) hours as needed for wheezing or shortness of breath. (Patient not taking: Reported on 05/24/2018) 1 Inhaler 2  . Carboxymeth-Glycerin-Polysorb (REFRESH OPTIVE ADVANCED OP) Place 1 drop into both eyes 3 (three) times daily as needed (for dry/irritated eyes).    . carvedilol (COREG) 6.25 MG tablet Take 1 tablet (6.25 mg total) by mouth 2 (two) times daily with a meal. 180 tablet 1  . ferrous sulfate 325 (65 FE) MG tablet Take 1 tablet (325 mg total) by mouth daily. (Patient not taking: Reported on 05/24/2018)    . latanoprost (XALATAN) 0.005 % ophthalmic solution Place 1 drop at bedtime into the right eye.    Marland Kitchen levofloxacin (LEVAQUIN) 500 MG tablet Take 500 mg by mouth.    Marland Kitchen lisinopril (PRINIVIL,ZESTRIL) 10 MG tablet TAKE 1 TABLET BY MOUTH  DAILY 90 tablet 1  . metFORMIN (GLUCOPHAGE) 850 MG tablet Take 1 tablet (850 mg total) by mouth 2 (two) times daily with a meal. 180 tablet 0  . Multiple Vitamin (MULTIVITAMIN) tablet Take 2 tablets by mouth daily.     . Nutritional Supplements (ESTROVEN PO) Take 1 tablet by mouth daily.    . pantoprazole (PROTONIX) 40 MG tablet Take 1 tablet (40 mg total) by mouth 2 (two) times daily before a meal. 180 tablet 0  . posaconazole (NOXAFIL) 100 MG TBEC delayed-release tablet Take 300 mg by mouth daily.     . simvastatin (ZOCOR) 20 MG tablet Take 1 tablet (20 mg total) by mouth at bedtime. 90 tablet 0    Review of Systems: GENERAL:  Fatigue.  No fevers, sweats.  Weight up 3 pounds since 05/21/2018 (fluid weight). PERFORMANCE STATUS (ECOG):  2-3 HEENT:  No visual changes, runny nose, sore throat, mouth sores or tenderness. Lungs: No shortness of breath or cough.  No hemoptysis. Cardiac:  No chest pain, palpitations, orthopnea, or PND. GI:  No nausea, vomiting, diarrhea, constipation, melena or hematochezia. GU:  No urgency, frequency, dysuria, or hematuria. Musculoskeletal:  Left arm and hand achy.  Denies fall or trauma. Extremities:  Increased left arm and hand swelling.  Arm and hand purple.  Increased left lower extremity swelling. Skin:  Jaundice.  No rashes or skin  changes. Neuro:  Generalized weakness.  Sensation intact in left hand.  No headache, numbness or weakness, balance or coordination issues. Endocrine:  Diabetes.  No thyroid issues, hot flashes or night sweats. Psych:  No mood changes, depression or anxiety. Pain:  Left arm and hand pain. Review of systems:  All other systems reviewed and found to be negative.  Physical Exam:  Blood pressure (!) 123/53, pulse 90, temperature 98.3 F (36.8 C), temperature source Oral, resp. rate 18, height 5\' 3"  (1.6 m), weight 124 lb 1.6 oz (56.3 kg), SpO2 100 %.  GENERAL:  Thin elderly woman sitting comfortably on the medical unit in no acute distress. MENTAL STATUS:  Alert and oriented to person, place and time. HEAD:  Short gray hair.  Normocephalic, atraumatic, face symmetric, no Cushingoid features. EYES:  Brown eyes.  Pupils equal round and reactive to light and accomodation.  Scleral icterus. ENT:  Oropharynx clear without lesion.  Tongue normal. Mucous membranes moist.  RESPIRATORY:  Clear to auscultation without rales, wheezes or rhonchi. CARDIOVASCULAR:  Regular rate and rhythm without murmur, rub or gallop. ABDOMEN:  Soft,  non-tender, with active bowel sounds, and no hepatosplenomegaly.  No masses. SKIN:  Jaundice.  No rashes, ulcers or lesions. EXTREMITIES: Marked left upper extremity edema with associated ecchymosis.  Hand light purple with tips of fingers spared.  Mobility and sensation intact.  Marked tense edema from left thigh distally.  Distal ecchymosis. LYMPH NODES: No palpable cervical, supraclavicular, axillary or inguinal adenopathy  NEUROLOGICAL: Unremarkable. PSYCH:  Appropriate.   Results for orders placed or performed during the hospital encounter of 05/24/18 (from the past 48 hour(s))  Glucose, capillary     Status: None   Collection Time: 05/24/18 12:37 PM  Result Value Ref Range   Glucose-Capillary 91 70 - 99 mg/dL  Prepare RBC     Status: None   Collection Time: 05/24/18  1:27 PM  Result Value Ref Range   Order Confirmation      DUPLICATE Performed at [facility], [address] [place], [place] [address]   Glucose, capillary     Status: Abnormal   Collection Time: 05/24/18  4:49 PM  Result Value Ref Range   Glucose-Capillary 110 (H) 70 - 99 mg/dL   US Venous Img Lower Unilateral Left  Result Date: 05/24/2018 CLINICAL DATA:  77 year old female with a history of swelling EXAM: LEFT LOWER EXTREMITY VENOUS DOPPLER ULTRASOUND TECHNIQUE: Gray-scale sonography with graded compression, as well as color Doppler and duplex ultrasound were performed to evaluate the lower extremity deep venous systems from the level of the common femoral vein and including the common femoral, femoral, profunda femoral, popliteal and calf veins including the posterior tibial, peroneal and gastrocnemius veins when visible. The superficial great saphenous vein was also interrogated. Spectral Doppler was utilized to evaluate flow at rest and with distal augmentation maneuvers in the common femoral, femoral and popliteal veins. COMPARISON:  None. FINDINGS: Contralateral Common Femoral Vein: Respiratory  phasicity is normal and symmetric with the symptomatic side. No evidence of thrombus. Normal compressibility. Common Femoral Vein: No evidence of thrombus. Normal compressibility, respiratory phasicity and response to augmentation. Saphenofemoral Junction: No evidence of thrombus. Normal compressibility and flow on color Doppler imaging. Profunda Femoral Vein: No evidence of thrombus. Normal compressibility and flow on color Doppler imaging. Femoral Vein: No evidence of thrombus. Normal compressibility, respiratory phasicity and response to augmentation. Popliteal Vein: No evidence of thrombus. Normal compressibility, respiratory phasicity and response to augmentation. Calf Veins: Limited visualization  of the tibial veins Superficial Great Saphenous Vein: No evidence of thrombus. Normal compressibility and flow on color Doppler imaging. Other Findings:  Edema IMPRESSION: Sonographic survey of the left lower extremity negative for DVT Edema Electronically Signed   By: Corrie Mckusick D.O.   On: 05/24/2018 15:52    Assessment:  The patient is a 77 y.o. 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    She has marked anemia with a rapid decline in hemoglobin over the past 3 days worrisome for bleeding or hemolysis.  She has mild hyperbilirubinemia.  Plan:   1.  Oncology:   Patient is day 15 s/p cycle #1 azacytidine and venetoclax.    Cycle #2 was postponed today secondary to current events.    Recent blast count has increased to 65% today.  2.  Hematology:    Patient is transfusion dependent.  Blood products are leukopoor and irradiated.  Transfuse 2 units irradiated PRBCs today.  Evaluation for hemolysis:  Coombs negative.  Haptoglobin pending.  Left lower extremity duplex negative today.  Follow daily CBC.   3.  Orthopedics:    Patient has marked left upper and left lower extremity ecchymosis and swelling first noted on 05/21/2018.   Evaluation by vascular surgery negative.    Appreciate orthopedics consult today.  Etiology may be secondary to spontaneous hemorrhage with muscle tear.    Consider increasing platelet transfusion threshold (currently 20,000).  Currently undergoing trial of compression.  Consideration is being made for MRIs to evaluate for a deep hematoma if does not resolve.  4.  Code status:  DNR.   Thank you for allowing me to participate in BELVIA GOTSCHALL 's care.  I will follow her closely with you while hospitalized and after discharge in the outpatient department.   Lequita Asal, MD  05/24/2018, 8:35 PM

## 2018-05-25 ENCOUNTER — Inpatient Hospital Stay: Payer: Medicare Other

## 2018-05-25 ENCOUNTER — Encounter: Payer: Self-pay | Admitting: Hematology and Oncology

## 2018-05-25 ENCOUNTER — Telehealth: Payer: Self-pay | Admitting: *Deleted

## 2018-05-25 DIAGNOSIS — M1990 Unspecified osteoarthritis, unspecified site: Secondary | ICD-10-CM | POA: Diagnosis not present

## 2018-05-25 DIAGNOSIS — N189 Chronic kidney disease, unspecified: Secondary | ICD-10-CM | POA: Diagnosis not present

## 2018-05-25 DIAGNOSIS — K2970 Gastritis, unspecified, without bleeding: Secondary | ICD-10-CM | POA: Diagnosis not present

## 2018-05-25 DIAGNOSIS — Z66 Do not resuscitate: Secondary | ICD-10-CM

## 2018-05-25 DIAGNOSIS — D61818 Other pancytopenia: Secondary | ICD-10-CM | POA: Diagnosis not present

## 2018-05-25 DIAGNOSIS — K259 Gastric ulcer, unspecified as acute or chronic, without hemorrhage or perforation: Secondary | ICD-10-CM | POA: Diagnosis not present

## 2018-05-25 DIAGNOSIS — Z79899 Other long term (current) drug therapy: Secondary | ICD-10-CM | POA: Diagnosis not present

## 2018-05-25 DIAGNOSIS — J45909 Unspecified asthma, uncomplicated: Secondary | ICD-10-CM | POA: Diagnosis not present

## 2018-05-25 DIAGNOSIS — Z7984 Long term (current) use of oral hypoglycemic drugs: Secondary | ICD-10-CM | POA: Diagnosis not present

## 2018-05-25 DIAGNOSIS — R609 Edema, unspecified: Secondary | ICD-10-CM

## 2018-05-25 DIAGNOSIS — R531 Weakness: Secondary | ICD-10-CM | POA: Diagnosis not present

## 2018-05-25 DIAGNOSIS — C9200 Acute myeloblastic leukemia, not having achieved remission: Secondary | ICD-10-CM | POA: Diagnosis not present

## 2018-05-25 DIAGNOSIS — K219 Gastro-esophageal reflux disease without esophagitis: Secondary | ICD-10-CM | POA: Diagnosis not present

## 2018-05-25 DIAGNOSIS — E1122 Type 2 diabetes mellitus with diabetic chronic kidney disease: Secondary | ICD-10-CM | POA: Diagnosis not present

## 2018-05-25 DIAGNOSIS — R600 Localized edema: Secondary | ICD-10-CM | POA: Insufficient documentation

## 2018-05-25 DIAGNOSIS — I129 Hypertensive chronic kidney disease with stage 1 through stage 4 chronic kidney disease, or unspecified chronic kidney disease: Secondary | ICD-10-CM | POA: Diagnosis not present

## 2018-05-25 DIAGNOSIS — E785 Hyperlipidemia, unspecified: Secondary | ICD-10-CM | POA: Diagnosis not present

## 2018-05-25 LAB — CBC
HCT: 20.8 % — ABNORMAL LOW (ref 35.0–47.0)
Hemoglobin: 7.1 g/dL — ABNORMAL LOW (ref 12.0–16.0)
MCH: 30.8 pg (ref 26.0–34.0)
MCHC: 34.3 g/dL (ref 32.0–36.0)
MCV: 90 fL (ref 80.0–100.0)
Platelets: 26 10*3/uL — CL (ref 150–440)
RBC: 2.31 MIL/uL — ABNORMAL LOW (ref 3.80–5.20)
RDW: 16.1 % — ABNORMAL HIGH (ref 11.5–14.5)
WBC: 8.8 10*3/uL (ref 3.6–11.0)

## 2018-05-25 LAB — BASIC METABOLIC PANEL
Anion gap: 6 (ref 5–15)
BUN: 19 mg/dL (ref 8–23)
CO2: 29 mmol/L (ref 22–32)
Calcium: 7.6 mg/dL — ABNORMAL LOW (ref 8.9–10.3)
Chloride: 93 mmol/L — ABNORMAL LOW (ref 98–111)
Creatinine, Ser: 0.55 mg/dL (ref 0.44–1.00)
GFR calc Af Amer: >60 mL/min (ref >60)
GFR calc non Af Amer: >60 mL/min (ref >60)
Glucose, Bld: 149 mg/dL — ABNORMAL HIGH (ref 70–99)
Potassium: 3.2 mmol/L — ABNORMAL LOW (ref 3.5–5.1)
Sodium: 128 mmol/L — ABNORMAL LOW (ref 135–145)

## 2018-05-25 LAB — GLUCOSE, CAPILLARY
Glucose-Capillary: 144 mg/dL — ABNORMAL HIGH (ref 70–99)
Glucose-Capillary: 139 mg/dL — ABNORMAL HIGH (ref 70–99)
Glucose-Capillary: 128 mg/dL — ABNORMAL HIGH (ref 70–99)
Glucose-Capillary: 150 mg/dL — ABNORMAL HIGH (ref 70–99)
Glucose-Capillary: 144 mg/dL — ABNORMAL HIGH (ref 70–99)

## 2018-05-25 LAB — PREPARE RBC (CROSSMATCH)

## 2018-05-25 LAB — PLATELET COUNT: Platelets: 63 10*3/uL — ABNORMAL LOW (ref 150–440)

## 2018-05-25 LAB — MAGNESIUM: Magnesium: 1.4 mg/dL — ABNORMAL LOW (ref 1.7–2.4)

## 2018-05-25 LAB — HEMOGLOBIN AND HEMATOCRIT, BLOOD
HCT: 20.5 % — ABNORMAL LOW (ref 35.0–47.0)
Hemoglobin: 7.1 g/dL — ABNORMAL LOW (ref 12.0–16.0)

## 2018-05-25 MED ORDER — MORPHINE SULFATE (PF) 2 MG/ML IV SOLN: 2.0000 mg | INTRAVENOUS | Status: DC | PRN

## 2018-05-25 MED ORDER — POTASSIUM CHLORIDE CRYS ER 20 MEQ PO TBCR
40.0000 meq | EXTENDED_RELEASE_TABLET | Freq: Once | ORAL | Status: AC
  Administered 2018-05-25: 08:00:00 40 meq via ORAL
  Filled 2018-05-25: qty 2

## 2018-05-25 MED ORDER — DIPHENHYDRAMINE HCL 25 MG PO CAPS
25.0000 mg | ORAL_CAPSULE | Freq: Once | ORAL | Status: AC
  Administered 2018-05-25: 25 mg via ORAL
  Filled 2018-05-25: qty 1

## 2018-05-25 MED ORDER — HEPARIN SOD (PORK) LOCK FLUSH 100 UNIT/ML IV SOLN: 250.0000 [IU] | INTRAVENOUS | Status: DC | PRN

## 2018-05-25 MED ORDER — SODIUM CHLORIDE 0.9% FLUSH: 3.0000 mL | INTRAVENOUS | Status: DC | PRN

## 2018-05-25 MED ORDER — VENETOCLAX 100 MG PO TABS
400.0000 mg | ORAL_TABLET | Freq: Every day | ORAL | Status: DC
  Administered 2018-05-25 – 2018-05-27 (×3): 400 mg via ORAL
  Filled 2018-05-25 (×3): qty 4

## 2018-05-25 MED ORDER — SODIUM CHLORIDE 0.9% FLUSH: 10.0000 mL | INTRAVENOUS | Status: DC | PRN

## 2018-05-25 MED ORDER — SODIUM CHLORIDE 0.9% IV SOLUTION
250.0000 mL | Freq: Once | INTRAVENOUS | Status: AC
  Administered 2018-05-25: 250 mL via INTRAVENOUS

## 2018-05-25 MED ORDER — TRAMADOL HCL 50 MG PO TABS
50.0000 mg | ORAL_TABLET | Freq: Four times a day (QID) | ORAL | Status: DC | PRN
  Administered 2018-05-25 – 2018-05-27 (×3): 50 mg via ORAL
  Filled 2018-05-25 (×3): qty 1

## 2018-05-25 MED ORDER — ACETAMINOPHEN 325 MG PO TABS
650.0000 mg | ORAL_TABLET | Freq: Once | ORAL | Status: AC
  Administered 2018-05-25: 650 mg via ORAL
  Filled 2018-05-25: qty 2

## 2018-05-25 MED ORDER — MAGNESIUM SULFATE 2 GM/50ML IV SOLN
2.0000 g | Freq: Once | INTRAVENOUS | Status: AC
  Administered 2018-05-25: 13:00:00 2 g via INTRAVENOUS
  Filled 2018-05-25: qty 50

## 2018-05-25 MED ORDER — SODIUM CHLORIDE 0.9% IV SOLUTION
Freq: Once | INTRAVENOUS | Status: AC
  Administered 2018-05-25: 10:00:00 via INTRAVENOUS

## 2018-05-25 MED ORDER — HEPARIN SOD (PORK) LOCK FLUSH 100 UNIT/ML IV SOLN: 500.0000 [IU] | Freq: Every day | INTRAVENOUS | Status: DC | PRN

## 2018-05-25 NOTE — Progress Notes (Signed)
[facility] NAME: Julie Holland    MR#:  [id number]  DATE OF BIRTH:  1940-12-26  SUBJECTIVE:   patient here with anemia  Patient fell a few weeks ago on her left side and since then she is been having bruising.  REVIEW OF SYSTEMS:     Review of Systems  Constitutional: Negative for fever, chills weight loss HENT: Negative for ear pain, nosebleeds, congestion, facial swelling, rhinorrhea, neck pain, neck stiffness and ear discharge.   Respiratory: Negative for cough, shortness of breath, wheezing  Cardiovascular: Negative for chest pain, palpitations and leg swelling.  Gastrointestinal: Negative for heartburn, abdominal pain, vomiting, diarrhea or consitpation Genitourinary: Negative for dysuria, urgency, frequency, hematuria Musculoskeletal: Negative for back pain or joint pain Neurological: Negative for dizziness, seizures, syncope, focal weakness,  numbness and headaches.  Hematological: Does not bruise/bleed easily.  Psychiatric/Behavioral: Negative for hallucinations, confusion, dysphoric mood  Skin: Ecchymosis left hand and leg and back With swelling  Tolerating Diet: yes      DRUG ALLERGIES:   Allergies  Allergen Reactions  . Hydrocodone Nausea And Vomiting and Other (See Comments)    Vertigo but patient takes hydrocodone/acetaminophen outpatient    VITALS:  Blood pressure 133/60, pulse 86, temperature 99.1 F (37.3 C), temperature source Oral, resp. rate 20, height 5\' 3"  (1.6 m), weight 56.3 kg, SpO2 100 %.  PHYSICAL EXAMINATION:  Constitutional: Appears well-developed and well-nourished. No distress. HENT: Normocephalic. Marland Kitchen Oropharynx is clear and moist.  Eyes: Conjunctivae and EOM are normal. PERRLA, no scleral icterus.  Neck: Normal ROM. Neck supple. No JVD. No tracheal deviation. CVS: RRR, S1/S2 +, no murmurs, no gallops, no carotid bruit.  Pulmonary: Effort and breath sounds normal, no stridor, rhonchi,  wheezes, rales.  Abdominal: Soft. BS +,  no distension, tenderness, rebound or guarding.  Musculoskeletal: Normal range of motion. No edema and no tenderness.  Neuro: Alert. CN 2-12 grossly intact. No focal deficits. Skin: Ecchymosis left hand/arm and extends through left leg and back   psychiatric: Normal mood and affect.      LABORATORY PANEL:   CBC Recent Labs  Lab 05/25/18 0459  WBC 8.8  HGB 7.1*  HCT 20.8*  PLT 26*   ------------------------------------------------------------------------------------------------------------------  Chemistries  Recent Labs  Lab 05/24/18 0806 05/25/18 0459  NA 130* 128*  K 3.8 3.2*  CL 94* 93*  CO2 28 29  GLUCOSE 197* 149*  BUN 20 19  CREATININE 0.75 0.55  CALCIUM 8.2* 7.6*  MG 1.4* 1.4*  AST 44*  --   ALT 22  --   ALKPHOS 86  --   BILITOT 3.0*  --    ------------------------------------------------------------------------------------------------------------------  Cardiac Enzymes No results for input(s): TROPONINI in the last 168 hours. ------------------------------------------------------------------------------------------------------------------  RADIOLOGY:  US Venous Img Lower Unilateral Left  Result Date: 05/24/2018 CLINICAL DATA:  77 year old female with a history of swelling EXAM: LEFT LOWER EXTREMITY VENOUS DOPPLER ULTRASOUND TECHNIQUE: Gray-scale sonography with graded compression, as well as color Doppler and duplex ultrasound were performed to evaluate the lower extremity deep venous systems from the level of the common femoral vein and including the common femoral, femoral, profunda femoral, popliteal and calf veins including the posterior tibial, peroneal and gastrocnemius veins when visible. The superficial great saphenous vein was also interrogated. Spectral Doppler was utilized to evaluate flow at rest and with distal augmentation maneuvers in the common femoral, femoral and popliteal veins. COMPARISON:  None.  FINDINGS: Contralateral Common Femoral Vein: Respiratory phasicity  is normal and symmetric with the symptomatic side. No evidence of thrombus. Normal compressibility. Common Femoral Vein: No evidence of thrombus. Normal compressibility, respiratory phasicity and response to augmentation. Saphenofemoral Junction: No evidence of thrombus. Normal compressibility and flow on color Doppler imaging. Profunda Femoral Vein: No evidence of thrombus. Normal compressibility and flow on color Doppler imaging. Femoral Vein: No evidence of thrombus. Normal compressibility, respiratory phasicity and response to augmentation. Popliteal Vein: No evidence of thrombus. Normal compressibility, respiratory phasicity and response to augmentation. Calf Veins: Limited visualization of the tibial veins Superficial Great Saphenous Vein: No evidence of thrombus. Normal compressibility and flow on color Doppler imaging. Other Findings:  Edema IMPRESSION: Sonographic survey of the left lower extremity negative for DVT Edema Electronically Signed   By: Corrie Mckusick D.O.   On: 05/24/2018 15:52     ASSESSMENT AND PLAN:   77 year old female with AML who was admitted through oncology clinic with a drop in hemoglobin.  1.  Acute on chronic anemia due to large ecchymosis from spontaneous hemorrhage to left upper and lower extremity Patient is status post 2 units PRBC and will receive 1 more unit today Lower extremity Dopplers were negative 2.  AML: Management as per oncology  3.  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 Mike Gip   4.  Essential hypertension: Continue lisinopril and Coreg  5.  Diabetes: Continue sliding scale 6.  Electrolyte: Replete as needed  7.  Chronic kidney disease stage I: Creatinine at baseline Management plans discussed with the patient and she is in agreement.  CODE STATUS: DNR  TOTAL TIME TAKING CARE OF THIS PATIENT: 30 minutes.      POSSIBLE D/C today or tomorrow, DEPENDING ON CLINICAL CONDITION.   Julie Holland M.D on 05/25/2018 at 11:20 AM  Between 7am to 6pm - Pager - [phone] After 6pm go to [email or web address] - password [email or web address] [facility]  Office  [phone]  CC: Primary care physician; [name], MD  Note: This dictation was prepared with Dragon dictation along with smaller phrase technology. Any transcriptional errors that result from this process are unintentional.

## 2018-05-25 NOTE — Progress Notes (Signed)
PT Cancellation Note  Patient Details Name: Julie Holland MRN: [id number] DOB: [birth year]   Cancelled Treatment:    Reason Eval/Treat Not Completed: Other (comment). Consult received and chart reviewed. Pt currently receiving blood transfusion at this time. Will re-attempt at next available time.   Bradley Handyside 05/25/2018, 9:54 AM Greggory Stallion, PT, DPT [phone]
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
## 2018-05-25 NOTE — Telephone Encounter (Signed)
Patient husband states he brought in patient oral chemotherapy but the nurse are not giving it to her because they do not have an order to do so. Please wrtie orders to administer her oral chemotherapy
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
## 2018-05-26 ENCOUNTER — Other Ambulatory Visit: Payer: Self-pay | Admitting: Urgent Care

## 2018-05-26 ENCOUNTER — Inpatient Hospital Stay: Payer: Medicare Other

## 2018-05-26 DIAGNOSIS — E785 Hyperlipidemia, unspecified: Secondary | ICD-10-CM | POA: Diagnosis not present

## 2018-05-26 DIAGNOSIS — Z79899 Other long term (current) drug therapy: Secondary | ICD-10-CM | POA: Diagnosis not present

## 2018-05-26 DIAGNOSIS — K2970 Gastritis, unspecified, without bleeding: Secondary | ICD-10-CM | POA: Diagnosis not present

## 2018-05-26 DIAGNOSIS — E1122 Type 2 diabetes mellitus with diabetic chronic kidney disease: Secondary | ICD-10-CM | POA: Diagnosis not present

## 2018-05-26 DIAGNOSIS — C9200 Acute myeloblastic leukemia, not having achieved remission: Secondary | ICD-10-CM | POA: Diagnosis not present

## 2018-05-26 DIAGNOSIS — N189 Chronic kidney disease, unspecified: Secondary | ICD-10-CM | POA: Diagnosis not present

## 2018-05-26 DIAGNOSIS — K219 Gastro-esophageal reflux disease without esophagitis: Secondary | ICD-10-CM | POA: Diagnosis not present

## 2018-05-26 DIAGNOSIS — R531 Weakness: Secondary | ICD-10-CM | POA: Diagnosis not present

## 2018-05-26 DIAGNOSIS — K259 Gastric ulcer, unspecified as acute or chronic, without hemorrhage or perforation: Secondary | ICD-10-CM | POA: Diagnosis not present

## 2018-05-26 DIAGNOSIS — I129 Hypertensive chronic kidney disease with stage 1 through stage 4 chronic kidney disease, or unspecified chronic kidney disease: Secondary | ICD-10-CM | POA: Diagnosis not present

## 2018-05-26 DIAGNOSIS — D61818 Other pancytopenia: Secondary | ICD-10-CM | POA: Diagnosis not present

## 2018-05-26 DIAGNOSIS — D696 Thrombocytopenia, unspecified: Secondary | ICD-10-CM

## 2018-05-26 DIAGNOSIS — Z7984 Long term (current) use of oral hypoglycemic drugs: Secondary | ICD-10-CM | POA: Diagnosis not present

## 2018-05-26 DIAGNOSIS — J45909 Unspecified asthma, uncomplicated: Secondary | ICD-10-CM | POA: Diagnosis not present

## 2018-05-26 DIAGNOSIS — M1990 Unspecified osteoarthritis, unspecified site: Secondary | ICD-10-CM | POA: Diagnosis not present

## 2018-05-26 LAB — CBC
HCT: 15.6 % — ABNORMAL LOW (ref 35.0–47.0)
Hemoglobin: 5.4 g/dL — ABNORMAL LOW (ref 12.0–16.0)
MCH: 31.8 pg (ref 26.0–34.0)
MCHC: 34.4 g/dL (ref 32.0–36.0)
MCV: 92.5 fL (ref 80.0–100.0)
Platelets: 55 10*3/uL — ABNORMAL LOW (ref 150–440)
RBC: 1.69 MIL/uL — ABNORMAL LOW (ref 3.80–5.20)
RDW: 16.1 % — ABNORMAL HIGH (ref 11.5–14.5)
WBC: 12.4 10*3/uL — ABNORMAL HIGH (ref 3.6–11.0)

## 2018-05-26 LAB — BASIC METABOLIC PANEL
Anion gap: 6 (ref 5–15)
BUN: 18 mg/dL (ref 8–23)
CO2: 29 mmol/L (ref 22–32)
Calcium: 7.6 mg/dL — ABNORMAL LOW (ref 8.9–10.3)
Chloride: 92 mmol/L — ABNORMAL LOW (ref 98–111)
Creatinine, Ser: 0.49 mg/dL (ref 0.44–1.00)
GFR calc Af Amer: >60 mL/min (ref >60)
GFR calc non Af Amer: >60 mL/min (ref >60)
Glucose, Bld: 152 mg/dL — ABNORMAL HIGH (ref 70–99)
Potassium: 3.9 mmol/L (ref 3.5–5.1)
Sodium: 127 mmol/L — ABNORMAL LOW (ref 135–145)

## 2018-05-26 LAB — HEMOGLOBIN AND HEMATOCRIT, BLOOD
HCT: 23.9 % — ABNORMAL LOW (ref 35.0–47.0)
Hemoglobin: 8.4 g/dL — ABNORMAL LOW (ref 12.0–16.0)

## 2018-05-26 LAB — GLUCOSE, CAPILLARY
Glucose-Capillary: 138 mg/dL — ABNORMAL HIGH (ref 70–99)
Glucose-Capillary: 148 mg/dL — ABNORMAL HIGH (ref 70–99)
Glucose-Capillary: 148 mg/dL — ABNORMAL HIGH (ref 70–99)
Glucose-Capillary: 185 mg/dL — ABNORMAL HIGH (ref 70–99)

## 2018-05-26 LAB — PREPARE RBC (CROSSMATCH)

## 2018-05-26 LAB — PREPARE PLATELET PHERESIS: Unit division: 0

## 2018-05-26 LAB — BPAM PLATELET PHERESIS
Blood Product Expiration Date: 201909192359
ISSUE DATE / TIME: 201909171623
Unit Type and Rh: 6200

## 2018-05-26 LAB — RETICULOCYTES
RBC.: 1.69 MIL/uL — ABNORMAL LOW (ref 3.80–5.20)
Retic Count, Absolute: 157.2 10*3/uL (ref 19.0–183.0)
Retic Ct Pct: 9.3 % — ABNORMAL HIGH (ref 0.4–3.1)

## 2018-05-26 LAB — LACTATE DEHYDROGENASE: LDH: 452 U/L — ABNORMAL HIGH (ref 98–192)

## 2018-05-26 MED ORDER — SODIUM CHLORIDE 0.9% IV SOLUTION
Freq: Once | INTRAVENOUS | Status: AC
  Administered 2018-05-26: 09:00:00 via INTRAVENOUS
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
## 2018-05-26 NOTE — Unmapped (Signed)
I received a call from Dr. Merlene Pulling who is hoping to transfer the patient to [facility] asap. The patient has been accepted though there are no beds currently.     The patient continues to require frequent RBC transfusions and aside from known bleeding into extremities, no other obvious sources of blood loss. Pt with an increased indirect bili and I had suggested the possibility that there could be some hemolysis related to minor blood group incompatibility vs I wonder if the patient is developing a coagulopathy such as DIC. Dr. Merlene Pulling had checked for DIC on Friday but hadn't repeated so I suggested to repeat coag's in setting of evolving picture. I noted that in setting of suspected blood loss and frequent transfusion requirements, it may be worth investigating a MPCU or MICU transfer if there are beds there which I suggested to the fellow covering transfers.

## 2018-05-26 NOTE — Discharge Summary (Addendum)
[facility] NAME: Julie Holland    MR#:  [id number]  DATE OF BIRTH:  [birth year]  DATE OF ADMISSION:  05/24/2018 ADMITTING PHYSICIAN: Demetrios Loll, MD  DATE OF DISCHARGE: 05/27/2018  PRIMARY CARE PHYSICIAN: [name], MD    ADMISSION DIAGNOSIS:  AML  DISCHARGE DIAGNOSIS:  Active Problems:   Anemia   SECONDARY DIAGNOSIS:   Past Medical History:  Diagnosis Date  . Arthritis   . Asthma   . 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  . Cataract   . Chronic kidney disease    stage 1  . Diabetes mellitus 2007   type II  . Dry eye of right side   . GERD (gastroesophageal reflux disease)    OCC  . Glaucoma 2018   RIGHT EYE   . Hemorrhoids   . Hyperlipidemia   . 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  1.  Acute on chronic anemia due to large ecchymosis from spontaneous hemorrhage to left upper and lower extremity Patient is status post 5 units PRBC. Dr. Mike Gip discussed patient's case with her oncologist and recommendations are to transfer patient to [facility].   CT scan was performed which did not show evidence of retroperitoneal hematoma.   Lower extremity Dopplers were negative for DVT.  2.  AML: Management as per oncology  3.  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 4.  Essential hypertension: Continue lisinopril and Coreg  5.  Diabetes: Continue sliding scale 6.  Electrolyte: Replete as needed  7.  Chronic kidney disease stage I: Creatinine at baseline   DISCHARGE CONDITIONS AND DIET:   Stable Diabetic diet  CONSULTS OBTAINED:  Treatment Team:  Hessie Knows, MD Lequita Asal, MD  DRUG ALLERGIES:   Allergies  Allergen Reactions  . Hydrocodone Nausea And Vomiting and Other (See Comments)    Vertigo but patient takes  hydrocodone/acetaminophen outpatient    DISCHARGE MEDICATIONS:   Allergies as of 05/27/2018      Reactions   Hydrocodone Nausea And Vomiting, Other (See Comments)   Vertigo but patient takes hydrocodone/acetaminophen outpatient      Medication List    STOP taking these medications   ferrous sulfate 325 (65 FE) MG tablet   levofloxacin 500 MG tablet Commonly known as:  LEVAQUIN   NOXAFIL 100 MG Tbec delayed-release tablet Generic drug:  posaconazole     TAKE these medications   ACCU-CHEK SMARTVIEW test strip Generic drug:  glucose blood USE AS INSTRUCTED TO CHECK  BLOOD GLUCOSE ONCE DAILY if desired; LON 99 months; Dx E11.9   acetaminophen 500 MG tablet Commonly known as:  TYLENOL Take 500-1,000 mg by mouth every 6 (six) hours as needed (for pain).   ADVAIR DISKUS 100-50 MCG/DOSE Aepb Generic drug:  Fluticasone-Salmeterol USE 1 PUFF TWO TIMES DAILY   albuterol 108 (90 Base) MCG/ACT inhaler Commonly known as:  PROVENTIL HFA;VENTOLIN HFA Inhale 2 puffs into the lungs every 6 (six) hours as needed for wheezing or shortness of breath.   carvedilol 6.25 MG tablet Commonly known as:  COREG Take 1 tablet (6.25 mg total) by mouth 2 (two) times daily with a meal.   ESTROVEN PO Take 1 tablet by mouth daily.   latanoprost 0.005 % ophthalmic solution Commonly known as:  XALATAN Place 1 drop  at bedtime into the right eye.   lisinopril 10 MG tablet Commonly known as:  PRINIVIL,ZESTRIL TAKE 1 TABLET BY MOUTH  DAILY   metFORMIN 850 MG tablet Commonly known as:  GLUCOPHAGE Take 1 tablet (850 mg total) by mouth 2 (two) times daily with a meal.   multivitamin tablet Take 2 tablets by mouth daily.   pantoprazole 40 MG tablet Commonly known as:  PROTONIX Take 1 tablet (40 mg total) by mouth 2 (two) times daily before a meal.   REFRESH OPTIVE ADVANCED OP Place 1 drop into both eyes 3 (three) times daily as needed (for dry/irritated eyes).   simvastatin 20 MG  tablet Commonly known as:  ZOCOR Take 1 tablet (20 mg total) by mouth at bedtime.   VENCLEXTA 100 MG Tabs Generic drug:  venetoclax Take 100 mg by mouth daily.         Today   CHIEF COMPLAINT:  Patient looks pale and is very weak   VITAL SIGNS:  Blood pressure 132/67, pulse 99, temperature 98.4 F (36.9 C), temperature source Oral, resp. rate (!) 22, height 5\' 3"  (1.6 m), weight 56.3 kg, SpO2 98 %.   REVIEW OF SYSTEMS:  Review of Systems  Constitutional: Negative.  Negative for chills, fever and malaise/fatigue.  HENT: Negative.  Negative for ear discharge, ear pain, hearing loss, nosebleeds and sore throat.   Eyes: Negative.  Negative for blurred vision and pain.  Respiratory: Negative.  Negative for cough, hemoptysis, shortness of breath and wheezing.   Cardiovascular: Negative.  Negative for chest pain, palpitations and leg swelling.  Gastrointestinal: Negative.  Negative for abdominal pain, blood in stool, diarrhea, nausea and vomiting.  Genitourinary: Negative.  Negative for dysuria.  Musculoskeletal: Negative.  Negative for back pain.       Left leg arm swelling bruising  Skin: Negative.   Neurological: Negative for dizziness, tremors, speech change, focal weakness, seizures and headaches.  Endo/Heme/Allergies: Negative.  Does not bruise/bleed easily.  Psychiatric/Behavioral: Negative.  Negative for depression, hallucinations and suicidal ideas.     PHYSICAL EXAMINATION:  GENERAL:  77 y.o.-year-old patient lying in the bed with no acute distress.  NECK:  Supple, no jugular venous distention. No thyroid enlargement, no tenderness.  LUNGS: Normal breath sounds bilaterally, no wheezing, rales,rhonchi  No use of accessory muscles of respiration.  CARDIOVASCULAR: S1, S2 normal. No murmurs, rubs, or gallops.  ABDOMEN: Soft, non-tender, non-distended. Bowel sounds present. No organomegaly or mass.  EXTREMITIES: Upper and lower extremity with ecchymosis as well as  swelling PSYCHIATRIC: The patient is alert and oriented x 3.  SKIN: Very pale  DATA REVIEW:   CBC Recent Labs  Lab 05/27/18 0415  WBC 13.0*  HGB 7.8*  HCT 22.5*  PLT 41*    Chemistries  Recent Labs  Lab 05/24/18 0806 05/25/18 0459 05/26/18 0400  NA 130* 128* 127*  K 3.8 3.2* 3.9  CL 94* 93* 92*  CO2 28 29 29   GLUCOSE 197* 149* 152*  BUN 20 19 18   CREATININE 0.75 0.55 0.49  CALCIUM 8.2* 7.6* 7.6*  MG 1.4* 1.4*  --   AST 44*  --   --   ALT 22  --   --   ALKPHOS 86  --   --   BILITOT 3.0*  --   --     Cardiac Enzymes No results for input(s): TROPONINI in the last 168 hours.  Microbiology Results  @[email or web address] @  RADIOLOGY:  Ct Abdomen Pelvis Wo Contrast  Result Date: 05/25/2018  CLINICAL DATA:  Decreased hemoglobin level with marked swelling and bruising in the left arm and leg. History of AML. EXAM: CT ABDOMEN AND PELVIS WITHOUT CONTRAST TECHNIQUE: Multidetector CT imaging of the abdomen and pelvis was performed following the standard protocol without IV contrast. COMPARISON:  CT 04/16/2018. FINDINGS: Lower chest: Mild dependent atelectasis at both lung bases. There are trace bilateral pleural effusions and a trace pericardial effusion versus pericardial thickening, similar to previous study. Hepatobiliary: There is a stable partially calcified, but otherwise low-density lesion in the dome of the left hepatic lobe, measuring 3.4 cm on image 15/2. An adjacent simple cyst on image 14/2 is stable. No new or enlarging hepatic lesions identified. As before, there is intra and extrahepatic biliary dilatation post cholecystectomy. The common hepatic duct measures 13 mm in diameter. No evidence of calcified intraductal stone. Pancreas: Atrophied. There is a stable small calcification inferiorly in the pancreatic head which appears extrinsic to pancreatic duct and common bile duct. No pancreatic ductal dilatation or surrounding inflammation. Spleen: Normal in size without focal  abnormality. Adrenals/Urinary Tract: Both adrenal glands appear normal. Both kidneys appear normal. No evidence of urinary tract calculus or hydronephrosis. The bladder appears normal. Stomach/Bowel: No evidence of bowel wall thickening, distention or surrounding inflammatory change. Vascular/Lymphatic: There are no enlarged abdominal or pelvic lymph nodes. Mild aortic and branch vessel atherosclerosis. Reproductive: Hysterectomy.  No evidence of adnexal mass. Other: There is no retroperitoneal hematoma. There is no ascites or free air. Musculoskeletal: There is progressive ill-defined enlargement of the left gluteus maximus muscle which most likely represents progressive hemorrhage in this clinical context. There is progressive subcutaneous edema throughout the visualized proximal left thigh and buttock. Edema extends superiorly into both flanks. However, no other focal fluid collections are seen. No acute osseous findings. IMPRESSION: 1. Progressive enlargement of the left gluteus maximus muscle, likely representing progressive hemorrhage or myositis. There is progressive edema throughout the left buttock and proximal thigh which may be due to superimposed cellulitis. 2. No evidence of retroperitoneal hematoma or hemoperitoneum. 3. No evidence of adenopathy or splenomegaly. 4. Intra and extrahepatic biliary dilatation of uncertain etiology, similar to recent prior CT. This is more than typically seen post cholecystectomy. Recent labs demonstrate mild elevation of the total bilirubin level, but no significant elevation of the alkaline phosphatase or transaminase levels. Electronically Signed   By: Richardean Sale M.D.   On: 05/25/2018 14:56      Allergies as of 05/27/2018      Reactions   Hydrocodone Nausea And Vomiting, Other (See Comments)   Vertigo but patient takes hydrocodone/acetaminophen outpatient      Medication List    STOP taking these medications   ferrous sulfate 325 (65 FE) MG tablet    levofloxacin 500 MG tablet Commonly known as:  LEVAQUIN   NOXAFIL 100 MG Tbec delayed-release tablet Generic drug:  posaconazole     TAKE these medications   ACCU-CHEK SMARTVIEW test strip Generic drug:  glucose blood USE AS INSTRUCTED TO CHECK  BLOOD GLUCOSE ONCE DAILY if desired; LON 99 months; Dx E11.9   acetaminophen 500 MG tablet Commonly known as:  TYLENOL Take 500-1,000 mg by mouth every 6 (six) hours as needed (for pain).   ADVAIR DISKUS 100-50 MCG/DOSE Aepb Generic drug:  Fluticasone-Salmeterol USE 1 PUFF TWO TIMES DAILY   albuterol 108 (90 Base) MCG/ACT inhaler Commonly known as:  PROVENTIL HFA;VENTOLIN HFA Inhale 2 puffs into the lungs every 6 (six) hours as needed for wheezing or shortness of  breath.   carvedilol 6.25 MG tablet Commonly known as:  COREG Take 1 tablet (6.25 mg total) by mouth 2 (two) times daily with a meal.   ESTROVEN PO Take 1 tablet by mouth daily.   latanoprost 0.005 % ophthalmic solution Commonly known as:  XALATAN Place 1 drop at bedtime into the right eye.   lisinopril 10 MG tablet Commonly known as:  PRINIVIL,ZESTRIL TAKE 1 TABLET BY MOUTH  DAILY   metFORMIN 850 MG tablet Commonly known as:  GLUCOPHAGE Take 1 tablet (850 mg total) by mouth 2 (two) times daily with a meal.   multivitamin tablet Take 2 tablets by mouth daily.   pantoprazole 40 MG tablet Commonly known as:  PROTONIX Take 1 tablet (40 mg total) by mouth 2 (two) times daily before a meal.   REFRESH OPTIVE ADVANCED OP Place 1 drop into both eyes 3 (three) times daily as needed (for dry/irritated eyes).   simvastatin 20 MG tablet Commonly known as:  ZOCOR Take 1 tablet (20 mg total) by mouth at bedtime.   VENCLEXTA 100 MG Tabs Generic drug:  venetoclax Take 100 mg by mouth daily.           Management plans discussed with the patient and she is in agreement. Stable for discharge   Patient should follow up with oncology  CODE STATUS:     Code  Status Orders  (From admission, onward)         Start     Ordered   05/24/18 1315  Do not attempt resuscitation (DNR)  Continuous    Question Answer Comment  In the event of cardiac or respiratory ARREST Do not call a "code blue"   In the event of cardiac or respiratory ARREST Do not perform Intubation, CPR, defibrillation or ACLS   In the event of cardiac or respiratory ARREST Use medication by any route, position, wound care, and other measures to relive pain and suffering. May use oxygen, suction and manual treatment of airway obstruction as needed for comfort.      05/24/18 1314        Code Status History    Date Active Date Inactive Code Status Order ID Comments User Context   05/24/2018 1252 05/24/2018 1314 Full Code [id number]  Demetrios Loll, MD Inpatient   02/18/2018 1622 02/20/2018 1400 Full Code [id number]  Nicholes Mango, MD Inpatient   02/10/2018 1413 02/13/2018 2024 Full Code [id number]  Saundra Shelling, MD Inpatient   07/31/2016 1649 08/01/2016 1454 Full Code [id number]  Robert Bellow, MD Inpatient   07/31/2016 0918 07/31/2016 1649 Full Code [id number]  Byrnett, Forest Gleason, MD Inpatient      TOTAL TIME TAKING CARE OF THIS PATIENT: 39 minutes.    Note: This dictation was prepared with Dragon dictation along with smaller phrase technology. Any transcriptional errors that result from this process are unintentional.  Taysia Rivere M.D on 05/27/2018 at 11:51 AM  Between 7am to 6pm - Pager - [phone] After 6pm go to [email or web address] - password [email or web address] [facility]  Office  [phone]  CC: Primary care physician; [name], MD

## 2018-05-26 NOTE — Progress Notes (Signed)
Hgb this AM is 5.4 , down from 7.1 yesterday. Pt has received 3 units PRBC this admission.  Text paged hospitalist.  Vitals stable 98 T, 118/61 BP, HR 99, SpO2 99 on room air. Dorna Bloom RN

## 2018-05-26 NOTE — Progress Notes (Signed)
Applied TED hose to left leg and elevated on a pillow.  Wrapped left arm from hand to above elbow with ACE and elevated arm on 2 pillows. Dorna Bloom RN

## 2018-05-26 NOTE — Progress Notes (Signed)
[facility] [place] NAME: Julie Holland    MR#:  [id number]  DATE OF BIRTH:  08-Apr-1941  SUBJECTIVE:   plan to transfer to [facility]   REVIEW OF SYSTEMS:     Review of Systems  Constitutional: Negative for fever, chills weight loss HENT: Negative for ear pain, nosebleeds, congestion, facial swelling, rhinorrhea, neck pain, neck stiffness and ear discharge.   Respiratory: Negative for cough, shortness of breath, wheezing  Cardiovascular: Negative for chest pain, palpitations and leg swelling.  Gastrointestinal: Negative for heartburn, abdominal pain, vomiting, diarrhea or consitpation Genitourinary: Negative for dysuria, urgency, frequency, hematuria Musculoskeletal: Negative for back pain or joint pain Neurological: Negative for dizziness, seizures, syncope, focal weakness,  numbness and headaches.  Hematological: Does not bruise/bleed easily.  Psychiatric/Behavioral: Negative for hallucinations, confusion, dysphoric mood  Skin: Ecchymosis left hand and leg and back With swelling  Tolerating Diet: yes      DRUG ALLERGIES:   Allergies  Allergen Reactions  . Hydrocodone Nausea And Vomiting and Other (See Comments)    Vertigo but patient takes hydrocodone/acetaminophen outpatient    VITALS:  Blood pressure 110/78, pulse 90, temperature 97.8 F (36.6 C), temperature source Oral, resp. rate 20, height 5\' 3"  (1.6 m), weight 56.3 kg, SpO2 100 %.  PHYSICAL EXAMINATION:  Constitutional: Appears well-developed and well-nourished. No distress. HENT: Normocephalic. Marland Kitchen Oropharynx is clear and moist.  Eyes: Conjunctivae and EOM are normal. PERRLA, no scleral icterus.  Neck: Normal ROM. Neck supple. No JVD. No tracheal deviation. CVS: RRR, S1/S2 +, no murmurs, no gallops, no carotid bruit.  Pulmonary: Effort and breath sounds normal, no stridor, rhonchi, wheezes, rales.  Abdominal: Soft. BS +,  no distension, tenderness, rebound or guarding.   Musculoskeletal: Normal range of motion. No edema and no tenderness.  Neuro: Alert. CN 2-12 grossly intact. No focal deficits. Skin: Ecchymosis left hand/arm and extends through left leg and back   psychiatric: Normal mood and affect.      LABORATORY PANEL:   CBC Recent Labs  Lab 05/26/18 0400  WBC 12.4*  HGB 5.4*  HCT 15.6*  PLT 55*   ------------------------------------------------------------------------------------------------------------------  Chemistries  Recent Labs  Lab 05/24/18 0806 05/25/18 0459 05/26/18 0400  NA 130* 128* 127*  K 3.8 3.2* 3.9  CL 94* 93* 92*  CO2 28 29 29   GLUCOSE 197* 149* 152*  BUN 20 19 18   CREATININE 0.75 0.55 0.49  CALCIUM 8.2* 7.6* 7.6*  MG 1.4* 1.4*  --   AST 44*  --   --   ALT 22  --   --   ALKPHOS 86  --   --   BILITOT 3.0*  --   --    ------------------------------------------------------------------------------------------------------------------  Cardiac Enzymes No results for input(s): TROPONINI in the last 168 hours. ------------------------------------------------------------------------------------------------------------------  RADIOLOGY:  Ct Abdomen Pelvis Wo Contrast  Result Date: 05/25/2018 CLINICAL DATA:  Decreased hemoglobin level with marked swelling and bruising in the left arm and leg. History of AML. EXAM: CT ABDOMEN AND PELVIS WITHOUT CONTRAST TECHNIQUE: Multidetector CT imaging of the abdomen and pelvis was performed following the standard protocol without IV contrast. COMPARISON:  CT 04/16/2018. FINDINGS: Lower chest: Mild dependent atelectasis at both lung bases. There are trace bilateral pleural effusions and a trace pericardial effusion versus pericardial thickening, similar to previous study. Hepatobiliary: There is a stable partially calcified, but otherwise low-density lesion in the dome of the left hepatic lobe, measuring 3.4 cm on image 15/2. An adjacent simple  cyst on image 14/2 is stable. No new  or enlarging hepatic lesions identified. As before, there is intra and extrahepatic biliary dilatation post cholecystectomy. The common hepatic duct measures 13 mm in diameter. No evidence of calcified intraductal stone. Pancreas: Atrophied. There is a stable small calcification inferiorly in the pancreatic head which appears extrinsic to pancreatic duct and common bile duct. No pancreatic ductal dilatation or surrounding inflammation. Spleen: Normal in size without focal abnormality. Adrenals/Urinary Tract: Both adrenal glands appear normal. Both kidneys appear normal. No evidence of urinary tract calculus or hydronephrosis. The bladder appears normal. Stomach/Bowel: No evidence of bowel wall thickening, distention or surrounding inflammatory change. Vascular/Lymphatic: There are no enlarged abdominal or pelvic lymph nodes. Mild aortic and branch vessel atherosclerosis. Reproductive: Hysterectomy.  No evidence of adnexal mass. Other: There is no retroperitoneal hematoma. There is no ascites or free air. Musculoskeletal: There is progressive ill-defined enlargement of the left gluteus maximus muscle which most likely represents progressive hemorrhage in this clinical context. There is progressive subcutaneous edema throughout the visualized proximal left thigh and buttock. Edema extends superiorly into both flanks. However, no other focal fluid collections are seen. No acute osseous findings. IMPRESSION: 1. Progressive enlargement of the left gluteus maximus muscle, likely representing progressive hemorrhage or myositis. There is progressive edema throughout the left buttock and proximal thigh which may be due to superimposed cellulitis. 2. No evidence of retroperitoneal hematoma or hemoperitoneum. 3. No evidence of adenopathy or splenomegaly. 4. Intra and extrahepatic biliary dilatation of uncertain etiology, similar to recent prior CT. This is more than typically seen post cholecystectomy. Recent labs demonstrate  mild elevation of the total bilirubin level, but no significant elevation of the alkaline phosphatase or transaminase levels. Electronically Signed   By: Richardean Sale M.D.   On: 05/25/2018 14:56   US Venous Img Lower Unilateral Left  Result Date: 05/24/2018 CLINICAL DATA:  77 year old female with a history of swelling EXAM: LEFT LOWER EXTREMITY VENOUS DOPPLER ULTRASOUND TECHNIQUE: Gray-scale sonography with graded compression, as well as color Doppler and duplex ultrasound were performed to evaluate the lower extremity deep venous systems from the level of the common femoral vein and including the common femoral, femoral, profunda femoral, popliteal and calf veins including the posterior tibial, peroneal and gastrocnemius veins when visible. The superficial great saphenous vein was also interrogated. Spectral Doppler was utilized to evaluate flow at rest and with distal augmentation maneuvers in the common femoral, femoral and popliteal veins. COMPARISON:  None. FINDINGS: Contralateral Common Femoral Vein: Respiratory phasicity is normal and symmetric with the symptomatic side. No evidence of thrombus. Normal compressibility. Common Femoral Vein: No evidence of thrombus. Normal compressibility, respiratory phasicity and response to augmentation. Saphenofemoral Junction: No evidence of thrombus. Normal compressibility and flow on color Doppler imaging. Profunda Femoral Vein: No evidence of thrombus. Normal compressibility and flow on color Doppler imaging. Femoral Vein: No evidence of thrombus. Normal compressibility, respiratory phasicity and response to augmentation. Popliteal Vein: No evidence of thrombus. Normal compressibility, respiratory phasicity and response to augmentation. Calf Veins: Limited visualization of the tibial veins Superficial Great Saphenous Vein: No evidence of thrombus. Normal compressibility and flow on color Doppler imaging. Other Findings:  Edema IMPRESSION: Sonographic survey of  the left lower extremity negative for DVT Edema Electronically Signed   By: Corrie Mckusick D.O.   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  1. Acute  on chronic anemia due to large ecchymosis from spontaneous hemorrhage to left upper and lower extremity Patient is status post 2 units PRBC yesterday and will have 2 more PRBCs.  Her hemoglobin continues to drop.  Dr. Mike Gip discussed patient's case with her oncologist and recommendations are to [facility].   CT scan was performed which did not show evidence of retroperitoneal hematoma.   Lower extremity Dopplers were negative for DVT.  2. AML: Management as per oncology  3. 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 4. Essential hypertension: Continue lisinopril and Coreg  5. Diabetes: Continue sliding scale 6. Electrolyte: Replete as needed  7. Chronic kidney disease stage I: Creatinine at baseline Management plans discussed with the patient and she is in agreement.  CODE STATUS: DNR  TOTAL TIME TAKING CARE OF THIS PATIENT: 24 minutes.     POSSIBLE D/C today or tomorrow,transfer to [facility] dr corcoran arranging.  Kyrie Bun M.D on 05/26/2018 at 11:57 AM  Between 7am to 6pm - Pager - [phone] After 6pm go to [email or web address] - password [email or web address] [facility]  Office  [phone]  CC: Primary care physician; [name], MD  Note: This dictation was prepared with Dragon dictation along with smaller phrase technology. Any transcriptional errors that result from this process are unintentional.

## 2018-05-26 NOTE — Progress Notes (Signed)
PT Cancellation Note  Patient Details Name: Julie Holland MRN: [id number] DOB: March 20, 1941   Cancelled Treatment:    Reason Eval/Treat Not Completed: Other (comment). Consult received, chart reviewed. Pt with trending down Hgb at 5.2 this date s/p blood transfusion yesterday. Per chart, pt to transfer to [facility] hospital for further treatment. Pt not appropriate to work with therapy at this time. Will hold.   [name] 05/26/2018, 9:43 AM  Greggory Stallion, PT, DPT [phone]

## 2018-05-26 NOTE — Progress Notes (Signed)
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  Subjective: Feels "the same".  She denies any chest pain or shortness of breath.  No bleeding.  Social History: The patient is accompanied by her husband today.  Allergies:  Allergies  Allergen Reactions  . Hydrocodone Nausea And Vomiting and Other (See Comments)    Vertigo but patient takes hydrocodone/acetaminophen outpatient    Scheduled Medications: . sodium chloride   Intravenous Once  . carvedilol  6.25 mg Oral BID WC  . Influenza vac split quadrivalent PF  0.5 mL Intramuscular Tomorrow-1000  . insulin aspart  0-5 Units Subcutaneous QHS  . insulin aspart  0-9 Units Subcutaneous TID WC  . latanoprost  1 drop Right Eye QHS  . lisinopril  10 mg Oral Daily  . pantoprazole  40 mg Oral BID AC  . venetoclax  400 mg Oral Q supper    Review of Systems: GENERAL:  Tired.  Feels "the same".  No fevers, sweats. PERFORMANCE STATUS (ECOG):  2-3 HEENT:  No visual changes, runny nose, sore throat, mouth sores or tenderness. Lungs: No shortness of breath or cough.  No hemoptysis. Cardiac:  No chest pain, palpitations, orthopnea, or PND. GI:  No nausea, vomiting, diarrhea, constipation, melena or hematochezia. GU:  No urgency, frequency, dysuria, or hematuria. Musculoskeletal:  Discomfort in left arm and leg.Marland Kitchen Extremities:  Swelling in left arm and leg improved after wrapping. Skin:  Jaundice.  No rashes or skin changes. Neuro:  No headache, numbness or weakness, balance or coordination issues. Endocrine:  Diabetes.  No thyroid issues, hot flashes or night sweats. Psych:  No mood changes, depression or anxiety. Pain:  No current  pain. Review of systems:  All other systems reviewed and found to be negative.   Physical Exam: Blood pressure (!) 131/49, pulse 93, temperature 97.9 F (36.6 C), temperature source Oral, resp. rate (!) 24, height _0  (1.6 m), weight 124 lb 1.6 oz (56.3 kg), SpO2 100 %.  GENERAL:  Thin elderly woman sitting comfortably on the medical unit in no acute distress. MENTAL STATUS:  Alert and oriented to person, place and time. HEAD:  Pearline Cables hair.  Normocephalic, atraumatic, face symmetric, no Cushingoid features. EYES:  Brown eyes.  Pupils equal round and reactive to light and accomodation.  Slight scleral icterus. ENT:  Oropharynx clear without lesion.  Tongue normal. Mucous membranes moist.  RESPIRATORY:  Clear to auscultation without rales, wheezes or rhonchi. CARDIOVASCULAR:  Regular rate and rhythm without murmur, rub or gallop. ABDOMEN:  Soft, non-tender, with active bowel sounds, and no hepatosplenomegaly.  No masses. SKIN:  Pale.  Jaundice.  Flank ecchymosis fading.  No rashes. EXTREMITIES: Left distal extremity with decreased edema s/p wrapping.  Left thigh high support stocking in place.  Edema above stocking.  Left forearm wrapped.  Dramatic decrease in left arm and hand edema.  Hand with purple hue from prior bleed. LYMPH NODES: No palpable cervical, supraclavicular, axillary or inguinal adenopathy  NEUROLOGICAL: Unremarkable. PSYCH:  Appropriate.    Results for orders placed or performed during the hospital encounter of 05/24/18 (from the past 48 hour(s))  Glucose, capillary     Status: None   Collection Time: 05/24/18 12:37 PM  Result Value  Ref Range   Glucose-Capillary 91 70 - 99 mg/dL  Prepare RBC     Status: None   Collection Time: 05/24/18  1:27 PM  Result Value Ref Range   Order Confirmation      DUPLICATE Performed at [facility], [address] [place], [place] [address]   Glucose, capillary     Status: Abnormal   Collection Time: 05/24/18  4:49 PM   Result Value Ref Range   Glucose-Capillary 110 (H) 70 - 99 mg/dL  Glucose, capillary     Status: Abnormal   Collection Time: 05/24/18  8:52 PM  Result Value Ref Range   Glucose-Capillary 154 (H) 70 - 99 mg/dL  Basic metabolic panel     Status: Abnormal   Collection Time: 05/25/18  4:59 AM  Result Value Ref Range   Sodium 128 (L) 135 - 145 mmol/L   Potassium 3.2 (L) 3.5 - 5.1 mmol/L   Chloride 93 (L) 98 - 111 mmol/L   CO2 29 22 - 32 mmol/L   Glucose, Bld 149 (H) 70 - 99 mg/dL   BUN 19 8 - 23 mg/dL   Creatinine, Ser 0.55 0.44 - 1.00 mg/dL   Calcium 7.6 (L) 8.9 - 10.3 mg/dL   GFR calc non Af Amer >60 >60 mL/min   GFR calc Af Amer >60 >60 mL/min    Comment: (NOTE) The eGFR has been calculated using the CKD EPI equation. This calculation has not been validated in all clinical situations. eGFR's persistently <60 mL/min signify possible Chronic Kidney Disease.    Anion gap 6 5 - 15    Comment: Performed at [facility], [address] [place], [place] [address]  CBC     Status: Abnormal   Collection Time: 05/25/18  4:59 AM  Result Value Ref Range   WBC 8.8 3.6 - 11.0 K/uL   RBC 2.31 (L) 3.80 - 5.20 MIL/uL   Hemoglobin 7.1 (L) 12.0 - 16.0 g/dL   HCT 20.8 (L) 35.0 - 47.0 %   MCV 90.0 80.0 - 100.0 fL   MCH 30.8 26.0 - 34.0 pg   MCHC 34.3 32.0 - 36.0 g/dL   RDW 16.1 (H) 11.5 - 14.5 %   Platelets 26 (LL) 150 - 440 K/uL    Comment: RESULT REPEATED AND VERIFIED PLATELET COUNT CONFIRMED BY SMEAR CRITICAL RESULT CALLED TO, READ BACK BY AND VERIFIED WITH: Earlie Server ON 05/25/18 AT 50 KP Performed at [facility], [address], [place], [place] [address]   Magnesium     Status: Abnormal   Collection Time: 05/25/18  4:59 AM  Result Value Ref Range   Magnesium 1.4 (L) 1.7 - 2.4 mg/dL    Comment: Performed at [facility], [address] [place], [place] [address]  Glucose, capillary     Status: Abnormal   Collection Time: 05/25/18  7:50 AM   Result Value Ref Range   Glucose-Capillary 144 (H) 70 - 99 mg/dL  Prepare RBC     Status: None   Collection Time: 05/25/18  8:54 AM  Result Value Ref Range   Order Confirmation      ORDER PROCESSED BY BLOOD BANK Performed at [facility], [address] [place], [place] [address]   Glucose, capillary     Status: Abnormal   Collection Time: 05/25/18 11:40 AM  Result Value Ref Range   Glucose-Capillary 139 (H) 70 - 99 mg/dL  Hemoglobin and hematocrit, blood     Status: Abnormal  Collection Time: 05/25/18  1:54 PM  Result Value Ref Range   Hemoglobin 7.1 (L) 12.0 - 16.0 g/dL   HCT 20.5 (L) 35.0 - 47.0 %    Comment: Performed at [facility], [address] [place], [place] [address] CORRECTED ON 09/17 AT 1443: PREVIOUSLY REPORTED AS 20.9   Prepare Pheresed Platelets     Status: None   Collection Time: 05/25/18  2:04 PM  Result Value Ref Range   Unit Number [id number]    Blood Component Type PLTPH LI2 PAS    Unit division 00    Status of Unit ISSUED,FINAL    Transfusion Status      OK TO TRANSFUSE Performed at [facility], [address] [place], [place] [address]   Glucose, capillary     Status: Abnormal   Collection Time: 05/25/18  4:29 PM  Result Value Ref Range   Glucose-Capillary 128 (H) 70 - 99 mg/dL  Platelet count     Status: Abnormal   Collection Time: 05/25/18  7:39 PM  Result Value Ref Range   Platelets 63 (L) 150 - 440 K/uL    Comment: Performed at [facility], [address] [place], [place] [address]  Glucose, capillary     Status: Abnormal   Collection Time: 05/25/18  8:33 PM  Result Value Ref Range   Glucose-Capillary 150 (H) 70 - 99 mg/dL  Glucose, capillary     Status: Abnormal   Collection Time: 05/25/18  9:52 PM  Result Value Ref Range   Glucose-Capillary 144 (H) 70 - 99 mg/dL  CBC     Status: Abnormal   Collection Time: 05/26/18  4:00 AM  Result Value Ref Range   WBC 12.4 (H) 3.6 - 11.0 K/uL    RBC 1.69 (L) 3.80 - 5.20 MIL/uL   Hemoglobin 5.4 (L) 12.0 - 16.0 g/dL    Comment: RESULT REPEATED AND VERIFIED   HCT 15.6 (L) 35.0 - 47.0 %   MCV 92.5 80.0 - 100.0 fL   MCH 31.8 26.0 - 34.0 pg   MCHC 34.4 32.0 - 36.0 g/dL   RDW 16.1 (H) 11.5 - 14.5 %   Platelets 55 (L) 150 - 440 K/uL    Comment: Performed at [facility], [address] [place], [place] [address]  Basic metabolic panel     Status: Abnormal   Collection Time: 05/26/18  4:00 AM  Result Value Ref Range   Sodium 127 (L) 135 - 145 mmol/L   Potassium 3.9 3.5 - 5.1 mmol/L   Chloride 92 (L) 98 - 111 mmol/L   CO2 29 22 - 32 mmol/L   Glucose, Bld 152 (H) 70 - 99 mg/dL   BUN 18 8 - 23 mg/dL   Creatinine, Ser 0.49 0.44 - 1.00 mg/dL   Calcium 7.6 (L) 8.9 - 10.3 mg/dL   GFR calc non Af Amer >60 >60 mL/min   GFR calc Af Amer >60 >60 mL/min    Comment: (NOTE) The eGFR has been calculated using the CKD EPI equation. This calculation has not been validated in all clinical situations. eGFR's persistently <60 mL/min signify possible Chronic Kidney Disease.    Anion gap 6 5 - 15    Comment: Performed at [facility], [address] [place], [place] [address]  Lactate dehydrogenase     Status: Abnormal   Collection Time: 05/26/18  4:00 AM  Result Value Ref Range   LDH 452 (H) 98 - 192 U/L  Comment: Performed at [facility], [address] [place], [place] [address]  Reticulocytes     Status: Abnormal   Collection Time: 05/26/18  4:00 AM  Result Value Ref Range   Retic Ct Pct 9.3 (H) 0.4 - 3.1 %   RBC. 1.69 (L) 3.80 - 5.20 MIL/uL   Retic Count, Absolute 157.2 19.0 - 183.0 K/uL    Comment: Performed at [facility], [address] [place], [place] [address]  Prepare RBC     Status: None   Collection Time: 05/26/18  6:53 AM  Result Value Ref Range   Order Confirmation      ORDER PROCESSED BY BLOOD BANK Performed at [facility], [address] [place], [place]  [address]   Glucose, capillary     Status: Abnormal   Collection Time: 05/26/18  7:40 AM  Result Value Ref Range   Glucose-Capillary 138 (H) 70 - 99 mg/dL   Ct Abdomen Pelvis Wo Contrast  Result Date: 05/25/2018 CLINICAL DATA:  Decreased hemoglobin level with marked swelling and bruising in the left arm and leg. History of AML. EXAM: CT ABDOMEN AND PELVIS WITHOUT CONTRAST TECHNIQUE: Multidetector CT imaging of the abdomen and pelvis was performed following the standard protocol without IV contrast. COMPARISON:  CT 04/16/2018. FINDINGS: Lower chest: Mild dependent atelectasis at both lung bases. There are trace bilateral pleural effusions and a trace pericardial effusion versus pericardial thickening, similar to previous study. Hepatobiliary: There is a stable partially calcified, but otherwise low-density lesion in the dome of the left hepatic lobe, measuring 3.4 cm on image 15/2. An adjacent simple cyst on image 14/2 is stable. No new or enlarging hepatic lesions identified. As before, there is intra and extrahepatic biliary dilatation post cholecystectomy. The common hepatic duct measures 13 mm in diameter. No evidence of calcified intraductal stone. Pancreas: Atrophied. There is a stable small calcification inferiorly in the pancreatic head which appears extrinsic to pancreatic duct and common bile duct. No pancreatic ductal dilatation or surrounding inflammation. Spleen: Normal in size without focal abnormality. Adrenals/Urinary Tract: Both adrenal glands appear normal. Both kidneys appear normal. No evidence of urinary tract calculus or hydronephrosis. The bladder appears normal. Stomach/Bowel: No evidence of bowel wall thickening, distention or surrounding inflammatory change. Vascular/Lymphatic: There are no enlarged abdominal or pelvic lymph nodes. Mild aortic and branch vessel atherosclerosis. Reproductive: Hysterectomy.  No evidence of adnexal mass. Other: There is no retroperitoneal hematoma. There  is no ascites or free air. Musculoskeletal: There is progressive ill-defined enlargement of the left gluteus maximus muscle which most likely represents progressive hemorrhage in this clinical context. There is progressive subcutaneous edema throughout the visualized proximal left thigh and buttock. Edema extends superiorly into both flanks. However, no other focal fluid collections are seen. No acute osseous findings. IMPRESSION: 1. Progressive enlargement of the left gluteus maximus muscle, likely representing progressive hemorrhage or myositis. There is progressive edema throughout the left buttock and proximal thigh which may be due to superimposed cellulitis. 2. No evidence of retroperitoneal hematoma or hemoperitoneum. 3. No evidence of adenopathy or splenomegaly. 4. Intra and extrahepatic biliary dilatation of uncertain etiology, similar to recent prior CT. This is more than typically seen post cholecystectomy. Recent labs demonstrate mild elevation of the total bilirubin level, but no significant elevation of the alkaline phosphatase or transaminase levels. Electronically Signed   By: Richardean Sale M.D.   On: 05/25/2018 14:56   US Venous Img Lower Unilateral Left  Result Date: 05/24/2018 CLINICAL DATA:  77 year old female with a history of swelling EXAM: LEFT LOWER EXTREMITY VENOUS DOPPLER ULTRASOUND TECHNIQUE: Gray-scale sonography with graded compression, as well as color Doppler and duplex ultrasound were performed to evaluate the lower extremity deep venous systems from the level of the common femoral vein and including the common femoral, femoral, profunda femoral, popliteal and calf veins including the posterior tibial, peroneal and gastrocnemius veins when visible. The superficial great saphenous vein was also interrogated. Spectral Doppler was utilized to evaluate flow at rest and with distal augmentation maneuvers in the common femoral, femoral and popliteal veins. COMPARISON:  None. FINDINGS:  Contralateral Common Femoral Vein: Respiratory phasicity is normal and symmetric with the symptomatic side. No evidence of thrombus. Normal compressibility. Common Femoral Vein: No evidence of thrombus. Normal compressibility, respiratory phasicity and response to augmentation. Saphenofemoral Junction: No evidence of thrombus. Normal compressibility and flow on color Doppler imaging. Profunda Femoral Vein: No evidence of thrombus. Normal compressibility and flow on color Doppler imaging. Femoral Vein: No evidence of thrombus. Normal compressibility, respiratory phasicity and response to augmentation. Popliteal Vein: No evidence of thrombus. Normal compressibility, respiratory phasicity and response to augmentation. Calf Veins: Limited visualization of the tibial veins Superficial Great Saphenous Vein: No evidence of thrombus. Normal compressibility and flow on color Doppler imaging. Other Findings:  Edema IMPRESSION: Sonographic survey of the left lower extremity negative for DVT Edema Electronically Signed   By: Corrie Mckusick D.O.   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    She has marked anemia with a rapid decline in hemoglobin over the past 3 days worrisome for bleeding or hemolysis.  Hemoglobin increased from 6.2 to 7.1 after 2 units.  She has mild hyperbilirubinemia. Coombs is negative.  Hemoglobin dropping despite transfusion of 3 units PRBCs and platelet transfusion.  Symptomatically, she denies any acute symptoms.  Exam reveals improvement in distal left arm and leg after wrapping.  Hemoglobin is 5.4.  Platelet count is 55,000.  Plan:   1.  Oncology:              Patient is day 53 s/p cycle #1 azacytidine and venetoclax.               Cycle #2 has been postponed secondary to current events (due 05/24/2018).               Blast count was 65% blasts on  05/24/2018.  Patient restarted venetoclax last night (patient's husband brought from home).  2.  Hematology:               Patient is transfusion dependent.  Blood products are leukopoor and irradiated.             Patient transfused with 3 units irradiated PRBCs with continued drop in hemoglobin.             Abdomen and pelvic CT revealed no retroperitoneal hemorrhage.  Known bleed into left arm and leg appears to be stable.  Evaluation for hemolysis:  Coombs negative.  Haptoglobin pending.  LDH high (? hemolysis or secondary to active AML).             Maintaining platelet count higher (goal 50,000).    Patient received a platelet transfusion yesterday with improvement from 26,000 to 63,000 (1 hour post platelet count).    Labs on 05/21/2018 revealed no  evidence of DIC.  Discussed with Dr. Prince Solian last night re: transfer to [facility] if no improvement.    With drop again in hemoglobin, patient and her husband agree to transfer.  Discussed with Dr Benjie Karvonen and patient's nurse.              3.  Orthopedics:               Patient has marked left upper and left lower extremity ecchymosis and swelling first noted on 05/21/2018.    Evaluation by vascular surgery negative.               Appreciate orthopedics consult.  Etiology felt secondary to hemorrhage into muscle and soft tissue   (spontaneous vs trauma).  No worsening in left arm or leg edema (no compartment syndrome).              Platelet transfusion threshold 50,000.              4.  Code status:  DNR.   Lequita Asal, MD  05/26/2018, 9:18 AM

## 2018-05-27 ENCOUNTER — Inpatient Hospital Stay: Payer: Medicare Other

## 2018-05-27 DIAGNOSIS — Z7951 Long term (current) use of inhaled steroids: Secondary | ICD-10-CM | POA: Diagnosis not present

## 2018-05-27 DIAGNOSIS — E785 Hyperlipidemia, unspecified: Secondary | ICD-10-CM | POA: Diagnosis not present

## 2018-05-27 DIAGNOSIS — Z9181 History of falling: Secondary | ICD-10-CM | POA: Diagnosis not present

## 2018-05-27 DIAGNOSIS — D598 Other acquired hemolytic anemias: Secondary | ICD-10-CM | POA: Diagnosis not present

## 2018-05-27 DIAGNOSIS — C9290 Myeloid leukemia, unspecified, not having achieved remission: Secondary | ICD-10-CM | POA: Diagnosis not present

## 2018-05-27 DIAGNOSIS — R5381 Other malaise: Secondary | ICD-10-CM | POA: Diagnosis not present

## 2018-05-27 DIAGNOSIS — E440 Moderate protein-calorie malnutrition: Secondary | ICD-10-CM | POA: Diagnosis not present

## 2018-05-27 DIAGNOSIS — D709 Neutropenia, unspecified: Secondary | ICD-10-CM | POA: Diagnosis not present

## 2018-05-27 DIAGNOSIS — I10 Essential (primary) hypertension: Secondary | ICD-10-CM | POA: Diagnosis not present

## 2018-05-27 DIAGNOSIS — Z9049 Acquired absence of other specified parts of digestive tract: Secondary | ICD-10-CM | POA: Diagnosis not present

## 2018-05-27 DIAGNOSIS — E119 Type 2 diabetes mellitus without complications: Secondary | ICD-10-CM | POA: Diagnosis not present

## 2018-05-27 DIAGNOSIS — Z66 Do not resuscitate: Secondary | ICD-10-CM | POA: Diagnosis not present

## 2018-05-27 DIAGNOSIS — Z6823 Body mass index (BMI) 23.0-23.9, adult: Secondary | ICD-10-CM | POA: Diagnosis not present

## 2018-05-27 DIAGNOSIS — N181 Chronic kidney disease, stage 1: Secondary | ICD-10-CM | POA: Diagnosis not present

## 2018-05-27 DIAGNOSIS — D62 Acute posthemorrhagic anemia: Secondary | ICD-10-CM | POA: Diagnosis not present

## 2018-05-27 DIAGNOSIS — K219 Gastro-esophageal reflux disease without esophagitis: Secondary | ICD-10-CM | POA: Diagnosis not present

## 2018-05-27 DIAGNOSIS — D599 Acquired hemolytic anemia, unspecified: Secondary | ICD-10-CM | POA: Diagnosis not present

## 2018-05-27 DIAGNOSIS — E43 Unspecified severe protein-calorie malnutrition: Secondary | ICD-10-CM | POA: Diagnosis not present

## 2018-05-27 DIAGNOSIS — I129 Hypertensive chronic kidney disease with stage 1 through stage 4 chronic kidney disease, or unspecified chronic kidney disease: Secondary | ICD-10-CM | POA: Diagnosis not present

## 2018-05-27 DIAGNOSIS — S8010XA Contusion of unspecified lower leg, initial encounter: Secondary | ICD-10-CM | POA: Diagnosis not present

## 2018-05-27 DIAGNOSIS — K7460 Unspecified cirrhosis of liver: Secondary | ICD-10-CM | POA: Diagnosis not present

## 2018-05-27 DIAGNOSIS — M6281 Muscle weakness (generalized): Secondary | ICD-10-CM | POA: Diagnosis not present

## 2018-05-27 DIAGNOSIS — R262 Difficulty in walking, not elsewhere classified: Secondary | ICD-10-CM | POA: Diagnosis not present

## 2018-05-27 DIAGNOSIS — R609 Edema, unspecified: Secondary | ICD-10-CM | POA: Diagnosis not present

## 2018-05-27 DIAGNOSIS — E871 Hypo-osmolality and hyponatremia: Secondary | ICD-10-CM | POA: Diagnosis not present

## 2018-05-27 DIAGNOSIS — S7012XA Contusion of left thigh, initial encounter: Secondary | ICD-10-CM | POA: Diagnosis not present

## 2018-05-27 DIAGNOSIS — E1122 Type 2 diabetes mellitus with diabetic chronic kidney disease: Secondary | ICD-10-CM | POA: Diagnosis not present

## 2018-05-27 DIAGNOSIS — Z7984 Long term (current) use of oral hypoglycemic drugs: Secondary | ICD-10-CM | POA: Diagnosis not present

## 2018-05-27 DIAGNOSIS — Z9221 Personal history of antineoplastic chemotherapy: Secondary | ICD-10-CM | POA: Diagnosis not present

## 2018-05-27 DIAGNOSIS — H409 Unspecified glaucoma: Secondary | ICD-10-CM | POA: Diagnosis not present

## 2018-05-27 DIAGNOSIS — Z452 Encounter for adjustment and management of vascular access device: Secondary | ICD-10-CM | POA: Diagnosis not present

## 2018-05-27 DIAGNOSIS — R300 Dysuria: Secondary | ICD-10-CM | POA: Diagnosis not present

## 2018-05-27 DIAGNOSIS — S8012XA Contusion of left lower leg, initial encounter: Secondary | ICD-10-CM | POA: Diagnosis not present

## 2018-05-27 DIAGNOSIS — D696 Thrombocytopenia, unspecified: Secondary | ICD-10-CM | POA: Diagnosis not present

## 2018-05-27 DIAGNOSIS — K7689 Other specified diseases of liver: Secondary | ICD-10-CM | POA: Diagnosis not present

## 2018-05-27 DIAGNOSIS — S300XXA Contusion of lower back and pelvis, initial encounter: Secondary | ICD-10-CM | POA: Diagnosis not present

## 2018-05-27 DIAGNOSIS — D61818 Other pancytopenia: Secondary | ICD-10-CM | POA: Diagnosis not present

## 2018-05-27 DIAGNOSIS — K5730 Diverticulosis of large intestine without perforation or abscess without bleeding: Secondary | ICD-10-CM | POA: Diagnosis not present

## 2018-05-27 DIAGNOSIS — D630 Anemia in neoplastic disease: Secondary | ICD-10-CM | POA: Diagnosis not present

## 2018-05-27 DIAGNOSIS — J4540 Moderate persistent asthma, uncomplicated: Secondary | ICD-10-CM | POA: Diagnosis not present

## 2018-05-27 DIAGNOSIS — S40022A Contusion of left upper arm, initial encounter: Secondary | ICD-10-CM | POA: Diagnosis not present

## 2018-05-27 DIAGNOSIS — C9200 Acute myeloblastic leukemia, not having achieved remission: Secondary | ICD-10-CM | POA: Diagnosis not present

## 2018-05-27 DIAGNOSIS — J45909 Unspecified asthma, uncomplicated: Secondary | ICD-10-CM | POA: Diagnosis not present

## 2018-05-27 DIAGNOSIS — D649 Anemia, unspecified: Secondary | ICD-10-CM | POA: Diagnosis not present

## 2018-05-27 DIAGNOSIS — S7002XD Contusion of left hip, subsequent encounter: Secondary | ICD-10-CM | POA: Diagnosis not present

## 2018-05-27 DIAGNOSIS — R279 Unspecified lack of coordination: Secondary | ICD-10-CM | POA: Diagnosis not present

## 2018-05-27 LAB — TYPE AND SCREEN
ABO/RH(D): A POS
Antibody Screen: NEGATIVE
Unit division: 0
Unit division: 0
Unit division: 0
Unit division: 0
Unit division: 0

## 2018-05-27 LAB — BPAM RBC
Blood Product Expiration Date: 201909252359
Blood Product Expiration Date: 201910062359
Blood Product Expiration Date: 201910072359
Blood Product Expiration Date: 201910072359
Blood Product Expiration Date: 201910072359
ISSUE DATE / TIME: 201909161638
ISSUE DATE / TIME: 201909162113
ISSUE DATE / TIME: 201909170950
ISSUE DATE / TIME: 201909180924
ISSUE DATE / TIME: 201909181423
Unit Type and Rh: 5100
Unit Type and Rh: 600
Unit Type and Rh: 6200
Unit Type and Rh: 6200
Unit Type and Rh: 6200

## 2018-05-27 LAB — CBC
HCT: 22.5 % — ABNORMAL LOW (ref 35.0–47.0)
Hemoglobin: 7.8 g/dL — ABNORMAL LOW (ref 12.0–16.0)
MCH: 31.1 pg (ref 26.0–34.0)
MCHC: 34.8 g/dL (ref 32.0–36.0)
MCV: 89.5 fL (ref 80.0–100.0)
Platelets: 41 10*3/uL — ABNORMAL LOW (ref 150–440)
RBC: 2.51 MIL/uL — ABNORMAL LOW (ref 3.80–5.20)
RDW: 14.7 % — ABNORMAL HIGH (ref 11.5–14.5)
WBC: 13 10*3/uL — ABNORMAL HIGH (ref 3.6–11.0)

## 2018-05-27 LAB — GLUCOSE, CAPILLARY
Glucose-Capillary: 161 mg/dL — ABNORMAL HIGH (ref 70–99)
Glucose-Capillary: 160 mg/dL — ABNORMAL HIGH (ref 70–99)
Glucose-Capillary: 158 mg/dL — ABNORMAL HIGH (ref 70–99)

## 2018-05-27 MED FILL — VENCLEXTA 100 MG TABLET: 30 days supply | Qty: 120 | Fill #0 | Status: AC

## 2018-05-27 NOTE — Progress Notes (Signed)
Patient transferred to [facility] via [facility] transportation at 2034. Report given to Claiborne Billings, RN on Oncology unit [phone].

## 2018-05-27 NOTE — Progress Notes (Signed)
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  Subjective:  She denies any bruising or bleeding.  No changes in left arm and leg.  Social History: The patient is accompanied by her husband today.  Allergies:  Allergies  Allergen Reactions  . Hydrocodone Nausea And Vomiting and Other (See Comments)    Vertigo but patient takes hydrocodone/acetaminophen outpatient    Scheduled Medications: . carvedilol  6.25 mg Oral BID WC  . Influenza vac split quadrivalent PF  0.5 mL Intramuscular Tomorrow-1000  . insulin aspart  0-5 Units Subcutaneous QHS  . insulin aspart  0-9 Units Subcutaneous TID WC  . latanoprost  1 drop Right Eye QHS  . lisinopril  10 mg Oral Daily  . pantoprazole  40 mg Oral BID AC  . venetoclax  400 mg Oral Q supper    Review of Systems: GENERAL:  Fatigue.  No fevers or sweats. PERFORMANCE STATUS (ECOG):  2-3 HEENT:  No visual changes, runny nose, sore throat, mouth sores or tenderness. Lungs: No shortness of breath or cough.  No hemoptysis. Cardiac:  No chest pain, palpitations, orthopnea, or PND. GI: Appetite good.  No nausea, vomiting, diarrhea, constipation, melena or hematochezia. GU:  No urgency, frequency, dysuria, or hematuria. Musculoskeletal:  No change in left arm and leg.  Extremities:  Swelling above lower extremity stocking.  Less swelling in hand. Skin:  Jaundice.  No change in bruising. Neuro:  No headache, numbness or weakness, balance or coordination issues. Endocrine:  Diabetes.  No thyroid issues, hot flashes or night sweats. Psych:  No mood changes, depression or anxiety. Pain:  No focal pain. Review of systems:  All other systems  reviewed and found to be negative.   Physical Exam: Blood pressure 120/67, pulse (!) 103, temperature 98.1 F (36.7 C), temperature source Oral, resp. rate 18, height _0  (1.6 m), weight 124 lb 1.6 oz (56.3 kg), SpO2 97 %.  GENERAL:  Thin elderly woman sitting comfortably eating breakfast on the medical unit in no acute distress. MENTAL STATUS:  Alert and oriented to person, place and time. HEAD:  Pearline Cables hair.  Normocephalic, atraumatic, face symmetric, no Cushingoid features. EYES:  Brown eyes.  Pupils equal round and reactive to light and accomodation.  No conjunctivitis or scleral icterus. RESPIRATORY:  Clear to auscultation without rales, wheezes or rhonchi. CARDIOVASCULAR:  Regular rate and rhythm without murmur, rub or gallop. ABDOMEN:  Soft, non-tender, with active bowel sounds, and no hepatosplenomegaly.  No masses. SKIN:  Jaundice.  Stable flank and left upper extremity ecchymosis. EXTREMITIES: Left thigh edema above stocking.  Left proximal upper extremity edema.  No edema in hand. NEUROLOGICAL: Unremarkable. PSYCH:  Appropriate.    Results for orders placed or performed during the hospital encounter of 05/24/18 (from the past 48 hour(s))  Glucose, capillary     Status: Abnormal   Collection Time: 05/25/18  9:52 PM  Result Value Ref Range   Glucose-Capillary 144 (H) 70 - 99 mg/dL  CBC     Status: Abnormal   Collection Time: 05/26/18  4:00 AM  Result Value Ref Range   WBC 12.4 (H) 3.6 - 11.0 K/uL   RBC 1.69 (L) 3.80 - 5.20 MIL/uL   Hemoglobin 5.4 (  L) 12.0 - 16.0 g/dL    Comment: RESULT REPEATED AND VERIFIED   HCT 15.6 (L) 35.0 - 47.0 %   MCV 92.5 80.0 - 100.0 fL   MCH 31.8 26.0 - 34.0 pg   MCHC 34.4 32.0 - 36.0 g/dL   RDW 16.1 (H) 11.5 - 14.5 %   Platelets 55 (L) 150 - 440 K/uL    Comment: Performed at [facility], [address] [place], [place] [address]  Basic metabolic panel     Status: Abnormal   Collection Time: 05/26/18  4:00 AM  Result Value Ref  Range   Sodium 127 (L) 135 - 145 mmol/L   Potassium 3.9 3.5 - 5.1 mmol/L   Chloride 92 (L) 98 - 111 mmol/L   CO2 29 22 - 32 mmol/L   Glucose, Bld 152 (H) 70 - 99 mg/dL   BUN 18 8 - 23 mg/dL   Creatinine, Ser 0.49 0.44 - 1.00 mg/dL   Calcium 7.6 (L) 8.9 - 10.3 mg/dL   GFR calc non Af Amer >60 >60 mL/min   GFR calc Af Amer >60 >60 mL/min    Comment: (NOTE) The eGFR has been calculated using the CKD EPI equation. This calculation has not been validated in all clinical situations. eGFR's persistently <60 mL/min signify possible Chronic Kidney Disease.    Anion gap 6 5 - 15    Comment: Performed at [facility], [address] [place], [place] [address]  Lactate dehydrogenase     Status: Abnormal   Collection Time: 05/26/18  4:00 AM  Result Value Ref Range   LDH 452 (H) 98 - 192 U/L    Comment: Performed at [facility], [address] [place], [place] [address]  Reticulocytes     Status: Abnormal   Collection Time: 05/26/18  4:00 AM  Result Value Ref Range   Retic Ct Pct 9.3 (H) 0.4 - 3.1 %   RBC. 1.69 (L) 3.80 - 5.20 MIL/uL   Retic Count, Absolute 157.2 19.0 - 183.0 K/uL    Comment: Performed at [facility], [address], [place], [place] [address]  Prepare RBC     Status: None   Collection Time: 05/26/18  6:53 AM  Result Value Ref Range   Order Confirmation      ORDER PROCESSED BY BLOOD BANK Performed at [facility], [address] [place], [place] [address]   Glucose, capillary     Status: Abnormal   Collection Time: 05/26/18  7:40 AM  Result Value Ref Range   Glucose-Capillary 138 (H) 70 - 99 mg/dL  Glucose, capillary     Status: Abnormal   Collection Time: 05/26/18 12:36 PM  Result Value Ref Range   Glucose-Capillary 148 (H) 70 - 99 mg/dL  Glucose, capillary     Status: Abnormal   Collection Time: 05/26/18  5:03 PM  Result Value Ref Range   Glucose-Capillary 148 (H) 70 - 99 mg/dL  Hemoglobin and hematocrit, blood      Status: Abnormal   Collection Time: 05/26/18  7:15 PM  Result Value Ref Range   Hemoglobin 8.4 (L) 12.0 - 16.0 g/dL    Comment: RESULT REPEATED AND VERIFIED   HCT 23.9 (L) 35.0 - 47.0 %    Comment: Performed at [facility], [address] [place], [place] [address]  Glucose, capillary     Status: Abnormal   Collection Time: 05/26/18  9:03 PM  Result Value Ref Range   Glucose-Capillary 185 (  H) 70 - 99 mg/dL  CBC     Status: Abnormal   Collection Time: 05/27/18  4:15 AM  Result Value Ref Range   WBC 13.0 (H) 3.6 - 11.0 K/uL   RBC 2.51 (L) 3.80 - 5.20 MIL/uL   Hemoglobin 7.8 (L) 12.0 - 16.0 g/dL   HCT 22.5 (L) 35.0 - 47.0 %   MCV 89.5 80.0 - 100.0 fL   MCH 31.1 26.0 - 34.0 pg   MCHC 34.8 32.0 - 36.0 g/dL   RDW 14.7 (H) 11.5 - 14.5 %   Platelets 41 (L) 150 - 440 K/uL    Comment: Performed at [facility], [address] [place], [place] [address]  Glucose, capillary     Status: Abnormal   Collection Time: 05/27/18  7:35 AM  Result Value Ref Range   Glucose-Capillary 161 (H) 70 - 99 mg/dL  Glucose, capillary     Status: Abnormal   Collection Time: 05/27/18 12:20 PM  Result Value Ref Range   Glucose-Capillary 160 (H) 70 - 99 mg/dL  Glucose, capillary     Status: Abnormal   Collection Time: 05/27/18  5:22 PM  Result Value Ref Range   Glucose-Capillary 158 (H) 70 - 99 mg/dL   No results found.  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  She presented with marked anemia and a rapid decline in hemoglobin over the past 3 days worrisome for bleeding or hemolysis.    She has received 4 units of PRBCs and 1 unit of pheresed platelets.  Hemoglobin increased from 6.2 to 7.1 after 2 units.  Hemoglobin dropped to 5.4 on 05/26/2018, but has improved to 7.8 today after transfusion yesterday.  She has mild hyperbilirubinemia. Coombs is negative.    Symptomatically, she denies any changes.  Exam is stable.  Hemoglobin is 7.8.  Platelet count is 41,000.  Plan:   1.  Oncology:              Patient is day 22 s/p cycle #1 azacytidine and venetoclax.               Cycle #2 has been postponed secondary to current events (due 05/24/2018).               Blast count was 65% blasts on 05/24/2018.   Patient restarted venetoclax on 05/25/2018.  Discussed with patient plan to begin cycle #2 on 05/31/2018.  2.  Hematology:               Patient is transfusion dependent.  Blood products are leukopoor and irradiated.             Patient has received 4 units irradiated PRBCs and 1 unit of platelets.             Abdomen and pelvic CT revealed no retroperitoneal hemorrhage.  Known bleed into left arm and leg is stable.  Evaluation for hemolysis:  Coombs negative.  Haptoglobin pending.  LDH high (? hemolysis or secondary to active AML).             Current platelet count 41,000.    Patient received a platelet transfusion on 05/25/2016 with improvement from 26,000 to 63,000 (1 hour post platelet count).    Labs on 05/21/2018 revealed no evidence of DIC.  Awaiting transfer to [facility].  3.  Orthopedics:               Patient has improved left upper and left lower extremity ecchymosis and swelling first noted on 05/21/2018.    Evaluation by vascular surgery negative.               Etiology felt secondary to hemorrhage into muscle and soft tissue (spontaneous vs trauma).    Clinically, no worsening in left arm or leg edema (no compartment syndrome).               4.  Code status:  DNR.   Lequita Asal, MD  05/27/2018, 8:55 PM

## 2018-05-27 NOTE — Evaluation (Signed)
Physical Therapy Evaluation Patient Details Name: Julie Holland MRN: [id number] DOB: March 08, 1941 Today's Date: 05/27/2018   History of Present Illness  Pt admitted for anemia and is s/p 5 transfusions this admission. Hgb now elevated to 7.8. HIstory includes AML, arthritis, CKD, GERD, HTN, DM.  Of note, pt supposed to be transferred to [facility] for further care.  Clinical Impression  Pt is a pleasant 77 year old female who was admitted for anemia. Pt performs bed mobility with max assist and inability to perform transfers/ambulation this date. Pt becomes dizzy with all mobility and reports severe pain in L hip/leg. Pt demonstrates deficits with strength/pain/cognition/dizziness. Pt is currently not at baseline level. Would benefit from skilled PT to address above deficits and promote optimal return to PLOF; recommend transition to STR upon discharge from acute hospitalization.       Follow Up Recommendations SNF    Equipment Recommendations  None recommended by PT    Recommendations for Other Services       Precautions / Restrictions Precautions Precautions: Fall Restrictions Weight Bearing Restrictions: No      Mobility  Bed Mobility Overal bed mobility: Needs Assistance Bed Mobility: Supine to Sit     Supine to sit: Max assist     General bed mobility comments: assistance need to scoot towards EOB. Unable to fully maintain upright posture due to L hip pain. Pt also complains of severe dizziness when sitting and only able to tolerate for 10-15 seconds and then needs to lie down. Further mobility deferred.  Transfers                 General transfer comment: transfers deferred due to dizziness and pain  Ambulation/Gait                Stairs            Wheelchair Mobility    Modified Rankin (Stroke Patients Only)       Balance Overall balance assessment: Needs assistance;History of Falls Sitting-balance support: Feet unsupported;Bilateral  upper extremity supported Sitting balance-Leahy Scale: Poor Sitting balance - Comments: unable to maintain upright posture                                     Pertinent Vitals/Pain Pain Assessment: Faces Faces Pain Scale: Hurts little more Pain Location: L hip Pain Descriptors / Indicators: Discomfort Pain Intervention(s): Limited activity within patient's tolerance;Repositioned    Home Living Family/patient expects to be discharged to:: Private residence Living Arrangements: Spouse/significant other Available Help at Discharge: Family;Available 24 hours/day Type of Home: House Home Access: Stairs to enter Entrance Stairs-Rails: Left Entrance Stairs-Number of Steps: 3 Home Layout: One level Home Equipment: Walker - 2 wheels;Cane - single point Additional Comments: pt is poor historian, most of history obtained from previous eval. When asked questions, pt begins to answer, then gets off topic and unable to finish answer.    Prior Function Level of Independence: Independent with assistive device(s)         Comments: "Pt amb w/ [facility] primarily but has 2WW at home. history of 2 falls." Again, poor historian, per patient recent falls. Reports she ambulates from room to room.     Hand Dominance        Extremity/Trunk Assessment   Upper Extremity Assessment Upper Extremity Assessment: Generalized weakness(B UE grossly 3+/5)    Lower Extremity Assessment Lower Extremity Assessment: Generalized weakness(R LE  grossly 4/5; L LE grossly 3/5)       Communication   Communication: No difficulties  Cognition Arousal/Alertness: Lethargic Behavior During Therapy: WFL for tasks assessed/performed Overall Cognitive Status: Impaired/Different from baseline                                        General Comments      Exercises Other Exercises Other Exercises: supine ther-ex performed including B LE ankle pumps, hip add squeezes, and hip abd/add.  All ther-ex performed x 10 reps with cga on R side and mod assist on L LE due to weakness and pain.   Assessment/Plan    PT Assessment Patient needs continued PT services  PT Problem List Decreased strength;Decreased activity tolerance;Decreased balance;Decreased mobility;Decreased cognition;Pain       PT Treatment Interventions Gait training;Therapeutic exercise;Balance training    PT Goals (Current goals can be found in the Care Plan section)  Acute Rehab PT Goals Patient Stated Goal: to feel better and have less swelling  PT Goal Formulation: With patient Time For Goal Achievement: 06/10/18 Potential to Achieve Goals: Good    Frequency Min 2X/week   Barriers to discharge        Co-evaluation               AM-PAC PT "6 Clicks" Daily Activity  Outcome Measure Difficulty turning over in bed (including adjusting bedclothes, sheets and blankets)?: Unable Difficulty moving from lying on back to sitting on the side of the bed? : Unable Difficulty sitting down on and standing up from a chair with arms (e.g., wheelchair, bedside commode, etc,.)?: Unable Help needed moving to and from a bed to chair (including a wheelchair)?: Total Help needed walking in hospital room?: Total Help needed climbing 3-5 steps with a railing? : Total 6 Click Score: 6    End of Session   Activity Tolerance: Patient limited by pain;Patient limited by fatigue Patient left: in bed;with bed alarm set Nurse Communication: Mobility status PT Visit Diagnosis: Muscle weakness (generalized) (M62.81);Difficulty in walking, not elsewhere classified (R26.2);Pain Pain - Right/Left: Left Pain - part of body: Leg    Time: 6701-4103 PT Time Calculation (min) (ACUTE ONLY): 18 min   Charges:   PT Evaluation $PT Eval Low Complexity: 1 Low PT Treatments $Therapeutic Exercise: 8-22 mins        Julie Holland, PT, DPT [phone]   Julie Holland 05/27/2018, 4:41 PM

## 2018-05-27 NOTE — Plan of Care (Signed)

## 2018-05-27 NOTE — Progress Notes (Signed)
[place] at [place] NAME: Julie Holland    MR#:  [id number]  DATE OF BIRTH:  [birth year]  SUBJECTIVE:  Awaiting transfer to [facility].  Husband is at bedside.  REVIEW OF SYSTEMS:     Review of Systems  Constitutional: Negative for fever, chills weight loss HENT: Negative for ear pain, nosebleeds, congestion, facial swelling, rhinorrhea, neck pain, neck stiffness and ear discharge.   Respiratory: Negative for cough, shortness of breath, wheezing  Cardiovascular: Negative for chest pain, palpitations and leg swelling.  Gastrointestinal: Negative for heartburn, abdominal pain, vomiting, diarrhea or consitpation Genitourinary: Negative for dysuria, urgency, frequency, hematuria Musculoskeletal: Negative for back pain or joint pain Neurological: Negative for dizziness, seizures, syncope, focal weakness,  numbness and headaches.  Hematological: Does not bruise/bleed easily.  Psychiatric/Behavioral: Negative for hallucinations, confusion, dysphoric mood  Skin: Ecchymosis left hand and leg and back   Tolerating Diet: yes      DRUG ALLERGIES:   Allergies  Allergen Reactions  . Hydrocodone Nausea And Vomiting and Other (See Comments)    Vertigo but patient takes hydrocodone/acetaminophen outpatient    VITALS:  Blood pressure 132/67, pulse 99, temperature 98.4 F (36.9 C), temperature source Oral, resp. rate (!) 22, height 5\' 3"  (1.6 m), weight 56.3 kg, SpO2 98 %.  PHYSICAL EXAMINATION:  Constitutional: Appears well-developed and well-nourished. No distress. HENT: Normocephalic. Marland Kitchen Oropharynx is clear and moist.  Eyes: Conjunctivae and EOM are normal. PERRLA, no scleral icterus.  Neck: Normal ROM. Neck supple. No JVD. No tracheal deviation. CVS: RRR, S1/S2 +, no murmurs, no gallops, no carotid bruit.  Pulmonary: Effort and breath sounds normal, no stridor, rhonchi, wheezes, rales.  Abdominal: Soft. BS +,  no distension, tenderness, rebound or  guarding.  Musculoskeletal: Normal range of motion. No edema and no tenderness.  Neuro: Alert. CN 2-12 grossly intact. No focal deficits. Skin: Ecchymosis left hand/arm and extends through left leg and back (slowly improving)  psychiatric: Normal mood and affect.      LABORATORY PANEL:   CBC Recent Labs  Lab 05/27/18 0415  WBC 13.0*  HGB 7.8*  HCT 22.5*  PLT 41*   ------------------------------------------------------------------------------------------------------------------  Chemistries  Recent Labs  Lab 05/24/18 0806 05/25/18 0459 05/26/18 0400  NA 130* 128* 127*  K 3.8 3.2* 3.9  CL 94* 93* 92*  CO2 28 29 29   GLUCOSE 197* 149* 152*  BUN 20 19 18   CREATININE 0.75 0.55 0.49  CALCIUM 8.2* 7.6* 7.6*  MG 1.4* 1.4*  --   AST 44*  --   --   ALT 22  --   --   ALKPHOS 86  --   --   BILITOT 3.0*  --   --    ------------------------------------------------------------------------------------------------------------------  Cardiac Enzymes No results for input(s): TROPONINI in the last 168 hours. ------------------------------------------------------------------------------------------------------------------  RADIOLOGY:  Ct Abdomen Pelvis Wo Contrast  Result Date: 05/25/2018 CLINICAL DATA:  Decreased hemoglobin level with marked swelling and bruising in the left arm and leg. History of AML. EXAM: CT ABDOMEN AND PELVIS WITHOUT CONTRAST TECHNIQUE: Multidetector CT imaging of the abdomen and pelvis was performed following the standard protocol without IV contrast. COMPARISON:  CT 04/16/2018. FINDINGS: Lower chest: Mild dependent atelectasis at both lung bases. There are trace bilateral pleural effusions and a trace pericardial effusion versus pericardial thickening, similar to previous study. Hepatobiliary: There is a stable partially calcified, but otherwise low-density lesion in the dome of the left hepatic lobe, measuring 3.4 cm on image 15/2.  An adjacent simple cyst on  image 14/2 is stable. No new or enlarging hepatic lesions identified. As before, there is intra and extrahepatic biliary dilatation post cholecystectomy. The common hepatic duct measures 13 mm in diameter. No evidence of calcified intraductal stone. Pancreas: Atrophied. There is a stable small calcification inferiorly in the pancreatic head which appears extrinsic to pancreatic duct and common bile duct. No pancreatic ductal dilatation or surrounding inflammation. Spleen: Normal in size without focal abnormality. Adrenals/Urinary Tract: Both adrenal glands appear normal. Both kidneys appear normal. No evidence of urinary tract calculus or hydronephrosis. The bladder appears normal. Stomach/Bowel: No evidence of bowel wall thickening, distention or surrounding inflammatory change. Vascular/Lymphatic: There are no enlarged abdominal or pelvic lymph nodes. Mild aortic and branch vessel atherosclerosis. Reproductive: Hysterectomy.  No evidence of adnexal mass. Other: There is no retroperitoneal hematoma. There is no ascites or free air. Musculoskeletal: There is progressive ill-defined enlargement of the left gluteus maximus muscle which most likely represents progressive hemorrhage in this clinical context. There is progressive subcutaneous edema throughout the visualized proximal left thigh and buttock. Edema extends superiorly into both flanks. However, no other focal fluid collections are seen. No acute osseous findings. IMPRESSION: 1. Progressive enlargement of the left gluteus maximus muscle, likely representing progressive hemorrhage or myositis. There is progressive edema throughout the left buttock and proximal thigh which may be due to superimposed cellulitis. 2. No evidence of retroperitoneal hematoma or hemoperitoneum. 3. No evidence of adenopathy or splenomegaly. 4. Intra and extrahepatic biliary dilatation of uncertain etiology, similar to recent prior CT. This is more than typically seen post  cholecystectomy. Recent labs demonstrate mild elevation of the total bilirubin level, but no significant elevation of the alkaline phosphatase or transaminase levels. Electronically Signed   By: Richardean Sale M.D.   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  1. Acute on chronic anemia due to large ecchymosis from spontaneous hemorrhage to left upper and lower extremity Patient is status post  5 unit PRBCs. Hemoglobin 7.8 this morning   Dr. Mike Gip discussed patient's case with her oncologist and recommendations are to transfer patient to [facility].   CT scan was performed which did not show evidence of retroperitoneal hematoma.   Lower extremity Dopplers were negative for DVT.  2. AML: Management as per oncology  3. 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 4. Essential hypertension: Continue lisinopril and Coreg  5. Diabetes: Continue sliding scale 6. Electrolyte: Replete as needed  7. Chronic kidney disease stage I: Creatinine at baseline Management plans discussed with the patient and she is in agreement.  CODE STATUS: DNR  TOTAL TIME TAKING CARE OF THIS PATIENT: 24 minutes.     POSSIBLE D/C today or tomorrow,transfer to [facility] dr Mike Gip arranged  Blakelee Allington M.D on 05/27/2018 at 10:13 AM  Between 7am to 6pm - Pager - [phone] After 6pm go to [email or web address] - password [email or web address]  Office  [phone]  CC: Primary care physician; [name], MD  Note: This dictation was prepared with Dragon dictation along with smaller phrase technology. Any transcriptional errors that result from this process are unintentional.

## 2018-05-28 ENCOUNTER — Ambulatory Visit
Admit: 2018-05-28 | Discharge: 2018-06-08 | Disposition: A | Discharge status: Skilled Nursing Facility | Payer: MEDICARE | Source: Other Acute Inpatient Hospital

## 2018-05-28 ENCOUNTER — Inpatient Hospital Stay: Payer: Medicare Other

## 2018-05-28 DIAGNOSIS — R609 Edema, unspecified: Principal | ICD-10-CM

## 2018-05-28 LAB — RETICULOCYTE ABSOLUTE COUNT, MANUAL: Lab: 138.7 — ABNORMAL HIGH

## 2018-05-28 LAB — BASIC METABOLIC PANEL
ANION GAP: 5 mmol/L — ABNORMAL LOW (ref 9–15)
ANION GAP: 6 mmol/L — ABNORMAL LOW (ref 9–15)
BLOOD UREA NITROGEN: 20 mg/dL (ref 7–21)
BLOOD UREA NITROGEN: 17 mg/dL (ref 7–21)
BUN / CREAT RATIO: 48
BUN / CREAT RATIO: 46
CALCIUM: 7.4 mg/dL — ABNORMAL LOW (ref 8.5–10.2)
CALCIUM: 7.8 mg/dL — ABNORMAL LOW (ref 8.5–10.2)
CHLORIDE: 87 mmol/L — ABNORMAL LOW (ref 98–107)
CHLORIDE: 89 mmol/L — ABNORMAL LOW (ref 98–107)
CO2: 29 mmol/L (ref 22.0–30.0)
CO2: 29 mmol/L (ref 22.0–30.0)
CREATININE: 0.42 mg/dL — ABNORMAL LOW (ref 0.60–1.00)
EGFR CKD-EPI AA FEMALE: >90 mL/min/{1.73_m2} (ref >=60)
EGFR CKD-EPI NON-AA FEMALE: >90 mL/min/{1.73_m2} (ref >=60)
EGFR CKD-EPI NON-AA FEMALE: >90 mL/min/{1.73_m2} (ref >=60)
GLUCOSE RANDOM: 165 mg/dL (ref 65–179)
POTASSIUM: 3.8 mmol/L (ref 3.5–5.0)
POTASSIUM: 4 mmol/L (ref 3.5–5.0)
SODIUM: 121 mmol/L — ABNORMAL LOW (ref 135–145)
SODIUM: 124 mmol/L — ABNORMAL LOW (ref 135–145)

## 2018-05-28 LAB — CBC W/ AUTO DIFF
HEMATOCRIT: 27.1 % — ABNORMAL LOW (ref 36.0–46.0)
HEMOGLOBIN: 6 g/dL — ABNORMAL LOW (ref 12.0–16.0)
HEMOGLOBIN: 9 g/dL — ABNORMAL LOW (ref 12.0–16.0)
MEAN CORPUSCULAR HEMOGLOBIN CONC: 33.1 g/dL (ref 31.0–37.0)
MEAN CORPUSCULAR HEMOGLOBIN: 31.9 pg (ref 26.0–34.0)
MEAN CORPUSCULAR HEMOGLOBIN: 29.8 pg (ref 26.0–34.0)
MEAN CORPUSCULAR VOLUME: 94.3 fL (ref 80.0–100.0)
MEAN CORPUSCULAR VOLUME: 90.1 fL (ref 80.0–100.0)
MEAN PLATELET VOLUME: 11.3 fL — ABNORMAL HIGH (ref 7.0–10.0)
MEAN PLATELET VOLUME: 11.9 fL — ABNORMAL HIGH (ref 7.0–10.0)
NUCLEATED RED BLOOD CELLS: 110 /100{WBCs} — ABNORMAL HIGH (ref <=4)
NUCLEATED RED BLOOD CELLS: 72 /100{WBCs} — ABNORMAL HIGH (ref <=4)
PLATELET COUNT: 38 10*9/L — ABNORMAL LOW (ref 150–440)
RED BLOOD CELL COUNT: 1.89 10*12/L — ABNORMAL LOW (ref 4.00–5.20)
RED CELL DISTRIBUTION WIDTH: 20.6 % — ABNORMAL HIGH (ref 12.0–15.0)
RED CELL DISTRIBUTION WIDTH: 19.7 % — ABNORMAL HIGH (ref 12.0–15.0)
WBC ADJUSTED: 11.5 10*9/L — ABNORMAL HIGH (ref 4.5–11.0)
WBC ADJUSTED: 12.8 10*9/L — ABNORMAL HIGH (ref 4.5–11.0)

## 2018-05-28 LAB — HEPATIC FUNCTION PANEL
ALBUMIN: 2.2 g/dL — ABNORMAL LOW (ref 3.5–5.0)
ALT (SGPT): 22 U/L (ref 15–48)
ALT (SGPT): 16 U/L (ref 15–48)
AST (SGOT): 26 U/L (ref 14–38)
AST (SGOT): 29 U/L (ref 14–38)
BILIRUBIN DIRECT: 0.7 mg/dL — ABNORMAL HIGH (ref 0.00–0.40)
BILIRUBIN DIRECT: 1 mg/dL — ABNORMAL HIGH (ref 0.00–0.40)
BILIRUBIN TOTAL: 2.8 mg/dL — ABNORMAL HIGH (ref 0.0–1.2)
PROTEIN TOTAL: 5.8 g/dL — ABNORMAL LOW (ref 6.5–8.3)

## 2018-05-28 LAB — MAGNESIUM
Magnesium:MCnc:Pt:Ser/Plas:Qn:: 1.3 — ABNORMAL LOW
Magnesium:MCnc:Pt:Ser/Plas:Qn:: 1.4 — ABNORMAL LOW

## 2018-05-28 LAB — HEMATOCRIT: Lab: 21.8 — ABNORMAL LOW

## 2018-05-28 LAB — PATHOLOGIST SMEAR INTERPRETATION

## 2018-05-28 LAB — BLASTS - REL (DIFF): Lab: 78

## 2018-05-28 LAB — PROTIME-INR: INR: 1.14

## 2018-05-28 LAB — EOSINOPHILS - ABS (DIFF): Lab: 0

## 2018-05-28 LAB — MANUAL DIFFERENTIAL
BASOPHILS - ABS (DIFF): 0 10*9/L (ref 0.0–0.1)
BLASTS - REL (DIFF): 84 % (ref <=0)
EOSINOPHILS - ABS (DIFF): 0.1 10*9/L (ref 0.0–0.4)
EOSINOPHILS - REL (DIFF): 1 %
LYMPHOCYTES - ABS (DIFF): 0.5 10*9/L — ABNORMAL LOW (ref 1.5–5.0)
LYMPHOCYTES - ABS (DIFF): 1.3 10*9/L — ABNORMAL LOW (ref 1.5–5.0)
LYMPHOCYTES - REL (DIFF): 4 %
LYMPHOCYTES - REL (DIFF): 10 %
MONOCYTES - ABS (DIFF): 0.9 10*9/L — ABNORMAL HIGH (ref 0.2–0.8)
MONOCYTES - REL (DIFF): 5 %
MONOCYTES - REL (DIFF): 7 %
NEUTROPHILS - ABS (DIFF): 0.8 10*9/L — ABNORMAL LOW (ref 2.0–7.5)
NEUTROPHILS - ABS (DIFF): 0.5 10*9/L — ABNORMAL LOW (ref 2.0–7.5)
NEUTROPHILS - REL (DIFF): 7 %
NEUTROPHILS - REL (DIFF): 4 %

## 2018-05-28 LAB — ALKALINE PHOSPHATASE: Alkaline phosphatase:CCnc:Pt:Ser/Plas:Qn:: 52

## 2018-05-28 LAB — CHLORIDE: Chloride:SCnc:Pt:Ser/Plas:Qn:: 89 — ABNORMAL LOW

## 2018-05-28 LAB — MEAN CORPUSCULAR HEMOGLOBIN: Lab: 29.8

## 2018-05-28 LAB — MEAN CORPUSCULAR VOLUME: Lab: 94.3

## 2018-05-28 LAB — CALCIUM: Calcium:MCnc:Pt:Ser/Plas:Qn:: 7.4 — ABNORMAL LOW

## 2018-05-28 LAB — LACTATE DEHYDROGENASE: Lactate dehydrogenase:CCnc:Pt:Ser/Plas:Qn:: 1334 — ABNORMAL HIGH

## 2018-05-28 LAB — URIC ACID
URIC ACID: 3.3 mg/dL (ref 3.0–6.5)
Urate:MCnc:Pt:Ser/Plas:Qn:: 3.3

## 2018-05-28 LAB — PHOSPHORUS
Phosphate:MCnc:Pt:Ser/Plas:Qn:: 3.1
Phosphate:MCnc:Pt:Ser/Plas:Qn:: 3.3

## 2018-05-28 LAB — SMEAR - MD REQUEST

## 2018-05-28 LAB — PROTIME: Lab: 13.2 — ABNORMAL HIGH

## 2018-05-28 LAB — PLATELET COUNT
Lab: 63 — ABNORMAL LOW
Lab: 76 — ABNORMAL LOW

## 2018-05-28 LAB — D-DIMER QUANTITATIVE (CH,ML,PD,ET): Lab: 4033 — ABNORMAL HIGH

## 2018-05-28 LAB — HEPARIN CORRELATION: Lab: 0.2

## 2018-05-28 LAB — ALBUMIN: Albumin:MCnc:Pt:Ser/Plas:Qn:: 2.5 — ABNORMAL LOW

## 2018-05-28 LAB — OSMOLALITY MEASURED: Osmolality:Osmol:Pt:Ser/Plas:Qn:: 256 — ABNORMAL LOW

## 2018-05-28 LAB — LACTATE BLOOD VENOUS: Lactate:SCnc:Pt:BldV:Qn:: 1.4

## 2018-05-28 LAB — FIBRINOGEN LEVEL: Lab: 445 — ABNORMAL HIGH

## 2018-05-28 LAB — HAPTOGLOBIN: Haptoglobin:MCnc:Pt:Ser/Plas:Qn:: <20 — ABNORMAL LOW

## 2018-05-28 NOTE — Unmapped (Signed)
Hematology Consult Note    Requesting Attending Physician :  Gerre Couch, *  Service Requesting Consult : Oncology/Hematology (MDE)  Reason for Consult: Anemia/thrombocytopenia with transfusion dependent  Primary Hematologist/oncologist: Dr. Lonni Fix    Assessment: Tracey Lang is a 77 y.o. 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 She is currently transfusion dependent and with labs concerning for hemolysis.  Hematology was consulted for assistance with anemia secondary to acute blood loss and hemolysis.      Patient has significant hematoma, originating from L gluteal region and extending along LLE, LUE, and abdomen.  She has required 7 units of pRBCs since hospitalization.  She is transfusion dependent at baseline in setting of AML, and prior to this admission received PRBCs as outpatient on 9/11.  We have reviewed the peripheral smear which (in addition to known blasts) demonstrated polychromasia, nRBCs, rare RBC fragments, loss of central pallor, but no evidence of schistocytes, spherocytes, bite cells, or blister cells.  This argues against a TMA such as TTP.  Her DIC parameters are all normal.  She has a normal G6PD and no culprit meds that would precipitate drug-induced hemolysis.  We did discuss the possibility of acute or delayed transfusion reaction.  However, her hemoglobin has risen appropriately in response to most recent transfusion. 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  At this point, we feel that she has lab evidence of hemolysis from dissolution of organized hematoma.  Finally, despite the overwhelming number of nucleated RBCs, she still has hypoproliferative absolute reticulocyte index, attributable to underlying AML.     Problem List:  Acute on chronic anemia - multifactorial 2/2 acute blood loss anemia and hemolysis from resorption of significant hematoma     Recommendations:   - Start folic acid 1 mg daily  - Please obtain UA.  If no gross hematuria, please start tranexamic acid 1300 mg BID  - Transfuse for Hgb < 7, will transfuse for PLT < 50 given tremendous hematoma   - Please check CBC daily  - Agree with additional imaging  - Further management per primary team    This patient has been seen and discussed with Dr. Anise Salvo. These recommendations were discussed with the primary team.  We will sign off at this time.      Please contact the hematology fellow at [phone] with any further questions.    Marchelle Folks Oanh Devivo   05/28/2018 12:06 PM   Hematology/Oncology Fellow    -------------------------------------------------------------    HPI: Tracey Lang is a 77 y.o. 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 She is currently transfusion dependent and with labs concerning for hemolysis.  She is being seen at the request of Dr. 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      Patient underwent induction chemotherapy with azacitidine + venetoclax (C1D1 04/23/18).  She had relatively uncomplicated course and was discharged to SNF.  She was pancytopenic  at discharge (consistent with disease) with WBC 1, ANC 0, hemoglobin 9, and platelet 50.  Discharge summary notes that patient had large flank ecchymosis related to fall. Plan was for follow up and locally to initiate cycle to on 9/16 and follow up with Dr. Lonni Fix after re-staging marrow.    However, patient presented to OSH on 9/16 with severe left upper and left lower extremity edema as well as anemia.  She had upper extremity arterial and venous dopplers and LE PVLs, all of which were negative for clot.  She also had LE PVLs.  She was found to have large gluteal hematoma with active bleeding into left arm and left thigh, suspected to be related to recent trauma.  She has been transfusion dependent at OSH, requiring 5 units pRBCs from 9/16 - 9/18.  She also required 1 unit of platelets to keep platelets >50.      On arrival to [facility], hemoglobin 6, platelets 30.  She is being transfusing 2 units of PRBCs and 1 unit of platelets in response.  Hemolysis labs notable for total bilirubin 3.3, indirect bilirubin 2.3, undetectable haptoglobin, LDH 1334.  DIC labs normal (PT 13.2, INR 1.14, aPTT 31.6, D-dimer 4000, fibrinogen 445).      Review of Systems:   12 point review of systems negative other than as noted in HPI and assessment and plan.     Medical, Surgical, Social, and Family History reviewed and updated.    Social History     Social History Narrative   ??? Not on file       Allergies: is allergic to hydrocodone.    Medications:   Meds:  ??? fluticasone propion-salmeterol  1 puff Inhalation BID   ??? insulin lispro  0-12 Units Subcutaneous ACHS   ??? latanoprost  1 drop Right Eye Nightly   ??? levoFLOXacin  500 mg Oral Q24H SCH   ??? magnesium oxide  400 mg Oral BID   ??? pantoprazole  40 mg Oral BID   ??? polyethylene glycol  17 g Oral Daily   ??? senna  2 tablet Oral Nightly   ??? valACYclovir  500 mg Oral Daily     Continuous Infusions:  ??? sodium chloride 20 mL/hr (05/27/18 2312)     PRN Meds:.acetaminophen, albuterol, dextrose, melatonin, MORPhine injection, oxyCODONE **OR** oxyCODONE    Objective:   Vitals: Temp:  [35.8 ??C (96.5 ??F)-37.2 ??C (98.9 ??F)] 36.5 ??C (97.7 ??F)  Heart Rate:  [52-102] 96  Resp:  [16-18] 18  BP: (105-143)/(51-62) 137/60  MAP (mmHg):  [94] 94  SpO2:  [97 %-100 %] 98 %    Physical Exam:    GEN: Pleasant, well-appearing but feeble woman, trace jaundice  HEENT: PERRL, sclerae anicteric, conjunctiva clear, moist mucous membranes, no oral lesions or exudates  NECK: Supple, no JVD  CV: Normal rate, regular rhythm, normal S1 and S2, no murmurs, rubs, or gallops; extremities warm, well-perfused, no lower extremity edema  RESP: Lungs clear to auscultation bilaterally, no wheezes, ronchi or rales, normal work of breathing  GI: Soft, non-tender, non-distended, normoactive bowel sounds, no masses or organomegaly  EXT: significant tense edema of LLE, tender to palpation  MSK:  No bony pain or tenderness.   SKIN AND SUBCUTANEOUS TISSUES: Impressive hematoma throughout left side of her body and extending across her abdomen and buttocks, ecchymosis at various stages of healing  NEURO: Alert and oriented, speech intact, following commands, moving all extremities well, no focal deficits appreciated  PSYCH: Normal mood and  appropriate affect    Test Results  Recent Labs     05/27/18  2253 05/28/18  1059   WBC 11.5*  --    NEUTROABS 0.8*  --    HGB 6.0* 9.0*   PLT 38* 31*       Imaging: Radiology studies were personally reviewed

## 2018-05-28 NOTE — Unmapped (Signed)
Adult Nutrition Consult     Visit Type: MD Consult  Reason for Visit:  Assessment    ASSESSMENT:   HPI & PMH: Tracey Lang is a 77yo F with PMHx of AML??s/p 1 cycle??azacytidine + venetoclax, T2DM, HTN, GERD, who presented to OSH on 9/16 w/ anemia  and massive swelling of LUE and LLE found to have left gluteal hematoma. She has since become transfusion dependent, being transferred to [facility] for further care.   Past Medical History:   Diagnosis Date   ??? Asthma    ??? GERD (gastroesophageal reflux disease)    ??? Hyperlipidemia    ??? Hypertension    ??? 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 Type 2 diabetes mellitus (CMS-HCC)        Nutrition Hx: Pt reports poor appetite for at least 1 month. She reports eating about 1/2 of her meals. She takes boost supplement once daily. She knows she has lost weight but is unsure of the amount. She reports eating some better since admission and hungry for breakfast. She reports unsure how her arm and leg got swollen.  Pt is able to feed herself with R hand per observation.     Nutritionally Pertinent Meds: sliding scale insulin  Labs: POC Glucose 195/139/204  Lab Results   Component Value Date    NA 124 (L) 05/28/2018    K 4.0 05/28/2018    CL 89 (L) 05/28/2018    CO2 29.0 05/28/2018    BUN 17 05/28/2018    CREATININE 0.37 (L) 05/28/2018    GLU 165 05/28/2018    CALCIUM 7.8 (L) 05/28/2018    ALBUMIN 2.5 (L) 05/28/2018    PHOS 3.3 05/28/2018        Skin:   Patient Lines/Drains/Airways Status    Active Wounds     None               Current nutrition therapy order:   Nutrition Orders          Supplement # of Products PER Serving: 1; Standard High Calorie 2xd PC starting at 09/20 1800    Nutrition Therapy General (Regular) starting at 09/19 2156           Anthropometric Data:  -- Height:   165.1cm  -- Last recorded weight: 52.7 kg (116 lb 1.6 oz)  -- Admission weight: 52.7kg  -- IBW:  57kg  -- Percent IBW: 93%  -- BMI: Body mass index is 19.32 kg/m??.   -- Weight changes this admission:   Last 5 Recorded Weights    05/28/18 1031   Weight: 52.7 kg (116 lb 1.6 oz)      -- Weight history PTA: Since 04/29/18, not significant  Wt Readings from Last 10 Encounters:   05/28/18 52.7 kg (116 lb 1.6 oz)   05/10/18 52.7 kg (116 lb 1.6 oz)   04/29/18 53.9 kg (118 lb 13.3 oz)        Daily Estimated Nutrient Needs:   Energy: 1590 kcals [25-30 kcal/kg using admission body weight, 53 kg (05/28/18 1257)]  Protein: 64-80 gm [1.2-1.5 gm/kg using admission body weight, 53 kg (05/28/18 1257)]  Carbohydrate:   [45-60% of kcal]  Fluid:   [1 mL/kcal (maintenance)]     Nutrition Focused Physical Exam:  Fat Areas Examined  Orbital: Moderate loss  Upper Arm: Severe loss(Swelling L arm, R arm exam done)  Muscle Areas Examined  Temple: Moderate loss  Clavicle: Moderate loss  Dorsal Hand: Severe loss  Patellar: Moderate loss(Swelling L leg, R leg examined)  Anterior Thigh: Moderate loss  Posterior Calf: Moderate loss         Nutrition Evaluation  Nutrition Designation: Normal weight (BMI 18.50 - 24.99 kg/m2) (05/28/18 1258)     DIAGNOSIS:  Malnutrition Assessment using AND/ASPEN Clinical Characteristics:    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 Glucose levels currently being managed with sliding scale insulin. RD will hold any CHO restriction as oral intake is lower.  Pt will need to increase oral supplement intake and improve iron status due to fatigue.  She has had PRBC transfusions, unsafe to start iron supplementation in setting of iron overload.  WIll plan to work on diet modifications.   - Malnutrition related to fatigue/anemia, chemo treatment side effects as evidenced by muscle/fat losses, reduced oral intake.     GOALS:  Oral Intake:       - Patient to consume >50% of most meals per day.  - Patient to consume >75% of all oral supplements per day.     RECOMMENDATIONS AND INTERVENTIONS:  Plans ensure plus BID   Brief discussed order more beef and dark green veggies at meals  Added juice lunch and dinner meals. Pt ordering fruits am meals  Assist with meal set-up all meals      RD Follow Up Parameters:  1-2 times per week (and more frequent as indicated)     Ed Blalock, MS, RD, CSO, LDN  Pager # [phone]

## 2018-05-28 NOTE — Unmapped (Signed)
Daily Progress Note    Assessment/Plan:    Principal Problem:    Hematoma of left lower extremity  Active Problems:    Thrombocytopenia (CMS-HCC)    Type 2 diabetes mellitus, without long-term current use of insulin (CMS-HCC)    Essential hypertension    Hyperlipidemia    GERD (gastroesophageal reflux disease)    Acute myeloid leukemia (CMS-HCC)    Anemia  Resolved Problems:    * No resolved hospital problems. 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 She has since become transfusion dependent, being transferred to [facility] for further care.      Anemia, thrombocytopenia with bleeding into left gluteus maximus, left thigh, left arm: CT A/P from OSH left gluteal maximus hematoma. Vascular surgery at OSH evaluated LUE and determined there was no arterial insufficiency or venous clots. Lower PVLs negative for DVTs. No compartment syndrome per ortho, but they were worried for possible recent trauma. Patient does endorse a fall 2 months ago.  - Benign heme consulted, pending recs  - Outside CT scans sent to radiology for over-read  - PICC Line order placed  - LLE CT scan w/o contrast  - Will discuss evaluating delayed transfusion reaction with blood bank.  They do not believe anemia is secondary to transfusion reaction but did recommend transfusion reaction work-up since diagnosis cannot be fully excluded.  - UA ordered.  Can start tranexamic acid if no gross hematuria.    - von Willebrand work-up pending   - F/u DIC labs tomorrow   - Maintain active type and screen, blood consent has been obtained   - Transfuse for Hgb < 7, will transfuse for PLT < 50 given hematomas and likely active bleeding  - PT/OT consult  ??  Indirect hyperbilirubinemia: Labs consistent with hemolysis. Intra and extrahepatic biliary ductal dilation on CT of uncertain etiology   - Coombs test is negative.  Consider cold immunoglobulins.  - Abdominal ultrasound pending  ??  Hyponatremia: Sodium has slowly been down trending for last several days. 121 on admission  - follow up plasma Osm  - follow up urine Na, Urine Osm    AML: Still with high degree of blasts (~80%). Followed by Dr. Lonni Fix. S/p 1 cycle azacytidine+venetoclax, next cycle on hold. Tumor lysis labs reassuring.   - Dr. Lonni Fix aware of admission  - CTM WBC and % blasts  ??  HTN: SBP 100s-110s on arrival.   - Hold home carvedilol and lisinopril given lower BP and bleeding  ??  T2DM:   - hold home metformin  - SSI  ??  GERD:   - continue Protonix 40 mg BID    Dispo:  Pending stabilization of transfusion needs  ___________________________________________________________________    Subjective:  Patient denies recent fall.  She is unsure of the etiology of her bruising.      Labs/Studies:  Labs and Studies from the last 24hrs per EMR and Reviewed    Objective:  BP 132/61  - Pulse 97  - Temp 37.1 ??C (Oral)  - Resp 18  - Wt 52.7 kg (116 lb 1.6 oz)  - SpO2 98%  - BMI 19.32  kg/m??     General: A&O, NAD, lying in bed  Cv: RRR, no m/r/g  Resp: Non-labored, symmetrical chest wall expansion  GI: Soft, non-tender, non-distended  MSK: Normal ROM, swelling of LUE and LLE  Derm: Significant bruising along LUE and LLE.  Left thigh is tender to palpation.    Psych: Cooperative, appropriate mood and affect  Neuro: No apparent focal neurological deficits

## 2018-05-28 NOTE — Unmapped (Signed)
Care Management  Initial Transition Planning Assessment      Per H&P:  Tracey Lang is a 77yo F with PMHx of AML??s/p 1 cycle??azacytidine + venetoclax, T2DM, HTN, GERD, who presented to OSH on 9/16 w/ anemia  and massive swelling of LUE and LLE found to have left gluteal hematoma. She has since become transfusion dependent, being transferred to [facility] for further care.     Patient is known to CM from recent admission.        Medical Provider(s): [facility]  Reason for Admission: Admitting Diagnosis:  Neutropenic Fever  Past Medical History:   has a past medical history of Asthma, GERD (gastroesophageal reflux disease), Hyperlipidemia, Hypertension, SCC (squamous cell carcinoma), and Type 2 diabetes mellitus (CMS-HCC).  Past Surgical History:   has a past surgical history that includes Tonsillectomy; Total abdominal hysterectomy (1990); Epigastric hernia repair; and Cholecystectomy.   Previous admit date: 04/16/2018    Primary Insurance- Payor: Advertising copywriter MEDICARE ADV / Plan: UNITED [employer] MEDICARE ADV / Product Type: *No Product type* /   Secondary Insurance ??? None  Prescription Coverage ??? Yes  Preferred Pharmacy - [facility] - [place], [place] - [address]  [facility]  [facility]  [facility] - [place], [place] - [address]    Transportation home: Private vehicle  Level of function prior to admission: Requires Assistance                General  Care Manager assessed the patient by : Medical record review, Discussion with Clinical Care team  Orientation Level: Oriented X4  Who provides care at home?: Family member    Contact/Decision Maker        Extended Emergency Contact Information  Primary Emergency Contact: Detlefsen,paul sr  Mobile Phone: [phone]  Relation: Spouse  Interpreter needed? No    Legal Next of Kin / Guardian / POA / Advance Directives       Advance Directive (Medical Treatment)  Does patient have an advance directive covering medical treatment?: Patient has advance directive covering medical treatment, copy in chart.  Information provided on advance directive:: Yes  Patient requests assistance:: No         Patient Information    Lives with: Spouse/significant other    Type of Residence: Private residence     [address] [place] [address]   [phone] (home)              Support Systems: Spouse    Responsibilities/Dependents at home?: No    Home Care services in place prior to admission?: No                  Equipment Currently Used at Home: walker, standard, glucometer       Currently receiving outpatient dialysis?: No       Financial Information       Need for financial assistance?: Yes(CM notes referral to outpatient SW for assistance with financial concerns.)  Type of financial assistance required: Community resources    Social Determinants of Health  Social Determinants of Health were addressed in provider documentation.  Please refer to patient history.    Discharge Needs Assessment  Concerns to be Addressed: discharge planning, home safety    Clinical Risk Factors: > 65, Principal Diagnosis: Cancer, Stroke, COPD, Heart Failure, AMI, Pneumonia, Joint Replacment, Functional Limitations, Multiple Diagnoses (Chronic)    Barriers to taking medications:  No    Prior overnight hospital stay or ED visit in last 90 days: Yes(8/9 thru 8/23 new AML)    Readmission Within the Last 30 Days: no previous admission in last 30 days         Anticipated Changes Related to Illness: inability to care for self    Equipment Needed After Discharge: none    Discharge Facility/Level of Care Needs:  N/A    Readmission  Risk of Unplanned Readmission Score: UNPLANNED READMISSION SCORE: 22%  Readmitted Within the Last 30 Days? Yes  Patient at risk for readmission?: No    Discharge Plan: TBD    Screen findings are: Care Manager reviewed the plan of the patient's care with the Multidisciplinary Team. No discharge planning needs identified at this time. Care Manager will continue to manage plan and monitor patient's progress with the team.    Expected Discharge Date: 06/02/18    Expected Transfer from Critical Care: (N/A)    Patient and/or family were provided with choice of facilities / services that are available and appropriate to meet post hospital care needs?: No       Initial Assessment complete?: Yes

## 2018-05-28 NOTE — Unmapped (Signed)
PICC LINE INSERTION PROCEDURE NOTE    Indications:  Poor Vascular Access    Consent/Time Out:    Risks, benefits and alternatives discussed with patient.  Written consent was obtained prior to the procedure and is detailed in the medical record.  Prior to the start of the procedure, a time out was taken and the identity of the patient was confirmed via name, medical record number and  date of birth.  The availability of the correct equipment was verified.    Procedure Details:  The vein was identified and measured for appropriate catheter length. Maximum sterile techniques was utilized.  Sterile field prepared with necessary supplies and equipment. Insertion site was prepped with chlorhexidine solution and allowed to dry. Lidocaine 2 mL subcutaneously and intradermally administered to insertion site.  The catheter was primed with normal saline.  A 5 FR Double lumen was inserted to the R Basilic vein with 1 insertion attempt(s).  Catheter aspirated, 5 mL blood return present.  The catheter was then flushed with 20 mL of normal saline.   Insertion site cleansed, and sterile dressing applied per manufacturer guidelines. The Central Line Checklist was referenced.  The catheter was inserted without difficulty by Waneta Martins RN and assisted by Madelyn Brunner RN.      Findings:  Manufacturer:  Bard  Lot #:  [id number]  CT Injectable (power):  Yes  Arm Circumference:  19.5  cm  Total catheter length:  36 cm.    External length:  0 cm.  Catheter trimmed:  yes  Port reserved:  No       Vein Size:   Right arm basilic vein compressible:  Yes, measurement:         Tip Placement Verification:   [employer] and Sherlock    Workup Time:    90 minutes    Recommendations:  PICC Brochure given to patient with teaching instruction.      See vein image below:

## 2018-05-28 NOTE — Unmapped (Signed)
Medicine History and Physical    Assessment/Plan:    Principal Problem:    Hematoma of left lower extremity  Active Problems:    Thrombocytopenia (CMS-HCC)    Type 2 diabetes mellitus, without Pessy Delamar-term current use of insulin (CMS-HCC)    Essential hypertension    Hyperlipidemia    GERD (gastroesophageal reflux disease)    Acute myeloid leukemia (CMS-HCC)    Anemia  Resolved Problems:    * No resolved hospital problems. 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 She has since become transfusion dependent, being transferred to [facility] for further care.     AML: Still with high degree of blasts (85%). Followed by Dr. Lonni Fix. S/p 1 cycle azacytidine+venetoclax, next cycle on hold. Tumor lysis labs reassuring.   - Touch base with Dr. Lonni Fix     Anemia, thrombocytopenia with bleeding into left gluteus maximus, left thigh, left arm: CT A/P from OSH without contrast negative for retroperitoneal hematoma, but revealed enlargement of left gluteus maximus muscle concerning for hematoma. Vascular surgery  evaluated left upper extremity and there is no arterial insufficiency or venous clots. Lower PVLs negative for DVTs. No compartment syndrome per ortho, but they were worried for possible recent trauma. Patient does endorse a fall. Fortunately, she is hemodynamically stable.  - Maintain active type and screen, blood consent has been obtained   - Transfuse for Hgb < 7, will transfuse for PLT < 50 given hematomas and likely active bleeding  - Consider repeat imaging of abd, left thigh, left arm in the am   - Consider benign heme consult  - Will need better IV access  - PT/OT consult    Indirect hyperbilirubinemia: Labs consistent with hemolysis.   - follow up Coombs test  - of note, there is intra and extrahepatic biliary ductal dilation on CT of uncertain etiology   - follow up abd [place]    Hyponatremia: Sodium has slowly been down trending for last several days. 121 on admission  - follow up plasma Osm  - follow up urine Na, Urine Osm    HTN: SBP 100s-110s on arrival.   - Hold home carvedilol and lisinopril given lower BP and bleeding    T2DM:   - hold home metformin  - SSI    GERD:   - continue Protonix 40 mg BID      ___________________________________________________________________    Chief Complaint:    Hematoma of left lower extremity    HPI:  Tracey Lang is a 77yo F with PMHx of AML s/p 1 cycle azacytidine + venetoclax, T2DM, HTN, GERD, who presented to OSH on 9/16 w/ anemia  and massive swelling of LUE and LLE found to have left gluteal hematoma. She has since become transfusion dependent, being transferred to [facility] for further care.     Patient presented to OSH with anemia and massive swelling of left upper and left lower extremity. She was found to have gluteal hematoma and likely active bleeding into left arm and left thigh, suspect due to recent trauma. She has been transfusion dependent at OSH, requiring 5 u pRBCs from 9/16-9/18. Also required 1 unit platelet transfusion to keep platelets >50.     On arrival patient is very tired, but hemodynamically stable. She has some pain in  her left hip. She states that she had a fall some time ago but cannot elaborate on the details. She denies shortness of breath, chest pain, diarrhea. She states her pain has been well controlled on oxy and prn IV morphine.     On arrival platelets 30, Hgb 6. Na 121. Hemodynamically stable.       Acute myeloid leukemia (CMS-HCC)    04/18/2018 Initial Diagnosis     Acute myeloid leukemia (CMS-HCC)      04/23/2018 - 05/20/2018 Chemotherapy     Chemotherapy Treatment    Treatment Goal Control   Line of Treatment First Line   Plan Name OP AML AZACITIDINE + VENETOCLAX   Start Date 04/23/2018   End Date 04/23/2018   Provider Pernell Dupre, MD   Chemotherapy azaCITIDine (VIDAZA) syringe 120 mg, 75 mg/m2 = 120 mg (100 % of original dose 75 mg/m2), Subcutaneous, Every 24 hours, 1 of 6 cycles  Dose modification: 75 mg/m2 (original dose 75 mg/m2, Cycle 0)  Administration: 120 mg (04/23/2018), 120 mg (04/24/2018), 120 mg (04/25/2018), 120 mg (04/26/2018), 120 mg (04/27/2018), 120 mg (04/28/2018), 120 mg (04/29/2018)  venetoclax (VENCLEXTA) tablet 100 mg, 100 mg (100 % of original dose 100 mg), Oral, Once, 1 of 1 cycle  Dose modification: 100 mg (original dose 100 mg, Cycle 0), 200 mg (original dose 200 mg, Cycle 0, Reason: Other (See Comments)), 400 mg (original dose 400 mg, Cycle 0, Reason: Other (See Comments))  Administration: 100 mg (04/23/2018), 200 mg (04/24/2018), 400 mg (04/25/2018), 400 mg (04/26/2018), 400 mg (04/27/2018), 400 mg (04/28/2018), 400 mg (04/29/2018)            Allergies:  Hydrocodone    Medications:   Prior to Admission medications    Medication Dose, Route, Frequency   acetaminophen (TYLENOL) 325 MG tablet 650 mg, Oral, Every 6 hours PRN   albuterol HFA 90 mcg/actuation inhaler 2 puffs, Inhalation, Every 6 hours PRN   amLODIPine (NORVASC) 5 MG tablet 5 mg, Oral, Daily (standard)   carboxymethylcellulose 1 % ophthalmic solution 1 drop, Both Eyes, 3 times a day (standard)   fluticasone propion-salmeterol (ADVAIR DISKUS) 100-50 mcg/dose diskus 1 puff, Inhalation, 2 times a day (standard)   latanoprost (XALATAN) 0.005 % ophthalmic solution 1 drop, Right Eye, Nightly   levoFLOXacin (LEVAQUIN) 500 MG tablet 500 mg, Oral, Every 24 hours   lisinopril (PRINIVIL,ZESTRIL) 10 MG tablet 10 mg, Oral, Daily (standard)   metFORMIN (GLUCOPHAGE) 850 MG tablet 850 mg, Oral, 2 times a day with meals   multivit,min/folic acid/hrb157 (ESTROVEN MAXIMUM STRENGTH ORAL) 1 tablet, Oral, Daily (standard)   multivitamin (TAB-A-VITE/THERAGRAN) per tablet 1 tablet, Oral, Daily (standard)   pantoprazole (PROTONIX) 40 MG tablet 40 mg, Oral, 2 times a day (standard)   polyethylene glycol (MIRALAX) 17 gram packet 17 g, Oral, Daily (standard)   posaconazole (NOXAFIL) 100 mg TbEC delayed released tablet Take 3 tablets twice daily for one day, then take 3 tablets once daily thereafter   posaconazole (NOXAFIL) 100 mg TbEC delayed released tablet 300 mg, Oral, Daily (standard)   senna (SENOKOT) 8.6 mg tablet 1 tablet, Oral, Nightly   simethicone (MYLICON) 80 MG chewable tablet 80 mg, Oral, Every 6 hours PRN   valACYclovir (VALTREX) 500 MG tablet 500 mg, Oral, Daily (standard)   venetoclax (VENCLEXTA) 100 mg tablet 400 mg, Oral, Daily (standard), Take with a meal and water. Do not chew, crush, or break tablets. Take as directed.       Medical History:  Past  Medical History:   Diagnosis Date   ??? Asthma    ??? GERD (gastroesophageal reflux disease)    ??? Hyperlipidemia    ??? Hypertension    ??? 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 Type 2 diabetes mellitus (CMS-HCC)        Surgical History:  Past Surgical History:   Procedure Laterality Date   ??? CHOLECYSTECTOMY     ??? EPIGASTRIC HERNIA REPAIR     ??? TONSILLECTOMY     ??? TOTAL ABDOMINAL HYSTERECTOMY  1990       Social History:  Social History     Socioeconomic History   ??? Marital status: Married     Spouse name: Not on file   ??? Number of children: Not on file   ??? Years of education: Not on file   ??? Highest education level: Not on file   Occupational History   ??? Not on file   Social Needs   ??? Financial resource strain: Not on file   ??? Food insecurity:     Worry: Not on file     Inability: Not on file   ??? Transportation needs:     Medical: Not on file     Non-medical: Not on file   Tobacco Use   ??? Smoking status: Never Smoker   ??? Smokeless tobacco: Never Used   Substance and Sexual Activity   ??? Alcohol use: Never     Frequency: Never   ??? Drug use: Never   ??? Sexual activity: Not on file   Lifestyle   ??? Physical activity:     Days per week: Not on file     Minutes per session: Not on file   ??? Stress: Not on file   Relationships ??? Social connections:     Talks on phone: Not on file     Gets together: Not on file     Attends religious service: Not on file     Active member of club or organization: Not on file     Attends meetings of clubs or organizations: Not on file     Relationship status: Not on file   Other Topics Concern   ??? Not on file   Social History Narrative   ??? Not on file       Family History:  Reviewed, no pertinent family history     Review of Systems:  10 systems reviewed and are negative unless otherwise mentioned in HPI    Labs/Studies:  Labs and Studies from the last 24hrs per EMR and Reviewed    Physical Exam:  General: Elderly, tired appearing, lying in bed in no acute distress  HEENT: Scleral icterus, dry mucous membranes  CV: RRR, no murmurs  Lungs: CTAB, no wheezing, no crackles  Skin: Large Echymoses over left thigh,left arm/hand.  Ext: Large Echymoses over left thigh,left arm/handm, left thigh tender to palpation    Abd: Soft, non-distended  Neuro: Tired, alert to name and place  Psych: Appropriate mood and affect

## 2018-05-29 LAB — CBC W/ AUTO DIFF
HEMATOCRIT: 24.1 % — ABNORMAL LOW (ref 36.0–46.0)
HEMOGLOBIN: 8 g/dL — ABNORMAL LOW (ref 12.0–16.0)
MEAN CORPUSCULAR HEMOGLOBIN CONC: 33 g/dL (ref 31.0–37.0)
MEAN CORPUSCULAR HEMOGLOBIN: 29.3 pg (ref 26.0–34.0)
MEAN CORPUSCULAR VOLUME: 89 fL (ref 80.0–100.0)
MEAN PLATELET VOLUME: 10.2 fL — ABNORMAL HIGH (ref 7.0–10.0)
PLATELET COUNT: 62 10*9/L — ABNORMAL LOW (ref 150–440)
RED BLOOD CELL COUNT: 2.71 10*12/L — ABNORMAL LOW (ref 4.00–5.20)
RED CELL DISTRIBUTION WIDTH: 19 % — ABNORMAL HIGH (ref 12.0–15.0)
WBC ADJUSTED: 9.2 10*9/L (ref 4.5–11.0)

## 2018-05-29 LAB — URINALYSIS
BACTERIA: NONE SEEN /HPF
BLOOD UA: NEGATIVE
GLUCOSE UA: 300 — AB
KETONES UA: NEGATIVE
LEUKOCYTE ESTERASE UA: NEGATIVE
NITRITE UA: NEGATIVE
PH UA: 6.5 (ref 5.0–9.0)
PROTEIN UA: 30 — AB
RBC UA: <1 /HPF (ref <=4)
SPECIFIC GRAVITY UA: 1.019 (ref 1.003–1.030)
SQUAMOUS EPITHELIAL: 3 /HPF (ref 0–5)
UROBILINOGEN UA: >=8 — AB

## 2018-05-29 LAB — BASIC METABOLIC PANEL
ANION GAP: 3 mmol/L — ABNORMAL LOW (ref 9–15)
BLOOD UREA NITROGEN: 18 mg/dL (ref 7–21)
CALCIUM: 7.7 mg/dL — ABNORMAL LOW (ref 8.5–10.2)
CHLORIDE: 90 mmol/L — ABNORMAL LOW (ref 98–107)
CREATININE: 0.33 mg/dL — ABNORMAL LOW (ref 0.60–1.00)
EGFR CKD-EPI NON-AA FEMALE: >90 mL/min/{1.73_m2} (ref >=60)
GLUCOSE RANDOM: 199 mg/dL — ABNORMAL HIGH (ref 65–179)
POTASSIUM: 4.1 mmol/L (ref 3.5–5.0)
SODIUM: 123 mmol/L — ABNORMAL LOW (ref 135–145)

## 2018-05-29 LAB — FIBRINOGEN LEVEL: Lab: 691 — ABNORMAL HIGH

## 2018-05-29 LAB — MANUAL DIFFERENTIAL
BASOPHILS - ABS (DIFF): 0 10*9/L (ref 0.0–0.1)
EOSINOPHILS - ABS (DIFF): 0.2 10*9/L (ref 0.0–0.4)
EOSINOPHILS - REL (DIFF): 2 %
LYMPHOCYTES - ABS (DIFF): 2.3 10*9/L (ref 1.5–5.0)
LYMPHOCYTES - REL (DIFF): 25 %
MONOCYTES - ABS (DIFF): 0.6 10*9/L (ref 0.2–0.8)
MONOCYTES - REL (DIFF): 7 %
NEUTROPHILS - REL (DIFF): 8 %

## 2018-05-29 LAB — VON WILLEBRAND FACTOR PANEL: VON WILLEBRAND FACTOR ACTIVITY: 326 %{normal} — ABNORMAL HIGH (ref 50–168)

## 2018-05-29 LAB — HEPATIC FUNCTION PANEL
ALKALINE PHOSPHATASE: 76 U/L (ref 38–126)
ALT (SGPT): 13 U/L — ABNORMAL LOW (ref 15–48)
AST (SGOT): 29 U/L (ref 14–38)
PROTEIN TOTAL: 5.6 g/dL — ABNORMAL LOW (ref 6.5–8.3)

## 2018-05-29 LAB — HEMATOCRIT: Lab: 19.3 — ABNORMAL LOW

## 2018-05-29 LAB — SODIUM URINE: Lab: 16

## 2018-05-29 LAB — PROTIME: Lab: 12.7

## 2018-05-29 LAB — KETONES UA: Lab: NEGATIVE

## 2018-05-29 LAB — FACTOR VIII ACTIVITY: Lab: 195 — ABNORMAL HIGH

## 2018-05-29 LAB — MEAN CORPUSCULAR VOLUME: Lab: 89

## 2018-05-29 LAB — PHOSPHORUS: Phosphate:MCnc:Pt:Ser/Plas:Qn:: 2.8 — ABNORMAL LOW

## 2018-05-29 LAB — VON WILLEBRAND FACTOR ACTIVITY: Lab: 326 — ABNORMAL HIGH

## 2018-05-29 LAB — HEPARIN CORRELATION: Lab: 0.2

## 2018-05-29 LAB — HEMOGLOBIN AND HEMATOCRIT, BLOOD: HEMOGLOBIN: 6.3 g/dL — ABNORMAL LOW (ref 12.0–16.0)

## 2018-05-29 LAB — D-DIMER QUANTITATIVE (CH,ML,PD,ET): Lab: 5026 — ABNORMAL HIGH

## 2018-05-29 LAB — SODIUM, URINE, RANDOM: SODIUM URINE: 16 mmol/L

## 2018-05-29 LAB — OSMOLALITY URINE: Lab: 225

## 2018-05-29 LAB — SODIUM
Sodium:SCnc:Pt:Ser/Plas:Qn:: 124 — ABNORMAL LOW
Sodium:SCnc:Pt:Ser/Plas:Qn:: 124 — ABNORMAL LOW

## 2018-05-29 LAB — MAGNESIUM: Magnesium:MCnc:Pt:Ser/Plas:Qn:: 1.5 — ABNORMAL LOW

## 2018-05-29 LAB — EOSINOPHILS - ABS (DIFF): Lab: 0.2

## 2018-05-29 LAB — BILIRUBIN DIRECT: Bilirubin.glucuronidated:MCnc:Pt:Ser/Plas:Qn:: 0.8 — ABNORMAL HIGH

## 2018-05-29 LAB — GLUCOSE RANDOM: Glucose:MCnc:Pt:Ser/Plas:Qn:: 199 — ABNORMAL HIGH

## 2018-05-29 MED ORDER — LISINOPRIL 10 MG PO TABS
10.00 | ORAL_TABLET | ORAL | Status: DC
Start: 2018-06-04 — End: 2018-05-29

## 2018-05-29 MED ORDER — MORPHINE SULFATE 2 MG/ML IJ SOLN
2.00 | INTRAMUSCULAR | Status: DC
Start: ? — End: 2018-05-29

## 2018-05-29 MED ORDER — INFLUENZA VAC SPLIT QUAD 0.5 ML IM SUSY
0.50 | PREFILLED_SYRINGE | INTRAMUSCULAR | Status: DC
Start: ? — End: 2018-05-29

## 2018-05-29 MED ORDER — LEVOFLOXACIN 500 MG PO TABS
500.00 | ORAL_TABLET | ORAL | Status: DC
Start: 2018-06-09 — End: 2018-05-29

## 2018-05-29 MED ORDER — POLYETHYLENE GLYCOL 3350 17 G PO PACK
17.00 | PACK | ORAL | Status: DC
Start: 2018-06-09 — End: 2018-05-29

## 2018-05-29 MED ORDER — SODIUM CHLORIDE FLUSH 0.9 % IV SOLN
10.00 | INTRAVENOUS | Status: DC
Start: 2018-06-08 — End: 2018-05-29

## 2018-05-29 MED ORDER — GENERIC EXTERNAL MEDICATION
1.00 | Status: DC
Start: 2018-06-08 — End: 2018-05-29

## 2018-05-29 MED ORDER — GENERIC EXTERNAL MEDICATION
2.00 | Status: DC
Start: 2018-06-08 — End: 2018-05-29

## 2018-05-29 MED ORDER — MELATONIN 3 MG PO TABS
6.00 | ORAL_TABLET | ORAL | Status: DC
Start: ? — End: 2018-05-29

## 2018-05-29 MED ORDER — SODIUM CHLORIDE 0.9 % IV SOLN
75.00 | INTRAVENOUS | Status: DC
Start: ? — End: 2018-05-29

## 2018-05-29 MED ORDER — LIDOCAINE 5 % EX PTCH
1.00 | MEDICATED_PATCH | CUTANEOUS | Status: DC
Start: 2018-06-08 — End: 2018-05-29

## 2018-05-29 MED ORDER — SODIUM CHLORIDE FLUSH 0.9 % IV SOLN
10.00 | INTRAVENOUS | Status: DC
Start: 2018-05-31 — End: 2018-05-29

## 2018-05-29 MED ORDER — DEXTROSE 10 % IV SOLN
25.00 | INTRAVENOUS | Status: DC
Start: ? — End: 2018-05-29

## 2018-05-29 MED ORDER — ACETAMINOPHEN 325 MG PO TABS
650.00 | ORAL_TABLET | ORAL | Status: DC
Start: ? — End: 2018-05-29

## 2018-05-29 MED ORDER — ALBUTEROL SULFATE (2.5 MG/3ML) 0.083% IN NEBU
2.50 | INHALATION_SOLUTION | RESPIRATORY_TRACT | Status: DC
Start: ? — End: 2018-05-29

## 2018-05-29 MED ORDER — VALACYCLOVIR HCL 500 MG PO TABS
500.00 | ORAL_TABLET | ORAL | Status: DC
Start: 2018-06-09 — End: 2018-05-29

## 2018-05-29 MED ORDER — FOLIC ACID 1 MG PO TABS
1.00 | ORAL_TABLET | ORAL | Status: DC
Start: 2018-06-09 — End: 2018-05-29

## 2018-05-29 MED ORDER — INSULIN LISPRO 100 UNIT/ML ~~LOC~~ SOLN
0.00 | SUBCUTANEOUS | Status: DC
Start: 2018-06-08 — End: 2018-05-29

## 2018-05-29 MED ORDER — PANTOPRAZOLE SODIUM 40 MG PO TBEC
40.00 | DELAYED_RELEASE_TABLET | ORAL | Status: DC
Start: 2018-06-08 — End: 2018-05-29

## 2018-05-29 MED ORDER — MAGNESIUM OXIDE 400 MG PO TABS
400.00 | ORAL_TABLET | ORAL | Status: DC
Start: 2018-05-29 — End: 2018-05-29

## 2018-05-29 MED ORDER — FLUTICASONE-SALMETEROL 100-50 MCG/DOSE IN AEPB
1.00 | INHALATION_SPRAY | RESPIRATORY_TRACT | Status: DC
Start: 2018-06-08 — End: 2018-05-29

## 2018-05-29 NOTE — Unmapped (Signed)
Pt alert and oriented x4 but forgetful with short term memory loss at times. Pt afebrile with some hypertension, MD paged and restarted on night time BP meds. VS otherwise stable. Transfused 1 unit pRBC per MD. Pt still with extensive swelling and bruising to L side of body. Pt with complaints of pain mostly in leg and back, given PRN tylenol x1. Pt also given PRN melatonin for sleep. Pt unable to sleep well overnight. Pt incontinent of urine and stool. Awaiting collection of urine sample. No new skin breakdown this shift. Continues Q2 turns. Mepilex to sacrum. Fall precautions and pt safety maintained. Will continue to monitor.     Problem: Adult Inpatient Plan of Care  Goal: Plan of Care Review  Outcome: Ongoing - Unchanged  Goal: Patient-Specific Goal (Individualization)  Outcome: Ongoing - Unchanged  Goal: Absence of Hospital-Acquired Illness or Injury  Outcome: Ongoing - Unchanged  Goal: Optimal Comfort and Wellbeing  Outcome: Ongoing - Unchanged  Goal: Readiness for Transition of Care  Outcome: Ongoing - Unchanged  Goal: Rounds/Family Conference  Outcome: Ongoing - Unchanged     Problem: Self-Care Deficit  Goal: Improved Ability to Complete Activities of Daily Living  Outcome: Ongoing - Unchanged     Problem: Fall Injury Risk  Goal: Absence of Fall and Fall-Related Injury  Outcome: Ongoing - Unchanged     Problem: Diabetes Comorbidity  Goal: Blood Glucose Level Within Desired Range  Outcome: Ongoing - Unchanged     Problem: Hypertension Comorbidity  Goal: Blood Pressure in Desired Range  Outcome: Ongoing - Unchanged     Problem: Skin Injury Risk Increased  Goal: Skin Health and Integrity  Outcome: Ongoing - Unchanged

## 2018-05-29 NOTE — Unmapped (Signed)
Daily Progress Note    Assessment/Plan:    Principal Problem:    Hematoma of left lower extremity  Active Problems:    Thrombocytopenia (CMS-HCC)    Type 2 diabetes mellitus, without long-term current use of insulin (CMS-HCC)    Essential hypertension    Hyperlipidemia    GERD (gastroesophageal reflux disease)    Acute myeloid leukemia (CMS-HCC)    Anemia  Resolved Problems:    * No resolved hospital problems. 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 She has since become transfusion dependent, being transferred to [facility] for further care.      Anemia, thrombocytopenia with bleeding into left gluteus maximus, left thigh, left arm: CT A/P from OSH left gluteal maximus hematoma. Vascular surgery at OSH evaluated LUE and determined there was no arterial insufficiency or venous clots. Lower PVLs negative for DVTs. No compartment syndrome per ortho, but they were worried for possible recent trauma. Patient does endorse a fall 2 months ago.  - Benign heme consulted, appreciate recs  - PICC Line placed  - LLE CT scan w/o contrast indicates decrease in size of gluteal hematoma  - Transfusion work-up reaction initiated   - UA ordered.  Can start tranexamic acid if no gross hematuria.    - Von Willebrand work-up unremarkable   - Maintain active type and screen, blood consent has been obtained   - Transfuse for Hgb < 7, will transfuse for PLT < 50 given hematomas and likely active bleeding  - PT/OT consult  ??  Indirect hyperbilirubinemia: Labs consistent with hemolysis. Intra and extrahepatic biliary ductal dilation on CT of uncertain etiology   - Coombs test is negative.  Consider cold immunoglobulins. - Abdominal ultrasound indicates moderate intrahepatic biliary dilation.  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  - Radiology recommends MRI of liver and/or MRCP  ??  Hyponatremia: Improving  - Begin NS @ 40cc/hr  - q12 Sodium checks  - follow up urine Na, Urine Osm    AML: Still with high degree of blasts (~80%). Followed by Dr. Lonni Fix. S/p 1 cycle azacytidine+venetoclax, next cycle on hold. Tumor lysis labs reassuring.   - Dr. Lonni Fix aware of admission  - CTM WBC and % blasts  ??  HTN: SBP 100s-110s on arrival.   - Hold home carvedilol and lisinopril given lower BP and bleeding  ??  T2DM:   - hold home metformin  - SSI  ??  GERD:   - continue Protonix 40 mg BID    Dispo:  Pending stabilization of transfusion needs  ___________________________________________________________________    Subjective:  Patient reports that her leg remains to be sore.      Labs/Studies:  Labs and Studies from the last 24hrs per EMR and Reviewed    Objective:  BP 133/60  - Pulse 101  - Temp 37.7 ??C (Oral)  - Resp 18  - Ht 165.1 cm (5' 5)  - Wt 52.7 kg (116 lb 1.6 oz)  - SpO2 93%  - BMI 19.32 kg/m??     General: A&O, NAD, lying in bed  Cv: RRR, no m/r/g  Resp: Non-labored, symmetrical  chest wall expansion  GI: Soft, non-tender, non-distended  MSK: Normal ROM, swelling of LUE and LLE  Derm: Significant bruising along LUE and LLE.     Psych: Cooperative, appropriate mood and affect  Neuro: No apparent focal neurological deficits

## 2018-05-29 NOTE — Unmapped (Signed)
PHYSICAL THERAPY  Evaluation (05/28/18 1524)     Patient Name:  Tracey Lang       Medical Record Number: [id number]   Date of Birth: 1941/01/22  Sex: Female            Treatment Diagnosis: deconditioning, hx of falls    ASSESSMENT    Tracey Lang is a 77yo F with PMHx of AML s/p 1 cycle azacytidine + venetoclax, T2DM, HTN, GERD, who presented to OSH on 9/16 w/ anemia  and massive swelling of LUE and LLE found to have left gluteal hematoma.  Pt presents to PT limited by pain and global weakness, requiring mod A for supine <> sit.  Unable to progress from sitting EOB to OOB assessment 2/2 pt deferral.  Medical team, PICC placement, RN and phlebotomist visited t/o session. Based on AMPAC 6 item raw score of 11, patient is 66.76% impaired with basic mobility. Anticipate she will benefit from 5x wkly low intensity rehab post d/c. Acute PT to f/u.       Today's Interventions: AMPAC 11/24, pt educated re: role of PT, POC, importance of progressive mobility to [facility] and chair t/o stay with assist from staff.  Pt completed APs x 10 bilaterally. Pt sat EOB with SBA ~15 minutes.    Activity Tolerance: Patient limited by pain    PLAN  Planned Frequency of Treatment:  1-2x per day for: 3-4x week      Planned Interventions: Balance activities;Diaphragmatic / Pursed-lip breathing;Education - Patient;Education - Family / caregiver;Endurance activities;Functional mobility;Home exercise program;Self-care / Home training;Therapeutic exercise;Therapeutic activity;Transfer training    Post-Discharge Physical Therapy Recommendations:  5x weekly;Low intensity        PT DME Recommendations: Defer to post acute     Goals:   Patient and Family Goals: none stated    Long Term Goal #1: AMPAC 24/24 within 6 weeks       SHORT GOAL #1: Pt will transfer supine <> sit with supervision              Time Frame : 2 weeks  SHORT GOAL #2: Pt will complete OOB mobility assessment              Time Frame : 1 week Prognosis:  Good  Barriers to Discharge: Pain;Functional strength deficits;Endurance deficits;Impaired balance;Severity of deficits  Positive Indicators: PLOF    SUBJECTIVE  Patient reports: I don't know why everyone keeps asking me, I did NOT have a fall  Current Functional Status: Pt received and left supine with HOB elevated, needs within reach and NAD.  Services patient receives: PT  Prior functional status: Pt reports after d/c from last admission (8/23),  she spent 3 weeks at SNF for rehab where she worked on ambulating with RW and LE strength.  Per Epic H&P 9/20 pt had possible recent fall.   Equipment available at home: [employer]    Past Medical History:   Diagnosis Date   ??? Asthma    ??? GERD (gastroesophageal reflux disease)    ??? Hyperlipidemia    ??? Hypertension    ??? 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 Type 2 diabetes mellitus (CMS-HCC)     Social History     Tobacco Use   ??? Smoking status: Never Smoker   ??? Smokeless tobacco: Never Used   Substance Use Topics   ??? Alcohol use: Never  Frequency: Never      Past Surgical History:   Procedure Laterality Date   ??? CHOLECYSTECTOMY     ??? EPIGASTRIC HERNIA REPAIR     ??? TONSILLECTOMY     ??? TOTAL ABDOMINAL HYSTERECTOMY  1990    No family history on file.     Allergies: Oxycodone and Hydrocodone                Objective Findings              Precautions: Falls;Neutropenic precautions;Bleeding precautions              Weight Bearing Status: Non- applicable              Required Braces or Orthoses: Non- applicable    Communication Preference: Verbal  Pain Comments: 8/10 pain reported in L hip/side/glute, RN present to admin pain medication during session     Equipment / Environment: Vascular access (PIV, TLC, Port-a-cath, PICC)          Orthostatics: Pt reports dizziness at EOB which resolved with LE exercises followed by rest  Airway Clearance: mobility    Living environment: House  Lives With: Spouse  Home Living: One level home;Walk-in shower;Standard height toilet  Rail placement (outside): Rail on right side     Number of Stairs: 10    Cognition: A&O     Skin Inspection: L arm dark purple/brown, L side and leg with diffuse bruising.  Severe edema t/o L leg.    UE ROM: WFL  UE Strength: WFL  LE ROM: LLE unable to achieve full knee ext 2/2 pain  LE Strength: 3/5 grossly, resistance not applied 2/2 pain and pt with low platelets                          Balance: static/dynamic sitting SBA, requires UE support         Bed Mobility: supine > sit mod A, pt used bedrails and elevated HOB, bed pad used to scoot pt to EOB with mod A.  Sit > supine mod A for BLE management, cues for technique and sequence required t/o.  Transfers: sit <> stand deferred 2/2 pt request to return to supine    Gait: not assessed 2/2 pt request   Stairs: not assessed      Endurance: decreased    Eval Duration(PT): 33 Min.    Medical Staff Made Aware: RN     I attest that I have reviewed the above information.  Signed: Hilaria Ota, PT  Filed 05/28/2018     I was physically present and immediately available to direct and supervise tasks that were related to patient management. The direction and supervision was continuous throughout the time these tasks were performed.     Hilaria Ota, PT

## 2018-05-29 NOTE — Unmapped (Signed)
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  Pt presents to OT limited by pain and global weakness. Attempted log rolling however unable to complete 2/2 LLE pain. Pt able to complete grooming task bed level with setup. Pt utilizing RW at home for functional mobility and requires help with IADLs from husband, reports history of falls. Based on the daily activity AM-PAC raw score of 11/24, the pt is considered to be 72.57% impaired with self care. This indicates post acute OT 5x/wk at discharge. After review of contributing co-mobidities and personal factors, clinical presentation and exam findings, patient demonstrates moderate complexity for evaluation and development of plan of care.          Activity Tolerance During Today's Session  Patient tolerated treatment well    Plan  Planned Frequency of Treatment:  1-2x per day for: 3-4x week       Planned Interventions:  Adaptive equipment;Balance activities;ADL retraining;Bed mobility;Compensatory tech. training;Conservation;Education - Patient;Education - Family / caregiver;Endurance activities;Functional mobility;Home exercise program;Neuromuscular re-education;Postular / Proximal stability;Positioning;Range of motion;Safety education;Therapeutic exercise;Transfer training;UE Strength / coordination exercise    Post-Discharge Occupational Therapy Recommendations:  OT Post Acute Discharge Recommendations: 5x weekly;Low intensity    ;    OT DME Recommendations: Defer to post acute GOALS:   Patient and Family Goals: Return to PLOF    Long Term Goal #1: Pt will score 20+/24 on [facility]       Short Term:  Pt will complete OOB functional transfer assessment    Time Frame : 2 weeks  Pt will complete LB dressing with setup and use of compensatory strategies    Time Frame : 2 weeks  Pt will complete grooming task independently    Time Frame : 2 weeks                  Prognosis:  Good  Positive Indicators:  PLOF  Barriers to Discharge: Age;Decreased caregiver support;Functional strength deficits;Endurance deficits;Inability to safely perform ADLS;Pain    Subjective  Current Status Pt received and left semi supine in bed, call bell in reach, safety measures, husband and son present  Prior Functional Status Reports independence with ADLs however has been requiring assistance from husband with IADLs as of 6 months ago. Utilizes cane and RW for functional mobility and has reported falls recently.        Services patient receives: OT;PT  Patient / Caregiver reports: My husband and son are coming today.    Past Medical History:   Diagnosis Date   ??? Asthma    ??? GERD (gastroesophageal reflux disease)    ??? Hyperlipidemia    ??? Hypertension    ??? 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 Type 2 diabetes mellitus (CMS-HCC)     Social History     Tobacco Use   ??? Smoking  status: Never Smoker   ??? Smokeless tobacco: Never Used   Substance Use Topics   ??? Alcohol use: Never     Frequency: Never      Past Surgical History:   Procedure Laterality Date   ??? CHOLECYSTECTOMY     ??? EPIGASTRIC HERNIA REPAIR     ??? TONSILLECTOMY     ??? TOTAL ABDOMINAL HYSTERECTOMY  1990    No family history on file.     Oxycodone and Hydrocodone     Objective Findings  Precautions / Restrictions  Non-applicable    Weight Bearing  Non-applicable    Required Braces or Orthoses  Non-applicable    Communication Preference  Verbal    Pain  5/10 pain on LLE, provided pt with rest and repositioning, RN aware     Equipment / Environment  Vascular access (PIV, TLC, Port-a-cath, PICC)    Living Situation   Living environment: House   Lives With: Spouse   Home Living: One level home;Walk-in shower;Standard height toilet   Equipment available at home: [employer] placement (outside): Rail on right side        Cognition   Orientation Level:  Oriented x 4   Arousal/Alertness:  Appropriate responses to stimuli   Attention Span:  Appears intact   Memory:  Appears intact   Following Commands:  Follows all commands and directions without difficulty   Safety Judgment:  Good awareness of safety precautions   Awareness of Errors:  Good awareness of safety precautions   Problem Solving:  Able to problem solve independently   Comments:      Vision / Perception  Vision: Wears glasses for reading only;No visual deficits          Hand Function  Hand Dominance: L  B grip WFL    Skin Inspection  bruising all over LUE, swelling and brusing on LLE    ROM / Strength/Coordination  UE ROM/ Strength/ Coordination: BUE ROM WFL, impaired functional strength   LE ROM/ Strength/ Coordination: complete figure 4 position on RLE, impaired LLE 2/2 pain and swelling    Sensation:  no N/T at this time    Balance:  defer sitting or standing at this time 2/2 LLE pain    Mobility/Gait/Transfers: defer sitting or standing at this time 2/2 LLE pain. Attempted rolling however pt reporting pain to LLE     ADL:     Grooming: able to brush teeth bed level with setup  Dressing: UB: anticipate setup, LB: required tot A to don socks  Eating: setup   Toileting: anticipate max A for rolling L) and R) for bedpan placement        Vitals/ Orthostatics:  At Rest: NAD  With Activity: NAD       Interventions Performed During Today's Session: [facility] 11/24. Pt education re: importance of elevation of LLE/LUE (elevated on pillows at end of session), bed mobility techniques, pain management, role of acute OT/POC. Grooming bed level, education on POC, role of OT, and importance of exercise.         Eval Duration (OT): 18 Min.    Medical Staff Made Aware: RN aware    I attest that I have reviewed the above information.  Signed: Jaclyn Shaggy, OT  Filed 05/29/2018     I was physically present and immediately available to direct and supervise tasks that were related to patient management. The direction and supervision was continuous throughout the time these tasks were performed.  Jaclyn Shaggy, OT

## 2018-05-29 NOTE — Unmapped (Signed)
A&O x 4. VSS. Pt's hgb. Increased to 9.0 after 2 units of PRBC's. Platelets infused today and platelets increased to 76. Pt continues to have bruising to LUE, abd., and LLE. C/o pain to left lower back and LLE. Naproxen and lidocaine patch started today for continued pain that is not relieved by prn tylenol. PICC line placed today. CT of LLE and [place] of abd. Scheduled for this evening after pt had been NPO for 6 hours. No falls or injuries. Purwik in place. Adequate urine output today.       Problem: Adult Inpatient Plan of Care  Goal: Plan of Care Review  Outcome: Progressing  Goal: Patient-Specific Goal (Individualization)  Outcome: Progressing  Goal: Absence of Hospital-Acquired Illness or Injury  Outcome: Progressing  Goal: Optimal Comfort and Wellbeing  Outcome: Progressing  Goal: Readiness for Transition of Care  Outcome: Progressing  Goal: Rounds/Family Conference  Outcome: Progressing     Problem: Self-Care Deficit  Goal: Improved Ability to Complete Activities of Daily Living  Outcome: Progressing     Problem: Fall Injury Risk  Goal: Absence of Fall and Fall-Related Injury  Outcome: Progressing     Problem: Diabetes Comorbidity  Goal: Blood Glucose Level Within Desired Range  Outcome: Progressing     Problem: Hypertension Comorbidity  Goal: Blood Pressure in Desired Range  Outcome: Progressing

## 2018-05-29 NOTE — Unmapped (Signed)
A&O x 4 but forgetful at times. VSS. Continues to have bruising, swelling, and pain to LUE, abdomen and left side of back. Pt reports pain in the most tolerable when she has lidocaine patch in place. Pt incontinent of urine and purwik in place. Pt up with OT today. Fall precautions maintained.     Problem: Adult Inpatient Plan of Care  Goal: Plan of Care Review  Outcome: Progressing  Goal: Patient-Specific Goal (Individualization)  Outcome: Progressing  Goal: Absence of Hospital-Acquired Illness or Injury  Outcome: Progressing  Goal: Optimal Comfort and Wellbeing  Outcome: Progressing  Goal: Readiness for Transition of Care  Outcome: Progressing  Goal: Rounds/Family Conference  Outcome: Progressing     Problem: Self-Care Deficit  Goal: Improved Ability to Complete Activities of Daily Living  Outcome: Progressing     Problem: Fall Injury Risk  Goal: Absence of Fall and Fall-Related Injury  Outcome: Progressing     Problem: Diabetes Comorbidity  Goal: Blood Glucose Level Within Desired Range  Outcome: Progressing     Problem: Hypertension Comorbidity  Goal: Blood Pressure in Desired Range  Outcome: Progressing     Problem: Skin Injury Risk Increased  Goal: Skin Health and Integrity  Outcome: Progressing

## 2018-05-30 LAB — MANUAL DIFFERENTIAL
BASOPHILS - ABS (DIFF): 0 10*9/L (ref 0.0–0.1)
BASOPHILS - REL (DIFF): 0 %
BLASTS - REL (DIFF): 69 % (ref <=0)
EOSINOPHILS - ABS (DIFF): 0.2 10*9/L (ref 0.0–0.4)
EOSINOPHILS - REL (DIFF): 2 %
LYMPHOCYTES - ABS (DIFF): 1.6 10*9/L (ref 1.5–5.0)
MONOCYTES - ABS (DIFF): 0.6 10*9/L (ref 0.2–0.8)
MONOCYTES - REL (DIFF): 5 %
NEUTROPHILS - ABS (DIFF): 1.2 10*9/L — ABNORMAL LOW (ref 2.0–7.5)

## 2018-05-30 LAB — MEAN CORPUSCULAR VOLUME: Lab: 89.4

## 2018-05-30 LAB — HEPATIC FUNCTION PANEL
ALBUMIN: 2.2 g/dL — ABNORMAL LOW (ref 3.5–5.0)
ALT (SGPT): 14 U/L — ABNORMAL LOW (ref 15–48)
AST (SGOT): 28 U/L (ref 14–38)
BILIRUBIN TOTAL: 2.9 mg/dL — ABNORMAL HIGH (ref 0.0–1.2)
PROTEIN TOTAL: 5.4 g/dL — ABNORMAL LOW (ref 6.5–8.3)

## 2018-05-30 LAB — BASIC METABOLIC PANEL
ANION GAP: 6 mmol/L — ABNORMAL LOW (ref 9–15)
BLOOD UREA NITROGEN: 15 mg/dL (ref 7–21)
BUN / CREAT RATIO: 45
CALCIUM: 7.5 mg/dL — ABNORMAL LOW (ref 8.5–10.2)
CHLORIDE: 93 mmol/L — ABNORMAL LOW (ref 98–107)
CO2: 27 mmol/L (ref 22.0–30.0)
EGFR CKD-EPI AA FEMALE: >90 mL/min/{1.73_m2} (ref >=60)
EGFR CKD-EPI NON-AA FEMALE: >90 mL/min/{1.73_m2} (ref >=60)
POTASSIUM: 4.3 mmol/L (ref 3.5–5.0)
SODIUM: 126 mmol/L — ABNORMAL LOW (ref 135–145)

## 2018-05-30 LAB — CBC W/ AUTO DIFF
HEMATOCRIT: 22.7 % — ABNORMAL LOW (ref 36.0–46.0)
HEMOGLOBIN: 7.3 g/dL — ABNORMAL LOW (ref 12.0–16.0)
MEAN CORPUSCULAR HEMOGLOBIN CONC: 32.3 g/dL (ref 31.0–37.0)
MEAN CORPUSCULAR HEMOGLOBIN: 28.9 pg (ref 26.0–34.0)
MEAN CORPUSCULAR VOLUME: 89.4 fL (ref 80.0–100.0)
MEAN PLATELET VOLUME: 9.9 fL (ref 7.0–10.0)
NUCLEATED RED BLOOD CELLS: 70 /100{WBCs} — ABNORMAL HIGH (ref <=4)
PLATELET COUNT: 50 10*9/L — ABNORMAL LOW (ref 150–440)
RED BLOOD CELL COUNT: 2.54 10*12/L — ABNORMAL LOW (ref 4.00–5.20)
RED CELL DISTRIBUTION WIDTH: 19.1 % — ABNORMAL HIGH (ref 12.0–15.0)
WBC ADJUSTED: 11.5 10*9/L — ABNORMAL HIGH (ref 4.5–11.0)

## 2018-05-30 LAB — HEMOGLOBIN AND HEMATOCRIT, BLOOD: HEMOGLOBIN: 6.7 g/dL — ABNORMAL LOW (ref 12.0–16.0)

## 2018-05-30 LAB — PLATELET COUNT: Lab: 43 — ABNORMAL LOW

## 2018-05-30 LAB — SODIUM: Sodium:SCnc:Pt:Ser/Plas:Qn:: 122 — ABNORMAL LOW

## 2018-05-30 LAB — HEMATOCRIT: Lab: 21 — ABNORMAL LOW

## 2018-05-30 LAB — MAGNESIUM: Magnesium:MCnc:Pt:Ser/Plas:Qn:: 1.4 — ABNORMAL LOW

## 2018-05-30 LAB — PHOSPHORUS: Phosphate:MCnc:Pt:Ser/Plas:Qn:: 2.6 — ABNORMAL LOW

## 2018-05-30 LAB — ALKALINE PHOSPHATASE: Alkaline phosphatase:CCnc:Pt:Ser/Plas:Qn:: 81

## 2018-05-30 LAB — CHLORIDE: Chloride:SCnc:Pt:Ser/Plas:Qn:: 93 — ABNORMAL LOW

## 2018-05-30 LAB — BURR CELLS

## 2018-05-30 MED ORDER — TRANEXAMIC ACID 650 MG PO TABS
1300.00 | ORAL_TABLET | ORAL | Status: DC
Start: 2018-06-08 — End: 2018-05-30
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
## 2018-05-30 NOTE — Unmapped (Signed)
Daily Progress Note    Assessment/Plan:    Principal Problem:    Hematoma of left lower extremity  Active Problems:    Thrombocytopenia (CMS-HCC)    Type 2 diabetes mellitus, without long-term current use of insulin (CMS-HCC)    Essential hypertension    Hyperlipidemia    GERD (gastroesophageal reflux disease)    Acute myeloid leukemia (CMS-HCC)    Anemia  Resolved Problems:    * No resolved hospital problems. 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 She has since become transfusion dependent, being transferred to [facility] for further care.      Anemia, thrombocytopenia with bleeding into left gluteus maximus, left thigh, left arm: CT A/P from OSH left gluteal maximus hematoma. Vascular surgery at OSH evaluated LUE and determined there was no arterial insufficiency or venous clots. Lower PVLs negative for DVTs. No compartment syndrome per ortho, but they were worried for possible recent trauma. Patient does endorse a fall 2 months ago.  - Benign heme consulted, appreciate recs  - PICC Line placed  - LLE CT scan w/o contrast indicates decrease in size of gluteal hematoma  - Transfusion work-up reaction initiated   - Start tranexamic acid 1300 mg BID  - Von Willebrand work-up unremarkable   - Maintain active type and screen, blood consent has been obtained   - Transfuse for Hgb < 7, will transfuse for PLT < 50 given hematomas and likely active bleeding  - PT/OT consult  ??  Indirect hyperbilirubinemia: Labs consistent with hemolysis. Intra and extrahepatic biliary ductal dilation on CT of uncertain etiology   - Coombs test is negative.  Consider cold immunoglobulins.  - Abdominal ultrasound indicates moderate intrahepatic biliary dilation.  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  - Radiology recommends MRI of liver and/or MRCP  ??  Hyponatremia: Improving with normal saline. Urine Na 16, urine osm 225, not consistent with SIADH. Etiology is likely hypovolemic.   - Continue NS @ 40cc/hr  - q12 Sodium checks    AML: Still with high degree of blasts (~80%). Followed by Dr. Lonni Fix. S/p 1 cycle azacytidine+venetoclax, next cycle on hold. Tumor lysis labs reassuring.   - Dr. Lonni Fix aware of admission  - CTM WBC and % blasts  ??  HTN: SBP 100s-110s on arrival.   - Hold home carvedilol and lisinopril given lower BP and bleeding  ??  T2DM:   - hold home metformin  - SSI  ??  GERD:   - continue Protonix 40 mg BID    Dispo:  Pending stabilization of transfusion needs  ___________________________________________________________________    Subjective:  Patient reports that her leg remains to be sore.      Labs/Studies:  Labs and Studies from the last 24hrs per EMR and Reviewed    Objective:  BP 146/67  - Pulse 103  - Temp 36.5 ??C (Oral)  - Resp 18  - Ht 165.1 cm (5' 5)  - Wt 52.7 kg (116 lb 1.6 oz)  - SpO2 95%  - BMI 19.32 kg/m??     General: A&O, NAD, lying in bed  Cv: RRR, no m/r/g  Resp: Non-labored, symmetrical chest wall expansion  GI: Soft, non-tender, non-distended  MSK: Normal ROM, swelling of LUE and LLE  Derm: Significant bruising along LUE and LLE.     Psych: Cooperative, appropriate mood and affect  Neuro: No apparent focal neurological deficits

## 2018-05-31 ENCOUNTER — Inpatient Hospital Stay: Payer: Medicare Other | Admitting: Hematology and Oncology

## 2018-05-31 ENCOUNTER — Inpatient Hospital Stay: Payer: Medicare Other

## 2018-05-31 LAB — BASIC METABOLIC PANEL
ANION GAP: 4 mmol/L — ABNORMAL LOW (ref 9–15)
BLOOD UREA NITROGEN: 13 mg/dL (ref 7–21)
BUN / CREAT RATIO: 43
CALCIUM: 7.7 mg/dL — ABNORMAL LOW (ref 8.5–10.2)
CO2: 29 mmol/L (ref 22.0–30.0)
CREATININE: 0.3 mg/dL — ABNORMAL LOW (ref 0.60–1.00)
EGFR CKD-EPI AA FEMALE: >90 mL/min/{1.73_m2} (ref >=60)
EGFR CKD-EPI NON-AA FEMALE: >90 mL/min/{1.73_m2} (ref >=60)
POTASSIUM: 4 mmol/L (ref 3.5–5.0)
SODIUM: 128 mmol/L — ABNORMAL LOW (ref 135–145)

## 2018-05-31 LAB — MANUAL DIFFERENTIAL
BLASTS - REL (DIFF): 67 % (ref <=0)
EOSINOPHILS - REL (DIFF): 2 %
LYMPHOCYTES - ABS (DIFF): 2.3 10*9/L (ref 1.5–5.0)
LYMPHOCYTES - REL (DIFF): 22 %
MONOCYTES - ABS (DIFF): 0.2 10*9/L (ref 0.2–0.8)
MONOCYTES - REL (DIFF): 2 %
NEUTROPHILS - ABS (DIFF): 0.7 10*9/L — ABNORMAL LOW (ref 2.0–7.5)
NEUTROPHILS - REL (DIFF): 7 %

## 2018-05-31 LAB — CBC W/ AUTO DIFF
HEMATOCRIT: 24.9 % — ABNORMAL LOW (ref 36.0–46.0)
HEMOGLOBIN: 8.1 g/dL — ABNORMAL LOW (ref 12.0–16.0)
MEAN CORPUSCULAR HEMOGLOBIN CONC: 32.6 g/dL (ref 31.0–37.0)
MEAN CORPUSCULAR HEMOGLOBIN: 29 pg (ref 26.0–34.0)
MEAN CORPUSCULAR VOLUME: 88.8 fL (ref 80.0–100.0)
MEAN PLATELET VOLUME: 9.9 fL (ref 7.0–10.0)
NUCLEATED RED BLOOD CELLS: 97 /100{WBCs} — ABNORMAL HIGH (ref <=4)
PLATELET COUNT: 68 10*9/L — ABNORMAL LOW (ref 150–440)
WBC ADJUSTED: 10.6 10*9/L (ref 4.5–11.0)

## 2018-05-31 LAB — MAGNESIUM
MAGNESIUM: 1.5 mg/dL — ABNORMAL LOW (ref 1.6–2.2)
Magnesium:MCnc:Pt:Ser/Plas:Qn:: 1.5 — ABNORMAL LOW

## 2018-05-31 LAB — PHOSPHORUS: Phosphate:MCnc:Pt:Ser/Plas:Qn:: 3.1

## 2018-05-31 LAB — HEPATIC FUNCTION PANEL
ALBUMIN: 2.3 g/dL — ABNORMAL LOW (ref 3.5–5.0)
ALT (SGPT): 26 U/L (ref 15–48)
AST (SGOT): 33 U/L (ref 14–38)
BILIRUBIN DIRECT: 0.8 mg/dL — ABNORMAL HIGH (ref 0.00–0.40)

## 2018-05-31 LAB — POLYCHROMASIA

## 2018-05-31 LAB — SODIUM
Sodium:SCnc:Pt:Ser/Plas:Qn:: 126 — ABNORMAL LOW
Sodium:SCnc:Pt:Ser/Plas:Qn:: 129 — ABNORMAL LOW
Sodium:SCnc:Pt:Ser/Plas:Qn:: 129 — ABNORMAL LOW

## 2018-05-31 LAB — MEAN CORPUSCULAR VOLUME: Lab: 88.8

## 2018-05-31 LAB — POTASSIUM: Potassium:SCnc:Pt:Ser/Plas:Qn:: 4

## 2018-05-31 LAB — ALKALINE PHOSPHATASE: Alkaline phosphatase:CCnc:Pt:Ser/Plas:Qn:: 93

## 2018-05-31 LAB — PLATELET COUNT: Lab: 72 — ABNORMAL LOW
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
## 2018-05-31 NOTE — Unmapped (Signed)
Pt alert and oriented to self, time and place.  VSS, afebrile, no falls noted.  No pain expressed during shift.  Pt IV fluids increased from 40 to 75 due to hyponatremia.  One unit of blood started with one more to tranfuse plus platelets after 5pm lab draw.  No acute events during shift.  WCTM.

## 2018-05-31 NOTE — Unmapped (Signed)
Daily Progress Note    Assessment/Plan:    Principal Problem:    Hematoma of left lower extremity  Active Problems:    Thrombocytopenia (CMS-HCC)    Type 2 diabetes mellitus, without long-term current use of insulin (CMS-HCC)    Essential hypertension    Hyperlipidemia    GERD (gastroesophageal reflux disease)    Acute myeloid leukemia (CMS-HCC)    Anemia  Resolved Problems:    * No resolved hospital problems. 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 She has since become transfusion dependent, being transferred to [facility] for further care.      Anemia, thrombocytopenia with bleeding into left gluteus maximus, left thigh, left arm: LLE CT scan indicates decrease in size of gluteal hematoma.  Transfusion dependency most likely due to hematoma in setting of thrombocytopenia   - Continue tranexamic acid  - Benign heme consulted, appreciate recs  - PICC Line placed  - LLE CT scan w/o contrast indicates decrease in size of gluteal hematoma  - Transfusion work-up reaction initiated   - Von Willebrand work-up unremarkable   - Maintain active type and screen, blood consent has been obtained   - Transfuse for Hgb < 7, will transfuse for PLT < 50 given hematomas and likely active bleeding  - PT/OT consult  ??  Indirect hyperbilirubinemia: Labs consistent with hemolysis. Intra and extrahepatic biliary ductal dilation on CT of uncertain etiology   - Coombs test is negative.  Consider cold immunoglobulins.  - Abdominal ultrasound indicates moderate intrahepatic biliary dilation.  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    - Radiology recommends MRI of liver and/or MRCP  ??  Hyponatremia: Improving  - Continue NS @ 75cc/hr.  Decrease fluids if sodium continues to rise.  - q12 Sodium checks    AML: Still with high degree of blasts (~80%). Followed by Dr. Lonni Fix. S/p 1 cycle azacytidine+venetoclax, next cycle on hold. Tumor lysis labs reassuring.   - Dr. Lonni Fix aware of admission  - CTM WBC and % blasts  ??  HTN: Resume lisinopril and coreg   - Begin home amlodipine and/or coreg if patient remains HTN and bleeding risk is low  ??  T2DM:   - hold home metformin  - SSI  ??  GERD:   - Continue Protonix 40 mg BID    Dispo:  Pending stabilization of transfusion needs  ___________________________________________________________________    Subjective:  Patient reports that her leg continues to be sore.  She is intermittently confused.      Labs/Studies:  Labs and Studies from the last 24hrs per EMR and Reviewed    Objective:  BP (P) 149/75  - Pulse (P) 99  - Temp (P) 36 ??C (Oral)  - Resp (P) 20  - Ht 165.1 cm (5' 5)  - Wt 66.9 kg (147 lb 7.8 oz)  - SpO2 (P) 100%  - BMI 24.54 kg/m??     General: A&O, NAD, responds appropriately to questions   Cv: RRR, no m/r/g  Resp: Non-labored, symmetrical chest wall expansion  GI: Soft, non-tender, non-distended  MSK: Normal ROM, swelling of LUE and LLE  Derm: Significant bruising along LUE and LLE.     Psych: Cooperative, appropriate mood and affect  Neuro: No apparent focal neurological deficits

## 2018-05-31 NOTE — Unmapped (Addendum)
Pt alert and oriented x3; disoriented to situation intermittently. Pt hypertensive; service aware. All other VSS. Pt with c/o left hip/leg soreness; PRN Tylenol administered x1. One unit of blood and one unit of platelets transfused per order. Platelet recheck of 72. Sodium value of 126; service notified and no new orders were written. Q2 turns. No falls. Bed alarm on. Will continue to monitor.     Problem: Adult Inpatient Plan of Care  Goal: Plan of Care Review  Outcome: Progressing  Goal: Patient-Specific Goal (Individualization)  Outcome: Progressing  Goal: Absence of Hospital-Acquired Illness or Injury  Outcome: Progressing  Goal: Optimal Comfort and Wellbeing  Outcome: Progressing  Goal: Readiness for Transition of Care  Outcome: Progressing  Goal: Rounds/Family Conference  Outcome: Progressing     Problem: Self-Care Deficit  Goal: Improved Ability to Complete Activities of Daily Living  Outcome: Progressing     Problem: Fall Injury Risk  Goal: Absence of Fall and Fall-Related Injury  Outcome: Progressing     Problem: Diabetes Comorbidity  Goal: Blood Glucose Level Within Desired Range  Outcome: Progressing     Problem: Hypertension Comorbidity  Goal: Blood Pressure in Desired Range  Outcome: Progressing     Problem: Skin Injury Risk Increased  Goal: Skin Health and Integrity  Outcome: Progressing

## 2018-05-31 NOTE — Unmapped (Addendum)
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 She has since become transfusion dependent, being transferred to [facility] for further care.   ??  Patient presented to OSH with anemia and massive swelling of left upper and left lower extremity. She was found to have gluteal hematoma and likely active bleeding into left arm and left thigh, suspect due to recent trauma. She has been transfusion dependent at OSH, requiring 5 u pRBCs from 9/16-9/18. Also required 1 unit platelet transfusion to keep platelets >50.   ??  On arrival patient is very tired, but hemodynamically stable. She has some pain in her left hip. She states that she had a fall some time ago but cannot elaborate on the details. She denies shortness of breath, chest pain, diarrhea. She states her pain has been well controlled on oxy and prn IV morphine; her pain has since resolved.  On arrival platelets 30, Hgb 6. Na 121. Hemodynamically stable.    Hospital Course:  On admission, patient received 2 units of pRBC.  Von Willebrand work-up was negative.  Transfusion reaction work-up was negative.  Lower extremity CT scan indicated a decrease in the size of the gluteal hematoma.  Benign heme was consulted and recommended starting folic acid and tranexamic acid.  PICC line was placed.  DIC labs on admission were unremarkable.  Patient was given transfusions as needed.  She received 2 units on 9/19, 1 unit on 9/20, 1 unit on 9/21, and 2 units on 9/22, 1 unit on 9/25 plus 1 unit of platelets each on 9/22 and 9/25.  Platelets were maintained greater than 50 initially and this goal was gradually reduced to 30 over the course of admission.  Transfusion dependency was thought to be due to hematomas.  Patient was monitored until transfusion dependency had decreased, although she will need regular monitoring 2-3 times weekly for platelets/pRBCs.     Of note, patient was found to have indirect hyperbilirubinemia on admission.  Abdominal ultrasound indicated moderate intrahepatic biliary dilation.  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  Radiology recommended further work-up with liver MRI and/or MRCP; this was deferred to outpatient as LFTs are trending down.    Patient was found to be hyponatremic on admission.  She was started on gentle fluid resuscitation with normal saline.  Her sodium was monitored and improved over the course of admission. By discharge, patient was no longer receiving fluids and was regulating her Na with counseling on fluid intake.    While inpatient, patient was found to have high circulating blasts (~80%). Per Dr. Lonni Fix, further chemotherapy is unlikely to result in remission. This was discussed with patient and husband separately. They will need to have further discussions about GOC, but for now no changes were made to her current plan of care.

## 2018-06-01 ENCOUNTER — Other Ambulatory Visit: Payer: Self-pay | Admitting: Family Medicine

## 2018-06-01 ENCOUNTER — Inpatient Hospital Stay: Payer: Medicare Other

## 2018-06-01 DIAGNOSIS — E119 Type 2 diabetes mellitus without complications: Secondary | ICD-10-CM

## 2018-06-01 LAB — MANUAL DIFFERENTIAL
BASOPHILS - REL (DIFF): 0 %
BLASTS - REL (DIFF): 75 % (ref <=0)
EOSINOPHILS - ABS (DIFF): 0.1 10*9/L (ref 0.0–0.4)
EOSINOPHILS - REL (DIFF): 1 %
LYMPHOCYTES - ABS (DIFF): 2.8 10*9/L (ref 1.5–5.0)
MONOCYTES - ABS (DIFF): 0 10*9/L — ABNORMAL LOW (ref 0.2–0.8)
MONOCYTES - REL (DIFF): 0 %
NEUTROPHILS - ABS (DIFF): 0.3 10*9/L — CL (ref 2.0–7.5)
NEUTROPHILS - REL (DIFF): 2 %

## 2018-06-01 LAB — PHOSPHORUS: Phosphate:MCnc:Pt:Ser/Plas:Qn:: 3

## 2018-06-01 LAB — CBC W/ AUTO DIFF
HEMATOCRIT: 23 % — ABNORMAL LOW (ref 36.0–46.0)
HEMOGLOBIN: 7.6 g/dL — ABNORMAL LOW (ref 12.0–16.0)
MEAN CORPUSCULAR HEMOGLOBIN CONC: 32.9 g/dL (ref 31.0–37.0)
MEAN CORPUSCULAR HEMOGLOBIN: 29.3 pg (ref 26.0–34.0)
MEAN PLATELET VOLUME: 9.3 fL (ref 7.0–10.0)
NUCLEATED RED BLOOD CELLS: 45 /100{WBCs} — ABNORMAL HIGH (ref <=4)
RED BLOOD CELL COUNT: 2.58 10*12/L — ABNORMAL LOW (ref 4.00–5.20)
RED CELL DISTRIBUTION WIDTH: 19.9 % — ABNORMAL HIGH (ref 12.0–15.0)
WBC ADJUSTED: 12.6 10*9/L — ABNORMAL HIGH (ref 4.5–11.0)

## 2018-06-01 LAB — HEPATIC FUNCTION PANEL
ALBUMIN: 2.2 g/dL — ABNORMAL LOW (ref 3.5–5.0)
ALKALINE PHOSPHATASE: 89 U/L (ref 38–126)
AST (SGOT): 30 U/L (ref 14–38)
BILIRUBIN DIRECT: 0.7 mg/dL — ABNORMAL HIGH (ref 0.00–0.40)
PROTEIN TOTAL: 5.3 g/dL — ABNORMAL LOW (ref 6.5–8.3)

## 2018-06-01 LAB — BASIC METABOLIC PANEL
ANION GAP: 6 mmol/L — ABNORMAL LOW (ref 9–15)
BLOOD UREA NITROGEN: 17 mg/dL (ref 7–21)
BUN / CREAT RATIO: 39
CALCIUM: 7.4 mg/dL — ABNORMAL LOW (ref 8.5–10.2)
CHLORIDE: 96 mmol/L — ABNORMAL LOW (ref 98–107)
CO2: 26 mmol/L (ref 22.0–30.0)
CREATININE: 0.44 mg/dL — ABNORMAL LOW (ref 0.60–1.00)
EGFR CKD-EPI AA FEMALE: >90 mL/min/{1.73_m2} (ref >=60)
GLUCOSE RANDOM: 274 mg/dL — ABNORMAL HIGH (ref 65–179)
POTASSIUM: 3.8 mmol/L (ref 3.5–5.0)
SODIUM: 128 mmol/L — ABNORMAL LOW (ref 135–145)

## 2018-06-01 LAB — ANISOCYTOSIS

## 2018-06-01 LAB — EOSINOPHILS - REL (DIFF): Lab: 1

## 2018-06-01 LAB — MAGNESIUM: Magnesium:MCnc:Pt:Ser/Plas:Qn:: 1.5 — ABNORMAL LOW

## 2018-06-01 LAB — BUN / CREAT RATIO: Urea nitrogen/Creatinine:MRto:Pt:Ser/Plas:Qn:: 39

## 2018-06-01 LAB — SODIUM
Sodium:SCnc:Pt:Ser/Plas:Qn:: 126 — ABNORMAL LOW
Sodium:SCnc:Pt:Ser/Plas:Qn:: 127 — ABNORMAL LOW

## 2018-06-01 LAB — AST (SGOT): Aspartate aminotransferase:CCnc:Pt:Ser/Plas:Qn:: 30

## 2018-06-01 MED ORDER — TRANEXAMIC ACID 650 MG TABLET
ORAL_TABLET | Freq: Two times a day (BID) | ORAL | 0 refills | 0.00000 days
Start: 2018-06-01 — End: 2018-06-01

## 2018-06-01 NOTE — Unmapped (Signed)
Daily Progress Note    Assessment/Plan:    Principal Problem:    Hematoma of left lower extremity  Active Problems:    Thrombocytopenia (CMS-HCC)    Type 2 diabetes mellitus, without long-term current use of insulin (CMS-HCC)    Essential hypertension    Hyperlipidemia    GERD (gastroesophageal reflux disease)    Acute myeloid leukemia (CMS-HCC)    Anemia  Resolved Problems:    * No resolved hospital problems. 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 She has since become transfusion dependent, being transferred to [facility] for further care.      Anemia, thrombocytopenia with bleeding into left gluteus maximus, left thigh, left arm: LLE CT scan indicates decrease in size of gluteal hematoma.  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 No transfusions required in 36 hours.  - Continue tranexamic acid  - Benign heme consulted, appreciate recs  - PICC Line placed  - LLE CT scan w/o contrast indicates decrease in size of gluteal hematoma  - Transfusion work-up reaction initiated   - Von Willebrand work-up unremarkable   - Maintain active type and screen, blood consent has been obtained   - Transfuse for Hgb < 7, will transfuse for PLT < 50 given hematomas and likely active bleeding  - PT/OT consult  ??  Indirect hyperbilirubinemia: Labs consistent with hemolysis. Intra and extrahepatic biliary ductal dilation on CT of uncertain etiology   - Coombs test is negative.  Consider cold immunoglobulins.  - Abdominal ultrasound indicates moderate intrahepatic biliary dilation.  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    - Radiology recommends MRI of liver and/or MRCP; deferred as hyperbilirubinemia is improving  ??  Hyponatremia: Stable.  - D/Cd fluids to assess for stability for discharge  - q8 Sodium checks  - Strict I/Os    AML: Still with high degree of blasts (~80%). Followed by Dr. Lonni Fix. S/p 1 cycle azacytidine+venetoclax, next cycle on hold. Tumor lysis labs reassuring.   - Dr. Lonni Fix aware of admission  - CTM WBC and % blasts  ??  HTN: Resumed lisinopril and coreg   - Restarted home amlodipine 5 mg daily  ??  T2DM:   - hold home metformin  - SSI  ??  GERD:   - Continue Protonix 40 mg BID    Dispo:  Pending stabilization of transfusion needs. Possible discharge to SNF if continues to be stable.  ___________________________________________________________________    Subjective:  Patient reports that her leg continues to be sore. Otherwise has trouble getting herself up to get food. No acute events overnight.    Labs/Studies:  Labs and Studies from the last 24hrs per EMR and Reviewed    Objective:  BP 185/81  - Pulse 95  - Temp 37.1 ??C (Oral)  - Resp 18  - Ht 165.1 cm (5' 5)  - Wt 66.9 kg (147 lb 7.8 oz)  - SpO2 99%  - BMI 24.54 kg/m??     General: A&O, NAD, responds appropriately to questions   Cv: RRR, no m/r/g  Resp: Non-labored, symmetrical chest wall expansion  GI: Soft,  non-tender, non-distended  MSK: Normal ROM, swelling of LUE and LLE  Derm: Significant bruising along LUE and LLE.     Psych: Cooperative, appropriate mood and affect  Neuro: No apparent focal neurological deficits     [name] MD  Neurology Intern PGY-1

## 2018-06-01 NOTE — Unmapped (Signed)
Pt alert and oriented x 3. Compliant with medications. Tylenol administered for c/o pain in LLE x 1. Effective. Incontinent of bladder and bowel this shift. Has had 3 loose BMs this shift thus far. VSS. Fall precautions maintained. Will continue to monitor.     Problem: Adult Inpatient Plan of Care  Goal: Absence of Hospital-Acquired Illness or Injury  Outcome: Progressing  Goal: Optimal Comfort and Wellbeing  Outcome: Progressing     Problem: Self-Care Deficit  Goal: Improved Ability to Complete Activities of Daily Living  Outcome: Progressing     Problem: Fall Injury Risk  Goal: Absence of Fall and Fall-Related Injury  Outcome: Progressing     Problem: Diabetes Comorbidity  Goal: Blood Glucose Level Within Desired Range  Outcome: Progressing     Problem: Hypertension Comorbidity  Goal: Blood Pressure in Desired Range  Outcome: Progressing     Problem: Skin Injury Risk Increased  Goal: Skin Health and Integrity  Outcome: Progressing
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
## 2018-06-02 ENCOUNTER — Inpatient Hospital Stay: Payer: Medicare Other

## 2018-06-02 LAB — MANUAL DIFFERENTIAL
BASOPHILS - ABS (DIFF): 0 10*9/L (ref 0.0–0.1)
EOSINOPHILS - ABS (DIFF): 0 10*9/L (ref 0.0–0.4)
EOSINOPHILS - REL (DIFF): 0 %
LYMPHOCYTES - ABS (DIFF): 1.8 10*9/L (ref 1.5–5.0)
LYMPHOCYTES - REL (DIFF): 13 %
MONOCYTES - ABS (DIFF): 0.7 10*9/L (ref 0.2–0.8)
MONOCYTES - REL (DIFF): 5 %
NEUTROPHILS - ABS (DIFF): 1 10*9/L — ABNORMAL LOW (ref 2.0–7.5)
NEUTROPHILS - REL (DIFF): 7 %

## 2018-06-02 LAB — BASIC METABOLIC PANEL
BLOOD UREA NITROGEN: 13 mg/dL (ref 7–21)
BUN / CREAT RATIO: 35
CALCIUM: 7.6 mg/dL — ABNORMAL LOW (ref 8.5–10.2)
CHLORIDE: 93 mmol/L — ABNORMAL LOW (ref 98–107)
CO2: 27 mmol/L (ref 22.0–30.0)
CREATININE: 0.37 mg/dL — ABNORMAL LOW (ref 0.60–1.00)
EGFR CKD-EPI AA FEMALE: >90 mL/min/{1.73_m2} (ref >=60)
EGFR CKD-EPI NON-AA FEMALE: >90 mL/min/{1.73_m2} (ref >=60)
GLUCOSE RANDOM: 243 mg/dL — ABNORMAL HIGH (ref 65–179)
POTASSIUM: 3.9 mmol/L (ref 3.5–5.0)
SODIUM: 126 mmol/L — ABNORMAL LOW (ref 135–145)

## 2018-06-02 LAB — NEUTROPHILS - ABS (DIFF): Lab: 1 — ABNORMAL LOW

## 2018-06-02 LAB — CBC W/ AUTO DIFF
HEMATOCRIT: 22.4 % — ABNORMAL LOW (ref 36.0–46.0)
HEMOGLOBIN: 7.2 g/dL — ABNORMAL LOW (ref 12.0–16.0)
MEAN CORPUSCULAR HEMOGLOBIN CONC: 32.3 g/dL (ref 31.0–37.0)
MEAN CORPUSCULAR HEMOGLOBIN: 29.4 pg (ref 26.0–34.0)
MEAN CORPUSCULAR VOLUME: 91.2 fL (ref 80.0–100.0)
MEAN PLATELET VOLUME: 9.7 fL (ref 7.0–10.0)
NUCLEATED RED BLOOD CELLS: 37 /100{WBCs} — ABNORMAL HIGH (ref <=4)
PLATELET COUNT: 48 10*9/L — ABNORMAL LOW (ref 150–440)
RED BLOOD CELL COUNT: 2.46 10*12/L — ABNORMAL LOW (ref 4.00–5.20)
RED CELL DISTRIBUTION WIDTH: 20 % — ABNORMAL HIGH (ref 12.0–15.0)
WBC ADJUSTED: 14.2 10*9/L — ABNORMAL HIGH (ref 4.5–11.0)

## 2018-06-02 LAB — MAGNESIUM
MAGNESIUM: 1.6 mg/dL (ref 1.6–2.2)
Magnesium:MCnc:Pt:Ser/Plas:Qn:: 1.6

## 2018-06-02 LAB — HEMOGLOBIN: Lab: 6.9 — ABNORMAL LOW

## 2018-06-02 LAB — PLATELET COUNT: Lab: 64 — ABNORMAL LOW

## 2018-06-02 LAB — CHLORIDE URINE: Lab: 70

## 2018-06-02 LAB — HEPATIC FUNCTION PANEL
ALBUMIN: 2.3 g/dL — ABNORMAL LOW (ref 3.5–5.0)
ALKALINE PHOSPHATASE: 125 U/L (ref 38–126)
ALT (SGPT): 28 U/L (ref 15–48)

## 2018-06-02 LAB — SODIUM
Sodium:SCnc:Pt:Ser/Plas:Qn:: 128 — ABNORMAL LOW
Sodium:SCnc:Pt:Ser/Plas:Qn:: 129 — ABNORMAL LOW

## 2018-06-02 LAB — OSMOLALITY URINE: Lab: 255

## 2018-06-02 LAB — CALCIUM: Calcium:MCnc:Pt:Ser/Plas:Qn:: 7.6 — ABNORMAL LOW

## 2018-06-02 LAB — ALKALINE PHOSPHATASE: Alkaline phosphatase:CCnc:Pt:Ser/Plas:Qn:: 125

## 2018-06-02 LAB — SODIUM URINE: Lab: 60

## 2018-06-02 LAB — PHOSPHORUS: Phosphate:MCnc:Pt:Ser/Plas:Qn:: 3.1

## 2018-06-02 LAB — HEMOGLOBIN AND HEMATOCRIT, BLOOD: HEMATOCRIT: 21.7 % — ABNORMAL LOW (ref 36.0–46.0)

## 2018-06-02 LAB — MEAN CORPUSCULAR HEMOGLOBIN: Lab: 29.4

## 2018-06-02 MED ORDER — CARVEDILOL 6.25 MG PO TABS
6.25 | ORAL_TABLET | ORAL | Status: DC
Start: 2018-06-03 — End: 2018-06-02

## 2018-06-02 MED ORDER — MELATONIN 3 MG PO TABS
6.00 | ORAL_TABLET | ORAL | Status: DC
Start: 2018-06-08 — End: 2018-06-02

## 2018-06-02 MED ORDER — AMLODIPINE BESYLATE 5 MG PO TABS
5.00 | ORAL_TABLET | ORAL | Status: DC
Start: 2018-06-09 — End: 2018-06-02

## 2018-06-02 MED ORDER — SODIUM CHLORIDE FLUSH 0.9 % IV SOLN
10.00 | INTRAVENOUS | Status: DC
Start: 2018-06-08 — End: 2018-06-02

## 2018-06-02 NOTE — Unmapped (Addendum)
Pt resting quietly overnight. No falls of issues. BP 180's/70's. MD notified. Left leg firm, warm to touch, edema noted. Left leg elevated on pillows. Transfused 1 unit of platelets. Call bell in reach. Bed alarm on. Will continue to monitor
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
## 2018-06-02 NOTE — Unmapped (Signed)
Pt alert but disoriented  to situation at times and forgetful. Pt has been afebrile with stable Vs, except elevated Bps, however remain under her call parameters, started pt back on home BP meds. Pt w/ no c/o nausea. Pt w/ c/o pain, to her LLE/L side. Pt with active bowel and good urine output, incontinent of both. No new skin breakdown this shift. Pt q2 turn. MD d/c pt's fluids, waiting to see how sodium does. Falls precautions and pt safety maintained.  WCTM.    Problem: Adult Inpatient Plan of Care  Goal: Plan of Care Review  Outcome: Ongoing - Unchanged  Goal: Patient-Specific Goal (Individualization)  Outcome: Ongoing - Unchanged  Goal: Absence of Hospital-Acquired Illness or Injury  Outcome: Ongoing - Unchanged  Goal: Optimal Comfort and Wellbeing  Outcome: Ongoing - Unchanged  Goal: Readiness for Transition of Care  Outcome: Ongoing - Unchanged  Goal: Rounds/Family Conference  Outcome: Ongoing - Unchanged     Problem: Self-Care Deficit  Goal: Improved Ability to Complete Activities of Daily Living  Outcome: Ongoing - Unchanged     Problem: Fall Injury Risk  Goal: Absence of Fall and Fall-Related Injury  Outcome: Ongoing - Unchanged     Problem: Diabetes Comorbidity  Goal: Blood Glucose Level Within Desired Range  Outcome: Ongoing - Unchanged     Problem: Hypertension Comorbidity  Goal: Blood Pressure in Desired Range  Outcome: Ongoing - Unchanged     Problem: Skin Injury Risk Increased  Goal: Skin Health and Integrity  Outcome: Ongoing - Unchanged

## 2018-06-03 ENCOUNTER — Inpatient Hospital Stay: Payer: Medicare Other | Admitting: Family Medicine

## 2018-06-03 ENCOUNTER — Inpatient Hospital Stay: Payer: Medicare Other

## 2018-06-03 LAB — HEPATIC FUNCTION PANEL
ALKALINE PHOSPHATASE: 123 U/L (ref 38–126)
AST (SGOT): 34 U/L (ref 14–38)
BILIRUBIN TOTAL: 2.8 mg/dL — ABNORMAL HIGH (ref 0.0–1.2)
PROTEIN TOTAL: 5.8 g/dL — ABNORMAL LOW (ref 6.5–8.3)

## 2018-06-03 LAB — CBC W/ AUTO DIFF
HEMATOCRIT: 25.4 % — ABNORMAL LOW (ref 36.0–46.0)
MEAN CORPUSCULAR HEMOGLOBIN CONC: 32 g/dL (ref 31.0–37.0)
MEAN CORPUSCULAR HEMOGLOBIN: 28.5 pg (ref 26.0–34.0)
MEAN CORPUSCULAR VOLUME: 89 fL (ref 80.0–100.0)
MEAN PLATELET VOLUME: 8.8 fL (ref 7.0–10.0)
PLATELET COUNT: 70 10*9/L — ABNORMAL LOW (ref 150–440)
RED BLOOD CELL COUNT: 2.85 10*12/L — ABNORMAL LOW (ref 4.00–5.20)
RED CELL DISTRIBUTION WIDTH: 18.9 % — ABNORMAL HIGH (ref 12.0–15.0)
WBC ADJUSTED: 11.4 10*9/L — ABNORMAL HIGH (ref 4.5–11.0)

## 2018-06-03 LAB — BASIC METABOLIC PANEL
ANION GAP: 6 mmol/L — ABNORMAL LOW (ref 9–15)
BLOOD UREA NITROGEN: 15 mg/dL (ref 7–21)
BUN / CREAT RATIO: 43
CALCIUM: 8 mg/dL — ABNORMAL LOW (ref 8.5–10.2)
CHLORIDE: 95 mmol/L — ABNORMAL LOW (ref 98–107)
CO2: 28 mmol/L (ref 22.0–30.0)
CREATININE: 0.35 mg/dL — ABNORMAL LOW (ref 0.60–1.00)
EGFR CKD-EPI AA FEMALE: >90 mL/min/{1.73_m2} (ref >=60)
EGFR CKD-EPI NON-AA FEMALE: >90 mL/min/{1.73_m2} (ref >=60)
GLUCOSE RANDOM: 186 mg/dL — ABNORMAL HIGH (ref 65–179)
POTASSIUM: 4 mmol/L (ref 3.5–5.0)

## 2018-06-03 LAB — MANUAL DIFFERENTIAL
BASOPHILS - ABS (DIFF): 0 10*9/L (ref 0.0–0.1)
BASOPHILS - REL (DIFF): 0 %
BLASTS - REL (DIFF): 75 % (ref <=0)
EOSINOPHILS - ABS (DIFF): 0 10*9/L (ref 0.0–0.4)
EOSINOPHILS - REL (DIFF): 0 %
LYMPHOCYTES - ABS (DIFF): 2.3 10*9/L (ref 1.5–5.0)
LYMPHOCYTES - REL (DIFF): 20 %
NEUTROPHILS - ABS (DIFF): 0.5 10*9/L — ABNORMAL LOW (ref 2.0–7.5)
NEUTROPHILS - REL (DIFF): 4 %

## 2018-06-03 LAB — PHOSPHORUS: Phosphate:MCnc:Pt:Ser/Plas:Qn:: 3.5

## 2018-06-03 LAB — MAGNESIUM: Magnesium:MCnc:Pt:Ser/Plas:Qn:: 1.6

## 2018-06-03 LAB — HYPOCHROMIA

## 2018-06-03 LAB — BILIRUBIN DIRECT: Bilirubin.glucuronidated:MCnc:Pt:Ser/Plas:Qn:: 0.8 — ABNORMAL HIGH

## 2018-06-03 LAB — SODIUM: Sodium:SCnc:Pt:Ser/Plas:Qn:: 129 — ABNORMAL LOW

## 2018-06-03 LAB — BLASTS - REL (DIFF): Lab: 75

## 2018-06-03 LAB — ANION GAP: Anion gap 3:SCnc:Pt:Ser/Plas:Qn:: 6 — ABNORMAL LOW

## 2018-06-03 NOTE — Unmapped (Signed)
A&O, disoriented to situation occasionally, Na=129 this am continue to monitor, Na lab draw every 12 hours. Elevated blood pressure this shift but not above call parameters. 1 unit pRBC given.  IVF stopped this shift. Will continue to monitor.

## 2018-06-03 NOTE — Unmapped (Signed)
VSS, no acute events this shift. Pt remains incont of urine, purewick in place.

## 2018-06-03 NOTE — Unmapped (Signed)
Daily Progress Note    Assessment/Plan:    Principal Problem:    Hematoma of left lower extremity  Active Problems:    Thrombocytopenia (CMS-HCC)    Type 2 diabetes mellitus, without long-term current use of insulin (CMS-HCC)    Essential hypertension    Hyperlipidemia    GERD (gastroesophageal reflux disease)    Acute myeloid leukemia (CMS-HCC)    Anemia  Resolved Problems:    * No resolved hospital problems. 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 She has since become transfusion dependent, being transferred to [facility] for further care.      Anemia, thrombocytopenia with bleeding into left gluteus maximus, left thigh, left arm: LLE CT scan indicates decrease in size of gluteal hematoma.  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 No transfusions required in 36 hours.  - Continue tranexamic acid  - Benign heme consulted, appreciate recs  - PICC Line placed  - LLE CT scan w/o contrast indicates decrease in size of gluteal hematoma  - Transfusion work-up reaction initiated   - Von Willebrand work-up unremarkable   - Maintain active type and screen, blood consent has been obtained   - Transfuse for Hgb < 7, will transfuse for PLT < 50 given hematomas and likely active bleeding  - PT/OT consult  ??  Indirect hyperbilirubinemia: Labs consistent with hemolysis. Intra and extrahepatic biliary ductal dilation on CT of uncertain etiology   - Coombs test is negative.  Consider cold immunoglobulins.  - Abdominal ultrasound indicates moderate intrahepatic biliary dilation.  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    - Radiology recommends MRI of liver and/or MRCP; deferred as hyperbilirubinemia is improving  ??  Hyponatremia: Stable.  - D/Cd fluids to assess for stability for discharge  - DCd q8 Na checks  - Strict I/Os    AML: Still with high degree of blasts (~80%). Followed by Dr. Lonni Fix. S/p 1 cycle azacytidine+venetoclax, next cycle on hold. Tumor lysis labs reassuring.   - Dr. Lonni Fix aware of admission  - CTM WBC and % blasts  ??  HTN: Resumed lisinopril and coreg   - Restarted home amlodipine 5 mg daily  ??  T2DM:   - hold home metformin  - SSI  ??  GERD:   - Continue Protonix 40 mg BID    Dispo:  Working on discharge to SNF pending approval from [place]. Need to communicate plan for line (continuation or discontinuation) prior to discharge.  ___________________________________________________________________    Subjective:  Continued leg soreness. Was able to work with PT/OT today.    Labs/Studies:  Labs and Studies from the last 24hrs per EMR and Reviewed    Objective:  BP 170/78  - Pulse 87  - Temp 36.1 ??C (Oral) Comment: STOP - Resp 24  - Ht 165.1 cm (5' 5)  - Wt 66 kg (145 lb 8.1 oz)  - SpO2 95%  - BMI 24.21 kg/m??     General: A&O, NAD, responds appropriately to questions   Cv: RRR, no m/r/g  Resp: Non-labored, symmetrical chest wall expansion  GI: Soft, non-tender, non-distended  MSK: Normal ROM, swelling of LUE and LLE  Derm: Significant bruising along LUE and LLE.     Psych: Cooperative, appropriate mood and affect  Neuro: No apparent focal neurological deficits     [name] MD  Neurology Intern PGY-1

## 2018-06-03 NOTE — Unmapped (Signed)
Pt alert and oriented, disoriented to situation only.  VSS, no falls and afebrile.  No acute events overnight. Pt denies pain.  Pt remains on room air.  Safety measures remain in place with bed alarm on.  Pt turned every 2 hours.  Will continue to monitor.    Vitals:    06/02/18 1906 06/02/18 2012 06/02/18 2309 06/03/18 0320   BP: 140/67 165/73 167/72 157/70   Pulse: 95 95 94 95   Resp: 18  18 19    Temp: 36.4 ??C (97.5 ??F)  36.8 ??C (98.3 ??F) 36.6 ??C (97.8 ??F)   TempSrc: Oral  Oral Oral   SpO2: 96%  98% 97%   Weight:       Height:

## 2018-06-04 ENCOUNTER — Inpatient Hospital Stay: Payer: Medicare Other

## 2018-06-04 LAB — MANUAL DIFFERENTIAL
BASOPHILS - ABS (DIFF): 0 10*9/L (ref 0.0–0.1)
BASOPHILS - REL (DIFF): 0 %
BLASTS - REL (DIFF): 82 % (ref <=0)
EOSINOPHILS - ABS (DIFF): 0 10*9/L (ref 0.0–0.4)
EOSINOPHILS - REL (DIFF): 0 %
LYMPHOCYTES - REL (DIFF): 16 %
MONOCYTES - REL (DIFF): 0 %
NEUTROPHILS - REL (DIFF): 2 %

## 2018-06-04 LAB — BASIC METABOLIC PANEL
BLOOD UREA NITROGEN: 17 mg/dL (ref 7–21)
BUN / CREAT RATIO: 55
CALCIUM: 7.8 mg/dL — ABNORMAL LOW (ref 8.5–10.2)
CHLORIDE: 92 mmol/L — ABNORMAL LOW (ref 98–107)
CO2: 30 mmol/L (ref 22.0–30.0)
CREATININE: 0.31 mg/dL — ABNORMAL LOW (ref 0.60–1.00)
EGFR CKD-EPI AA FEMALE: >90 mL/min/{1.73_m2} (ref >=60)
EGFR CKD-EPI NON-AA FEMALE: >90 mL/min/{1.73_m2} (ref >=60)
POTASSIUM: 3.9 mmol/L (ref 3.5–5.0)
SODIUM: 127 mmol/L — ABNORMAL LOW (ref 135–145)

## 2018-06-04 LAB — MAGNESIUM: Magnesium:MCnc:Pt:Ser/Plas:Qn:: 1.6

## 2018-06-04 LAB — BUN / CREAT RATIO: Urea nitrogen/Creatinine:MRto:Pt:Ser/Plas:Qn:: 55

## 2018-06-04 LAB — CBC W/ AUTO DIFF
HEMATOCRIT: 25.2 % — ABNORMAL LOW (ref 36.0–46.0)
HEMOGLOBIN: 7.8 g/dL — ABNORMAL LOW (ref 12.0–16.0)
MEAN CORPUSCULAR HEMOGLOBIN CONC: 31.1 g/dL (ref 31.0–37.0)
MEAN CORPUSCULAR VOLUME: 89.8 fL (ref 80.0–100.0)
MEAN PLATELET VOLUME: 9.7 fL (ref 7.0–10.0)
NUCLEATED RED BLOOD CELLS: 82 /100{WBCs} — ABNORMAL HIGH (ref <=4)
PLATELET COUNT: 58 10*9/L — ABNORMAL LOW (ref 150–440)
RED BLOOD CELL COUNT: 2.81 10*12/L — ABNORMAL LOW (ref 4.00–5.20)
RED CELL DISTRIBUTION WIDTH: 18.7 % — ABNORMAL HIGH (ref 12.0–15.0)
WBC ADJUSTED: 9.9 10*9/L (ref 4.5–11.0)

## 2018-06-04 LAB — HEPATIC FUNCTION PANEL
ALKALINE PHOSPHATASE: 117 U/L (ref 38–126)
AST (SGOT): 33 U/L (ref 14–38)
BILIRUBIN TOTAL: 2.8 mg/dL — ABNORMAL HIGH (ref 0.0–1.2)
PROTEIN TOTAL: 5.8 g/dL — ABNORMAL LOW (ref 6.5–8.3)

## 2018-06-04 LAB — ANISOCYTOSIS

## 2018-06-04 LAB — LYMPHOCYTES - REL (DIFF): Lab: 16

## 2018-06-04 LAB — PHOSPHORUS: Phosphate:MCnc:Pt:Ser/Plas:Qn:: 3.5

## 2018-06-04 LAB — BILIRUBIN DIRECT: Bilirubin.glucuronidated:MCnc:Pt:Ser/Plas:Qn:: 0.7 — ABNORMAL HIGH

## 2018-06-04 LAB — SODIUM: Sodium:SCnc:Pt:Ser/Plas:Qn:: 129 — ABNORMAL LOW

## 2018-06-04 NOTE — Unmapped (Addendum)
Pt is alert and oriented X4, afebrile vital signs stable. No complains of pain, nausea, vomit. Sliding scale insulin provided during shift.  Pt free of falls or injuries.  Pt was turned q 2hr. Pt bowel and urine  incontinent   Will continue to monitor.

## 2018-06-04 NOTE — Unmapped (Signed)
Daily Progress Note    Interval history:  NAE o/n. Patient states she feels well today. Hematomas appear stable to improved. Sodium stable with decreased free water intake. Medically ready for discharge.     Assessment/Plan:    Principal Problem:    Hematoma of left lower extremity  Active Problems:    Thrombocytopenia (CMS-HCC)    Type 2 diabetes mellitus, without long-term current use of insulin (CMS-HCC)    Essential hypertension    Hyperlipidemia    GERD (gastroesophageal reflux disease)    Acute myeloid leukemia (CMS-HCC)    Anemia  Resolved Problems:    * No resolved hospital problems. 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      Anemia, thrombocytopenia with bleeding into left gluteus maximus, left thigh, left arm: LLE CT scan indicates decrease in size of gluteal hematoma.  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   - Continue tranexamic acid  - Benign heme consulted, appreciate recs  - PICC Line placed  - LLE CT scan w/o contrast indicates decrease in size of gluteal hematoma  - Transfusion work-up reaction initiated   - Von Willebrand work-up unremarkable   - Maintain active type and screen, blood consent has been obtained   - Transfuse for Hgb < 7, will transfuse for PLT < 50 given hematomas and likely active bleeding  - PT/OT consult  ??  Indirect hyperbilirubinemia: Labs consistent with hemolysis. Intra and extrahepatic biliary ductal dilation on CT of uncertain etiology   - Coombs test is negative.  Consider cold immunoglobulins.  - Abdominal ultrasound indicates moderate intrahepatic biliary dilation.  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    - Radiology recommends MRI of liver and/or MRCP; deferred as hyperbilirubinemia is improving. Will be done as outpatient.   ??  Hyponatremia: Improved. Appears secondary to excessive free water intake.   - No longer requiring fluids.   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 Followed by Tracey Lang. S/p 1 cycle azacytidine+venetoclax, next cycle on hold. Tumor lysis labs reassuring.   - Tracey Lang aware of admission  - CTM WBC and % blasts  ??  HTN: Resumed lisinopril and coreg   - Restarted home amlodipine 5 mg daily  ??  T2DM:   - hold home metformin  - SSI  ??  GERD:   - Continue Protonix 40 mg BID    Dispo:  Working on discharge to SNF pending approval from [place]. Need to communicate plan for line (continuation or discontinuation) prior to discharge.  ___________________________________________________________________    Labs/Studies:  Labs and Studies from the last 24hrs per EMR and Reviewed    Objective:  BP 127/58  - Pulse 85  - Temp 35.3 ??C (Oral)  - Resp 20  - Ht 165.1 cm (5' 5)  - Wt 68 kg (149 lb 14.6 oz)  - SpO2 99%  - BMI 24.95 kg/m??     General: A&O, NAD, responds appropriately to questions   Cv: RRR, no m/r/g  Resp: Non-labored, symmetrical chest wall  expansion  GI: Soft, non-tender, non-distended  MSK: Normal ROM, swelling of LUE and LLE  Derm: Significant bruising along LUE and LLE, appears stable to improved. Continues to be tender to palpation.     Psych: Cooperative, appropriate mood and affect  Neuro: No apparent focal neurological deficits

## 2018-06-04 NOTE — Unmapped (Signed)
Daily Progress Note    Interval history:  NAEON. Has not required transfusions in the past 2 days. Dr. Lonni Fix spoke to patient and husband separately regarding futility of future treatment. Per long conversation with husband, he understands this and will discuss this with his wife as an ongoing GOC discussion. In the meantime, we will continue to pursue SNF placement and encourage conversations between husband, wife, Dr. Lonni Fix, and Dr. Quentin Ore (outpatient local oncologist) about GOC. Also reiterated to husband that hematoma will take likely months to resolve.    Assessment/Plan:    Principal Problem:    Hematoma of left lower extremity  Active Problems:    Thrombocytopenia (CMS-HCC)    Type 2 diabetes mellitus, without long-term current use of insulin (CMS-HCC)    Essential hypertension    Hyperlipidemia    GERD (gastroesophageal reflux disease)    Acute myeloid leukemia (CMS-HCC)    Anemia  Resolved Problems:    * No resolved hospital problems. 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      Anemia, thrombocytopenia with bleeding into left gluteus maximus, left thigh, left arm: LLE CT scan indicates decrease in size of gluteal hematoma.  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   - Continue tranexamic acid  - Benign heme consulted, appreciate recs  - PICC Line placed  - LLE CT scan w/o contrast indicates decrease in size of gluteal hematoma  - Transfusion work-up reaction initiated   - Von Willebrand work-up unremarkable   - Maintain active type and screen, blood consent has been obtained   - Transfuse for Hgb < 7, will transfuse for PLT < 50 given hematomas and likely active bleeding  - PT/OT consult  ??  Indirect hyperbilirubinemia: Labs consistent with hemolysis. Intra and extrahepatic biliary ductal dilation on CT of uncertain etiology   - Coombs test is negative.  Consider cold immunoglobulins.  - Abdominal ultrasound indicates moderate intrahepatic biliary dilation.  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    - Radiology recommends MRI of liver and/or MRCP; deferred as hyperbilirubinemia is improving. Will be done as outpatient.   ??  Hyponatremia: Stable. Appears secondary to excessive free water intake.   - No longer requiring fluids.   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 Followed by Dr. Lonni Fix. S/p 1 cycle azacytidine+venetoclax, next cycle on hold. Tumor lysis labs reassuring.   - CTM WBC and % blasts  - Encourage ongoing GOC discussions but pursue SNF for now  ??  HTN: Resumed lisinopril and coreg   - Restarted home amlodipine 5 mg daily  ??  T2DM:   - hold home metformin  - SSI  ??  GERD:   - Continue Protonix 40 mg BID    Dispo:  Working on discharge to SNF pending approval from [place]. Need to communicate plan for line (continuation or discontinuation) prior to discharge.  ___________________________________________________________________    Labs/Studies:  Labs and Studies from the last 24hrs per EMR and Reviewed    Objective:  BP 145/99  -  Pulse 95  - Temp 36.1 ??C (Oral)  - Resp 15  - Ht 165.1 cm (5' 5)  - Wt 68.2 kg (150 lb 5.7 oz)  - SpO2 99%  - BMI 25.02 kg/m??     General: A&O, NAD, responds appropriately to questions   Cv: RRR, no m/r/g  Resp: Non-labored, symmetrical chest wall expansion  GI: Soft, non-tender, non-distended  MSK: Normal ROM, swelling of LUE and LLE- mildly improved  Derm: Significant bruising along LUE and LLE, appears stable to improved. Continues to be tender to palpation.     Psych: Cooperative, appropriate mood and affect  Neuro: No apparent focal neurological deficits     [name] MD  Neurology Intern PGY-1

## 2018-06-04 NOTE — Unmapped (Signed)
Pt alert and oriented except to situation. VSS, no falls and afebrile.  No acute events overnight. Pt denies pain.  Pt remains on room air with good results.  Safety measures remain in place with bed alarm on.  Will continue to monitor.    Vitals:    06/03/18 1640 06/03/18 1953 06/03/18 2327 06/04/18 0414   BP:  142/74 160/74 146/71   Pulse:  95 88 98   Resp:  20 20 20    Temp:  36.2 ??C (97.1 ??F) 36.9 ??C (98.5 ??F) 37 ??C (98.6 ??F)   TempSrc:  Oral Oral Oral   SpO2:  97% 96% 98%   Weight: 68 kg (149 lb 14.6 oz)      Height:

## 2018-06-04 NOTE — Unmapped (Signed)
Adult Nutrition Follow Up    ASSESSMENT:  Nutritionally Pertinent Events since last RD visit:  Patient reports ongoing decreased appetite. She has been consuming 25-50% of 3 meals most days, and reports supplementing with ensure BID.     Nutritionally pertinent meds, labs, hospital course reviewed.    Patient Lines/Drains/Airways Status    Active Wounds     None                 Last 5 Recorded Weights    05/31/18 1100 06/01/18 1800 06/02/18 1733 06/03/18 1640   Weight: 66.9 kg (147 lb 7.8 oz) 67.5 kg (148 lb 13 oz) 66 kg (145 lb 8.1 oz) 68 kg (149 lb 14.6 oz)    06/04/18 0907   Weight: 68.2 kg (150 lb 5.7 oz)        Nutrition Orders          Supplement # of Products PER Serving: 1; Standard High Calorie 2xd PC starting at 09/20 1800    Nutrition Therapy General (Regular) starting at 09/19 2156            Daily Estimated Nutrient Needs:    Energy: 1590 kcals [25-30 kcal/kg using admission body weight, 53 kg (05/28/18 1257)]  Protein: 64-80 gm [1.2-1.5 gm/kg using admission body weight, 53 kg (05/28/18 1257)]  Carbohydrate:   [45-60% of kcal]  Fluid:   [1 mL/kcal (maintenance)]     DIAGNOSIS:  Malnutrition Assessment using AND/ASPEN Clinical Characteristics:    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 Glucose levels currently being managed with sliding scale insulin. RD will hold any CHO restriction as oral intake is lower. She would benefit from continued intake of ensure BID to help meet nutritional needs.   - Malnutrition related to fatigue/anemia, chemo treatment side effects as evidenced by muscle/fat losses, reduced oral intake.    GOALS:  Oral Intake:       - Patient to consume >50% of most meals per day. (not met, ongoing)   - Patient to consume >75% of all oral supplements per day. (met, ongoing)    RECOMMENDATIONS AND INTERVENTIONS:  Continue ensure BID  Encouraged small/frequent meals while appetite is low   Assist with meal set-up all meals    RD Follow Up Parameters:  1-2 times per week (and more frequent as indicated)    Gladys Damme MS, RD, LD, CNSC  [phone]

## 2018-06-05 LAB — CBC W/ AUTO DIFF
HEMATOCRIT: 26.8 % — ABNORMAL LOW (ref 36.0–46.0)
HEMOGLOBIN: 8.6 g/dL — ABNORMAL LOW (ref 12.0–16.0)
MEAN CORPUSCULAR HEMOGLOBIN CONC: 32.2 g/dL (ref 31.0–37.0)
MEAN CORPUSCULAR HEMOGLOBIN: 28.9 pg (ref 26.0–34.0)
MEAN CORPUSCULAR VOLUME: 89.8 fL (ref 80.0–100.0)
MEAN PLATELET VOLUME: 10.4 fL — ABNORMAL HIGH (ref 7.0–10.0)
NUCLEATED RED BLOOD CELLS: 74 /100{WBCs} — ABNORMAL HIGH (ref <=4)
RED BLOOD CELL COUNT: 2.98 10*12/L — ABNORMAL LOW (ref 4.00–5.20)
RED CELL DISTRIBUTION WIDTH: 18.4 % — ABNORMAL HIGH (ref 12.0–15.0)
WBC ADJUSTED: 11.3 10*9/L — ABNORMAL HIGH (ref 4.5–11.0)

## 2018-06-05 LAB — HEPATIC FUNCTION PANEL
ALBUMIN: 2.4 g/dL — ABNORMAL LOW (ref 3.5–5.0)
ALKALINE PHOSPHATASE: 115 U/L (ref 38–126)
ALT (SGPT): 24 U/L (ref 15–48)
BILIRUBIN DIRECT: 0.5 mg/dL — ABNORMAL HIGH (ref 0.00–0.40)

## 2018-06-05 LAB — BASIC METABOLIC PANEL
ANION GAP: 3 mmol/L — ABNORMAL LOW (ref 7–15)
BLOOD UREA NITROGEN: 14 mg/dL (ref 7–21)
CALCIUM: 7.9 mg/dL — ABNORMAL LOW (ref 8.5–10.2)
CHLORIDE: 94 mmol/L — ABNORMAL LOW (ref 98–107)
CO2: 32 mmol/L — ABNORMAL HIGH (ref 22.0–30.0)
CREATININE: 0.31 mg/dL — ABNORMAL LOW (ref 0.60–1.00)
EGFR CKD-EPI NON-AA FEMALE: >90 mL/min/{1.73_m2} (ref >=60)
GLUCOSE RANDOM: 114 mg/dL (ref 65–179)
POTASSIUM: 3.6 mmol/L (ref 3.5–5.0)
SODIUM: 129 mmol/L — ABNORMAL LOW (ref 135–145)

## 2018-06-05 LAB — MANUAL DIFFERENTIAL
BLASTS - REL (DIFF): 84 % (ref <=0)
LYMPHOCYTES - REL (DIFF): 12 %
MONOCYTES - ABS (DIFF): 0 10*9/L — ABNORMAL LOW (ref 0.2–0.8)
MONOCYTES - REL (DIFF): 0 %
NEUTROPHILS - ABS (DIFF): 0.5 10*9/L — ABNORMAL LOW (ref 2.0–7.5)
NEUTROPHILS - REL (DIFF): 4 %

## 2018-06-05 LAB — EOSINOPHILS - ABS (DIFF): Lab: 0

## 2018-06-05 LAB — SODIUM: Sodium:SCnc:Pt:Ser/Plas:Qn:: 130 — ABNORMAL LOW

## 2018-06-05 LAB — BUN / CREAT RATIO: Urea nitrogen/Creatinine:MRto:Pt:Ser/Plas:Qn:: 45

## 2018-06-05 LAB — MEAN PLATELET VOLUME: Lab: 10.4 — ABNORMAL HIGH

## 2018-06-05 LAB — MAGNESIUM: Magnesium:MCnc:Pt:Ser/Plas:Qn:: 1.6

## 2018-06-05 LAB — ALBUMIN: Albumin:MCnc:Pt:Ser/Plas:Qn:: 2.4 — ABNORMAL LOW

## 2018-06-05 LAB — PHOSPHORUS: Phosphate:MCnc:Pt:Ser/Plas:Qn:: 3.6

## 2018-06-05 NOTE — Unmapped (Signed)
Pt alert and oriented x3. Q2 hour turn. Sacrum pink, blanchable, mepilex in place for protection. LLE swollen, very weak. Awaiting for PT to come see on Sunday prior to d/c to SNF. Continues to have incontinence. VSS and afebrile. Will continue to monitor.

## 2018-06-05 NOTE — Unmapped (Signed)
Daily Progress Note    Interval history:  NAEON. Has not required transfusions in the past 3 days. Patient has been accepted to SNF; insurance is pending.    Assessment/Plan:    Principal Problem:    Hematoma of left lower extremity  Active Problems:    Thrombocytopenia (CMS-HCC)    Type 2 diabetes mellitus, without long-term current use of insulin (CMS-HCC)    Essential hypertension    Hyperlipidemia    GERD (gastroesophageal reflux disease)    Acute myeloid leukemia (CMS-HCC)    Anemia  Resolved Problems:    * No resolved hospital problems. 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      Anemia, thrombocytopenia with bleeding into left gluteus maximus, left thigh, left arm: LLE CT scan indicates decrease in size of gluteal hematoma.  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   - Continue tranexamic acid  - Benign heme consulted, appreciate recs  - PICC Line placed  - LLE CT scan w/o contrast indicates decrease in size of gluteal hematoma  - Transfusion work-up reaction initiated   - Von Willebrand work-up unremarkable   - Maintain active type and screen, blood consent has been obtained   - Transfuse for Hgb < 7, will transfuse for PLT < 30 given hematomas and likely active bleeding  - PT/OT consult  ??  Indirect hyperbilirubinemia: Labs consistent with hemolysis. Intra and extrahepatic biliary ductal dilation on CT of uncertain etiology   - Coombs test is negative.  Consider cold immunoglobulins.  - Abdominal ultrasound indicates moderate intrahepatic biliary dilation.  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    - Radiology recommends MRI of liver and/or MRCP; deferred as hyperbilirubinemia is improving. Will be done as outpatient.   ??  Hyponatremia: Stable. Appears secondary to excessive free water intake.   - No longer requiring fluids.   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 Followed by Dr. Lonni Fix. S/p 1 cycle azacytidine+venetoclax, next cycle on hold. Tumor lysis labs reassuring.   - CTM WBC and % blasts  - Encourage ongoing GOC discussions but pursue SNF for now  ??  HTN: Resumed lisinopril and coreg   - Restarted home amlodipine 5 mg daily  ??  T2DM:   - hold home metformin  - SSI  ??  GERD:   - Continue Protonix 40 mg BID    Dispo:  Working on discharge to SNF pending approval from [facility]. Need to communicate plan for line (continuation or discontinuation) prior to discharge.  ___________________________________________________________________    Labs/Studies:  Labs and Studies from the last 24hrs per EMR and Reviewed    Objective:  BP (P) 151/74  - Pulse (P) 93  - Temp (P) 36.2 ??C (Oral)  - Resp (P) 16  - Ht 165.1 cm (5' 5)  - Wt 68.2 kg (150 lb 5.7 oz)  - SpO2 (P) 97%  - BMI 25.02 kg/m??     General: A&O, NAD, responds appropriately to questions   Cv: RRR, no m/r/g  Resp: Non-labored,  symmetrical chest wall expansion  GI: Soft, non-tender, non-distended  MSK: Normal ROM, swelling of LUE and LLE-improved  Derm: Significant bruising along LUE and LLE-improved. Continues to be tender to palpation.     Psych: Cooperative, appropriate mood and affect  Neuro: No apparent focal neurological deficits     [name] MD  Neurology Intern PGY-1

## 2018-06-06 LAB — CBC W/ AUTO DIFF
HEMATOCRIT: 26.3 % — ABNORMAL LOW (ref 36.0–46.0)
HEMOGLOBIN: 8.5 g/dL — ABNORMAL LOW (ref 12.0–16.0)
MEAN CORPUSCULAR HEMOGLOBIN CONC: 32.3 g/dL (ref 31.0–37.0)
MEAN CORPUSCULAR VOLUME: 90.4 fL (ref 80.0–100.0)
MEAN PLATELET VOLUME: 11.3 fL — ABNORMAL HIGH (ref 7.0–10.0)
NUCLEATED RED BLOOD CELLS: 72 /100{WBCs} — ABNORMAL HIGH (ref <=4)
PLATELET COUNT: 31 10*9/L — ABNORMAL LOW (ref 150–440)
RED CELL DISTRIBUTION WIDTH: 18.5 % — ABNORMAL HIGH (ref 12.0–15.0)
WBC ADJUSTED: 10.9 10*9/L (ref 4.5–11.0)

## 2018-06-06 LAB — BASIC METABOLIC PANEL
ANION GAP: 4 mmol/L — ABNORMAL LOW (ref 7–15)
BLOOD UREA NITROGEN: 18 mg/dL (ref 7–21)
BUN / CREAT RATIO: 55
CHLORIDE: 94 mmol/L — ABNORMAL LOW (ref 98–107)
CREATININE: 0.33 mg/dL — ABNORMAL LOW (ref 0.60–1.00)
EGFR CKD-EPI AA FEMALE: >90 mL/min/{1.73_m2} (ref >=60)
EGFR CKD-EPI NON-AA FEMALE: >90 mL/min/{1.73_m2} (ref >=60)
GLUCOSE RANDOM: 158 mg/dL (ref 65–179)
POTASSIUM: 3.8 mmol/L (ref 3.5–5.0)
SODIUM: 131 mmol/L — ABNORMAL LOW (ref 135–145)

## 2018-06-06 LAB — MANUAL DIFFERENTIAL
BASOPHILS - ABS (DIFF): 0 10*9/L (ref 0.0–0.1)
BASOPHILS - REL (DIFF): 0 %
BLASTS - REL (DIFF): 82 % (ref <=0)
EOSINOPHILS - REL (DIFF): 0 %
LYMPHOCYTES - ABS (DIFF): 1.2 10*9/L — ABNORMAL LOW (ref 1.5–5.0)
LYMPHOCYTES - REL (DIFF): 11 %
MONOCYTES - ABS (DIFF): 0.3 10*9/L (ref 0.2–0.8)
MONOCYTES - REL (DIFF): 3 %
NEUTROPHILS - ABS (DIFF): 0.4 10*9/L — CL (ref 2.0–7.5)

## 2018-06-06 LAB — SODIUM: Sodium:SCnc:Pt:Ser/Plas:Qn:: 131 — ABNORMAL LOW

## 2018-06-06 LAB — URINALYSIS
BILIRUBIN UA: NEGATIVE
GLUCOSE UA: 70 — AB
KETONES UA: NEGATIVE
LEUKOCYTE ESTERASE UA: NEGATIVE
NITRITE UA: NEGATIVE
PH UA: 7.5 (ref 5.0–9.0)
PROTEIN UA: 30 — AB
RBC UA: 86 /HPF — ABNORMAL HIGH (ref <=4)
SPECIFIC GRAVITY UA: 1.012 (ref 1.003–1.030)
SQUAMOUS EPITHELIAL: <1 /HPF (ref 0–5)
WBC UA: 2 /HPF (ref 0–5)

## 2018-06-06 LAB — HEPATIC FUNCTION PANEL
ALBUMIN: 2.5 g/dL — ABNORMAL LOW (ref 3.5–5.0)
ALKALINE PHOSPHATASE: 101 U/L (ref 38–126)
BILIRUBIN TOTAL: 3 mg/dL — ABNORMAL HIGH (ref 0.0–1.2)
PROTEIN TOTAL: 6.2 g/dL — ABNORMAL LOW (ref 6.5–8.3)

## 2018-06-06 LAB — MAGNESIUM: Magnesium:MCnc:Pt:Ser/Plas:Qn:: 1.6

## 2018-06-06 LAB — PHOSPHORUS: Phosphate:MCnc:Pt:Ser/Plas:Qn:: 3.6

## 2018-06-06 LAB — NEUTROPHILS - REL (DIFF): Lab: 4

## 2018-06-06 LAB — HEMATOCRIT: Lab: 26.3 — ABNORMAL LOW

## 2018-06-06 LAB — PROTEIN UA: Protein:MCnc:Pt:Urine:Qn:Test strip: 30 — AB

## 2018-06-06 LAB — CO2: Carbon dioxide:SCnc:Pt:Ser/Plas:Qn:: 33 — ABNORMAL HIGH

## 2018-06-06 LAB — ALBUMIN: Albumin:MCnc:Pt:Ser/Plas:Qn:: 2.5 — ABNORMAL LOW

## 2018-06-06 NOTE — Unmapped (Signed)
Pt. Incontinent to urine and stool, very restless, called out many times during shift for multiple needs. Placed Purewick and got PRN order for Atarax for anxiety and itching and she was able to rest. Small erythemic area at top of gluteal cleft and foam padded dressing was placed and changed with each bowel movement. LLE and labia remain very swollen and taut.      Problem: Self-Care Deficit  Goal: Improved Ability to Complete Activities of Daily Living  Outcome: Not Progressing     Problem: Skin Injury Risk Increased  Goal: Skin Health and Integrity  Outcome: Not Progressing     Problem: Adult Inpatient Plan of Care  Goal: Plan of Care Review  Outcome: Ongoing - Unchanged     Problem: Fall Injury Risk  Goal: Absence of Fall and Fall-Related Injury  Outcome: Ongoing - Unchanged     Problem: Diabetes Comorbidity  Goal: Blood Glucose Level Within Desired Range  Outcome: Ongoing - Unchanged     Problem: Hypertension Comorbidity  Goal: Blood Pressure in Desired Range  Outcome: Ongoing - Unchanged
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
## 2018-06-06 NOTE — Unmapped (Signed)
Pt is alert and oriented X4, afebrile vital signs stable. No complains of pain, nausea, vomit. Sliding scale insulin provided during shift.   Pt free of falls or injuries. Will continue to monitor.

## 2018-06-06 NOTE — Unmapped (Signed)
Daily Progress Note    Interval history:  NAEON. Has not required transfusion in 4 days, though platelets are trending down. Extremity swelling improved today. Patient complaining of burning and frequent urination; UA sent. Pt has seen her today and recommended SNF; OT recs pending. Otherwise, patient should be ready to go to SNF tomorrow.    Assessment/Plan:    Principal Problem:    Hematoma of left lower extremity  Active Problems:    Thrombocytopenia (CMS-HCC)    Type 2 diabetes mellitus, without long-term current use of insulin (CMS-HCC)    Essential hypertension    Hyperlipidemia    GERD (gastroesophageal reflux disease)    Acute myeloid leukemia (CMS-HCC)    Anemia  Resolved Problems:    * No resolved hospital problems. 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      Anemia, thrombocytopenia with bleeding into left gluteus maximus, left thigh, left arm: LLE CT scan indicates decrease in size of gluteal hematoma.  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   - Continue tranexamic acid  - Benign heme consulted, appreciate recs  - PICC Line placed  - LLE CT scan w/o contrast indicates decrease in size of gluteal hematoma  - Transfusion work-up reaction initiated   - Von Willebrand work-up unremarkable   - Maintain active type and screen, blood consent has been obtained   - Transfuse for Hgb < 7, will transfuse for PLT < 30 given hematomas and likely active bleeding  - PT/OT consult  ??  Indirect hyperbilirubinemia: Labs consistent with hemolysis. Intra and extrahepatic biliary ductal dilation on CT of uncertain etiology   - Coombs test is negative. Consider cold immunoglobulins.  - Abdominal ultrasound indicates moderate intrahepatic biliary dilation.  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    - Radiology recommends MRI of liver and/or MRCP; deferred as hyperbilirubinemia is improving. Will be done as outpatient.   ??  Hyponatremia: Stable. Appears secondary to excessive free water intake.   - No longer requiring fluids.   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 Followed by Dr. Lonni Fix. S/p 1 cycle azacytidine+venetoclax, next cycle on hold. Tumor lysis labs reassuring.   - CTM WBC and % blasts  - Encourage ongoing GOC discussions but pursue SNF for now  ??  HTN: Resumed lisinopril and coreg   - Restarted home amlodipine 5 mg daily  ??  T2DM:   - hold home metformin  - SSI  ??  GERD:   - Continue Protonix 40 mg BID    Dispo:  Working on discharge to SNF pending approval from [place]. Need to communicate plan for line (continuation or discontinuation) prior to discharge.  ___________________________________________________________________    Labs/Studies:  Labs and Studies from the last 24hrs per EMR and Reviewed    Objective:  BP 171/78  - Pulse 87  - Temp 36.1 ??C (Oral)  - Resp 20  - Ht 165.1 cm (5' 5)  - Wt 64.9 kg (143 lb)  - SpO2 96%  - BMI 23.80 kg/m??  General: A&O, NAD, responds appropriately to questions   Cv: RRR, no m/r/g  Resp: Non-labored, symmetrical chest wall expansion  GI: Soft, non-tender, non-distended  MSK: Normal ROM, swelling of LUE and LLE-improved  Derm: Significant bruising along LUE and LLE-improved. Continues to be tender to palpation.     Psych: Cooperative, appropriate mood and affect  Neuro: No apparent focal neurological deficits     [name] MD  Neurology Intern PGY-1

## 2018-06-07 ENCOUNTER — Ambulatory Visit: Payer: Medicare Other | Admitting: Family Medicine

## 2018-06-07 ENCOUNTER — Inpatient Hospital Stay: Payer: Medicare Other

## 2018-06-07 ENCOUNTER — Encounter
Admission: RE | Admit: 2018-06-07 | Discharge: 2018-06-07 | Disposition: A | Discharge status: Home / Self Care | Payer: Medicare Other | Source: Ambulatory Visit | Attending: Internal Medicine | Admitting: Internal Medicine

## 2018-06-07 LAB — MANUAL DIFFERENTIAL
BASOPHILS - ABS (DIFF): 0 10*9/L (ref 0.0–0.1)
BASOPHILS - REL (DIFF): 0 %
BLASTS - REL (DIFF): 89 % (ref <=0)
EOSINOPHILS - REL (DIFF): 1 %
LYMPHOCYTES - ABS (DIFF): 0.6 10*9/L — ABNORMAL LOW (ref 1.5–5.0)
LYMPHOCYTES - REL (DIFF): 6 %
MONOCYTES - ABS (DIFF): 0.2 10*9/L (ref 0.2–0.8)
MONOCYTES - REL (DIFF): 2 %
NEUTROPHILS - ABS (DIFF): 0.2 10*9/L — CL (ref 2.0–7.5)

## 2018-06-07 LAB — CBC W/ AUTO DIFF
HEMOGLOBIN: 8 g/dL — ABNORMAL LOW (ref 12.0–16.0)
MEAN CORPUSCULAR HEMOGLOBIN CONC: 30.6 g/dL — ABNORMAL LOW (ref 31.0–37.0)
MEAN CORPUSCULAR HEMOGLOBIN: 28.4 pg (ref 26.0–34.0)
MEAN CORPUSCULAR VOLUME: 92.7 fL (ref 80.0–100.0)
MEAN PLATELET VOLUME: 13.2 fL — ABNORMAL HIGH (ref 7.0–10.0)
PLATELET COUNT: 24 10*9/L — ABNORMAL LOW (ref 150–440)
RED BLOOD CELL COUNT: 2.81 10*12/L — ABNORMAL LOW (ref 4.00–5.20)
RED CELL DISTRIBUTION WIDTH: 19.2 % — ABNORMAL HIGH (ref 12.0–15.0)
WBC ADJUSTED: 9.2 10*9/L (ref 4.5–11.0)

## 2018-06-07 LAB — BASIC METABOLIC PANEL
ANION GAP: 3 mmol/L — ABNORMAL LOW (ref 7–15)
BLOOD UREA NITROGEN: 21 mg/dL (ref 7–21)
BUN / CREAT RATIO: 68
CALCIUM: 8 mg/dL — ABNORMAL LOW (ref 8.5–10.2)
CHLORIDE: 96 mmol/L — ABNORMAL LOW (ref 98–107)
CO2: 32 mmol/L — ABNORMAL HIGH (ref 22.0–30.0)
CREATININE: 0.31 mg/dL — ABNORMAL LOW (ref 0.60–1.00)
EGFR CKD-EPI AA FEMALE: >90 mL/min/{1.73_m2} (ref >=60)
GLUCOSE RANDOM: 154 mg/dL (ref 65–179)

## 2018-06-07 LAB — HEPATIC FUNCTION PANEL
ALBUMIN: 2.5 g/dL — ABNORMAL LOW (ref 3.5–5.0)
ALKALINE PHOSPHATASE: 86 U/L (ref 38–126)
ALT (SGPT): 12 U/L — ABNORMAL LOW (ref 15–48)
AST (SGOT): 33 U/L (ref 14–38)
BILIRUBIN DIRECT: 0.5 mg/dL — ABNORMAL HIGH (ref 0.00–0.40)
PROTEIN TOTAL: 6.2 g/dL — ABNORMAL LOW (ref 6.5–8.3)

## 2018-06-07 LAB — PLATELET COUNT: Lab: 70 — ABNORMAL LOW

## 2018-06-07 LAB — NEUTROPHILS - ABS (DIFF): Lab: 0.2 — CL

## 2018-06-07 LAB — PHOSPHORUS: Phosphate:MCnc:Pt:Ser/Plas:Qn:: 3.4

## 2018-06-07 LAB — SODIUM: Sodium:SCnc:Pt:Ser/Plas:Qn:: 129 — ABNORMAL LOW

## 2018-06-07 LAB — RED BLOOD CELL COUNT: Lab: 2.81 — ABNORMAL LOW

## 2018-06-07 LAB — PROTEIN TOTAL: Protein:MCnc:Pt:Ser/Plas:Qn:: 6.2 — ABNORMAL LOW

## 2018-06-07 LAB — CALCIUM: Calcium:MCnc:Pt:Ser/Plas:Qn:: 8 — ABNORMAL LOW

## 2018-06-07 LAB — MAGNESIUM: Magnesium:MCnc:Pt:Ser/Plas:Qn:: 1.7

## 2018-06-07 MED ORDER — HYDROXYZINE HCL 25 MG PO TABS
25.00 | ORAL_TABLET | ORAL | Status: DC
Start: ? — End: 2018-06-07

## 2018-06-07 MED ORDER — LISINOPRIL 20 MG PO TABS
20.00 | ORAL_TABLET | ORAL | Status: DC
Start: 2018-06-09 — End: 2018-06-07

## 2018-06-07 MED ORDER — CARVEDILOL 25 MG PO TABS
25.00 | ORAL_TABLET | ORAL | Status: DC
Start: 2018-06-08 — End: 2018-06-07

## 2018-06-07 NOTE — Unmapped (Signed)
Plan to discharge pt to SNF tomorrow; continues to have episodes of incontinence of both urine & stool; clean catch UA was sent today, no significant findings. Completed sessions with PT/OT successfully. Continue q2h turns, q12h labs per order; pt denies other complaints at this time.     Problem: Adult Inpatient Plan of Care  Goal: Plan of Care Review  Outcome: Ongoing - Unchanged     Problem: Self-Care Deficit  Goal: Improved Ability to Complete Activities of Daily Living  Outcome: Ongoing - Unchanged     Problem: Fall Injury Risk  Goal: Absence of Fall and Fall-Related Injury  Outcome: Ongoing - Unchanged     Problem: Diabetes Comorbidity  Goal: Blood Glucose Level Within Desired Range  Outcome: Ongoing - Unchanged     Problem: Hypertension Comorbidity  Goal: Blood Pressure in Desired Range  Outcome: Ongoing - Unchanged     Problem: Skin Injury Risk Increased  Goal: Skin Health and Integrity  Outcome: Ongoing - Unchanged

## 2018-06-07 NOTE — Unmapped (Signed)
VSS throughout shift, pt remains afebrile. Falls precautions maintained. Q2h continued. Denies n/v/d. Reporting pain in LLE, see MAR for prn intervention. 1u PLT given per orders, pt tolerated well, re-check pending. Plan for pt to D/C to SNF this afternoon. Wctm.     Problem: Adult Inpatient Plan of Care  Goal: Plan of Care Review  Outcome: Ongoing - Unchanged     Problem: Self-Care Deficit  Goal: Improved Ability to Complete Activities of Daily Living  Outcome: Ongoing - Unchanged  Intervention: Promote Activity and Functional Independence  Flowsheets (Taken 06/07/2018 0438)  Activity Assistance Provided: assistance, 1 person  Self-Care Promotion: independence encouraged     Problem: Fall Injury Risk  Goal: Absence of Fall and Fall-Related Injury  Outcome: Ongoing - Unchanged  Intervention: Identify and Manage Contributors to Fall Injury Risk  Flowsheets (Taken 06/07/2018 0438)  Medication Review/Management:   Self-Care Promotion: independence encouraged  Intervention: Promote Injury-Free Environment  Flowsheets (Taken 06/06/2018 1905)  Safety Interventions: fall reduction program maintained;lighting adjusted for tasks/safety;low bed;nonskid shoes/slippers when out of bed;bed alarm     Problem: Diabetes Comorbidity  Goal: Blood Glucose Level Within Desired Range  Outcome: Ongoing - Unchanged  Intervention: Maintain Glycemic Control  Flowsheets (Taken 06/07/2018 0438)  Glycemic Management: blood glucose monitoring     Problem: Hypertension Comorbidity  Goal: Blood Pressure in Desired Range  Outcome: Ongoing - Unchanged  Intervention: Maintain Hypertension-Management Strategies  Flowsheets (Taken 06/07/2018 0438)  Medication Review/Management: medications reviewed     Problem: Skin Injury Risk Increased  Goal: Skin Health and Integrity  Outcome: Ongoing - Unchanged  Intervention: Optimize Skin Protection  Flowsheets (Taken 06/07/2018 0438)  Pressure Reduction Techniques: frequent weight shift encouraged; heels elevated off bed  Pressure Reduction Devices: pressure-redistributing mattress utilized  Skin Protection: adhesive use limited

## 2018-06-07 NOTE — Unmapped (Signed)
Daily Progress Note  Attending Attestation:  I saw and evaluated the patient, and participated in the key portions of the service. I reviewed and edited the resident's note and agree with the findings and plan.     [name], MD  Associate Professor  Heme/Onc    ===================================================      Interval history:  NAEON. No bleeding, hematoma is stable to improved and is substantially improved since admission. UA was sent yesterday given patient concern of burning with urination. UA was negative for infection. Plan is SNF today.    Assessment/Plan:    Principal Problem:    Hematoma of left lower extremity  Active Problems:    Thrombocytopenia (CMS-HCC)    Type 2 diabetes mellitus, without long-term current use of insulin (CMS-HCC)    Essential hypertension    Hyperlipidemia    GERD (gastroesophageal reflux disease)    Acute myeloid leukemia (CMS-HCC)    Anemia  Resolved Problems:    * No resolved hospital problems. 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 Clinically hematoma has substantially improved.  Transfusion dependency due to hematoma in setting of thrombocytopenia and fall. Patient's transfusion requirements have substantially decreased and as of this time does not require hemoglobin and may require platelets on average once per week.  - Continue tranexamic acid  - Benign heme consulted, appreciate recs  - PICC Line placed; will leave PICC in place until patient can be evaluated by Dr. Lonni Fix at the end of October. She will need PICC for transfusions as it is very difficult to get PIV access for her.  - LLE CT scan w/o contrast indicates decrease in size of gluteal hematoma  - Transfusion work-up reaction initiated   - Von Willebrand work-up unremarkable   - Maintain active type and screen, blood consent has been obtained   - Transfuse for Hgb < 7, will transfuse for PLT < 30 given hematomas and likely active bleeding  - PT/OT consult  ??  Indirect hyperbilirubinemia: Labs consistent with hemolysis. Intra and extrahepatic biliary ductal dilation on CT of uncertain etiology   - Coombs test is negative.  Consider cold immunoglobulins.  - Abdominal ultrasound indicates moderate intrahepatic biliary dilation.  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    - Radiology recommends MRI of liver and/or MRCP; deferred as hyperbilirubinemia is improving. Will be done as outpatient.   ??  Hyponatremia: Stable. Appears secondary to excessive free water intake.   - No longer requiring fluids.   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 Followed by Dr. Lonni Fix. S/p 1 cycle azacytidine+venetoclax, next cycle on hold. Tumor lysis labs reassuring.   - CTM WBC and % blasts  - Encourage ongoing GOC discussions but pursue SNF for now  - Appointment with Dr. Lonni Fix scheduled for end of October  ??  HTN: Resumed lisinopril and  coreg   - Restarted home amlodipine 5 mg daily  - Continue Coreg  ??  T2DM:   - hold home metformin  - SSI  ??  GERD:   - Continue Protonix 40 mg BID    Dispo:  Working on discharge to SNF pending approval from [name]. Will keep PICC in place until appointment with Dr. Lonni Fix.  ___________________________________________________________________    Labs/Studies:  Labs and Studies from the last 24hrs per EMR and Reviewed    Objective:  BP 178/81  - Pulse 88  - Temp 35.9 ??C (Oral)  - Resp 18  - Ht 165.1 cm (5' 5)  - Wt 62.9 kg (138 lb 10.7 oz)  - SpO2 97%  - BMI 23.08 kg/m??     General: A&O, NAD, responds appropriately to questions   Cv: RRR, no m/r/g  Resp: Non-labored, symmetrical chest wall expansion  GI: Soft, non-tender, non-distended  MSK: Normal ROM, swelling of LUE and LLE-improved  skin: Significant bruising along LUE and LLE-improved. Continues to be tender to palpation.     Psych: Cooperative, appropriate mood and affect  Neuro: No apparent focal neurological deficits     [name] MD  Neurology Intern PGY-1

## 2018-06-08 ENCOUNTER — Inpatient Hospital Stay: Payer: Medicare Other | Attending: Hematology and Oncology

## 2018-06-08 ENCOUNTER — Encounter
Admission: RE | Admit: 2018-06-08 | Discharge: 2018-06-08 | Disposition: A | Discharge status: Home / Self Care | Payer: Medicare Other | Source: Ambulatory Visit | Attending: Internal Medicine | Admitting: Internal Medicine

## 2018-06-08 DIAGNOSIS — Z9989 Dependence on other enabling machines and devices: Secondary | ICD-10-CM | POA: Diagnosis not present

## 2018-06-08 DIAGNOSIS — S8012XA Contusion of left lower leg, initial encounter: Secondary | ICD-10-CM | POA: Diagnosis not present

## 2018-06-08 DIAGNOSIS — K2970 Gastritis, unspecified, without bleeding: Secondary | ICD-10-CM | POA: Diagnosis not present

## 2018-06-08 DIAGNOSIS — E119 Type 2 diabetes mellitus without complications: Secondary | ICD-10-CM | POA: Diagnosis not present

## 2018-06-08 DIAGNOSIS — R609 Edema, unspecified: Secondary | ICD-10-CM | POA: Insufficient documentation

## 2018-06-08 DIAGNOSIS — E1122 Type 2 diabetes mellitus with diabetic chronic kidney disease: Secondary | ICD-10-CM | POA: Insufficient documentation

## 2018-06-08 DIAGNOSIS — K259 Gastric ulcer, unspecified as acute or chronic, without hemorrhage or perforation: Secondary | ICD-10-CM | POA: Diagnosis not present

## 2018-06-08 DIAGNOSIS — Z79899 Other long term (current) drug therapy: Secondary | ICD-10-CM | POA: Insufficient documentation

## 2018-06-08 DIAGNOSIS — D61818 Other pancytopenia: Secondary | ICD-10-CM | POA: Insufficient documentation

## 2018-06-08 DIAGNOSIS — Z9181 History of falling: Secondary | ICD-10-CM | POA: Diagnosis not present

## 2018-06-08 DIAGNOSIS — N189 Chronic kidney disease, unspecified: Secondary | ICD-10-CM | POA: Diagnosis not present

## 2018-06-08 DIAGNOSIS — N181 Chronic kidney disease, stage 1: Secondary | ICD-10-CM | POA: Diagnosis not present

## 2018-06-08 DIAGNOSIS — S8012XD Contusion of left lower leg, subsequent encounter: Secondary | ICD-10-CM | POA: Diagnosis not present

## 2018-06-08 DIAGNOSIS — Z7951 Long term (current) use of inhaled steroids: Secondary | ICD-10-CM | POA: Diagnosis not present

## 2018-06-08 DIAGNOSIS — D630 Anemia in neoplastic disease: Secondary | ICD-10-CM | POA: Diagnosis not present

## 2018-06-08 DIAGNOSIS — K219 Gastro-esophageal reflux disease without esophagitis: Secondary | ICD-10-CM | POA: Diagnosis not present

## 2018-06-08 DIAGNOSIS — I10 Essential (primary) hypertension: Secondary | ICD-10-CM | POA: Diagnosis not present

## 2018-06-08 DIAGNOSIS — M1990 Unspecified osteoarthritis, unspecified site: Secondary | ICD-10-CM | POA: Insufficient documentation

## 2018-06-08 DIAGNOSIS — I129 Hypertensive chronic kidney disease with stage 1 through stage 4 chronic kidney disease, or unspecified chronic kidney disease: Secondary | ICD-10-CM | POA: Insufficient documentation

## 2018-06-08 DIAGNOSIS — J4540 Moderate persistent asthma, uncomplicated: Secondary | ICD-10-CM | POA: Diagnosis not present

## 2018-06-08 DIAGNOSIS — J45909 Unspecified asthma, uncomplicated: Secondary | ICD-10-CM | POA: Diagnosis not present

## 2018-06-08 DIAGNOSIS — H409 Unspecified glaucoma: Secondary | ICD-10-CM | POA: Diagnosis not present

## 2018-06-08 DIAGNOSIS — R799 Abnormal finding of blood chemistry, unspecified: Secondary | ICD-10-CM | POA: Diagnosis present

## 2018-06-08 DIAGNOSIS — E0821 Diabetes mellitus due to underlying condition with diabetic nephropathy: Secondary | ICD-10-CM | POA: Diagnosis not present

## 2018-06-08 DIAGNOSIS — D013 Carcinoma in situ of anus and anal canal: Secondary | ICD-10-CM | POA: Diagnosis not present

## 2018-06-08 DIAGNOSIS — Z7984 Long term (current) use of oral hypoglycemic drugs: Secondary | ICD-10-CM | POA: Diagnosis not present

## 2018-06-08 DIAGNOSIS — R262 Difficulty in walking, not elsewhere classified: Secondary | ICD-10-CM | POA: Diagnosis not present

## 2018-06-08 DIAGNOSIS — R0602 Shortness of breath: Secondary | ICD-10-CM | POA: Diagnosis not present

## 2018-06-08 DIAGNOSIS — Z452 Encounter for adjustment and management of vascular access device: Secondary | ICD-10-CM | POA: Diagnosis not present

## 2018-06-08 DIAGNOSIS — R197 Diarrhea, unspecified: Secondary | ICD-10-CM | POA: Diagnosis not present

## 2018-06-08 DIAGNOSIS — Z85828 Personal history of other malignant neoplasm of skin: Secondary | ICD-10-CM | POA: Diagnosis not present

## 2018-06-08 DIAGNOSIS — D696 Thrombocytopenia, unspecified: Secondary | ICD-10-CM | POA: Diagnosis not present

## 2018-06-08 DIAGNOSIS — R279 Unspecified lack of coordination: Secondary | ICD-10-CM | POA: Diagnosis not present

## 2018-06-08 DIAGNOSIS — C92A0 Acute myeloid leukemia with multilineage dysplasia, not having achieved remission: Secondary | ICD-10-CM | POA: Diagnosis not present

## 2018-06-08 DIAGNOSIS — M6281 Muscle weakness (generalized): Secondary | ICD-10-CM | POA: Diagnosis not present

## 2018-06-08 DIAGNOSIS — D709 Neutropenia, unspecified: Secondary | ICD-10-CM | POA: Diagnosis not present

## 2018-06-08 DIAGNOSIS — R6889 Other general symptoms and signs: Secondary | ICD-10-CM | POA: Diagnosis not present

## 2018-06-08 DIAGNOSIS — Z9221 Personal history of antineoplastic chemotherapy: Secondary | ICD-10-CM | POA: Diagnosis not present

## 2018-06-08 DIAGNOSIS — S7002XD Contusion of left hip, subsequent encounter: Secondary | ICD-10-CM | POA: Diagnosis not present

## 2018-06-08 DIAGNOSIS — C9200 Acute myeloblastic leukemia, not having achieved remission: Secondary | ICD-10-CM | POA: Insufficient documentation

## 2018-06-08 DIAGNOSIS — C9290 Myeloid leukemia, unspecified, not having achieved remission: Secondary | ICD-10-CM | POA: Diagnosis not present

## 2018-06-08 DIAGNOSIS — J4520 Mild intermittent asthma, uncomplicated: Secondary | ICD-10-CM | POA: Diagnosis not present

## 2018-06-08 DIAGNOSIS — E43 Unspecified severe protein-calorie malnutrition: Secondary | ICD-10-CM | POA: Diagnosis not present

## 2018-06-08 DIAGNOSIS — R531 Weakness: Secondary | ICD-10-CM | POA: Diagnosis not present

## 2018-06-08 DIAGNOSIS — R233 Spontaneous ecchymoses: Secondary | ICD-10-CM | POA: Diagnosis not present

## 2018-06-08 DIAGNOSIS — K279 Peptic ulcer, site unspecified, unspecified as acute or chronic, without hemorrhage or perforation: Secondary | ICD-10-CM | POA: Diagnosis not present

## 2018-06-08 DIAGNOSIS — R5381 Other malaise: Secondary | ICD-10-CM | POA: Diagnosis not present

## 2018-06-08 DIAGNOSIS — E871 Hypo-osmolality and hyponatremia: Secondary | ICD-10-CM | POA: Diagnosis not present

## 2018-06-08 DIAGNOSIS — E785 Hyperlipidemia, unspecified: Secondary | ICD-10-CM | POA: Insufficient documentation

## 2018-06-08 DIAGNOSIS — R7989 Other specified abnormal findings of blood chemistry: Secondary | ICD-10-CM | POA: Diagnosis not present

## 2018-06-08 LAB — HEPATIC FUNCTION PANEL
ALBUMIN: 2.6 g/dL — ABNORMAL LOW (ref 3.5–5.0)
ALT (SGPT): 10 U/L — ABNORMAL LOW (ref 15–48)
BILIRUBIN DIRECT: 0.4 mg/dL (ref 0.00–0.40)
BILIRUBIN TOTAL: 2.8 mg/dL — ABNORMAL HIGH (ref 0.0–1.2)
PROTEIN TOTAL: 6.3 g/dL — ABNORMAL LOW (ref 6.5–8.3)

## 2018-06-08 LAB — MONOCYTES - REL (DIFF): Lab: 3

## 2018-06-08 LAB — CBC W/ AUTO DIFF
HEMATOCRIT: 24.9 % — ABNORMAL LOW (ref 36.0–46.0)
MEAN CORPUSCULAR HEMOGLOBIN CONC: 31 g/dL (ref 31.0–37.0)
MEAN CORPUSCULAR HEMOGLOBIN: 28.2 pg (ref 26.0–34.0)
MEAN CORPUSCULAR VOLUME: 90.9 fL (ref 80.0–100.0)
MEAN PLATELET VOLUME: 10.9 fL — ABNORMAL HIGH (ref 7.0–10.0)
NUCLEATED RED BLOOD CELLS: 140 /100{WBCs} — ABNORMAL HIGH (ref <=4)
PLATELET COUNT: 48 10*9/L — ABNORMAL LOW (ref 150–440)
WBC ADJUSTED: 7.3 10*9/L (ref 4.5–11.0)

## 2018-06-08 LAB — BASIC METABOLIC PANEL
ANION GAP: 6 mmol/L — ABNORMAL LOW (ref 7–15)
BLOOD UREA NITROGEN: 18 mg/dL (ref 7–21)
BUN / CREAT RATIO: 49
CALCIUM: 8.1 mg/dL — ABNORMAL LOW (ref 8.5–10.2)
CHLORIDE: 96 mmol/L — ABNORMAL LOW (ref 98–107)
CO2: 31 mmol/L — ABNORMAL HIGH (ref 22.0–30.0)
CREATININE: 0.37 mg/dL — ABNORMAL LOW (ref 0.60–1.00)
EGFR CKD-EPI AA FEMALE: >90 mL/min/{1.73_m2} (ref >=60)
EGFR CKD-EPI NON-AA FEMALE: >90 mL/min/{1.73_m2} (ref >=60)
GLUCOSE RANDOM: 137 mg/dL (ref 65–179)
POTASSIUM: 3.9 mmol/L (ref 3.5–5.0)
SODIUM: 133 mmol/L — ABNORMAL LOW (ref 135–145)

## 2018-06-08 LAB — MANUAL DIFFERENTIAL
BASOPHILS - ABS (DIFF): 0 10*9/L (ref 0.0–0.1)
BLASTS - REL (DIFF): 36 % (ref <=0)
EOSINOPHILS - ABS (DIFF): 0 10*9/L (ref 0.0–0.4)
EOSINOPHILS - REL (DIFF): 0 %
LYMPHOCYTES - ABS (DIFF): 3.8 10*9/L (ref 1.5–5.0)
LYMPHOCYTES - REL (DIFF): 52 %
MONOCYTES - ABS (DIFF): 0.2 10*9/L (ref 0.2–0.8)
MONOCYTES - REL (DIFF): 3 %
NEUTROPHILS - ABS (DIFF): 0.7 10*9/L — ABNORMAL LOW (ref 2.0–7.5)

## 2018-06-08 LAB — PHOSPHORUS: Phosphate:MCnc:Pt:Ser/Plas:Qn:: 3.5

## 2018-06-08 LAB — BILIRUBIN DIRECT: Bilirubin.glucuronidated:MCnc:Pt:Ser/Plas:Qn:: 0.4

## 2018-06-08 LAB — EGFR CKD-EPI NON-AA FEMALE: Lab: >90

## 2018-06-08 LAB — MEAN CORPUSCULAR HEMOGLOBIN CONC: Lab: 31

## 2018-06-08 LAB — MAGNESIUM: Magnesium:MCnc:Pt:Ser/Plas:Qn:: 1.6

## 2018-06-08 MED ORDER — LIDOCAINE 5 % TOPICAL PATCH
MEDICATED_PATCH | TRANSDERMAL | 0 refills | 0 days
Start: 2018-06-08 — End: 2018-07-08

## 2018-06-08 MED ORDER — CARVEDILOL 25 MG TABLET
ORAL_TABLET | Freq: Two times a day (BID) | ORAL | 0 refills | 0 days
Start: 2018-06-08 — End: 2018-07-08

## 2018-06-08 MED ORDER — ACETAMINOPHEN 325 MG TABLET
ORAL_TABLET | Freq: Four times a day (QID) | ORAL | 0 refills | 0 days | PRN
Start: 2018-06-08 — End: ?

## 2018-06-08 MED ORDER — AMLODIPINE 5 MG TABLET
ORAL_TABLET | Freq: Every day | ORAL | 3 refills | 0.00000 days
Start: 2018-06-08 — End: 2019-06-08

## 2018-06-08 MED ORDER — HYDROXYZINE HCL 25 MG TABLET
Freq: Four times a day (QID) | ORAL | 0 days | PRN
Start: 2018-06-08 — End: ?

## 2018-06-08 MED ORDER — OXYCODONE-ACETAMINOPHEN 5 MG-325 MG TABLET
ORAL_TABLET | Freq: Four times a day (QID) | ORAL | 0 refills | 0.00000 days | Status: CP | PRN
Start: 2018-06-08 — End: 2018-06-13

## 2018-06-08 MED ORDER — LISINOPRIL 10 MG TABLET
ORAL_TABLET | Freq: Every day | ORAL | 0 refills | 0.00000 days
Start: 2018-06-08 — End: 2018-07-08

## 2018-06-08 MED ORDER — TRANEXAMIC ACID 650 MG TABLET
Freq: Two times a day (BID) | ORAL | 0 refills | 0.00000 days
Start: 2018-06-08 — End: ?

## 2018-06-08 MED ORDER — FOLIC ACID 1 MG TABLET
ORAL_TABLET | Freq: Every day | ORAL | 11 refills | 0 days
Start: 2018-06-08 — End: 2019-06-08

## 2018-06-08 NOTE — Unmapped (Signed)
Patient vital signs stable through shift. Anticipating possible discharge tomorrow per case management note. No new complaints at this time. Will continue to monitor.     Problem: Adult Inpatient Plan of Care  Goal: Plan of Care Review  Outcome: Ongoing - Unchanged     Problem: Self-Care Deficit  Goal: Improved Ability to Complete Activities of Daily Living  Outcome: Ongoing - Unchanged     Problem: Fall Injury Risk  Goal: Absence of Fall and Fall-Related Injury  Outcome: Ongoing - Unchanged     Problem: Diabetes Comorbidity  Goal: Blood Glucose Level Within Desired Range  Outcome: Ongoing - Unchanged     Problem: Hypertension Comorbidity  Goal: Blood Pressure in Desired Range  Outcome: Ongoing - Unchanged     Problem: Skin Injury Risk Increased  Goal: Skin Health and Integrity  Outcome: Ongoing - Unchanged

## 2018-06-08 NOTE — Unmapped (Signed)
Pt alert and oriented x4 but often forgetful. Pt afebrile with baseline hypertension, VS otherwise stable. Pt with complaints of intermittent L hip pain but refuses any PRN pain medications. Pt with complaints of a lot of anxiety and difficulty sleeping, I am so anxious I want to scream. States she feels anxious because she is supposed to be leaving today for a new SNF. Given PRN atarax and PRN trazodone with little relief. Pt awake most of night. Using call bell frequently throughout night. Pt with several episodes of urine incontinence, attempted purewick but pt moves/pulls purewick out. No BM this shift. Mepilex applied to sacrum. Continues Q2 turns but only turning to left side. Save both arms, BPs taken on R leg. Fall precautions and pt safety maintained. Bed alarm on. Will continue to monitor.     Problem: Adult Inpatient Plan of Care  Goal: Plan of Care Review  Outcome: Ongoing - Unchanged     Problem: Self-Care Deficit  Goal: Improved Ability to Complete Activities of Daily Living  Outcome: Ongoing - Unchanged     Problem: Fall Injury Risk  Goal: Absence of Fall and Fall-Related Injury  Outcome: Ongoing - Unchanged     Problem: Diabetes Comorbidity  Goal: Blood Glucose Level Within Desired Range  Outcome: Ongoing - Unchanged     Problem: Hypertension Comorbidity  Goal: Blood Pressure in Desired Range  Outcome: Ongoing - Unchanged     Problem: Skin Injury Risk Increased  Goal: Skin Health and Integrity  Outcome: Ongoing - Unchanged

## 2018-06-09 ENCOUNTER — Inpatient Hospital Stay: Payer: Medicare Other

## 2018-06-09 ENCOUNTER — Other Ambulatory Visit
Admission: RE | Admit: 2018-06-09 | Discharge: 2018-06-09 | Disposition: A | Discharge status: Home / Self Care | Payer: Medicare Other | Source: Ambulatory Visit | Attending: Internal Medicine | Admitting: Internal Medicine

## 2018-06-09 DIAGNOSIS — D696 Thrombocytopenia, unspecified: Secondary | ICD-10-CM | POA: Diagnosis not present

## 2018-06-09 DIAGNOSIS — C9200 Acute myeloblastic leukemia, not having achieved remission: Secondary | ICD-10-CM | POA: Diagnosis not present

## 2018-06-09 DIAGNOSIS — E119 Type 2 diabetes mellitus without complications: Secondary | ICD-10-CM | POA: Diagnosis not present

## 2018-06-09 DIAGNOSIS — S8012XD Contusion of left lower leg, subsequent encounter: Secondary | ICD-10-CM | POA: Diagnosis not present

## 2018-06-09 DIAGNOSIS — E43 Unspecified severe protein-calorie malnutrition: Secondary | ICD-10-CM | POA: Diagnosis not present

## 2018-06-09 LAB — CBC WITH DIFFERENTIAL/PLATELET
Band Neutrophils: 0 %
Basophils Absolute: 0 10*3/uL (ref 0–0.1)
Basophils Relative: 0 %
Blasts: 45 %
Eosinophils Absolute: 0 10*3/uL (ref 0–0.7)
Eosinophils Relative: 0 %
HCT: 25.2 % — ABNORMAL LOW (ref 35.0–47.0)
Hemoglobin: 8.5 g/dL — ABNORMAL LOW (ref 12.0–16.0)
Lymphocytes Relative: 38 %
Lymphs Abs: 2.9 10*3/uL (ref 1.0–3.6)
MCH: 29.8 pg (ref 26.0–34.0)
MCHC: 33.5 g/dL (ref 32.0–36.0)
MCV: 89 fL (ref 80.0–100.0)
Metamyelocytes Relative: 0 %
Monocytes Absolute: 0.5 10*3/uL (ref 0.2–0.9)
Monocytes Relative: 7 %
Myelocytes: 0 %
Neutro Abs: 0.8 10*3/uL — ABNORMAL LOW (ref 1.4–6.5)
Neutrophils Relative %: 10 %
Other: 0 %
Platelets: 46 10*3/uL — ABNORMAL LOW (ref 150–440)
Promyelocytes Relative: 0 %
RBC: 2.84 MIL/uL — ABNORMAL LOW (ref 3.80–5.20)
RDW: 17.8 % — ABNORMAL HIGH (ref 11.5–14.5)
WBC: 7.6 10*3/uL (ref 3.6–11.0)
nRBC: 151 /100 WBC — ABNORMAL HIGH

## 2018-06-09 MED ORDER — TRAZODONE HCL 50 MG PO TABS
50.00 | ORAL_TABLET | ORAL | Status: DC
Start: ? — End: 2018-06-09

## 2018-06-11 ENCOUNTER — Other Ambulatory Visit: Payer: Self-pay

## 2018-06-11 ENCOUNTER — Emergency Department
Admission: EM | Admit: 2018-06-11 | Discharge: 2018-06-11 | Payer: Medicare Other | Attending: Emergency Medicine | Admitting: Emergency Medicine

## 2018-06-11 ENCOUNTER — Inpatient Hospital Stay: Payer: Medicare Other

## 2018-06-11 ENCOUNTER — Other Ambulatory Visit
Admission: RE | Admit: 2018-06-11 | Discharge: 2018-06-11 | Disposition: A | Discharge status: Home / Self Care | Payer: Medicare Other | Source: Ambulatory Visit | Attending: Internal Medicine | Admitting: Internal Medicine

## 2018-06-11 ENCOUNTER — Telehealth: Payer: Self-pay | Admitting: *Deleted

## 2018-06-11 DIAGNOSIS — D013 Carcinoma in situ of anus and anal canal: Secondary | ICD-10-CM | POA: Insufficient documentation

## 2018-06-11 DIAGNOSIS — Z79899 Other long term (current) drug therapy: Secondary | ICD-10-CM | POA: Diagnosis not present

## 2018-06-11 DIAGNOSIS — J45909 Unspecified asthma, uncomplicated: Secondary | ICD-10-CM | POA: Insufficient documentation

## 2018-06-11 DIAGNOSIS — D696 Thrombocytopenia, unspecified: Secondary | ICD-10-CM

## 2018-06-11 DIAGNOSIS — N181 Chronic kidney disease, stage 1: Secondary | ICD-10-CM | POA: Insufficient documentation

## 2018-06-11 DIAGNOSIS — E1122 Type 2 diabetes mellitus with diabetic chronic kidney disease: Secondary | ICD-10-CM | POA: Diagnosis not present

## 2018-06-11 DIAGNOSIS — I129 Hypertensive chronic kidney disease with stage 1 through stage 4 chronic kidney disease, or unspecified chronic kidney disease: Secondary | ICD-10-CM | POA: Insufficient documentation

## 2018-06-11 DIAGNOSIS — Z7984 Long term (current) use of oral hypoglycemic drugs: Secondary | ICD-10-CM | POA: Insufficient documentation

## 2018-06-11 HISTORY — DX: Encounter for other specified aftercare: Z51.89

## 2018-06-11 LAB — CBC WITH DIFFERENTIAL/PLATELET
Band Neutrophils: 0 %
Band Neutrophils: 1 %
Basophils Absolute: 0 10*3/uL (ref 0–0.1)
Basophils Absolute: 0 10*3/uL (ref 0–0.1)
Basophils Relative: 0 %
Basophils Relative: 0 %
Blasts: 69 %
Blasts: 61 %
Eosinophils Absolute: 0 10*3/uL (ref 0–0.7)
Eosinophils Absolute: 0 10*3/uL (ref 0–0.7)
Eosinophils Relative: 0 %
Eosinophils Relative: 0 %
HCT: 22.9 % — ABNORMAL LOW (ref 35.0–47.0)
HCT: 23.7 % — ABNORMAL LOW (ref 35.0–47.0)
Hemoglobin: 7.7 g/dL — ABNORMAL LOW (ref 12.0–16.0)
Hemoglobin: 7.6 g/dL — ABNORMAL LOW (ref 12.0–16.0)
Lymphocytes Relative: 22 %
Lymphocytes Relative: 32 %
Lymphs Abs: 2.4 10*3/uL (ref 1.0–3.6)
Lymphs Abs: 4.4 10*3/uL — ABNORMAL HIGH (ref 1.0–3.6)
MCH: 30.4 pg (ref 26.0–34.0)
MCH: 28.7 pg (ref 26.0–34.0)
MCHC: 33.7 g/dL (ref 32.0–36.0)
MCHC: 32.1 g/dL (ref 32.0–36.0)
MCV: 90.2 fL (ref 80.0–100.0)
MCV: 89.4 fL (ref 80.0–100.0)
Metamyelocytes Relative: 0 %
Metamyelocytes Relative: 0 %
Monocytes Absolute: 0.3 10*3/uL (ref 0.2–0.9)
Monocytes Absolute: 0.6 10*3/uL (ref 0.2–0.9)
Monocytes Relative: 3 %
Monocytes Relative: 4 %
Myelocytes: 0 %
Myelocytes: 0 %
Neutro Abs: 0.6 10*3/uL — ABNORMAL LOW (ref 1.4–6.5)
Neutro Abs: 0.4 10*3/uL — ABNORMAL LOW (ref 1.4–6.5)
Neutrophils Relative %: 6 %
Neutrophils Relative %: 2 %
Other: 0 %
Other: 0 %
Platelets: 26 10*3/uL — CL (ref 150–440)
Platelets: 28 10*3/uL — CL (ref 150–440)
Promyelocytes Relative: 0 %
Promyelocytes Relative: 0 %
RBC: 2.54 MIL/uL — ABNORMAL LOW (ref 3.80–5.20)
RBC: 2.66 MIL/uL — ABNORMAL LOW (ref 3.80–5.20)
RDW: 18.6 % — ABNORMAL HIGH (ref 11.5–14.5)
RDW: 18.4 % — ABNORMAL HIGH (ref 11.5–14.5)
WBC: 10.8 10*3/uL (ref 3.6–11.0)
WBC: 13.8 10*3/uL — ABNORMAL HIGH (ref 3.6–11.0)
nRBC: 102 /100 WBC — ABNORMAL HIGH
nRBC: 92 /100 WBC — ABNORMAL HIGH

## 2018-06-11 LAB — COMPREHENSIVE METABOLIC PANEL
ALT: 10 U/L (ref 0–44)
AST: 24 U/L (ref 15–41)
Albumin: 2.5 g/dL — ABNORMAL LOW (ref 3.5–5.0)
Alkaline Phosphatase: 71 U/L (ref 38–126)
Anion gap: 6 (ref 5–15)
BUN: 16 mg/dL (ref 8–23)
CO2: 28 mmol/L (ref 22–32)
Calcium: 8.2 mg/dL — ABNORMAL LOW (ref 8.9–10.3)
Chloride: 96 mmol/L — ABNORMAL LOW (ref 98–111)
Creatinine, Ser: 0.34 mg/dL — ABNORMAL LOW (ref 0.44–1.00)
GFR calc Af Amer: >60 mL/min (ref >60)
GFR calc non Af Amer: >60 mL/min (ref >60)
Glucose, Bld: 106 mg/dL — ABNORMAL HIGH (ref 70–99)
Potassium: 3.6 mmol/L (ref 3.5–5.1)
Sodium: 130 mmol/L — ABNORMAL LOW (ref 135–145)
Total Bilirubin: 2.6 mg/dL — ABNORMAL HIGH (ref 0.3–1.2)
Total Protein: 6.8 g/dL (ref 6.5–8.1)

## 2018-06-11 NOTE — Telephone Encounter (Signed)
I called and had to leave message on voice mail for Mr Andon to hold her oral chemotherapy until she sees Dr Bruna Potter on 10/29

## 2018-06-11 NOTE — Telephone Encounter (Signed)
Husband called and states that [facility] will not give and will not let him give her her oral chemotherapy pills because they do not have an order for her to have it. Please send an order so she can get it.

## 2018-06-11 NOTE — Telephone Encounter (Signed)
Called and spoke to [place], [place] regarding patient.  Labs were drawn this morning and plts are 26.  Hgb 8.5 and HCT 25.2.   Patient should come for her appointments next week Monday, Wednesday and Friday. Patient to arrive @ 8 AM.  Phone number for nurse is [phone] or [phone].

## 2018-06-11 NOTE — Telephone Encounter (Signed)
No orders. Hold until follow up at [facility] on 06/14/2018 per Dr. Bruna Potter.

## 2018-06-11 NOTE — ED Notes (Signed)
Pt's linens, clothing, diaper changed before transport.

## 2018-06-11 NOTE — ED Provider Notes (Signed)
[facility] Emergency Department Provider Note   ____________________________________________    I have reviewed the triage vital signs and the nursing notes.   HISTORY  Chief Complaint Abnormal Lab     HPI Julie Holland is a 77 y.o. female who was sent to the emergency department because of reported low platelets.  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  Review of records demonstrates the patient had a large gluteal hematoma 1 month ago, because of that reportedly her treatments have been stopped.  She sees an oncologist at [facility] and [place], Dr. Mike Gip.  Patient reports she feels quite well and has no complaints.   Past Medical History:  Diagnosis Date  . Arthritis   . Asthma   . Blood transfusion without reported diagnosis   . 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  . Cataract   . Chronic kidney disease    stage 1  . Diabetes mellitus 2007   type II  . Dry eye of right side   . GERD (gastroesophageal reflux disease)    OCC  . Glaucoma 2018   RIGHT EYE   . Hemorrhoids   . Hyperlipidemia   . Hypertension     Patient Active Problem List   Diagnosis Date Noted  . Edema of upper extremity 05/25/2018  . Edema of lower extremity 05/25/2018  . Encounter for antineoplastic chemotherapy 05/24/2018  . Left arm swelling 05/21/2018  . Other neutropenia ([place]) 05/14/2018  . Elevated bilirubin 05/14/2018  . Hypomagnesemia 05/07/2018  . Acute myeloid leukemia not having achieved remission ([place]) 04/30/2018  . Chemotherapy-induced neutropenia ([place]) 04/30/2018  . 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 MSSA bacteremia 03/08/2018  . Abscess of right hand 03/08/2018  . PUD (peptic ulcer disease)   . GI bleed 02/18/2018  . Cellulitis 02/10/2018  . Need for vaccination with 13-polyvalent pneumococcal conjugate vaccine 12/10/2017  .  Urine test positive for microalbuminuria 12/10/2017  . Anemia 05/02/2017  . Anemia associated with acute blood loss 03/09/2017  . Arthritis of left knee 12/10/2016  . Thrombocytopenia ([place]) 10/20/2016  . Postoperative hemorrhage involving digestive system following digestive system procedure 08/04/2016  . Rectal bleeding 07/31/2016  . Cancer of anal canal ([place]) 07/29/2016  . 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 Asthma 08/17/2015  . Umbilical hernia without obstruction and without gangrene 02/27/2015  . 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 Routine general medical examination at a health care facility 03/15/2013  . 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 Shoulder pain 11/23/2012  . Right bundle branch block 11/11/2012  . Diabetes mellitus, controlled ([place])   . Hyperlipidemia   . Hypertension     Past Surgical History:  Procedure Laterality Date  . CATARACT EXTRACTION, BILATERAL    . CHOLECYSTECTOMY    . COLONOSCOPY  02/13/13   Dr Bary Castilla  . EPIGASTRIC HERNIA REPAIR N/A 03/20/2015   Procedure: HERNIA REPAIR EPIGASTRIC ADULT;  Surgeon:  Bellow, MD;  Location: [facility] ORS;  Service: General;  Laterality: N/A;  . ESOPHAGOGASTRODUODENOSCOPY N/A 02/19/2018   Procedure: ESOPHAGOGASTRODUODENOSCOPY (EGD);  Surgeon: Lin Landsman, MD;  Location: [facility] ENDOSCOPY;  Service: Gastroenterology;  Laterality: N/A;  . HEMORRHOID SURGERY N/A 03/20/2015    FOCAL HIGH-GRADE SQUAMOUS INTRAEPITHELIAL LESION (HSIL, ANAL /HEMORRHOIDECTOMY;    Bellow, MD [facility] ORS;  : General;  Laterality: N/A;  . HERNIA REPAIR  July  2016   Epigastric hernia, primary repair  . INCISION AND DRAINAGE Right 02/11/2018   Procedure: INCISION AND DRAINAGE- RIGHT HAND;  Surgeon: Hessie Knows, MD;  Location: [facility] ORS;  Service: Orthopedics;  Laterality: Right;  . TONSILLECTOMY  age 83  . TOTAL ABDOMINAL HYSTERECTOMY  01/1989  . TUBAL LIGATION    . 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  Case has been presented at the [facility] tumor board. No indication for additional treatment outside of serial exams  Surgeon:  Bellow, MD;  Location: [facility] ORS;  Service: General;  Laterality: N/A;    Prior to Admission medications   Medication Sig Start Date End Date Taking? Authorizing Provider  ACCU-CHEK SMARTVIEW test strip USE AS INSTRUCTED TO CHECK  BLOOD GLUCOSE ONCE DAILY if desired; LON 99 months; Dx E11.9 12/17/17   [name], MD  acetaminophen (TYLENOL) 500 MG tablet Take 500-1,000 mg by mouth every 6 (six) hours as needed (for pain).    [provider]  ADVAIR DISKUS 100-50 MCG/DOSE AEPB USE 1 PUFF TWO TIMES DAILY Patient not taking: Reported on 05/24/2018 05/29/17   Roselee Nova, MD  albuterol (PROVENTIL HFA;VENTOLIN HFA) 108 (90 Base) MCG/ACT inhaler Inhale 2 puffs into the lungs every 6 (six) hours as needed for wheezing or shortness of breath. Patient not taking: Reported on 05/24/2018 10/02/17   Roselee Nova, MD  Carboxymeth-Glycerin-Polysorb (REFRESH OPTIVE ADVANCED OP) Place 1 drop into both eyes 3 (three) times daily as needed (for dry/irritated eyes).    [provider]  carvedilol (COREG) 6.25 MG tablet Take 1 tablet (6.25 mg total) by mouth 2 (two) times daily with a meal. 11/26/17   Lada, Satira Anis, MD  latanoprost (XALATAN) 0.005 % ophthalmic solution Place 1 drop at bedtime into the right eye.    [provider]  lisinopril (PRINIVIL,ZESTRIL) 10 MG tablet TAKE 1 TABLET BY MOUTH  DAILY 04/13/18   Lada, Satira Anis, MD  metFORMIN (GLUCOPHAGE) 850 MG tablet TAKE 1 TABLET BY MOUTH TWO  TIMES DAILY WITH A MEAL 06/01/18   Lada, Satira Anis, MD  Multiple Vitamin (MULTIVITAMIN) tablet Take 2 tablets by mouth daily.     [provider]  Nutritional Supplements (ESTROVEN PO) Take 1 tablet by mouth daily.    [provider]  pantoprazole (PROTONIX)  40 MG tablet Take 1 tablet (40 mg total) by mouth 2 (two) times daily before a meal. 02/20/18   Salary, Holly Bodily D, MD  simvastatin (ZOCOR) 20 MG tablet Take 1 tablet (20 mg total) by mouth at bedtime. 03/01/18   Lada, Satira Anis, MD  VENCLEXTA 100 MG TABS Take 100 mg by mouth daily. 05/24/18   [provider]     Allergies Hydrocodone  Family History  Problem Relation Age of Onset  . Bladder Cancer Mother   . Colon cancer Father   . Lung cancer Brother   . Lymphoma Sister   . Breast cancer Neg Hx     Social History Social History   Tobacco Use  . Smoking status: Never Smoker  . Smokeless tobacco: Never Used  . Tobacco comment: smoking cessation materials not required  Substance Use Topics  . Alcohol use: No  . Drug use: No    Review of Systems  Constitutional: No fever/chills Eyes: No visual changes.  ENT: No neck pain Cardiovascular: Denies chest pain. Respiratory: Denies shortness of breath.  Gastrointestinal: No abdominal pain.   Genitourinary: Negative for dysuria. Musculoskeletal: No injuries or swelling Skin: Negative for rash. Neurological: Negative for headaches    ____________________________________________   PHYSICAL EXAM:  VITAL SIGNS: ED Triage Vitals  Enc Vitals Group     BP 06/11/18 1736 (!) 155/76     Pulse Rate 06/11/18 1736 85     Resp 06/11/18 1736 14     Temp 06/11/18 1736 98 F (36.7 C)     Temp Source 06/11/18 1736 Oral     SpO2 06/11/18 1736 97 %     Weight 06/11/18 1736 57 kg (125 lb 10.6 oz)     Height 06/11/18 1736 1.6 m (5\' 3" )     Head Circumference --      Peak Flow --      Pain Score 06/11/18 1735 0     Pain Loc --      Pain Edu? --      Excl. in [place]? --     Constitutional: Alert and oriented. No acute distress. Pleasant and interactive   Head: Atraumatic.  Mouth/Throat: Mucous membranes are moist.   Neck:  Painless ROM Cardiovascular: Normal rate, regular rhythm. Grossly normal heart sounds.  Good peripheral  circulation. Respiratory: Normal respiratory effort.  No retractions. Lungs CTAB. Gastrointestinal: Soft and nontender. No distention.  No CVA tenderness.  Musculoskeletal: No hematomas.  Warm and well perfused Neurologic:  Normal speech and language. No gross focal neurologic deficits are appreciated.  Skin:  Skin is warm, dry and intact. No rash noted. Psychiatric: Mood and affect are normal. Speech and behavior are normal.  ____________________________________________   LABS (all labs ordered are listed, but only abnormal results are displayed)  Labs Reviewed  CBC WITH DIFFERENTIAL/PLATELET - Abnormal; Notable for the following components:      Result Value   WBC 13.8 (*)    RBC 2.66 (*)    Hemoglobin 7.6 (*)    HCT 23.7 (*)    RDW 18.4 (*)    Platelets 28 (*)    nRBC 92 (*)    Neutro Abs 0.4 (*)    Lymphs Abs 4.4 (*)    All other components within normal limits  COMPREHENSIVE METABOLIC PANEL - Abnormal; Notable for the following components:   Sodium 130 (*)    Chloride 96 (*)    Glucose, Bld 106 (*)    Creatinine, Ser 0.34 (*)    Calcium 8.2 (*)    Albumin 2.5 (*)    Total Bilirubin 2.6 (*)    All other components within normal limits  TYPE AND SCREEN   ____________________________________________  EKG  None ____________________________________________  RADIOLOGY  None ____________________________________________   PROCEDURES  Procedure(s) performed: No  Procedures   Critical Care performed: No ____________________________________________   INITIAL IMPRESSION / ASSESSMENT AND PLAN / ED COURSE  Pertinent labs & imaging results that were available during my care of the patient were reviewed by me and considered in my medical decision making (see chart for details).  Patient presents to the emergency department, history of AML, reports of low platelets.  We will recheck platelets here, discuss with oncology  It is unclear who referred her to the  emergency department.  States she feels well.  Discussed with Dr. Grayland Ormond of oncology, reports no need for admission for hemoglobin or platelets given the patient is entirely asymptomatic.  Patient has follow-up on Monday morning, she is quite comfortable with going home    ____________________________________________   FINAL CLINICAL  IMPRESSION(S) / ED DIAGNOSES  Final diagnoses:  Thrombocytopenia ([place])        Note:  This document was prepared using Dragon voice recognition software and may include unintentional dictation errors.    Lavonia Drafts, MD 06/11/18 Kathyrn Drown

## 2018-06-11 NOTE — ED Notes (Signed)
ED Provider at bedside. 

## 2018-06-11 NOTE — ED Notes (Signed)
Pt discharged home after verbalizing understanding of discharge instructions; nad noted.  Pt via ems to [employer] [facility]

## 2018-06-11 NOTE — ED Notes (Signed)
Date and time results received: 06/11/18 6:27 PM  Name of Provider Notified: Dr. Lavonia Drafts Orders Received? Or Actions Taken?: platelet 28

## 2018-06-11 NOTE — ED Triage Notes (Signed)
Pt to ER via [facility] from [facility] for low platelet count. VSS with EMS. PICC line to R upper arm in place. Pt alert and oriented X4, active, cooperative, pt in NAD. RR even and unlabored, color WNL.

## 2018-06-11 NOTE — ED Notes (Signed)
[facility] called for transport to facility

## 2018-06-12 ENCOUNTER — Other Ambulatory Visit: Payer: Self-pay | Admitting: Urgent Care

## 2018-06-12 DIAGNOSIS — D696 Thrombocytopenia, unspecified: Secondary | ICD-10-CM

## 2018-06-12 DIAGNOSIS — C9200 Acute myeloblastic leukemia, not having achieved remission: Secondary | ICD-10-CM

## 2018-06-12 DIAGNOSIS — D630 Anemia in neoplastic disease: Secondary | ICD-10-CM

## 2018-06-12 LAB — PREPARE RBC (CROSSMATCH)

## 2018-06-12 NOTE — Progress Notes (Signed)
Re: Anticipated transfusion needs  Patient to ED on 06/11/2018 from [facility] due to thrombocytopenia. Notes reviewed. PICC line in place. Patient with no complaints. No active bleeding. Assessment findings were unremarkable, other than expected lab abnormalities associated with patient's known AML. Labs obtained as follows:  Lab Results  Component Value Date   HGB 7.6 (L) 06/11/2018   HCT 23.7 (L) 06/11/2018   PLT 28 (LL) 06/11/2018   Patient was discussed with on call provider Grayland Ormond, MD) by EDP Corky Downs, MD). Given that patient had no complains, stable VS, and an essentially negative exam, it was decided that patient was ok to discharge back to [place]. Patient to follow up in the [facility] on 06/14/2018.  Plans: 1. Labs reviewed. Anticipate transfusion needs:  Hemoglobin has decreased to 7.6. Orders placed, and released, for 1 unit of irridiated PRBCs to be transfused on 06/14/2017.  Platelets 28,000. [facility] wanting to keep patient > 30,000. Orders placed, and released. for 1 unit of irridiated platelets to be transfused on 06/14/2018.  Honor Loh, MSN, APRN, FNP-C, CEN Oncology/Hematology Nurse Practitioner  [facility] 06/12/18, 6:55 PM

## 2018-06-14 ENCOUNTER — Inpatient Hospital Stay: Payer: Medicare Other

## 2018-06-14 ENCOUNTER — Other Ambulatory Visit: Payer: Self-pay | Admitting: Internal Medicine

## 2018-06-14 ENCOUNTER — Other Ambulatory Visit: Payer: Self-pay

## 2018-06-14 ENCOUNTER — Emergency Department
Admission: EM | Admit: 2018-06-14 | Discharge: 2018-06-14 | Disposition: A | Discharge status: Home / Self Care | Payer: Medicare Other | Attending: Emergency Medicine | Admitting: Emergency Medicine

## 2018-06-14 ENCOUNTER — Telehealth: Payer: Self-pay | Admitting: *Deleted

## 2018-06-14 ENCOUNTER — Telehealth: Payer: Self-pay

## 2018-06-14 ENCOUNTER — Other Ambulatory Visit
Admission: RE | Admit: 2018-06-14 | Discharge: 2018-06-14 | Disposition: A | Discharge status: Home / Self Care | Payer: Medicare Other | Source: Ambulatory Visit | Attending: Internal Medicine | Admitting: Internal Medicine

## 2018-06-14 DIAGNOSIS — E1122 Type 2 diabetes mellitus with diabetic chronic kidney disease: Secondary | ICD-10-CM | POA: Insufficient documentation

## 2018-06-14 DIAGNOSIS — Z7984 Long term (current) use of oral hypoglycemic drugs: Secondary | ICD-10-CM | POA: Diagnosis not present

## 2018-06-14 DIAGNOSIS — R0602 Shortness of breath: Secondary | ICD-10-CM | POA: Diagnosis not present

## 2018-06-14 DIAGNOSIS — J45909 Unspecified asthma, uncomplicated: Secondary | ICD-10-CM | POA: Diagnosis not present

## 2018-06-14 DIAGNOSIS — D696 Thrombocytopenia, unspecified: Secondary | ICD-10-CM | POA: Diagnosis not present

## 2018-06-14 DIAGNOSIS — Z79899 Other long term (current) drug therapy: Secondary | ICD-10-CM | POA: Diagnosis not present

## 2018-06-14 DIAGNOSIS — I129 Hypertensive chronic kidney disease with stage 1 through stage 4 chronic kidney disease, or unspecified chronic kidney disease: Secondary | ICD-10-CM | POA: Diagnosis not present

## 2018-06-14 DIAGNOSIS — Z85828 Personal history of other malignant neoplasm of skin: Secondary | ICD-10-CM | POA: Diagnosis not present

## 2018-06-14 DIAGNOSIS — N181 Chronic kidney disease, stage 1: Secondary | ICD-10-CM | POA: Insufficient documentation

## 2018-06-14 LAB — BASIC METABOLIC PANEL
Anion gap: 11 (ref 5–15)
BUN: 11 mg/dL (ref 8–23)
CO2: 27 mmol/L (ref 22–32)
Calcium: 8.4 mg/dL — ABNORMAL LOW (ref 8.9–10.3)
Chloride: 95 mmol/L — ABNORMAL LOW (ref 98–111)
Creatinine, Ser: 0.38 mg/dL — ABNORMAL LOW (ref 0.44–1.00)
GFR calc Af Amer: >60 mL/min (ref >60)
GFR calc non Af Amer: >60 mL/min (ref >60)
Glucose, Bld: 94 mg/dL (ref 70–99)
Potassium: 3.3 mmol/L — ABNORMAL LOW (ref 3.5–5.1)
Sodium: 133 mmol/L — ABNORMAL LOW (ref 135–145)

## 2018-06-14 LAB — BPAM PLATELET PHERESIS
Blood Product Expiration Date: 201910082359
Unit Type and Rh: 7300

## 2018-06-14 LAB — CBC
HCT: 31.1 % — ABNORMAL LOW (ref 35.0–47.0)
Hemoglobin: 9.9 g/dL — ABNORMAL LOW (ref 12.0–16.0)
MCH: 29.6 pg (ref 26.0–34.0)
MCHC: 32 g/dL (ref 32.0–36.0)
MCV: 92.6 fL (ref 80.0–100.0)
Platelets: 18 10*3/uL — CL (ref 150–440)
RBC: 3.36 MIL/uL — ABNORMAL LOW (ref 3.80–5.20)
RDW: 20.1 % — ABNORMAL HIGH (ref 11.5–14.5)
WBC: 23.1 10*3/uL — ABNORMAL HIGH (ref 3.6–11.0)

## 2018-06-14 LAB — CBC WITH DIFFERENTIAL/PLATELET
Band Neutrophils: 0 %
Basophils Absolute: 0 10*3/uL (ref 0–0.1)
Basophils Relative: 0 %
Blasts: 80 %
Eosinophils Absolute: 0 10*3/uL (ref 0–0.7)
Eosinophils Relative: 0 %
HCT: 26.2 % — ABNORMAL LOW (ref 35.0–47.0)
Hemoglobin: 8.4 g/dL — ABNORMAL LOW (ref 12.0–16.0)
Lymphocytes Relative: 11 %
Lymphs Abs: 1.7 10*3/uL (ref 1.0–3.6)
MCH: 29.7 pg (ref 26.0–34.0)
MCHC: 32.2 g/dL (ref 32.0–36.0)
MCV: 92.2 fL (ref 80.0–100.0)
Metamyelocytes Relative: 0 %
Monocytes Absolute: 0.5 10*3/uL (ref 0.2–0.9)
Monocytes Relative: 3 %
Myelocytes: 0 %
Neutro Abs: 0.9 10*3/uL — ABNORMAL LOW (ref 1.4–6.5)
Neutrophils Relative %: 6 %
Other: 0 %
Platelets: 17 10*3/uL — CL (ref 150–440)
Promyelocytes Relative: 0 %
RBC: 2.84 MIL/uL — ABNORMAL LOW (ref 3.80–5.20)
RDW: 19.5 % — ABNORMAL HIGH (ref 11.5–14.5)
WBC: 15.4 10*3/uL — ABNORMAL HIGH (ref 3.6–11.0)
nRBC: 118 /100 WBC — ABNORMAL HIGH

## 2018-06-14 LAB — COMPREHENSIVE METABOLIC PANEL
ALT: 14 U/L (ref 0–44)
AST: 37 U/L (ref 15–41)
Albumin: 2.8 g/dL — ABNORMAL LOW (ref 3.5–5.0)
Alkaline Phosphatase: 81 U/L (ref 38–126)
Anion gap: 11 (ref 5–15)
BUN: 14 mg/dL (ref 8–23)
CO2: 27 mmol/L (ref 22–32)
Calcium: 8.5 mg/dL — ABNORMAL LOW (ref 8.9–10.3)
Chloride: 95 mmol/L — ABNORMAL LOW (ref 98–111)
Creatinine, Ser: 0.34 mg/dL — ABNORMAL LOW (ref 0.44–1.00)
GFR calc Af Amer: >60 mL/min (ref >60)
GFR calc non Af Amer: >60 mL/min (ref >60)
Glucose, Bld: 120 mg/dL — ABNORMAL HIGH (ref 70–99)
Potassium: 3.6 mmol/L (ref 3.5–5.1)
Sodium: 133 mmol/L — ABNORMAL LOW (ref 135–145)
Total Bilirubin: 3.2 mg/dL — ABNORMAL HIGH (ref 0.3–1.2)
Total Protein: 7.8 g/dL (ref 6.5–8.1)

## 2018-06-14 LAB — PREPARE PLATELET PHERESIS: Unit division: 0

## 2018-06-14 LAB — SAMPLE TO BLOOD BANK

## 2018-06-14 LAB — PLATELET COUNT: Platelets: 46 10*3/uL — ABNORMAL LOW (ref 150–440)

## 2018-06-14 MED ORDER — SODIUM CHLORIDE 0.9 % IV SOLN
10.0000 mL/h | Freq: Once | INTRAVENOUS | Status: AC
  Administered 2018-06-14: 10 mL/h via INTRAVENOUS

## 2018-06-14 NOTE — Unmapped (Signed)
[facility] Triage Note     Patient: Tracey Lang     Reason for call: Follow-up platelet count today = 17    Time call returned: 14:13     Phone Assessment: Spoke with Carlena Sax from    [facility] she states that the patient had a platelet count of 30 on Friday and today of 17. Patient was seen for her low platelets on Friday at a local ED and instructed to follow-up with oncology.    Triage Recommendations: When speaking with Carlena Sax from the [facility] place, I requested that she make the local oncologist aware of the patients platelets (Dr.Corcoran).     Patient Response: N/A     Outstanding tasks: Notification     Patient Pharmacy has been verified and primary pharmacy has been marked as preferred

## 2018-06-14 NOTE — Unmapped (Signed)
Carleene Cooper with [employer]	[place] contacted the Communication Center regarding the following:    - stating that she has some concerns about the patient's platelet count being 17.  Patient was transported to [facility] this weekend but nothing was done about her platelet count, they stated that she should follow up with oncology.    Please contact Carlena Sax at [phone].    Thanks in advance,    Tylene Fantasia  [facility]   [phone]

## 2018-06-14 NOTE — ED Provider Notes (Signed)
[facility] Emergency Department Provider Note  Time seen: 4:26 PM  I have reviewed the triage vital signs and the nursing notes.   HISTORY  Chief Complaint Abnormal Lab    HPI Julie Holland is a 77 y.o. female with a past medical history of CKD, diabetes, gastric reflux, hypertension, hyperlipidemia, AML, presents to the emergency department for low platelets.  According to the patient she was seen over the weekend for low platelets as well however states nothing was done and she was ultimately discharged home.  Patient followed up this morning for blood work and was called this afternoon saying that her platelet count was 17,000 and she needed to go to the emergency department.  Patient states she feels fine she has no symptoms.  She has no complaints.   Past Medical History:  Diagnosis Date  . Arthritis   . Asthma   . Blood transfusion without reported diagnosis   . Cancer ([place]) 03/2015   In situ carcinoma of the perianal skin, incidental finding at hemorrhoidectomy.  . Cataract   . Chronic kidney disease    stage 1  . Diabetes mellitus 2007   type II  . Dry eye of right side   . GERD (gastroesophageal reflux disease)    OCC  . Glaucoma 2018   RIGHT EYE   . Hemorrhoids   . Hyperlipidemia   . Hypertension     Patient Active Problem List   Diagnosis Date Noted  . Edema of upper extremity 05/25/2018  . Edema of lower extremity 05/25/2018  . Encounter for antineoplastic chemotherapy 05/24/2018  . Left arm swelling 05/21/2018  . Other neutropenia ([place]) 05/14/2018  . Elevated bilirubin 05/14/2018  . Hypomagnesemia 05/07/2018  . 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 Chemotherapy-induced neutropenia ([place]) 04/30/2018  . 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 MSSA bacteremia 03/08/2018  . Abscess of right hand 03/08/2018  . PUD (peptic ulcer disease)   . GI bleed 02/18/2018  . Cellulitis 02/10/2018  . Need for  vaccination with 13-polyvalent pneumococcal conjugate vaccine 12/10/2017  . Urine test positive for microalbuminuria 12/10/2017  . Anemia 05/02/2017  . Anemia associated with acute blood loss 03/09/2017  . Arthritis of left knee 12/10/2016  . Thrombocytopenia ([place]) 10/20/2016  . Postoperative hemorrhage involving digestive system following digestive system procedure 08/04/2016  . Rectal bleeding 07/31/2016  . Cancer of anal canal ([place]) 07/29/2016  . 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 Asthma 08/17/2015  . Umbilical hernia without obstruction and without gangrene 02/27/2015  . 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 Routine general medical examination at a health care facility 03/15/2013  . 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 Shoulder pain 11/23/2012  . Right bundle branch block 11/11/2012  . Diabetes mellitus, controlled ([place])   . Hyperlipidemia   . Hypertension     Past Surgical History:  Procedure Laterality Date  . CATARACT EXTRACTION, BILATERAL    . CHOLECYSTECTOMY    . COLONOSCOPY  02/13/13   Dr Bary Castilla  . EPIGASTRIC HERNIA REPAIR N/A 03/20/2015   Procedure: HERNIA REPAIR EPIGASTRIC ADULT;  Surgeon: Robert Bellow, MD;  Location: [facility] ORS;  Service: General;  Laterality: N/A;  . ESOPHAGOGASTRODUODENOSCOPY N/A 02/19/2018   Procedure: ESOPHAGOGASTRODUODENOSCOPY (EGD);  Surgeon: Lin Landsman, MD;  Location: [facility] ENDOSCOPY;  Service: Gastroenterology;  Laterality: N/A;  . HEMORRHOID SURGERY N/A 03/20/2015    FOCAL HIGH-GRADE SQUAMOUS INTRAEPITHELIAL LESION (HSIL, ANAL /HEMORRHOIDECTOMY;   Robert Bellow, MD  [facility] ORS;  : General;  Laterality: N/A;  . HERNIA REPAIR  July 2016   Epigastric hernia, primary repair  . INCISION AND DRAINAGE Right 02/11/2018   Procedure: INCISION AND DRAINAGE- RIGHT HAND;  Surgeon: Hessie Knows, MD;  Location: [facility] ORS;  Service: Orthopedics;  Laterality: Right;  . TONSILLECTOMY  age 73  . TOTAL ABDOMINAL HYSTERECTOMY  01/1989   . TUBAL LIGATION    . 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  Case has been presented at the [facility] tumor board. No indication for additional treatment outside of serial exams  Surgeon: Robert Bellow, MD;  Location: [facility] ORS;  Service: General;  Laterality: N/A;    Prior to Admission medications   Medication Sig Start Date End Date Taking? Authorizing Provider  ACCU-CHEK SMARTVIEW test strip USE AS INSTRUCTED TO CHECK  BLOOD GLUCOSE ONCE DAILY if desired; LON 99 months; Dx E11.9 12/17/17   [name], MD  acetaminophen (TYLENOL) 500 MG tablet Take 500-1,000 mg by mouth every 6 (six) hours as needed (for pain).    [provider]  ADVAIR DISKUS 100-50 MCG/DOSE AEPB USE 1 PUFF TWO TIMES DAILY Patient not taking: Reported on 05/24/2018 05/29/17   Roselee Nova, MD  albuterol (PROVENTIL HFA;VENTOLIN HFA) 108 (90 Base) MCG/ACT inhaler Inhale 2 puffs into the lungs every 6 (six) hours as needed for wheezing or shortness of breath. Patient not taking: Reported on 05/24/2018 10/02/17   Roselee Nova, MD  Carboxymeth-Glycerin-Polysorb (REFRESH OPTIVE ADVANCED OP) Place 1 drop into both eyes 3 (three) times daily as needed (for dry/irritated eyes).    [provider]  carvedilol (COREG) 6.25 MG tablet Take 1 tablet (6.25 mg total) by mouth 2 (two) times daily with a meal. 11/26/17   Lada, Satira Anis, MD  latanoprost (XALATAN) 0.005 % ophthalmic solution Place 1 drop at bedtime into the right eye.    [provider]  lisinopril (PRINIVIL,ZESTRIL) 10 MG tablet TAKE 1 TABLET BY MOUTH  DAILY 04/13/18   Lada, Satira Anis, MD  metFORMIN (GLUCOPHAGE) 850 MG tablet TAKE 1 TABLET BY MOUTH TWO  TIMES DAILY WITH A MEAL 06/01/18   Lada, Satira Anis, MD  Multiple Vitamin (MULTIVITAMIN) tablet Take 2 tablets by mouth daily.     [provider]  Nutritional Supplements (ESTROVEN PO) Take 1  tablet by mouth daily.    [provider]  pantoprazole (PROTONIX) 40 MG tablet Take 1 tablet (40 mg total) by mouth 2 (two) times daily before a meal. 02/20/18   Salary, Holly Bodily D, MD  simvastatin (ZOCOR) 20 MG tablet Take 1 tablet (20 mg total) by mouth at bedtime. 03/01/18   Lada, Satira Anis, MD  VENCLEXTA 100 MG TABS Take 100 mg by mouth daily. 05/24/18   [provider]    Allergies  Allergen Reactions  . Hydrocodone Nausea And Vomiting and Other (See Comments)    Vertigo but patient takes hydrocodone/acetaminophen outpatient    Family History  Problem Relation Age of Onset  . Bladder Cancer Mother   . Colon cancer Father   . Lung cancer Brother   . Lymphoma Sister   . Breast cancer Neg Hx     Social History Social History   Tobacco Use  . Smoking status: Never Smoker  . Smokeless tobacco: Never Used  . Tobacco comment: smoking cessation materials not required  Substance Use Topics  .  Alcohol use: No  . Drug use: No    Review of Systems Constitutional: Negative for fever. Cardiovascular: Negative for chest pain. Respiratory: Mild shortness of breath over the weekend which is since resolved per patient. Gastrointestinal: Negative for abdominal pain Musculoskeletal: Negative for musculoskeletal complaints Skin: Negative for skin complaints  Neurological: Negative for headache All other ROS negative  ____________________________________________   PHYSICAL EXAM:  VITAL SIGNS: ED Triage Vitals [06/14/18 1615]  Enc Vitals Group     BP (!) 167/79     Pulse Rate 90     Resp 14     Temp 99 F (37.2 C)     Temp Source Oral     SpO2 94 %     Weight 125 lb (56.7 kg)     Height 5\' 5"  (1.651 m)     Head Circumference      Peak Flow      Pain Score 0     Pain Loc      Pain Edu?      Excl. in [place]?    Constitutional: Alert and oriented. Well appearing and in no distress. Eyes: Normal exam ENT   Head: Normocephalic and atraumatic.    Mouth/Throat: Mucous membranes are moist. Cardiovascular: Normal rate, regular rhythm. No murmur Respiratory: Normal respiratory effort without tachypnea nor retractions. Breath sounds are clear Gastrointestinal: Soft and nontender. No distention. Musculoskeletal: Nontender with normal range of motion in all extremities. Neurologic:  Normal speech and language. No gross focal neurologic deficits Skin: Patient has older bruising to left buttock and left leg as well as left upper extremity.  No new bruising/hematomas identified. Psychiatric: Mood and affect are normal.  ____________________________________________    EKG  EKG reviewed and interpreted by myself shows normal sinus rhythm 89 bpm with a narrow QRS, normal axis, normal intervals besides slight QTC prolongation, no concerning ST changes.  ____________________________________________   INITIAL IMPRESSION / ASSESSMENT AND PLAN / ED COURSE  Pertinent labs & imaging results that were available during my care of the patient were reviewed by me and considered in my medical decision making (see chart for details).  Patient presents to the emergency department for reported low platelets.  Patient denies any symptoms at this time.  States she is feeling well but was told to come to the emergency department for evaluation of a platelet count of 17,000.  We will recheck labs to ensure lately count was correct we will discuss with hematology/oncology for further recommendations.  I discussed the patient with oncology, they recommend keep the platelet count greater than 30,000, patient was supposed to come this morning for a platelet transfusion, irradiated platelets are available for the patient.  We will transfuse 1 unit of platelets in the emergency department check a 1 hour posttransfusion platelet count per oncology/hematology recommendations and likely discharge home as the patient is asymptomatic.  Patient has received a platelet  transfusion.  1 hour status post infusion repeat platelet count of 46,000.  Patient will be discharged home with hematology follow-up.  ____________________________________________   FINAL CLINICAL IMPRESSION(S) / ED DIAGNOSES  Thrombocytopenia    Harvest Dark, MD 06/14/18 2116

## 2018-06-14 NOTE — Telephone Encounter (Signed)
Call from [facility] where patient is now residing for the past week stating her platelet count is 17 today. She went to ER for low platelet Friday. I called Benjie Karvonen back and askedif she knew the patient had an appointment today for lab and transfusion and that she has appointments every M- W -F. She stated she was unaware of this and that she was admitted there a week ago form [facility] and no one had said anything about her appointments here. She is asking if patient is to wait until Wed for transfusion or do we need to have her transfused tomorrow.Please advise.

## 2018-06-14 NOTE — ED Triage Notes (Signed)
To ER via [facility] from [facility] for evaluation of low platelets. Pt has reported platelet count of 17. Hx of low platelets. Pt pale. VSS with EMS.

## 2018-06-14 NOTE — ED Notes (Signed)
Report called to Sherie Don at [phone].

## 2018-06-14 NOTE — Telephone Encounter (Signed)
Patient was also seen in the ED on 06/11/2018. She was asymptomatic. Dr. Grayland Ormond was consulted via phone. The EDP and oncologist made the decision to recheck labs and transfuse on Monday (today) if needed. She was sent back to the facility with an AVS that had a listing of all of her appointments.  Honor Loh, MSN, APRN, FNP-C, CEN Oncology/Hematology Nurse Practitioner  [facility] 06/14/18, 3:15 PM

## 2018-06-14 NOTE — Discharge Instructions (Addendum)
Please follow-up with your hematologist/oncologist by calling tomorrow morning.  Return to the emergency department for any symptoms personally concerning to yourself.

## 2018-06-14 NOTE — ED Notes (Signed)
Pt arrives with PICC line to R upper arm-with will not flush and does not have blood return.

## 2018-06-14 NOTE — Telephone Encounter (Signed)
Call to [facility]- [phone]  This writer spoke w nurse Jake Samples stated she did not know she had an appt in [place] today and weekly [facility] . Stated she knew platelets were low. Told to take pt to ER now and that orders for transfusion is already in the computer here. Stated she WILL have the pt taken to the ER. Honor Loh NP stated he put orders in this afternoon )  Benjie Karvonen acknowledged understanding . Doran Durand NP informed of this communication.

## 2018-06-14 NOTE — Telephone Encounter (Signed)
Contacted the patient regarding her appt for today; 06/14/2018.  Pt did not come for her infusion.  The patient states that she did not know she had an appt today.  Reviewed all upcoming appt dates/times with the patient.  She states understanding.

## 2018-06-14 NOTE — Telephone Encounter (Signed)
  The care facility was contacted directly by Nada Boozer, RN on Friday.  Appointments were communicated.  Patient needs to be transfused today.   Lequita Asal, MD

## 2018-06-15 LAB — PREPARE PLATELET PHERESIS: Unit division: 0

## 2018-06-15 LAB — TYPE AND SCREEN
ABO/RH(D): A POS
Antibody Screen: NEGATIVE
Unit division: 0

## 2018-06-15 LAB — BPAM PLATELET PHERESIS
Blood Product Expiration Date: 201910082359
ISSUE DATE / TIME: 201910071731
Unit Type and Rh: 7300

## 2018-06-15 LAB — BPAM RBC
Blood Product Expiration Date: 201910302359
Unit Type and Rh: 600

## 2018-06-16 ENCOUNTER — Other Ambulatory Visit
Admission: RE | Admit: 2018-06-16 | Discharge: 2018-06-16 | Disposition: A | Discharge status: Home / Self Care | Payer: Medicare Other | Source: Ambulatory Visit | Attending: Internal Medicine | Admitting: Internal Medicine

## 2018-06-16 ENCOUNTER — Other Ambulatory Visit: Payer: Self-pay

## 2018-06-16 ENCOUNTER — Inpatient Hospital Stay: Payer: Medicare Other

## 2018-06-16 DIAGNOSIS — C9200 Acute myeloblastic leukemia, not having achieved remission: Secondary | ICD-10-CM

## 2018-06-16 DIAGNOSIS — D61818 Other pancytopenia: Secondary | ICD-10-CM | POA: Diagnosis not present

## 2018-06-16 DIAGNOSIS — M1990 Unspecified osteoarthritis, unspecified site: Secondary | ICD-10-CM | POA: Diagnosis not present

## 2018-06-16 DIAGNOSIS — K2970 Gastritis, unspecified, without bleeding: Secondary | ICD-10-CM | POA: Diagnosis not present

## 2018-06-16 DIAGNOSIS — I129 Hypertensive chronic kidney disease with stage 1 through stage 4 chronic kidney disease, or unspecified chronic kidney disease: Secondary | ICD-10-CM | POA: Diagnosis not present

## 2018-06-16 DIAGNOSIS — Z7984 Long term (current) use of oral hypoglycemic drugs: Secondary | ICD-10-CM | POA: Diagnosis not present

## 2018-06-16 DIAGNOSIS — K219 Gastro-esophageal reflux disease without esophagitis: Secondary | ICD-10-CM | POA: Diagnosis not present

## 2018-06-16 DIAGNOSIS — N189 Chronic kidney disease, unspecified: Secondary | ICD-10-CM | POA: Diagnosis not present

## 2018-06-16 DIAGNOSIS — R531 Weakness: Secondary | ICD-10-CM | POA: Diagnosis not present

## 2018-06-16 DIAGNOSIS — K259 Gastric ulcer, unspecified as acute or chronic, without hemorrhage or perforation: Secondary | ICD-10-CM | POA: Diagnosis not present

## 2018-06-16 DIAGNOSIS — E785 Hyperlipidemia, unspecified: Secondary | ICD-10-CM | POA: Diagnosis not present

## 2018-06-16 DIAGNOSIS — D630 Anemia in neoplastic disease: Secondary | ICD-10-CM | POA: Insufficient documentation

## 2018-06-16 DIAGNOSIS — Z79899 Other long term (current) drug therapy: Secondary | ICD-10-CM | POA: Diagnosis not present

## 2018-06-16 DIAGNOSIS — J45909 Unspecified asthma, uncomplicated: Secondary | ICD-10-CM | POA: Diagnosis not present

## 2018-06-16 DIAGNOSIS — E1122 Type 2 diabetes mellitus with diabetic chronic kidney disease: Secondary | ICD-10-CM | POA: Diagnosis not present

## 2018-06-16 LAB — CBC WITH DIFFERENTIAL/PLATELET
Band Neutrophils: 2 %
Basophils Absolute: 0 10*3/uL (ref 0.0–0.1)
Basophils Absolute: 0 10*3/uL (ref 0.0–0.1)
Basophils Relative: 0 %
Basophils Relative: 0 %
Blasts: 61 %
Blasts: 76 %
Eosinophils Absolute: 0 10*3/uL (ref 0.0–0.5)
Eosinophils Absolute: 0 10*3/uL (ref 0.0–0.5)
Eosinophils Relative: 0 %
Eosinophils Relative: 0 %
HCT: 28.9 % — ABNORMAL LOW (ref 36.0–46.0)
HCT: 30.2 % — ABNORMAL LOW (ref 36.0–46.0)
Hemoglobin: 8.8 g/dL — ABNORMAL LOW (ref 12.0–15.0)
Hemoglobin: 8.9 g/dL — ABNORMAL LOW (ref 12.0–15.0)
Lymphocytes Relative: 13 %
Lymphocytes Relative: 18 %
Lymphs Abs: 3.3 10*3/uL (ref 0.7–4.0)
Lymphs Abs: 6 10*3/uL — ABNORMAL HIGH (ref 0.7–4.0)
MCH: 29.1 pg (ref 26.0–34.0)
MCH: 28.3 pg (ref 26.0–34.0)
MCHC: 30.4 g/dL (ref 30.0–36.0)
MCHC: 29.5 g/dL — ABNORMAL LOW (ref 30.0–36.0)
MCV: 95.7 fL (ref 80.0–100.0)
MCV: 96.2 fL (ref 80.0–100.0)
Metamyelocytes Relative: 6 %
Monocytes Absolute: 0.8 10*3/uL (ref 0.1–1.0)
Monocytes Absolute: 0.7 10*3/uL (ref 0.1–1.0)
Monocytes Relative: 3 %
Monocytes Relative: 2 %
Myelocytes: 12 %
Neutro Abs: 5.9 10*3/uL (ref 1.7–7.7)
Neutro Abs: 1.3 10*3/uL — ABNORMAL LOW (ref 1.7–7.7)
Neutrophils Relative %: 3 %
Neutrophils Relative %: 2 %
Platelets: 28 10*3/uL — CL (ref 150–400)
Platelets: 35 10*3/uL — ABNORMAL LOW (ref 150–400)
RBC: 3.02 MIL/uL — ABNORMAL LOW (ref 3.87–5.11)
RBC: 3.14 MIL/uL — ABNORMAL LOW (ref 3.87–5.11)
RDW: 24 % — ABNORMAL HIGH (ref 11.5–15.5)
RDW: 24.4 % — ABNORMAL HIGH (ref 11.5–15.5)
WBC: 25.5 10*3/uL — ABNORMAL HIGH (ref 4.0–10.5)
WBC: 33.3 10*3/uL — ABNORMAL HIGH (ref 4.0–10.5)
nRBC: 65.7 % — ABNORMAL HIGH (ref 0.0–0.2)
nRBC: 57.1 % — ABNORMAL HIGH (ref 0.0–0.2)

## 2018-06-16 LAB — BASIC METABOLIC PANEL
Anion gap: 10 (ref 5–15)
BUN: 16 mg/dL (ref 8–23)
CO2: 26 mmol/L (ref 22–32)
Calcium: 9 mg/dL (ref 8.9–10.3)
Chloride: 95 mmol/L — ABNORMAL LOW (ref 98–111)
Creatinine, Ser: 0.46 mg/dL (ref 0.44–1.00)
GFR calc Af Amer: >60 mL/min (ref >60)
GFR calc non Af Amer: >60 mL/min (ref >60)
Glucose, Bld: 219 mg/dL — ABNORMAL HIGH (ref 70–99)
Potassium: 3.8 mmol/L (ref 3.5–5.1)
Sodium: 131 mmol/L — ABNORMAL LOW (ref 135–145)

## 2018-06-16 LAB — SAMPLE TO BLOOD BANK

## 2018-06-16 MED ORDER — SODIUM CHLORIDE 0.9% FLUSH
10.0000 mL | INTRAVENOUS | Status: AC | PRN
  Administered 2018-06-16: 10 mL
  Filled 2018-06-16: qty 10

## 2018-06-16 MED ORDER — HEPARIN SOD (PORK) LOCK FLUSH 100 UNIT/ML IV SOLN: 250.0000 [IU] | INTRAVENOUS | Status: DC | PRN

## 2018-06-16 NOTE — Progress Notes (Signed)
Patient came in for labs and possible blood transfusion today.  After having labs drawn, pt had an incontinent episode and husband left with patient to go back to facility to get cleaned up.  Labs back and reviewed with Honor Loh, NP, confirmed that patient does not need a transfusion today.  Spoke to [employer] at facility to notify them that patient does not need to return to clinic today but keep scheduled appt times(8am lab, 8:30 possible transfusion) for Friday.

## 2018-06-18 ENCOUNTER — Inpatient Hospital Stay: Payer: Medicare Other

## 2018-06-18 ENCOUNTER — Other Ambulatory Visit: Payer: Self-pay | Admitting: Urgent Care

## 2018-06-18 DIAGNOSIS — E785 Hyperlipidemia, unspecified: Secondary | ICD-10-CM | POA: Diagnosis not present

## 2018-06-18 DIAGNOSIS — M1990 Unspecified osteoarthritis, unspecified site: Secondary | ICD-10-CM | POA: Diagnosis not present

## 2018-06-18 DIAGNOSIS — C9200 Acute myeloblastic leukemia, not having achieved remission: Secondary | ICD-10-CM

## 2018-06-18 DIAGNOSIS — Z79899 Other long term (current) drug therapy: Secondary | ICD-10-CM | POA: Diagnosis not present

## 2018-06-18 DIAGNOSIS — N189 Chronic kidney disease, unspecified: Secondary | ICD-10-CM | POA: Diagnosis not present

## 2018-06-18 DIAGNOSIS — E1122 Type 2 diabetes mellitus with diabetic chronic kidney disease: Secondary | ICD-10-CM | POA: Diagnosis not present

## 2018-06-18 DIAGNOSIS — D696 Thrombocytopenia, unspecified: Secondary | ICD-10-CM

## 2018-06-18 DIAGNOSIS — K219 Gastro-esophageal reflux disease without esophagitis: Secondary | ICD-10-CM | POA: Diagnosis not present

## 2018-06-18 DIAGNOSIS — K259 Gastric ulcer, unspecified as acute or chronic, without hemorrhage or perforation: Secondary | ICD-10-CM | POA: Diagnosis not present

## 2018-06-18 DIAGNOSIS — K2970 Gastritis, unspecified, without bleeding: Secondary | ICD-10-CM | POA: Diagnosis not present

## 2018-06-18 DIAGNOSIS — R531 Weakness: Secondary | ICD-10-CM | POA: Diagnosis not present

## 2018-06-18 DIAGNOSIS — J45909 Unspecified asthma, uncomplicated: Secondary | ICD-10-CM | POA: Diagnosis not present

## 2018-06-18 DIAGNOSIS — D61818 Other pancytopenia: Secondary | ICD-10-CM | POA: Diagnosis not present

## 2018-06-18 DIAGNOSIS — D630 Anemia in neoplastic disease: Secondary | ICD-10-CM

## 2018-06-18 DIAGNOSIS — Z7984 Long term (current) use of oral hypoglycemic drugs: Secondary | ICD-10-CM | POA: Diagnosis not present

## 2018-06-18 DIAGNOSIS — I129 Hypertensive chronic kidney disease with stage 1 through stage 4 chronic kidney disease, or unspecified chronic kidney disease: Secondary | ICD-10-CM | POA: Diagnosis not present

## 2018-06-18 LAB — CBC WITH DIFFERENTIAL/PLATELET
Basophils Absolute: 0 10*3/uL (ref 0.0–0.1)
Basophils Relative: 0 %
Blasts: 75 %
Eosinophils Absolute: 0 10*3/uL (ref 0.0–0.5)
Eosinophils Relative: 0 %
HCT: 30.7 % — ABNORMAL LOW (ref 36.0–46.0)
Hemoglobin: 9.1 g/dL — ABNORMAL LOW (ref 12.0–15.0)
Lymphocytes Relative: 18 %
Lymphs Abs: 6.3 10*3/uL — ABNORMAL HIGH (ref 0.7–4.0)
MCH: 28.8 pg (ref 26.0–34.0)
MCHC: 29.6 g/dL — ABNORMAL LOW (ref 30.0–36.0)
MCV: 97.2 fL (ref 80.0–100.0)
Monocytes Absolute: 1.1 10*3/uL — ABNORMAL HIGH (ref 0.1–1.0)
Monocytes Relative: 3 %
Neutro Abs: 1.4 10*3/uL — ABNORMAL LOW (ref 1.7–7.7)
Neutrophils Relative %: 4 %
Platelets: 21 10*3/uL — CL (ref 150–400)
RBC: 3.16 MIL/uL — ABNORMAL LOW (ref 3.87–5.11)
RDW: 25.6 % — ABNORMAL HIGH (ref 11.5–15.5)
WBC: 35.2 10*3/uL — ABNORMAL HIGH (ref 4.0–10.5)
nRBC: 49.8 % — ABNORMAL HIGH (ref 0.0–0.2)
nRBC: 69 /100 WBC — ABNORMAL HIGH

## 2018-06-18 LAB — SAMPLE TO BLOOD BANK

## 2018-06-18 MED ORDER — SODIUM CHLORIDE 0.9% IV SOLUTION
250.0000 mL | Freq: Once | INTRAVENOUS | Status: AC
  Administered 2018-06-18: 250 mL via INTRAVENOUS
  Filled 2018-06-18: qty 250

## 2018-06-18 MED ORDER — DIPHENHYDRAMINE HCL 25 MG PO CAPS
25.0000 mg | ORAL_CAPSULE | Freq: Once | ORAL | Status: AC
  Administered 2018-06-18: 25 mg via ORAL
  Filled 2018-06-18: qty 1

## 2018-06-18 MED ORDER — SODIUM CHLORIDE 0.9% FLUSH
10.0000 mL | INTRAVENOUS | Status: AC | PRN
  Administered 2018-06-18: 10 mL
  Filled 2018-06-18: qty 10

## 2018-06-18 MED ORDER — ACETAMINOPHEN 325 MG PO TABS
650.0000 mg | ORAL_TABLET | Freq: Once | ORAL | Status: AC
  Administered 2018-06-18: 650 mg via ORAL
  Filled 2018-06-18: qty 2

## 2018-06-18 MED ORDER — HEPARIN SOD (PORK) LOCK FLUSH 100 UNIT/ML IV SOLN
500.0000 [IU] | Freq: Once | INTRAVENOUS | Status: AC
  Administered 2018-06-18: 500 [IU] via INTRAVENOUS
  Filled 2018-06-18: qty 5

## 2018-06-18 MED ORDER — HEPARIN SOD (PORK) LOCK FLUSH 100 UNIT/ML IV SOLN: 250.0000 [IU] | INTRAVENOUS | Status: DC | PRN

## 2018-06-18 MED ORDER — SODIUM CHLORIDE 0.9% FLUSH
10.0000 mL | INTRAVENOUS | Status: DC | PRN
  Filled 2018-06-18: qty 10

## 2018-06-20 LAB — PREPARE PLATELET PHERESIS: Unit division: 0

## 2018-06-20 LAB — BPAM PLATELET PHERESIS
Blood Product Expiration Date: 201910122359
ISSUE DATE / TIME: 201910111058
Unit Type and Rh: 6200

## 2018-06-21 ENCOUNTER — Inpatient Hospital Stay: Payer: Medicare Other

## 2018-06-23 ENCOUNTER — Inpatient Hospital Stay: Payer: Medicare Other

## 2018-06-23 VITALS — BP 121/71 | HR 90 | Temp 99.1°F | Resp 20

## 2018-06-23 DIAGNOSIS — D696 Thrombocytopenia, unspecified: Secondary | ICD-10-CM

## 2018-06-23 DIAGNOSIS — D61818 Other pancytopenia: Secondary | ICD-10-CM | POA: Diagnosis not present

## 2018-06-23 DIAGNOSIS — K259 Gastric ulcer, unspecified as acute or chronic, without hemorrhage or perforation: Secondary | ICD-10-CM | POA: Diagnosis not present

## 2018-06-23 DIAGNOSIS — K219 Gastro-esophageal reflux disease without esophagitis: Secondary | ICD-10-CM | POA: Diagnosis not present

## 2018-06-23 DIAGNOSIS — K2970 Gastritis, unspecified, without bleeding: Secondary | ICD-10-CM | POA: Diagnosis not present

## 2018-06-23 DIAGNOSIS — R531 Weakness: Secondary | ICD-10-CM | POA: Diagnosis not present

## 2018-06-23 DIAGNOSIS — Z79899 Other long term (current) drug therapy: Secondary | ICD-10-CM | POA: Diagnosis not present

## 2018-06-23 DIAGNOSIS — C9200 Acute myeloblastic leukemia, not having achieved remission: Secondary | ICD-10-CM | POA: Diagnosis not present

## 2018-06-23 DIAGNOSIS — M1990 Unspecified osteoarthritis, unspecified site: Secondary | ICD-10-CM | POA: Diagnosis not present

## 2018-06-23 DIAGNOSIS — Z7984 Long term (current) use of oral hypoglycemic drugs: Secondary | ICD-10-CM | POA: Diagnosis not present

## 2018-06-23 DIAGNOSIS — N189 Chronic kidney disease, unspecified: Secondary | ICD-10-CM | POA: Diagnosis not present

## 2018-06-23 DIAGNOSIS — E785 Hyperlipidemia, unspecified: Secondary | ICD-10-CM | POA: Diagnosis not present

## 2018-06-23 DIAGNOSIS — E1122 Type 2 diabetes mellitus with diabetic chronic kidney disease: Secondary | ICD-10-CM | POA: Diagnosis not present

## 2018-06-23 DIAGNOSIS — J45909 Unspecified asthma, uncomplicated: Secondary | ICD-10-CM | POA: Diagnosis not present

## 2018-06-23 DIAGNOSIS — I129 Hypertensive chronic kidney disease with stage 1 through stage 4 chronic kidney disease, or unspecified chronic kidney disease: Secondary | ICD-10-CM | POA: Diagnosis not present

## 2018-06-23 LAB — CBC WITH DIFFERENTIAL/PLATELET
Basophils Absolute: 0 10*3/uL (ref 0.0–0.1)
Basophils Relative: 0 %
Blasts: 64 %
Eosinophils Absolute: 0 10*3/uL (ref 0.0–0.5)
Eosinophils Relative: 0 %
HCT: 31.2 % — ABNORMAL LOW (ref 36.0–46.0)
Hemoglobin: 9.4 g/dL — ABNORMAL LOW (ref 12.0–15.0)
Lymphocytes Relative: 30 %
Lymphs Abs: 12.8 10*3/uL — ABNORMAL HIGH (ref 0.7–4.0)
MCH: 28.9 pg (ref 26.0–34.0)
MCHC: 30.1 g/dL (ref 30.0–36.0)
MCV: 96 fL (ref 80.0–100.0)
Monocytes Absolute: 1.7 10*3/uL — ABNORMAL HIGH (ref 0.1–1.0)
Monocytes Relative: 4 %
Neutro Abs: 0.9 10*3/uL — ABNORMAL LOW (ref 1.7–7.7)
Neutrophils Relative %: 2 %
Platelet Morphology: NORMAL
Platelets: 19 10*3/uL — CL (ref 150–400)
RBC: 3.25 MIL/uL — ABNORMAL LOW (ref 3.87–5.11)
RDW: 25.6 % — ABNORMAL HIGH (ref 11.5–15.5)
WBC: 42.5 10*3/uL — ABNORMAL HIGH (ref 4.0–10.5)
nRBC: 37.9 % — ABNORMAL HIGH (ref 0.0–0.2)

## 2018-06-23 LAB — BASIC METABOLIC PANEL
Anion gap: 11 (ref 5–15)
BUN: 17 mg/dL (ref 8–23)
CO2: 26 mmol/L (ref 22–32)
Calcium: 8.9 mg/dL (ref 8.9–10.3)
Chloride: 94 mmol/L — ABNORMAL LOW (ref 98–111)
Creatinine, Ser: 0.57 mg/dL (ref 0.44–1.00)
GFR calc Af Amer: >60 mL/min (ref >60)
GFR calc non Af Amer: >60 mL/min (ref >60)
Glucose, Bld: 228 mg/dL — ABNORMAL HIGH (ref 70–99)
Potassium: 4 mmol/L (ref 3.5–5.1)
Sodium: 131 mmol/L — ABNORMAL LOW (ref 135–145)

## 2018-06-23 LAB — SAMPLE TO BLOOD BANK

## 2018-06-23 MED ORDER — SODIUM CHLORIDE 0.9% FLUSH
10.0000 mL | INTRAVENOUS | Status: AC | PRN
  Administered 2018-06-23: 10 mL
  Filled 2018-06-23: qty 10

## 2018-06-23 MED ORDER — ACETAMINOPHEN 325 MG PO TABS
650.0000 mg | ORAL_TABLET | Freq: Once | ORAL | Status: AC
  Administered 2018-06-23: 650 mg via ORAL
  Filled 2018-06-23: qty 2

## 2018-06-23 MED ORDER — DIPHENHYDRAMINE HCL 25 MG PO CAPS
25.0000 mg | ORAL_CAPSULE | Freq: Once | ORAL | Status: AC
  Administered 2018-06-23: 25 mg via ORAL
  Filled 2018-06-23: qty 1

## 2018-06-23 MED ORDER — SODIUM CHLORIDE 0.9% IV SOLUTION
250.0000 mL | Freq: Once | INTRAVENOUS | Status: AC
  Administered 2018-06-23: 250 mL via INTRAVENOUS
  Filled 2018-06-23: qty 250

## 2018-06-23 MED ORDER — HEPARIN SOD (PORK) LOCK FLUSH 100 UNIT/ML IV SOLN
250.0000 [IU] | INTRAVENOUS | Status: AC | PRN
  Administered 2018-06-23: 250 [IU]
  Filled 2018-06-23: qty 5

## 2018-06-24 ENCOUNTER — Other Ambulatory Visit: Payer: Self-pay | Admitting: Urgent Care

## 2018-06-24 ENCOUNTER — Other Ambulatory Visit
Admission: RE | Admit: 2018-06-24 | Discharge: 2018-06-24 | Disposition: A | Discharge status: Home / Self Care | Payer: Medicare Other | Source: Ambulatory Visit | Attending: Urgent Care | Admitting: Urgent Care

## 2018-06-24 DIAGNOSIS — D696 Thrombocytopenia, unspecified: Secondary | ICD-10-CM

## 2018-06-24 DIAGNOSIS — D649 Anemia, unspecified: Secondary | ICD-10-CM

## 2018-06-24 DIAGNOSIS — C9200 Acute myeloblastic leukemia, not having achieved remission: Secondary | ICD-10-CM

## 2018-06-24 DIAGNOSIS — R197 Diarrhea, unspecified: Secondary | ICD-10-CM | POA: Insufficient documentation

## 2018-06-24 LAB — GASTROINTESTINAL PANEL BY PCR, STOOL (REPLACES STOOL CULTURE)

## 2018-06-24 LAB — PREPARE PLATELET PHERESIS: Unit division: 0

## 2018-06-24 LAB — C DIFFICILE QUICK SCREEN W PCR REFLEX
C Diff antigen: NEGATIVE
C Diff interpretation: NOT DETECTED
C Diff toxin: NEGATIVE

## 2018-06-24 LAB — BPAM PLATELET PHERESIS
Blood Product Expiration Date: 201910182359
ISSUE DATE / TIME: 201910161104
Unit Type and Rh: 7300

## 2018-06-24 NOTE — Unmapped (Signed)
Appointments are rescheduled as requested (as of yesterday).    Home phone is continuously busy; therefore not allowing me to leave a message. I've left messages with the patients spouse to return my call regarding appointment change/ I am sending out today via [employer] an appointment change letter (signature required).    Tracey Lang        ===View-only below this line===  ----- Message -----  From: [name], RN  Sent: 06/23/2018  12:55 PM EDT  To: Kirke Corin, MD, *  Subject: Move up New Patient to 10/22 and cancel the *    Hi Tracey Lang,    Please revise/move up this  patient to 06/29/2018 with the opening for 0800am return with 0715am labs. The local oncologist has requested patient be seen sooner and this is approved by Dr. Lonni Fix. Please notify patient.    Thank you  KJ

## 2018-06-25 ENCOUNTER — Other Ambulatory Visit: Payer: Self-pay

## 2018-06-25 ENCOUNTER — Inpatient Hospital Stay (HOSPITAL_BASED_OUTPATIENT_CLINIC_OR_DEPARTMENT_OTHER): Payer: Medicare Other | Admitting: Hematology and Oncology

## 2018-06-25 ENCOUNTER — Emergency Department
Admission: EM | Admit: 2018-06-25 | Discharge: 2018-06-25 | Disposition: A | Discharge status: Home / Self Care | Payer: Medicare Other | Attending: Student in an Organized Health Care Education/Training Program | Admitting: Student in an Organized Health Care Education/Training Program

## 2018-06-25 ENCOUNTER — Inpatient Hospital Stay: Payer: Medicare Other

## 2018-06-25 DIAGNOSIS — Z79899 Other long term (current) drug therapy: Secondary | ICD-10-CM | POA: Insufficient documentation

## 2018-06-25 DIAGNOSIS — E1122 Type 2 diabetes mellitus with diabetic chronic kidney disease: Secondary | ICD-10-CM | POA: Insufficient documentation

## 2018-06-25 DIAGNOSIS — R233 Spontaneous ecchymoses: Secondary | ICD-10-CM

## 2018-06-25 DIAGNOSIS — Z85828 Personal history of other malignant neoplasm of skin: Secondary | ICD-10-CM | POA: Diagnosis not present

## 2018-06-25 DIAGNOSIS — J45909 Unspecified asthma, uncomplicated: Secondary | ICD-10-CM | POA: Diagnosis not present

## 2018-06-25 DIAGNOSIS — Z7189 Other specified counseling: Secondary | ICD-10-CM

## 2018-06-25 DIAGNOSIS — D696 Thrombocytopenia, unspecified: Secondary | ICD-10-CM

## 2018-06-25 DIAGNOSIS — R197 Diarrhea, unspecified: Secondary | ICD-10-CM

## 2018-06-25 DIAGNOSIS — Z7984 Long term (current) use of oral hypoglycemic drugs: Secondary | ICD-10-CM | POA: Diagnosis not present

## 2018-06-25 DIAGNOSIS — C92A0 Acute myeloid leukemia with multilineage dysplasia, not having achieved remission: Secondary | ICD-10-CM | POA: Diagnosis not present

## 2018-06-25 DIAGNOSIS — C9200 Acute myeloblastic leukemia, not having achieved remission: Secondary | ICD-10-CM

## 2018-06-25 DIAGNOSIS — K259 Gastric ulcer, unspecified as acute or chronic, without hemorrhage or perforation: Secondary | ICD-10-CM | POA: Diagnosis not present

## 2018-06-25 DIAGNOSIS — I129 Hypertensive chronic kidney disease with stage 1 through stage 4 chronic kidney disease, or unspecified chronic kidney disease: Secondary | ICD-10-CM | POA: Diagnosis not present

## 2018-06-25 DIAGNOSIS — K2970 Gastritis, unspecified, without bleeding: Secondary | ICD-10-CM | POA: Diagnosis not present

## 2018-06-25 DIAGNOSIS — E785 Hyperlipidemia, unspecified: Secondary | ICD-10-CM | POA: Diagnosis not present

## 2018-06-25 DIAGNOSIS — N189 Chronic kidney disease, unspecified: Secondary | ICD-10-CM | POA: Diagnosis not present

## 2018-06-25 DIAGNOSIS — Z9221 Personal history of antineoplastic chemotherapy: Secondary | ICD-10-CM | POA: Diagnosis not present

## 2018-06-25 DIAGNOSIS — M1990 Unspecified osteoarthritis, unspecified site: Secondary | ICD-10-CM | POA: Diagnosis not present

## 2018-06-25 DIAGNOSIS — K219 Gastro-esophageal reflux disease without esophagitis: Secondary | ICD-10-CM | POA: Diagnosis not present

## 2018-06-25 DIAGNOSIS — D61818 Other pancytopenia: Secondary | ICD-10-CM | POA: Diagnosis not present

## 2018-06-25 DIAGNOSIS — R600 Localized edema: Secondary | ICD-10-CM

## 2018-06-25 DIAGNOSIS — D649 Anemia, unspecified: Secondary | ICD-10-CM

## 2018-06-25 DIAGNOSIS — R531 Weakness: Secondary | ICD-10-CM | POA: Diagnosis not present

## 2018-06-25 LAB — CBC WITH DIFFERENTIAL/PLATELET
Basophils Absolute: 0 10*3/uL (ref 0.0–0.1)
Basophils Absolute: 0 10*3/uL (ref 0.0–0.1)
Basophils Relative: 0 %
Basophils Relative: 0 %
Blasts: 83 %
Blasts: 82 %
Eosinophils Absolute: 0 10*3/uL (ref 0.0–0.5)
Eosinophils Absolute: 0 10*3/uL (ref 0.0–0.5)
Eosinophils Relative: 0 %
Eosinophils Relative: 0 %
HCT: 30.7 % — ABNORMAL LOW (ref 36.0–46.0)
HCT: 29.9 % — ABNORMAL LOW (ref 36.0–46.0)
Hemoglobin: 9.3 g/dL — ABNORMAL LOW (ref 12.0–15.0)
Hemoglobin: 9.2 g/dL — ABNORMAL LOW (ref 12.0–15.0)
Lymphocytes Relative: 6 %
Lymphocytes Relative: 14 %
Lymphs Abs: 2.8 10*3/uL (ref 0.7–4.0)
Lymphs Abs: 6.5 10*3/uL — ABNORMAL HIGH (ref 0.7–4.0)
MCH: 28.8 pg (ref 26.0–34.0)
MCH: 29.6 pg (ref 26.0–34.0)
MCHC: 30.3 g/dL (ref 30.0–36.0)
MCHC: 30.8 g/dL (ref 30.0–36.0)
MCV: 95 fL (ref 80.0–100.0)
MCV: 96.1 fL (ref 80.0–100.0)
Metamyelocytes Relative: 1 %
Monocytes Absolute: 2.8 10*3/uL — ABNORMAL HIGH (ref 0.1–1.0)
Monocytes Absolute: 0.5 10*3/uL (ref 0.1–1.0)
Monocytes Relative: 6 %
Monocytes Relative: 1 %
Neutro Abs: 2.3 10*3/uL (ref 1.7–7.7)
Neutro Abs: 1.4 10*3/uL — ABNORMAL LOW (ref 1.7–7.7)
Neutrophils Relative %: 4 %
Neutrophils Relative %: 3 %
Platelet Morphology: NORMAL
Platelets: 19 10*3/uL — CL (ref 150–400)
Platelets: 16 10*3/uL — CL (ref 150–400)
RBC: 3.23 MIL/uL — ABNORMAL LOW (ref 3.87–5.11)
RBC: 3.11 MIL/uL — ABNORMAL LOW (ref 3.87–5.11)
RDW: 25.3 % — ABNORMAL HIGH (ref 11.5–15.5)
RDW: 25.3 % — ABNORMAL HIGH (ref 11.5–15.5)
Smear Review: NORMAL
Smear Review: DECREASED
WBC Morphology: ABNORMAL
WBC: 46.6 10*3/uL — ABNORMAL HIGH (ref 4.0–10.5)
WBC: 46.6 10*3/uL — ABNORMAL HIGH (ref 4.0–10.5)
nRBC: 26.7 % — ABNORMAL HIGH (ref 0.0–0.2)
nRBC: 26.6 % — ABNORMAL HIGH (ref 0.0–0.2)

## 2018-06-25 LAB — COMPREHENSIVE METABOLIC PANEL
ALT: 17 U/L (ref 0–44)
AST: 32 U/L (ref 15–41)
Albumin: 3.1 g/dL — ABNORMAL LOW (ref 3.5–5.0)
Alkaline Phosphatase: 95 U/L (ref 38–126)
Anion gap: 8 (ref 5–15)
BUN: 24 mg/dL — ABNORMAL HIGH (ref 8–23)
CO2: 26 mmol/L (ref 22–32)
Calcium: 8.5 mg/dL — ABNORMAL LOW (ref 8.9–10.3)
Chloride: 95 mmol/L — ABNORMAL LOW (ref 98–111)
Creatinine, Ser: 0.52 mg/dL (ref 0.44–1.00)
GFR calc Af Amer: >60 mL/min (ref >60)
GFR calc non Af Amer: >60 mL/min (ref >60)
Glucose, Bld: 145 mg/dL — ABNORMAL HIGH (ref 70–99)
Potassium: 3.8 mmol/L (ref 3.5–5.1)
Sodium: 129 mmol/L — ABNORMAL LOW (ref 135–145)
Total Bilirubin: 2.7 mg/dL — ABNORMAL HIGH (ref 0.3–1.2)
Total Protein: 7.9 g/dL (ref 6.5–8.1)

## 2018-06-25 LAB — PLATELET COUNT: Platelets: 34 10*3/uL — ABNORMAL LOW (ref 150–400)

## 2018-06-25 LAB — TYPE AND SCREEN
ABO/RH(D): A POS
Antibody Screen: NEGATIVE

## 2018-06-25 LAB — BASIC METABOLIC PANEL
Anion gap: 10 (ref 5–15)
BUN: 23 mg/dL (ref 8–23)
CO2: 26 mmol/L (ref 22–32)
Calcium: 8.8 mg/dL — ABNORMAL LOW (ref 8.9–10.3)
Chloride: 92 mmol/L — ABNORMAL LOW (ref 98–111)
Creatinine, Ser: 0.59 mg/dL (ref 0.44–1.00)
GFR calc Af Amer: >60 mL/min (ref >60)
GFR calc non Af Amer: >60 mL/min (ref >60)
Glucose, Bld: 223 mg/dL — ABNORMAL HIGH (ref 70–99)
Potassium: 3.7 mmol/L (ref 3.5–5.1)
Sodium: 128 mmol/L — ABNORMAL LOW (ref 135–145)

## 2018-06-25 LAB — SAMPLE TO BLOOD BANK

## 2018-06-25 MED ORDER — SODIUM CHLORIDE 0.9 % IV SOLN
10.0000 mL/h | Freq: Once | INTRAVENOUS | Status: AC
  Administered 2018-06-25: 10 mL/h via INTRAVENOUS

## 2018-06-25 NOTE — ED Provider Notes (Signed)
-----------------------------------------   6:27 PM on 06/25/2018 -----------------------------------------  Platelets recheck his up to 34.  We will discharge patient home with hematology follow-up.   Harvest Dark, MD 06/25/18 1827

## 2018-06-25 NOTE — ED Notes (Signed)
Pt resting in bed, called this RN to bedside. This RN explained patient was going to be transfused with Plateletts. Pt states "well I know but I want my husband back here". Pt's husband called while this RN at bedside, states he will be on the way.

## 2018-06-25 NOTE — ED Notes (Signed)
EDP To bedside at this time.

## 2018-06-25 NOTE — ED Notes (Signed)
Pt's husband at bedside.

## 2018-06-25 NOTE — Progress Notes (Signed)
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  HPI:  The patient was last seen in the hematology Holland on 05/24/2018.  At that time, she remained fatigued. There was progressive swelling to her LEFT upper and lower extremities. Patient described areas as "achy". She denied anyfever. Exam revealed ecchymosis in left forearm and swelling in her hand.  She had progressive swelling since 05/21/2018.  Hand was slightly cool and pulses difficult to appreciate.  She had increased left lower extremity edema distally from her hip down.  She had extensive sacral ecchymosis which has extended around her abdomen. She was mildly jaundiced.  Hemoglobin had dropped to 6.2 (previously 10.9). Platelets were35,000. [place] was600.  We discussed her rising blast count.  She continued venetoclax.  Decision was made to admit to the hospital.  She was admitted to [facility] from 05/24/2018 - 05/27/2018.  She received 5 units of PRBCs and 1 unit of platelets for a gluteal hematoma.  She was felt to have active bleeding into the left arm and thigh.  She was transferred to [facility].  She was admitted to [facility] from 05/27/2018 - 06/08/2018. She received 2 units of pRBC. Von Willebrand work-up was negative. Transfusion reaction work-up was negative. Lower extremity CT scan indicated a decrease in the size of the gluteal hematoma.  She was started on folic acid and tranexamic acid. PICC line was placed. DIC labs were unremarkable. Patient was given transfusions as needed. She received 2 units on 09/19, 1 unit on 09/20, 1 unit on 09/21, and 2 units on 09/22, 1 unit on 09/25 plus 1 unit of platelets each on 09/22 and 09/25.  Platelets were maintained greater than 50,000 initially.  This goal was gradually reduced to 30,000 over the course of admission.  Transfusion dependency was thought to be due to hematomas. Patient was monitored until transfusion dependency had decreased.  She was felt toneed regular monitoring 2-3 times weekly for platelets/pRBCs.   She was noted to have an direct hyperbilirubinemia on admission. Abdominal ultrasound indicated moderate intrahepatic biliary dilation. 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  Patient was found to be hyponatremic on admission. She was started on gentle fluid resuscitation with normal saline. Her sodium was monitored and improved over the course of admission. By discharge, patient was no longer receiving fluids and was regulating her sodium with counseling on fluid intake. She was told to limit free water intake and to include fluids like gatorade for her daily fluid intake.   While inpatient, patient was found to have high circulating blasts (~80%). Per Dr. Prince Solian, further chemotherapy was unlikely to result in remission. This was discussed with patient and husband separately.  A MOST form may be beneficial, but these discussions were deferred to her primary oncologist.   She was discharged to [facility].  She states that she remains very weak.  She has difficulty standing secondary to muscle strength. She needs assistance with her ADLs.  She denies any bleeding.  She denies any fevers.   Past Medical History:  Diagnosis Date  . Arthritis   . Asthma   . Blood transfusion without reported diagnosis   . 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  . Cataract   . Chronic kidney disease  stage 1  . Diabetes mellitus 2007   type II  . Dry eye of right side   . GERD (gastroesophageal reflux disease)    OCC  . Glaucoma 2018   RIGHT EYE   . Hemorrhoids   . Hyperlipidemia   . Hypertension     Past Surgical History:  Procedure Laterality Date  . CATARACT EXTRACTION,  BILATERAL    . CHOLECYSTECTOMY    . COLONOSCOPY  02/13/13   Dr Bary Castilla  . EPIGASTRIC HERNIA REPAIR N/A 03/20/2015   Procedure: HERNIA REPAIR EPIGASTRIC ADULT;  Surgeon: Robert Bellow, MD;  Location: [facility] ORS;  Service: General;  Laterality: N/A;  . ESOPHAGOGASTRODUODENOSCOPY N/A 02/19/2018   Procedure: ESOPHAGOGASTRODUODENOSCOPY (EGD);  Surgeon: Lin Landsman, MD;  Location: [facility] ENDOSCOPY;  Service: Gastroenterology;  Laterality: N/A;  . HEMORRHOID SURGERY N/A 03/20/2015    FOCAL HIGH-GRADE SQUAMOUS INTRAEPITHELIAL LESION (HSIL, ANAL /HEMORRHOIDECTOMY;   Robert Bellow, MD [facility] ORS;  : General;  Laterality: N/A;  . HERNIA REPAIR  July 2016   Epigastric hernia, primary repair  . INCISION AND DRAINAGE Right 02/11/2018   Procedure: INCISION AND DRAINAGE- RIGHT HAND;  Surgeon: Hessie Knows, MD;  Location: [facility] ORS;  Service: Orthopedics;  Laterality: Right;  . TONSILLECTOMY  age 43  . TOTAL ABDOMINAL HYSTERECTOMY  01/1989  . TUBAL LIGATION    . 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  Case has been presented at the [facility] tumor board. No indication for additional treatment outside of serial exams  Surgeon: Robert Bellow, MD;  Location: [facility] ORS;  Service: General;  Laterality: N/A;    Family History  Problem Relation Age of Onset  . Bladder Cancer Mother   . Colon cancer Father   . Lung cancer Brother   . Lymphoma Sister   . Breast cancer Neg Hx     Social History:  reports that she has never smoked. She has never used smokeless tobacco. She reports that she does not drink alcohol or use drugs.  She lives in [place] with her husband.  She denies any exposure to radiations or toxins.  Her husband's name is [name].  Allergies:  Allergies  Allergen Reactions  . Hydrocodone Nausea And Vomiting and Other (See Comments)    Vertigo but patient takes hydrocodone/acetaminophen outpatient  .  Oxycodone Other (See Comments)    Unknown reaction    Current Medications: Current Outpatient Medications  Medication Sig Dispense Refill  . ACCU-CHEK SMARTVIEW test strip USE AS INSTRUCTED TO CHECK  BLOOD GLUCOSE ONCE DAILY if desired; LON 99 months; Dx E11.9 (Patient not taking: Reported on 06/29/2018) 100 each 1  . acetaminophen (TYLENOL) 325 MG tablet Take 650 mg by mouth every 6 (six) hours as needed for mild pain, moderate pain or fever.    Marland Kitchen ADVAIR DISKUS 100-50 MCG/DOSE AEPB USE 1 PUFF TWO TIMES DAILY 3 each 2  . amLODipine (NORVASC) 5 MG tablet Take 5 mg by mouth daily.    . carvedilol (COREG) 25 MG tablet Take 25 mg by mouth 2 (two) times daily.    . carvedilol (COREG) 6.25 MG tablet Take 1 tablet (6.25 mg total) by mouth 2 (two) times daily with a meal. 308 tablet 1  . folic acid (FOLVITE) 1 MG tablet Take 1 mg by mouth daily.    . heparin flush 10 UNIT/ML SOLN injection 5 Units as needed.    . hydrOXYzine (ATARAX/VISTARIL)  25 MG tablet Take 25 mg by mouth every 6 (six) hours as needed for anxiety or itching.    . latanoprost (XALATAN) 0.005 % ophthalmic solution Place 1 drop at bedtime into the right eye.    Marland Kitchen levofloxacin (LEVAQUIN) 500 MG tablet Take 500 mg by mouth daily.    Marland Kitchen lidocaine (LIDODERM) 5 % Place 1 patch onto the skin daily. Remove & Discard patch within 12 hours or as directed by MD    . lisinopril (PRINIVIL,ZESTRIL) 20 MG tablet Take 20 mg by mouth daily.    Marland Kitchen loperamide (IMODIUM A-D) 2 MG tablet Take 2 mg by mouth as needed for diarrhea or loose stools. Up to 8 tabs/24 hours    . metFORMIN (GLUCOPHAGE) 850 MG tablet TAKE 1 TABLET BY MOUTH TWO  TIMES DAILY WITH A MEAL 180 tablet 0  . Multiple Vitamin (TAB-A-VITE) TABS Take 1 tablet by mouth daily.    Marland Kitchen oxyCODONE-acetaminophen (PERCOCET/ROXICET) 5-325 MG tablet Take 1 tablet by mouth every 6 (six) hours as needed for severe pain.    . pantoprazole (PROTONIX) 40 MG tablet Take 1 tablet (40 mg total) by mouth 2 (two)  times daily before a meal. 180 tablet 0  . polyethylene glycol (MIRALAX / GLYCOLAX) packet Take 17 g by mouth daily.    . simvastatin (ZOCOR) 20 MG tablet Take 1 tablet (20 mg total) by mouth at bedtime. 90 tablet 0  . sodium chloride 0.9 % injection Inject 10 mLs into the vein as needed.    . tranexamic acid (LYSTEDA) 650 MG TABS tablet Take 1,300 mg by mouth 2 (two) times daily.    . valACYclovir (VALTREX) 500 MG tablet Take 500 mg by mouth daily.     No current facility-administered medications for this visit.    Facility-Administered Medications Ordered in Other Visits  Medication Dose Route Frequency Provider Last Rate Last Dose  . sodium chloride flush (NS) 0.9 % injection 10 mL  10 mL Intracatheter PRN Corcoran, Melissa C, MD      . sodium chloride flush (NS) 0.9 % injection 3 mL  3 mL Intracatheter PRN Lequita Asal, MD        Review of Systems  Constitutional: Positive for malaise/fatigue. Negative for chills, diaphoresis, fever and weight loss (weight up 3 pounds).  HENT: Negative.  Negative for congestion, nosebleeds, sinus pain and sore throat.   Eyes: Negative.  Negative for blurred vision, double vision and pain.  Respiratory: Negative.  Negative for cough, hemoptysis, sputum production and shortness of breath.   Cardiovascular: Positive for leg swelling (LEFT). Negative for chest pain, palpitations, orthopnea and PND.  Gastrointestinal: Positive for diarrhea. Negative for abdominal pain, blood in stool, constipation, melena, nausea and vomiting.  Genitourinary: Negative.  Negative for dysuria, frequency, hematuria and urgency.  Musculoskeletal: Negative.  Negative for back pain, falls, joint pain, myalgias and neck pain.  Skin: Negative for itching and rash.       Jaundice.  Neurological: Positive for weakness (generalized). Negative for dizziness, tremors, focal weakness and headaches.  Endo/Heme/Allergies: Bruises/bleeds easily.       Diabetes.   Psychiatric/Behavioral: Negative.  Negative for depression, memory loss and suicidal ideas. The patient does not have insomnia.   All other systems reviewed and are negative.  Performance status (ECOG): 2  Vital signs: There were no vitals taken for this visit.  Physical Exam  Constitutional: She is oriented to person, place, and time.  Thin elderly chronically fatigued appearing woman sitting comfortably in  the exam room in no acute distress.  HENT:  Head: Normocephalic and atraumatic.  Mouth/Throat: Oropharynx is clear and moist. No oropharyngeal exudate.  Short gray hair.  Eyes: Pupils are equal, round, and reactive to light. EOM are normal. Scleral icterus is present.  Brown eyes.  Neck: Normal range of motion. Neck supple. No JVD present.  Cardiovascular: Normal rate, regular rhythm and normal heart sounds. Exam reveals no gallop and no friction rub.  No murmur heard. Pulmonary/Chest: Effort normal and breath sounds normal. No respiratory distress. She has no wheezes. She has no rales.  Abdominal: Soft. Bowel sounds are normal. She exhibits no distension and no mass. There is no abdominal tenderness. There is no rebound and no guarding.  Lymphadenopathy:    She has no cervical adenopathy.    She has no axillary adenopathy.       Right: No inguinal and no supraclavicular adenopathy present.       Left: No inguinal and no supraclavicular adenopathy present.  Neurological: She is alert and oriented to person, place, and time.  Skin: Skin is warm and dry.  Jaundice. Fading ecchymosis.   Psychiatric: Mood, affect and judgment normal.  Nursing note and vitals reviewed.   Admission on 06/25/2018, Discharged on 06/25/2018  Component Date Value Ref Range Status  . WBC 06/25/2018 46.6* 4.0 - 10.5 K/uL Final  . RBC 06/25/2018 3.11* 3.87 - 5.11 MIL/uL Final  . Hemoglobin 06/25/2018 9.2* 12.0 - 15.0 g/dL Final  . HCT 06/25/2018 29.9* 36.0 - 46.0 % Final  . MCV 06/25/2018 96.1  80.0  - 100.0 fL Final  . MCH 06/25/2018 29.6  26.0 - 34.0 pg Final  . MCHC 06/25/2018 30.8  30.0 - 36.0 g/dL Final  . RDW 06/25/2018 25.3* 11.5 - 15.5 % Final  . Platelets 06/25/2018 16* 150 - 400 K/uL Final   Comment: Immature Platelet Fraction may be clinically indicated, consider ordering this additional test [id number] THIS RESULT HAS BEEN CALLED TO JESSICA COLTRANE BY SUSAN RHEW ON 10 18 2019 AT 6295, AND HAS BEEN READ BACK.    Marland Kitchen nRBC 06/25/2018 26.6* 0.0 - 0.2 % Final  . Neutrophils Relative % 06/25/2018 3  % Final  . Neutro Abs 06/25/2018 1.4* 1.7 - 7.7 K/uL Final  . Lymphocytes Relative 06/25/2018 14  % Final  . Lymphs Abs 06/25/2018 6.5* 0.7 - 4.0 K/uL Final  . Monocytes Relative 06/25/2018 1  % Final  . Monocytes Absolute 06/25/2018 0.5  0.1 - 1.0 K/uL Final  . Eosinophils Relative 06/25/2018 0  % Final  . Eosinophils Absolute 06/25/2018 0.0  0.0 - 0.5 K/uL Final  . Basophils Relative 06/25/2018 0  % Final  . Basophils Absolute 06/25/2018 0.0  0.0 - 0.1 K/uL Final  . Smear Review 06/25/2018 PLATELETS APPEAR DECREASED   Final  . Blasts 06/25/2018 82  % Final  . Smudge Cells 06/25/2018 PRESENT   Final  . Polychromasia 06/25/2018 PRESENT   Final   Performed at [facility], [address], [place], [place] [address]  . Sodium 06/25/2018 129* 135 - 145 mmol/L Final  . Potassium 06/25/2018 3.8  3.5 - 5.1 mmol/L Final  . Chloride 06/25/2018 95* 98 - 111 mmol/L Final  . CO2 06/25/2018 26  22 - 32 mmol/L Final  . Glucose, Bld 06/25/2018 145* 70 - 99 mg/dL Final  . BUN 06/25/2018 24* 8 - 23 mg/dL Final  . Creatinine, Ser 06/25/2018 0.52  0.44 - 1.00 mg/dL Final  . Calcium 06/25/2018  8.5* 8.9 - 10.3 mg/dL Final  . Total Protein 06/25/2018 7.9  6.5 - 8.1 g/dL Final  . Albumin 06/25/2018 3.1* 3.5 - 5.0 g/dL Final  . AST 06/25/2018 32  15 - 41 U/L Final  . ALT 06/25/2018 17  0 - 44 U/L Final  . Alkaline Phosphatase 06/25/2018 95  38 - 126 U/L Final  . Total Bilirubin  06/25/2018 2.7* 0.3 - 1.2 mg/dL Final  . GFR calc non Af Amer 06/25/2018 >60  >60 mL/min Final  . GFR calc Af Amer 06/25/2018 >60  >60 mL/min Final   Comment: (NOTE) The eGFR has been calculated using the CKD EPI equation. This calculation has not been validated in all clinical situations. eGFR's persistently <60 mL/min signify possible Chronic Kidney Disease.   Georgiann Hahn gap 06/25/2018 8  5 - 15 Final   Performed at [facility], [address] [place], [place] [address]  . ABO/RH(D) 06/25/2018 A POS   Final  . Antibody Screen 06/25/2018 NEG   Final  . Sample Expiration 06/25/2018    Final                   Value:06/28/2018 Performed at [facility], [address], [place], [place] [address]   . Unit Number 06/25/2018 [id number]   Final  . Blood Component Type 06/25/2018 PLTP LI1 PAS   Final  . Unit division 06/25/2018 00   Final  . Status of Unit 06/25/2018 ISSUED,FINAL   Final  . Transfusion Status 06/25/2018 OK TO TRANSFUSE   Final  . ISSUE DATE / TIME 06/25/2018 [id number]   Final  . Blood Product Unit Number 06/25/2018 [id number]   Final  . PRODUCT CODE 06/25/2018 [id number]   Final  . Unit Type and Rh 06/25/2018 6200   Final  . Blood Product Expiration Date 06/25/2018 [id number]   Final  . Platelets 06/25/2018 34* 150 - 400 K/uL Final   Comment: PLATELET COUNT CONFIRMED BY SMEAR Immature Platelet Fraction may be clinically indicated, consider ordering this additional test [id number] Performed at [facility], [address] [place], [place] [address]   Orders Only on 06/25/2018  Component Date Value Ref Range Status  . WBC 06/25/2018 46.6* 4.0 - 10.5 K/uL Final  . RBC 06/25/2018 3.23* 3.87 - 5.11 MIL/uL Final  . Hemoglobin 06/25/2018 9.3* 12.0 - 15.0 g/dL Final  . HCT 06/25/2018 30.7* 36.0 - 46.0 % Final  . MCV 06/25/2018 95.0  80.0 - 100.0 fL Final  . MCH 06/25/2018 28.8  26.0 - 34.0 pg Final  . MCHC 06/25/2018 30.3  30.0 - 36.0  g/dL Final  . RDW 06/25/2018 25.3* 11.5 - 15.5 % Final  . Platelets 06/25/2018 19* 150 - 400 K/uL Final   Comment: REPEATED TO VERIFY PLATELET COUNT CONFIRMED BY SMEAR SPECIMEN CHECKED FOR CLOTS CRITICAL VALUE NOTED.  VALUE IS CONSISTENT WITH PREVIOUSLY REPORTED AND CALLED VALUE.   Marland Kitchen nRBC 06/25/2018 26.7* 0.0 - 0.2 % Final  . Neutrophils Relative % 06/25/2018 4  % Final  . Neutro Abs 06/25/2018 2.3  1.7 - 7.7 K/uL Final  . Lymphocytes Relative 06/25/2018 6  % Final  . Lymphs Abs 06/25/2018 2.8  0.7 - 4.0 K/uL Final  . Monocytes Relative 06/25/2018 6  % Final  . Monocytes Absolute 06/25/2018 2.8* 0.1 - 1.0 K/uL Final  . Eosinophils Relative 06/25/2018 0  % Final  . Eosinophils Absolute 06/25/2018 0.0  0.0 - 0.5 K/uL Final  . Basophils Relative 06/25/2018 0  %  Final  . Basophils Absolute 06/25/2018 0.0  0.0 - 0.1 K/uL Final  . WBC Morphology 06/25/2018 Abnormal lymphocytes present   Final  . Smear Review 06/25/2018 Normal platelet morphology   Final   Comment: PLATELETS APPEAR DECREASED PLATELET COUNT CONFIRMED BY SMEAR   . Metamyelocytes Relative 06/25/2018 1  % Final  . Blasts 06/25/2018 83  % Final  . Smudge Cells 06/25/2018 PRESENT   Final  . Polychromasia 06/25/2018 PRESENT   Final  . Platelet Morphology 06/25/2018 NORMAL   Final   Performed at [facility], [address] [place], [place] [address]  Clinical Support on 06/25/2018  Component Date Value Ref Range Status  . Blood Bank Specimen 06/25/2018 SAMPLE AVAILABLE FOR TESTING   Final  . Sample Expiration 06/25/2018    Final                   Value:06/28/2018 Performed at [facility], [address] [place], [place] [address]   . Sodium 06/25/2018 128* 135 - 145 mmol/L Final  . Potassium 06/25/2018 3.7  3.5 - 5.1 mmol/L Final  . Chloride 06/25/2018 92* 98 - 111 mmol/L Final  . CO2 06/25/2018 26  22 - 32 mmol/L Final  . Glucose, Bld 06/25/2018 223* 70 - 99 mg/dL Final  . BUN 06/25/2018 23  8 - 23  mg/dL Final  . Creatinine, Ser 06/25/2018 0.59  0.44 - 1.00 mg/dL Final  . Calcium 06/25/2018 8.8* 8.9 - 10.3 mg/dL Final  . GFR calc non Af Amer 06/25/2018 >60  >60 mL/min Final  . GFR calc Af Amer 06/25/2018 >60  >60 mL/min Final   Comment: (NOTE) The eGFR has been calculated using the CKD EPI equation. This calculation has not been validated in all clinical situations. eGFR's persistently <60 mL/min signify possible Chronic Kidney Disease.   Georgiann Hahn gap 06/25/2018 10  5 - 15 Final   Performed at [facility], [address], [place], [place] [address]  Hospital Outpatient Visit on 06/24/2018  Component Date Value Ref Range Status  . Campylobacter species 06/24/2018 NOT DETECTED  NOT DETECTED Final  . Plesimonas shigelloides 06/24/2018 NOT DETECTED  NOT DETECTED Final  . Salmonella species 06/24/2018 NOT DETECTED  NOT DETECTED Final  . Yersinia enterocolitica 06/24/2018 NOT DETECTED  NOT DETECTED Final  . Vibrio species 06/24/2018 NOT DETECTED  NOT DETECTED Final  . Vibrio cholerae 06/24/2018 NOT DETECTED  NOT DETECTED Final  . Enteroaggregative E coli (EAEC) 06/24/2018 NOT DETECTED  NOT DETECTED Final  . Enteropathogenic E coli (EPEC) 06/24/2018 NOT DETECTED  NOT DETECTED Final  . Enterotoxigenic E coli (ETEC) 06/24/2018 NOT DETECTED  NOT DETECTED Final  . Shiga like toxin producing E coli * 06/24/2018 NOT DETECTED  NOT DETECTED Final  . Shigella/Enteroinvasive E coli (EI* 06/24/2018 NOT DETECTED  NOT DETECTED Final  . Cryptosporidium 06/24/2018 NOT DETECTED  NOT DETECTED Final  . Cyclospora cayetanensis 06/24/2018 NOT DETECTED  NOT DETECTED Final  . Entamoeba histolytica 06/24/2018 NOT DETECTED  NOT DETECTED Final  . Giardia lamblia 06/24/2018 NOT DETECTED  NOT DETECTED Final  . Adenovirus F40/41 06/24/2018 NOT DETECTED  NOT DETECTED Final  . Astrovirus 06/24/2018 NOT DETECTED  NOT DETECTED Final  . Norovirus GI/GII 06/24/2018 NOT DETECTED  NOT DETECTED Final  . Rotavirus  A 06/24/2018 NOT DETECTED  NOT DETECTED Final  . Sapovirus (I, II, IV, and V) 06/24/2018 NOT DETECTED  NOT DETECTED Final   Performed at [facility], [address] [place], [place] [address]  . C Diff antigen 06/24/2018 NEGATIVE  NEGATIVE Final  . C Diff toxin 06/24/2018 NEGATIVE  NEGATIVE Final  . C Diff interpretation 06/24/2018 No C. difficile detected.   Final   Performed at [facility], [address], [place], [place] [address]  Infusion on 06/23/2018  Component Date Value Ref Range Status  . Unit Number 06/23/2018 [id number]   Final  . Blood Component Type 06/23/2018 PLTP LI1 PAS   Final  . Unit division 06/23/2018 00   Final  . Status of Unit 06/23/2018 ISSUED,FINAL   Final  . Unit tag comment 06/23/2018 IRRADIATED PRODUCT   Final  . Transfusion Status 06/23/2018    Final                   Value:OK TO TRANSFUSE Performed at [facility], [address] [place], [place] [address]   . ISSUE DATE / TIME 06/23/2018 [id number]   Final  . Blood Product Unit Number 06/23/2018 [id number]   Final  . PRODUCT CODE 06/23/2018 E7006V00   Final  . Unit Type and Rh 06/23/2018 7300   Final  . Blood Product Expiration Date 06/23/2018 [id number]   Final  Appointment on 06/23/2018  Component Date Value Ref Range Status  . Sodium 06/23/2018 131* 135 - 145 mmol/L Final  . Potassium 06/23/2018 4.0  3.5 - 5.1 mmol/L Final  . Chloride 06/23/2018 94* 98 - 111 mmol/L Final  . CO2 06/23/2018 26  22 - 32 mmol/L Final  . Glucose, Bld 06/23/2018 228* 70 - 99 mg/dL Final  . BUN 06/23/2018 17  8 - 23 mg/dL Final  . Creatinine, Ser 06/23/2018 0.57  0.44 - 1.00 mg/dL Final  . Calcium 06/23/2018 8.9  8.9 - 10.3 mg/dL Final  . GFR calc non Af Amer 06/23/2018 >60  >60 mL/min Final  . GFR calc Af Amer 06/23/2018 >60  >60 mL/min Final   Comment: (NOTE) The eGFR has been calculated using the CKD EPI equation. This calculation has not been validated in all clinical  situations. eGFR's persistently <60 mL/min signify possible Chronic Kidney Disease.   Georgiann Hahn gap 06/23/2018 11  5 - 15 Final   Performed at [facility], [address] [place], [place] [address]  . WBC 06/23/2018 42.5* 4.0 - 10.5 K/uL Final  . RBC 06/23/2018 3.25* 3.87 - 5.11 MIL/uL Final  . Hemoglobin 06/23/2018 9.4* 12.0 - 15.0 g/dL Final  . HCT 06/23/2018 31.2* 36.0 - 46.0 % Final  . MCV 06/23/2018 96.0  80.0 - 100.0 fL Final  . MCH 06/23/2018 28.9  26.0 - 34.0 pg Final  . MCHC 06/23/2018 30.1  30.0 - 36.0 g/dL Final  . RDW 06/23/2018 25.6* 11.5 - 15.5 % Final  . Platelets 06/23/2018 19* 150 - 400 K/uL Final   Comment: REPEATED TO VERIFY PLATELET COUNT CONFIRMED BY SMEAR SPECIMEN CHECKED FOR CLOTS THIS RESULT HAS BEEN CALLED TO BRYAN GRAYANITA BLACK BY LONNIE RHONE ON 10 16 2019 AT [place], AND HAS BEEN READ BACK.    Marland Kitchen nRBC 06/23/2018 37.9* 0.0 - 0.2 % Final  . Neutrophils Relative % 06/23/2018 2  % Final  . Neutro Abs 06/23/2018 0.9* 1.7 - 7.7 K/uL Final  . Lymphocytes Relative 06/23/2018 30  % Final  . Lymphs Abs 06/23/2018 12.8* 0.7 - 4.0 K/uL Final  . Monocytes Relative 06/23/2018 4  % Final  . Monocytes Absolute 06/23/2018 1.7* 0.1 - 1.0 K/uL Final  . Eosinophils Relative  06/23/2018 0  % Final  . Eosinophils Absolute 06/23/2018 0.0  0.0 - 0.5 K/uL Final  . Basophils Relative 06/23/2018 0  % Final  . Basophils Absolute 06/23/2018 0.0  0.0 - 0.1 K/uL Final  . Blasts 06/23/2018 64  % Final  . Smudge Cells 06/23/2018 PRESENT   Final  . Polychromasia 06/23/2018 PRESENT   Final  . Platelet Morphology 06/23/2018 NORMAL   Final   Performed at [facility], [address] [place], [place] [address]  . Blood Bank Specimen 06/23/2018 SAMPLE AVAILABLE FOR TESTING   Final  . Sample Expiration 06/23/2018    Final                   Value:06/26/2018 Performed at [facility], [address], [place], [place] [address]     Assessment:  JIM PHILEMON is a  77 y.o. 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 Bone marrow biopsy on 04/19/2018 was consistent with AML. Cytogenetics were normal (14, XX) on first 10 metaphases. MLL FISH revealed no MLL rearrangement. BEAT AML trial was offered and declined.   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   She has a history of rectal bleedingrequiring 2 units of PRBCs on 07/31/2016. She underwent oversewing of a bleeding arterial vessel at the site of previous mucosal resection.   [facility] with MSSA sepsisin the setting of right wrist abscess after a fall and injury. TEE was negative.She underwent incision and drainageandwas treated with IV cefazolin x 6 weeks.MRIrightwriston08/08/2018 [facility] no evidence for soft tissue swelling or drainable fluid collection, suggesting pain wassecondary to TFCC tear/degeneration.  She was admitted to [facility] 02/18/2018 - 02/20/2018 with a GIbleed. She received 2 units of PRBCs. EGD revealed gastric ulcers which were non-bleeding and gastritis.   She is transfusion dependent.  Blood products are leukopoor and irradiated.  Peripheral blasts were 28% on 05/07/2018, 57% on 05/10/2018, 60% on 05/12/2018, 65% on 05/14/2018, and 71% on 05/21/2018.   She has mild hyperbilirubinemia of unclear etiology.  Bilirubin was 1.7 on 05/07/2018, 2.8 on 05/14/2018, and 3.4 on 05/21/2018.  Bilirubin is predominantly indirect.  Symptomatically,she remains weak.  Hemoglobin is 9.2.  Platelet count is 16,000.  WBC is 46,6000.  Blast count is 82%.  Plan: 1. Labs today: CBC with diff, BMP, hold tube. 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 Discuss no plan for further chemotherapy. Discuss thoughts about continued  transfusion support. Discuss supportive care.  Discuss Hospice.  Patient is interested in evaluation. 3. Pancytopenia Hemoglobin 9.2, hematocrit 29.9, and platelets 16,000.  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 Patient would like platelet transfusion today. 4. Diarrhea  GI panel and C diff from 06/24/2018 were negative.  Supportive care. 5. Transfer patient to ER for platelet transfusion. 6. Hospice consult at [facility]. 7. RTC prn.   Lequita Asal, MD  06/25/18, 11:01 AM

## 2018-06-25 NOTE — ED Notes (Signed)
Pt called me in that her bed was wet, found that some of the platelets had leaked in bed, cleaned connection and tightened it, no further leakage.  I estimate that the leakage was 25-45cc.  Changed her sheets.  EDP notified. Plan of care explained to pt and family

## 2018-06-25 NOTE — ED Triage Notes (Signed)
Pt comes to ER from the cancer center for low plts. Reported count of 19.

## 2018-06-25 NOTE — ED Provider Notes (Signed)
[facility] Emergency Department Provider Note    First MD Initiated Contact with Patient 06/25/18 1228     (approximate)  I have reviewed the triage vital signs and the nursing notes.   HISTORY  Chief Complaint abnormal labs    HPI GRACELYNN BIRCHER is a 77 y.o. 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  Denies any bleeding.  States that she feels roughly at her baseline.  Denies any other complaints or questions.    Past Medical History:  Diagnosis Date  . Arthritis   . Asthma   . Blood transfusion without reported diagnosis   . 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  . Cataract   . Chronic kidney disease    stage 1  . Diabetes mellitus 2007   type II  . Dry eye of right side   . GERD (gastroesophageal reflux disease)    OCC  . Glaucoma 2018   RIGHT EYE   . Hemorrhoids   . Hyperlipidemia   . Hypertension    Family History  Problem Relation Age of Onset  . Bladder Cancer Mother   . Colon cancer Father   . Lung cancer Brother   . Lymphoma Sister   . Breast cancer Neg Hx    Past Surgical History:  Procedure Laterality Date  . CATARACT EXTRACTION, BILATERAL    . CHOLECYSTECTOMY    . COLONOSCOPY  02/13/13   Dr Bary Castilla  . EPIGASTRIC HERNIA REPAIR N/A 03/20/2015   Procedure: HERNIA REPAIR EPIGASTRIC ADULT;  Surgeon: Robert Bellow, MD;  Location: [facility] ORS;  Service: General;  Laterality: N/A;  . ESOPHAGOGASTRODUODENOSCOPY N/A 02/19/2018   Procedure: ESOPHAGOGASTRODUODENOSCOPY (EGD);  Surgeon: Lin Landsman, MD;  Location: [facility] ENDOSCOPY;  Service: Gastroenterology;  Laterality: N/A;  . HEMORRHOID SURGERY N/A 03/20/2015    FOCAL HIGH-GRADE SQUAMOUS INTRAEPITHELIAL  LESION (HSIL, ANAL /HEMORRHOIDECTOMY;   Robert Bellow, MD [facility] ORS;  : General;  Laterality: N/A;  . HERNIA REPAIR  July 2016   Epigastric hernia, primary repair  . INCISION AND DRAINAGE Right 02/11/2018   Procedure: INCISION AND DRAINAGE- RIGHT HAND;  Surgeon: Hessie Knows, MD;  Location: [facility] ORS;  Service: Orthopedics;  Laterality: Right;  . TONSILLECTOMY  age 12  . TOTAL ABDOMINAL HYSTERECTOMY  01/1989  . TUBAL LIGATION    . 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  Case has been presented at the [facility] tumor board. No indication for additional treatment outside of serial exams  Surgeon: Robert Bellow, MD;  Location: [facility] ORS;  Service: General;  Laterality: N/A;   Patient Active Problem List   Diagnosis Date Noted  . Edema of upper extremity 05/25/2018  . Edema of lower extremity 05/25/2018  . Encounter for antineoplastic chemotherapy 05/24/2018  . Left arm swelling 05/21/2018  . Other neutropenia ([place]) 05/14/2018  . Elevated bilirubin 05/14/2018  . Hypomagnesemia 05/07/2018  . Acute myeloid leukemia not having achieved remission ([place]) 04/30/2018  . Chemotherapy-induced neutropenia ([place]) 04/30/2018  . 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 MSSA bacteremia 03/08/2018  . Abscess of right hand 03/08/2018  . PUD (peptic ulcer disease)   . GI bleed 02/18/2018  . Cellulitis 02/10/2018  . Need for vaccination  with 13-polyvalent pneumococcal conjugate vaccine 12/10/2017  . Urine test positive for microalbuminuria 12/10/2017  . Anemia 05/02/2017  . Anemia associated with acute blood loss 03/09/2017  . Arthritis of left knee 12/10/2016  . Thrombocytopenia ([place]) 10/20/2016  . Postoperative hemorrhage involving digestive system following digestive system procedure 08/04/2016  . Rectal bleeding 07/31/2016  . 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 Asthma 08/17/2015  . Umbilical hernia without obstruction and without gangrene 02/27/2015  . 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 Routine general medical examination at a health care facility 03/15/2013  . 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 Shoulder pain 11/23/2012  . Right bundle branch block 11/11/2012  . Diabetes mellitus, controlled ([place])   . Hyperlipidemia   . Hypertension       Prior to Admission medications   Medication Sig Start Date End Date Taking? Authorizing Provider  ACCU-CHEK SMARTVIEW test strip USE AS INSTRUCTED TO CHECK  BLOOD GLUCOSE ONCE DAILY if desired; LON 99 months; Dx E11.9 12/17/17   [name], MD  acetaminophen (TYLENOL) 325 MG tablet Take 650 mg by mouth every 6 (six) hours as needed for mild pain, moderate pain or fever.    [provider]  ADVAIR DISKUS 100-50 MCG/DOSE AEPB USE 1 PUFF TWO TIMES DAILY Patient taking differently: Inhale 1 puff into the lungs 2 (two) times daily.  05/29/17   Roselee Nova, MD  amLODipine (NORVASC) 5 MG tablet Take 5 mg by mouth daily.    [provider]  carvedilol (COREG) 25 MG tablet Take 25 mg by mouth 2 (two) times daily.    [provider]  carvedilol (COREG) 6.25 MG tablet Take 1 tablet (6.25 mg total) by mouth 2 (two) times daily with a meal. Patient not taking: Reported on 06/14/2018 11/26/17   [name], MD  folic acid (FOLVITE) 1 MG tablet Take 1 mg by mouth daily.    [provider]  hydrOXYzine (ATARAX/VISTARIL) 25 MG tablet Take 25 mg by mouth every 6 (six) hours as needed for anxiety or itching.    [provider]  latanoprost (XALATAN) 0.005 % ophthalmic solution Place 1 drop at bedtime into the right eye.    [provider]  levofloxacin (LEVAQUIN) 500 MG tablet Take 500 mg by mouth daily. 06/08/18   [provider]  lidocaine (LIDODERM) 5 % Place 1 patch onto the skin daily. Remove & Discard patch within 12 hours or as directed by MD     [provider]  lisinopril (PRINIVIL,ZESTRIL) 10 MG tablet TAKE 1 TABLET BY MOUTH  DAILY Patient not taking: Reported on 06/14/2018 04/13/18   [name], MD  lisinopril (PRINIVIL,ZESTRIL) 20 MG tablet Take 20 mg by mouth daily.    [provider]  metFORMIN (GLUCOPHAGE) 850 MG tablet TAKE 1 TABLET BY MOUTH TWO  TIMES DAILY WITH A MEAL Patient taking differently: Take 850 mg by mouth 2 (two) times daily.  06/01/18   [name], MD  Multiple Vitamin (TAB-A-VITE) TABS Take 1 tablet by mouth daily.    [provider]  oxyCODONE-acetaminophen (PERCOCET/ROXICET) 5-325 MG tablet Take 1 tablet by mouth every 6 (six) hours as needed for severe pain.    [provider]  pantoprazole (PROTONIX) 40 MG tablet Take 1 tablet (40 mg total) by mouth 2 (two) times daily before a meal. 02/20/18   Salary, Avel Peace, MD  polyethylene glycol (  MIRALAX / GLYCOLAX) packet Take 17 g by mouth daily.    [provider]  simvastatin (ZOCOR) 20 MG tablet Take 1 tablet (20 mg total) by mouth at bedtime. 03/01/18   [name], MD  tranexamic acid (LYSTEDA) 650 MG TABS tablet Take 1,300 mg by mouth 2 (two) times daily.    [provider]  valACYclovir (VALTREX) 500 MG tablet Take 500 mg by mouth daily.    [provider]    Allergies Hydrocodone and Oxycodone    Social History Social History   Tobacco Use  . Smoking status: Never Smoker  . Smokeless tobacco: Never Used  . Tobacco comment: smoking cessation materials not required  Substance Use Topics  . Alcohol use: No  . Drug use: No    Review of Systems Patient denies headaches, rhinorrhea, blurry vision, numbness, shortness of breath, chest pain, edema, cough, abdominal pain, nausea, vomiting, diarrhea, dysuria, fevers, rashes or hallucinations unless otherwise stated above in HPI. ____________________________________________   PHYSICAL EXAM:  VITAL SIGNS: Vitals:   06/25/18 1525  06/25/18 1530  BP: 135/65 (!) 149/67  Pulse: 92 92  Resp: (!) 24 (!) 24  Temp: 98.1 F (36.7 C)   SpO2: 97% 97%    Constitutional: Alert and oriented.  Eyes: Conjunctivae are normal.  Head: Atraumatic. Nose: No congestion/rhinnorhea. Mouth/Throat: Mucous membranes are moist.   Neck: No stridor. Painless ROM.  Cardiovascular: Normal rate, regular rhythm. Grossly normal heart sounds.  Good peripheral circulation. Respiratory: Normal respiratory effort.  No retractions. Lungs CTAB. Gastrointestinal: Soft and nontender. No distention. No abdominal bruits. No CVA tenderness. Genitourinary:  Musculoskeletal: No lower extremity tenderness nor edema.  No joint effusions. Neurologic:  Normal speech and language. No gross focal neurologic deficits are appreciated. No facial droop Skin:  Skin is warm, dry and intact. No rash noted. Psychiatric: Mood and affect are normal. Speech and behavior are normal.  ____________________________________________   LABS (all labs ordered are listed, but only abnormal results are displayed)  Results for orders placed or performed during the hospital encounter of 06/25/18 (from the past 24 hour(s))  CBC with Differential     Status: Abnormal   Collection Time: 06/25/18 12:25 PM  Result Value Ref Range   WBC 46.6 (H) 4.0 - 10.5 K/uL   RBC 3.11 (L) 3.87 - 5.11 MIL/uL   Hemoglobin 9.2 (L) 12.0 - 15.0 g/dL   HCT 29.9 (L) 36.0 - 46.0 %   MCV 96.1 80.0 - 100.0 fL   MCH 29.6 26.0 - 34.0 pg   MCHC 30.8 30.0 - 36.0 g/dL   RDW 25.3 (H) 11.5 - 15.5 %   Platelets 16 (LL) 150 - 400 K/uL   nRBC 26.6 (H) 0.0 - 0.2 %   Neutrophils Relative % 3 %   Neutro Abs 1.4 (L) 1.7 - 7.7 K/uL   Lymphocytes Relative 14 %   Lymphs Abs 6.5 (H) 0.7 - 4.0 K/uL   Monocytes Relative 1 %   Monocytes Absolute 0.5 0.1 - 1.0 K/uL   Eosinophils Relative 0 %   Eosinophils Absolute 0.0 0.0 - 0.5 K/uL   Basophils Relative 0 %   Basophils Absolute 0.0 0.0 - 0.1 K/uL   Smear Review  PLATELETS APPEAR DECREASED    Blasts 82 %   Smudge Cells PRESENT    Polychromasia PRESENT   Comprehensive metabolic panel     Status: Abnormal   Collection Time: 06/25/18 12:25 PM  Result Value Ref Range   Sodium 129 (L)  135 - 145 mmol/L   Potassium 3.8 3.5 - 5.1 mmol/L   Chloride 95 (L) 98 - 111 mmol/L   CO2 26 22 - 32 mmol/L   Glucose, Bld 145 (H) 70 - 99 mg/dL   BUN 24 (H) 8 - 23 mg/dL   Creatinine, Ser 0.52 0.44 - 1.00 mg/dL   Calcium 8.5 (L) 8.9 - 10.3 mg/dL   Total Protein 7.9 6.5 - 8.1 g/dL   Albumin 3.1 (L) 3.5 - 5.0 g/dL   AST 32 15 - 41 U/L   ALT 17 0 - 44 U/L   Alkaline Phosphatase 95 38 - 126 U/L   Total Bilirubin 2.7 (H) 0.3 - 1.2 mg/dL   GFR calc non Af Amer >60 >60 mL/min   GFR calc Af Amer >60 >60 mL/min   Anion gap 8 5 - 15  Type and screen [facility]     Status: None   Collection Time: 06/25/18 12:25 PM  Result Value Ref Range   ABO/RH(D) A POS    Antibody Screen NEG    Sample Expiration      06/28/2018 Performed at [facility], [address], [place], [place] [address]   Prepare Pheresed Platelets     Status: None (Preliminary result)   Collection Time: 06/25/18  1:31 PM  Result Value Ref Range   Unit Number [id number]    Blood Component Type PLTP LI1 PAS    Unit division 00    Status of Unit ISSUED    Transfusion Status      OK TO TRANSFUSE Performed at [facility], [address] [place],  [address]    ____________________________________________ ____________________________________________  RADIOLOGY   ____________________________________________   PROCEDURES  Procedure(s) performed:  Procedures    Critical Care performed: no ____________________________________________   INITIAL IMPRESSION / ASSESSMENT AND PLAN / ED COURSE  Pertinent labs & imaging results that were available during my care of the patient were reviewed by me and considered in my medical decision making  (see chart for details).   DDX: Trauma cytopenia, leukemia, anemia  SHAYANNA THATCH is a 77 y.o. who presents to the ED with symptoms as described above.  Patient here for transfusion of platelets.  No evidence of active spontaneous bleeding.  Blood work otherwise at baseline.  Will transfuse touch base with oncology.  Clinical Course as of Jun 26 1555  Fri Jun 25, 2018  1459 Discussed case with oncology.  Plan is for goal platelets above 30.  Will repeat platelet 30 minutes posttransfusion.   [PR]    Clinical Course User Index [PR] Merlyn Lot, MD     As part of my medical decision making, I reviewed the following data within the [place] notes reviewed and incorporated, Labs reviewed, notes from prior ED visits.  ____________________________________________   FINAL CLINICAL IMPRESSION(S) / ED DIAGNOSES  Final diagnoses:  Thrombocytopenia ([place])      NEW MEDICATIONS STARTED DURING THIS VISIT:  New Prescriptions   No medications on file     Note:  This document was prepared using Dragon voice recognition software and may include unintentional dictation errors.    Merlyn Lot, MD 06/25/18 [phone]

## 2018-06-25 NOTE — ED Notes (Signed)
Date and time results received: 06/25/18 1:22 PM  (use smartphrase ".now" to insert current time)  Test: Plateletts Critical Value: 16  Name of Provider Notified: Dr. Quentin Cornwall  Orders Received? Or Actions Taken?: Critical Results Acknowledged

## 2018-06-25 NOTE — Progress Notes (Signed)
Julie Holland is a 77 y.o. 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   HPI:  The patient was last seen in the hematology clinic on 05/24/2018.  At that time, patient was experienced market swelling and bruising to her left upper and lower extremities.  She denied chest pain or shortness of breath.  No B symptoms or interval infections.  No bleeding.  Was spending most of her day "resting".  Eating well; weight up 3 pounds.  Exam revealed ecchymosis and swelling to the left upper and lower extremities that have progressed since 05/21/2018 visit.  Additionally, she had extensive sacral ecchymosis extending circumferentially around to her abdomen.  She was mildly jaundiced.  Hemoglobin had dropped to 6.2 (previously 10.9).  Weight was 35,000.  ANC low at 600.  Patient was admitted to the inpatient oncology unit from 05/24/2018 - 05/27/2018.  Notes from hospital course reviewed.  Venous doppler of the left lower extremity was negative for DVT.  CT imaging of the abdomen and pelvis demonstrated enlargement of the left gluteus maximus muscle, and felt to be consistent with progressive hemorrhage or myositis.  There was no evidence of retroperitoneal hematoma or hemoperitoneum.  Patient was seen in consult by orthopedics Rudene Christians, MD) who felt that edema was related to a spontaneous hemorrhage in both upper and lower extremities elated to a trauma (possible muscle tears).  Trial of compression was recommended, however it proved to be ineffective in improving patient's symptoms.  Patient received a total of 5 units of PRBCs and 1 unit of platelets during her admission.  CBC on 05/27/2018 revealed a WBC of 13,000.  Hemoglobin 7.8, hematocrit 22.5, MCV 89.5, and platelets 41,000.  Patient was transferred to [facility] for continuing care and further evaluation.  Patient was admitted to [facility] from 05/27/2018 -06/08/2018.   Notes from hospital course reviewed.  CT imaging of the left lower extremity revealed a 10.4 x 5.4 cm posterior compartment intramuscular hematoma.  PICC line was placed, and patient received further transfusional support (7 units PRBCs and 4 units platelets).  She was treated with oral TXA 1300 mg twice daily.  Abdominal ultrasound revealed diffuse moderate intrahepatic biliary dilatation with no definite filling defects identified.  Findings compatible with chronic liver disease/cirrhosis.  Pre-discharge CBC on 06/08/2018 revealed a WBC of 7300.  Hemoglobin 7.7, hematocrit 24.9, MCV 90.9, and platelets 48,000.  Blast count was 36%.  CBC on 06/09/2018 revealed a WBC of 7600.  Hemoglobin 8.5, hematocrit 25.2, and platelets 46,000. CBC on 06/11/2018 revealed a WBC of 2800.  Hemoglobin 7.7, hematocrit 22.9, MCV 90.2, and platelets 26,000.  Patient was sent to the ED, from [facility], on 06/11/2018 where she was evaluated by Dr. Lavonia Drafts.  Notes reviewed.  Patient had no complaints.  Exam was reassuring.  CBC revealed a WBC of 13,800.  Hemoglobin 7.6, hematocrit 23.7, MCV 89.4, and platelets 28,000.  Sodium low at 130.  Calcium low at 8.2.  Albumin low at 2.5.  Total bilirubin elevated at 2.6.  Dr. Grayland Ormond was consulted via phone, and ED provider was advised to discharge patient back to SNF, with plans to follow-up as an outpatient in the oncology clinic.  CBC on 06/14/2018, done at [facility], revealed a WBC of 15,400.  Hemoglobin 8.4, hematocrit 26.2, MCV 92.2, and platelets 17,000.  Sodium had improved to 133.  Potassium low at 3.3.  Calcium low at 8.4.  Patient  did not come in for her scheduled infusion appointment.  Patient was sent back to the ED on 06/14/2018 where she was seen by Dr. Harvest Dark.  Notes reviewed.  Patient had no complaints.  She was transfused with 1 unit of irradiated random pheresed platelets.  1 hour post infusion platelet count had improved to 46,000.  Patient was discharged back  to her SNF with plans to follow-up as an outpatient with oncology.  CBC on 06/16/2018 revealed a WBC of 33,300 (ANC 1300).  Hemoglobin 8.9, hematocrit 30.2, MCV 96.2, and platelets 35,000.  Sodium low at 131.  Glucose elevated at 219.  Calcium had improved to 9.0.  Counts were deemed to be stable.  And patient was discharged back to SNF with plans for continued routine monitoring.  CBC on 06/18/2018 revealed a WBC of 35,200 (ANC 1400).  Hemoglobin 9.1, hematocrit 30.7, MCV 97.8, and platelets 21,000.  Patient received 1 unit of irradiated random pheresed platelets.  She did not return as scheduled on 06/21/2018 for routine lab monitoring and transfusion support.   CBC on 06/23/2018 revealed a WBC of 42,500 (ANC 900).  Hemoglobin 9.4, hematocrit 31.2, MCV 96.0, and platelets 19,000.  Patient received 1 unit of irradiated red pheresis platelets.  During her infusion, patient noted have declined from her PLOF by infusion staff.  Called placed to clinical team to advise that patient unable to independently complete ADLs.  She was experiencing diarrhea and had soiled her clothing several times in the infusion center.  Patient was deemed to be total care by clinical staff, thus unable to be cared for further in the busy outpatient infusion center. She completed her platelet transfusion and was discharged back to her SNF. Orders were faxed to SNF for stool studies to assess for diarrhea of infectious etiology.  GI panel and C. difficile by PCR completed on 06/24/2018 revealing negative results.  In the interim,    Past Medical History:  Diagnosis Date  . Arthritis   . Asthma   . Blood transfusion without reported diagnosis   . 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  . Cataract   . Chronic kidney disease    stage 1  . Diabetes mellitus 2007   type II  . Dry eye of right side   . GERD (gastroesophageal reflux disease)    OCC  .  Glaucoma 2018   RIGHT EYE   . Hemorrhoids   . Hyperlipidemia   . Hypertension     Past Surgical History:  Procedure Laterality Date  . CATARACT EXTRACTION, BILATERAL    . CHOLECYSTECTOMY    . COLONOSCOPY  02/13/13   Dr Bary Castilla  . EPIGASTRIC HERNIA REPAIR N/A 03/20/2015   Procedure: HERNIA REPAIR EPIGASTRIC ADULT;  Surgeon: Robert Bellow, MD;  Location: [facility] ORS;  Service: General;  Laterality: N/A;  . ESOPHAGOGASTRODUODENOSCOPY N/A 02/19/2018   Procedure: ESOPHAGOGASTRODUODENOSCOPY (EGD);  Surgeon: Lin Landsman, MD;  Location: [facility] ENDOSCOPY;  Service: Gastroenterology;  Laterality: N/A;  . HEMORRHOID SURGERY N/A 03/20/2015    FOCAL HIGH-GRADE SQUAMOUS INTRAEPITHELIAL LESION (HSIL, ANAL /HEMORRHOIDECTOMY;   Robert Bellow, MD [facility] ORS;  : General;  Laterality: N/A;  . HERNIA REPAIR  July 2016   Epigastric hernia, primary repair  . INCISION AND DRAINAGE Right 02/11/2018   Procedure: INCISION AND DRAINAGE- RIGHT HAND;  Surgeon: Hessie Knows, MD;  Location: [facility] ORS;  Service: Orthopedics;  Laterality: Right;  . TONSILLECTOMY  age 34  .  TOTAL ABDOMINAL HYSTERECTOMY  01/1989  . TUBAL LIGATION    . 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  Case has been [facility] the  [facility] tumor [facility]. No indication for additional treatment outside of serial exams  Surgeon: Robert Bellow, MD;  Location: [facility] ORS;  Service: General;  Laterality: N/A;    Family History  Problem Relation Age of Onset  . Bladder Cancer Mother   . Colon cancer Father   . Lung cancer Brother   . Lymphoma Sister   . Breast cancer Neg Hx     Social History:  reports that she has never smoked. She has never used smokeless tobacco. She reports that she does not drink alcohol or use drugs.  She lives in [place] with her husband.  She denies any exposure to radiations or toxins.  Her husband's name is [name].  The patient is  accompanied by her husband today.  Allergies:  Allergies  Allergen Reactions  . Hydrocodone Nausea And Vomiting and Other (See Comments)    Vertigo but patient takes hydrocodone/acetaminophen outpatient  . Oxycodone Other (See Comments)    Unknown reaction    Current Medications: Current Outpatient Medications  Medication Sig Dispense Refill  . ACCU-CHEK SMARTVIEW test strip USE AS INSTRUCTED TO CHECK  BLOOD GLUCOSE ONCE DAILY if desired; LON 99 months; Dx E11.9 100 each 1  . acetaminophen (TYLENOL) 325 MG tablet Take 650 mg by mouth every 6 (six) hours as needed for mild pain, moderate pain or fever.    Marland Kitchen ADVAIR DISKUS 100-50 MCG/DOSE AEPB USE 1 PUFF TWO TIMES DAILY (Patient taking differently: Inhale 1 puff into the lungs 2 (two) times daily. ) 3 each 2  . amLODipine (NORVASC) 5 MG tablet Take 5 mg by mouth daily.    . carvedilol (COREG) 25 MG tablet Take 25 mg by mouth 2 (two) times daily.    . carvedilol (COREG) 6.25 MG tablet Take 1 tablet (6.25 mg total) by mouth 2 (two) times daily with a meal. (Patient not taking: Reported on 06/14/2018) 748 tablet 1  . folic acid (FOLVITE) 1 MG tablet Take 1 mg by mouth daily.    . hydrOXYzine (ATARAX/VISTARIL) 25 MG tablet Take 25 mg by mouth every 6 (six) hours as needed for anxiety or itching.    . latanoprost (XALATAN) 0.005 % ophthalmic solution Place 1 drop at bedtime into the right eye.    Marland Kitchen levofloxacin (LEVAQUIN) 500 MG tablet Take 500 mg by mouth daily.    Marland Kitchen lidocaine (LIDODERM) 5 % Place 1 patch onto the skin daily. Remove & Discard patch within 12 hours or as directed by MD    . lisinopril (PRINIVIL,ZESTRIL) 10 MG tablet TAKE 1 TABLET BY MOUTH  DAILY (Patient not taking: Reported on 06/14/2018) 90 tablet 1  . lisinopril (PRINIVIL,ZESTRIL) 20 MG tablet Take 20 mg by mouth daily.    . metFORMIN (GLUCOPHAGE) 850 MG tablet TAKE 1 TABLET BY MOUTH TWO  TIMES DAILY WITH A MEAL (Patient taking differently: Take 850 mg by mouth 2 (two) times  daily. ) 180 tablet 0  . Multiple Vitamin (TAB-A-VITE) TABS Take 1 tablet by mouth daily.    Marland Kitchen oxyCODONE-acetaminophen (PERCOCET/ROXICET) 5-325 MG tablet Take 1 tablet by mouth every 6 (six) hours as needed for severe pain.    . pantoprazole (PROTONIX) 40 MG tablet Take 1 tablet (40 mg total) by mouth 2 (two) times daily  before a meal. 180 tablet 0  . polyethylene glycol (MIRALAX / GLYCOLAX) packet Take 17 g by mouth daily.    . simvastatin (ZOCOR) 20 MG tablet Take 1 tablet (20 mg total) by mouth at bedtime. 90 tablet 0  . tranexamic acid (LYSTEDA) 650 MG TABS tablet Take 1,300 mg by mouth 2 (two) times daily.    . valACYclovir (VALTREX) 500 MG tablet Take 500 mg by mouth daily.     No current facility-administered medications for this visit.    Facility-Administered Medications Ordered in Other Visits  Medication Dose Route Frequency Provider Last Rate Last Dose  . sodium chloride flush (NS) 0.9 % injection 10 mL  10 mL Intracatheter PRN Corcoran, Melissa C, MD      . sodium chloride flush (NS) 0.9 % injection 3 mL  3 mL Intracatheter PRN Lequita Asal, MD        Review of Systems  Constitutional: Positive for malaise/fatigue. Negative for diaphoresis, fever and weight loss (weight up 3 pounds).  HENT: Negative.   Eyes: Negative.        Scleral icterus  Respiratory: Negative for cough, hemoptysis, sputum production and shortness of breath.   Cardiovascular: Positive for leg swelling (LEFT). Negative for chest pain, palpitations, orthopnea and PND.  Gastrointestinal: Negative for abdominal pain, blood in stool, constipation, diarrhea, melena, nausea and vomiting.  Genitourinary: Negative for dysuria, frequency, hematuria and urgency.  Musculoskeletal: Negative for back pain, falls, joint pain and myalgias.       Left upper extremity achy, no better than 05/21/2018.  Skin: Negative for itching and rash.       Jaundice  Neurological: Positive for weakness (generalized). Negative  for dizziness, tremors and headaches.  Endo/Heme/Allergies: Bruises/bleeds easily.       Diabetes  Psychiatric/Behavioral: Negative for depression, memory loss and suicidal ideas. The patient is not nervous/anxious and does not have insomnia.   All other systems reviewed and are negative.  Performance status (ECOG): 2 - Symptomatic, <50% confined to bed  Vital signs: There were no vitals taken for this visit.  Physical Exam  Constitutional: She is oriented to person, place, and time and well-developed, well-nourished, and in no distress.  HENT:  Head: Normocephalic and atraumatic.  Short Emmalynn Pinkham hair.  Eyes: Pupils are equal, round, and reactive to light. EOM are normal. Scleral icterus is present.  Brown eyes  Neck: Normal range of motion. Neck supple. No tracheal deviation present. No thyromegaly present.  Cardiovascular: Normal rate, regular rhythm and normal heart sounds. Exam reveals decreased pulses (LEFT upper extremity). Exam reveals no gallop and no friction rub.  No murmur heard. Pulmonary/Chest: Effort normal and breath sounds normal. No respiratory distress. She has no wheezes. She has no rales.  Abdominal: Soft. Bowel sounds are normal. She exhibits no distension. There is no tenderness.  Musculoskeletal: Normal range of motion. She exhibits edema (LEFT upper and lower extremities).       Left hand: She exhibits tenderness ("achy") and swelling.       Left lower leg: She exhibits tenderness (bruising from groin extending distally) and swelling.  Left hand markedly edematous, purple, and slightly cool.  Lymphadenopathy:    She has no cervical adenopathy.    She has no axillary adenopathy.       Right: No inguinal and no supraclavicular adenopathy present.       Left: No inguinal and no supraclavicular adenopathy present.  Neurological: She is alert and oriented to person, place, and time.  Skin: Skin is warm and dry. Ecchymosis (LEFT upper and lower extremities) noted. No  rash noted. No erythema.  Jaundice. Marked ecchymosis to abdomen extending circumferentially to sacral area.   Psychiatric: Mood, affect and judgment normal.  Tearful at times.  Nursing note and vitals reviewed.   Admission on 06/25/2018, Discharged on 06/25/2018  Component Date Value Ref Range Status  . WBC 06/25/2018 46.6* 4.0 - 10.5 K/uL Final  . RBC 06/25/2018 3.11* 3.87 - 5.11 MIL/uL Final  . Hemoglobin 06/25/2018 9.2* 12.0 - 15.0 g/dL Final  . HCT 06/25/2018 29.9* 36.0 - 46.0 % Final  . MCV 06/25/2018 96.1  80.0 - 100.0 fL Final  . MCH 06/25/2018 29.6  26.0 - 34.0 pg Final  . MCHC 06/25/2018 30.8  30.0 - 36.0 g/dL Final  . RDW 06/25/2018 25.3* 11.5 - 15.5 % Final  . Platelets 06/25/2018 16* 150 - 400 K/uL Final   Comment: Immature Platelet Fraction may be clinically indicated, consider ordering this additional test [id number] THIS RESULT HAS BEEN CALLED TO JESSICA COLTRANE BY SUSAN RHEW ON 10 18 2019 AT 8032, AND HAS BEEN READ BACK.    Marland Kitchen nRBC 06/25/2018 26.6* 0.0 - 0.2 % Final  . Neutrophils Relative % 06/25/2018 3  % Final  . Neutro Abs 06/25/2018 1.4* 1.7 - 7.7 K/uL Final  . Lymphocytes Relative 06/25/2018 14  % Final  . Lymphs Abs 06/25/2018 6.5* 0.7 - 4.0 K/uL Final  . Monocytes Relative 06/25/2018 1  % Final  . Monocytes Absolute 06/25/2018 0.5  0.1 - 1.0 K/uL Final  . Eosinophils Relative 06/25/2018 0  % Final  . Eosinophils Absolute 06/25/2018 0.0  0.0 - 0.5 K/uL Final  . Basophils Relative 06/25/2018 0  % Final  . Basophils Absolute 06/25/2018 0.0  0.0 - 0.1 K/uL Final  . Smear Review 06/25/2018 PLATELETS APPEAR DECREASED   Final  . Blasts 06/25/2018 82  % Final  . Smudge Cells 06/25/2018 PRESENT   Final  . Polychromasia 06/25/2018 PRESENT   Final   Performed at [facility], [address], [place], [place] [address]  . Sodium 06/25/2018 129* 135 - 145 mmol/L Final  . Potassium 06/25/2018 3.8  3.5 - 5.1 mmol/L Final  . Chloride 06/25/2018 95* 98 - 111  mmol/L Final  . CO2 06/25/2018 26  22 - 32 mmol/L Final  . Glucose, Bld 06/25/2018 145* 70 - 99 mg/dL Final  . BUN 06/25/2018 24* 8 - 23 mg/dL Final  . Creatinine, Ser 06/25/2018 0.52  0.44 - 1.00 mg/dL Final  . Calcium 06/25/2018 8.5* 8.9 - 10.3 mg/dL Final  . Total Protein 06/25/2018 7.9  6.5 - 8.1 g/dL Final  . Albumin 06/25/2018 3.1* 3.5 - 5.0 g/dL Final  . AST 06/25/2018 32  15 - 41 U/L Final  . ALT 06/25/2018 17  0 - 44 U/L Final  . Alkaline Phosphatase 06/25/2018 95  38 - 126 U/L Final  . Total Bilirubin 06/25/2018 2.7* 0.3 - 1.2 mg/dL Final  . GFR calc non Af Amer 06/25/2018 >60  >60 mL/min Final  . GFR calc Af Amer 06/25/2018 >60  >60 mL/min Final   Comment: (NOTE) The eGFR has been calculated using the CKD EPI equation. This calculation has not been validated in all clinical situations. eGFR's persistently <60 mL/min signify possible Chronic Kidney Disease.   Georgiann Hahn gap 06/25/2018 8  5 - 15 Final   Performed at [facility], [address] [place], [place] [address]  . ABO/RH(D)  06/25/2018 A POS   Final  . Antibody Screen 06/25/2018 NEG   Final  . Sample Expiration 06/25/2018    Final                   Value:06/28/2018 Performed at [facility], [address], [place], [place] [address]   . Unit Number 06/25/2018 [id number]   Final  . Blood Component Type 06/25/2018 PLTP LI1 PAS   Final  . Unit division 06/25/2018 00   Final  . Status of Unit 06/25/2018 ISSUED,FINAL   Final  . Transfusion Status 06/25/2018    Final                   Value:OK TO TRANSFUSE Performed at [facility], [address] [place], [place] [address]   . ISSUE DATE / TIME 06/25/2018 [id number]   Final  . Blood Product Unit Number 06/25/2018 [id number]   Final  . PRODUCT CODE 06/25/2018 E7005V00   Final  . Unit Type and Rh 06/25/2018 6200   Final  . Blood Product Expiration Date 06/25/2018 [id number]   Final  . Platelets 06/25/2018 34* 150 - 400 K/uL  Final   Comment: PLATELET COUNT CONFIRMED BY SMEAR Immature Platelet Fraction may be clinically indicated, consider ordering this additional test [id number] Performed at [facility], [address] [place], [place] [address]   Orders Only on 06/25/2018  Component Date Value Ref Range Status  . WBC 06/25/2018 46.6* 4.0 - 10.5 K/uL Final  . RBC 06/25/2018 3.23* 3.87 - 5.11 MIL/uL Final  . Hemoglobin 06/25/2018 9.3* 12.0 - 15.0 g/dL Final  . HCT 06/25/2018 30.7* 36.0 - 46.0 % Final  . MCV 06/25/2018 95.0  80.0 - 100.0 fL Final  . MCH 06/25/2018 28.8  26.0 - 34.0 pg Final  . MCHC 06/25/2018 30.3  30.0 - 36.0 g/dL Final  . RDW 06/25/2018 25.3* 11.5 - 15.5 % Final  . Platelets 06/25/2018 19* 150 - 400 K/uL Final   Comment: REPEATED TO VERIFY PLATELET COUNT CONFIRMED BY SMEAR SPECIMEN CHECKED FOR CLOTS CRITICAL VALUE NOTED.  VALUE IS CONSISTENT WITH PREVIOUSLY REPORTED AND CALLED VALUE.   Marland Kitchen nRBC 06/25/2018 26.7* 0.0 - 0.2 % Final  . Neutrophils Relative % 06/25/2018 4  % Final  . Neutro Abs 06/25/2018 2.3  1.7 - 7.7 K/uL Final  . Lymphocytes Relative 06/25/2018 6  % Final  . Lymphs Abs 06/25/2018 2.8  0.7 - 4.0 K/uL Final  . Monocytes Relative 06/25/2018 6  % Final  . Monocytes Absolute 06/25/2018 2.8* 0.1 - 1.0 K/uL Final  . Eosinophils Relative 06/25/2018 0  % Final  . Eosinophils Absolute 06/25/2018 0.0  0.0 - 0.5 K/uL Final  . Basophils Relative 06/25/2018 0  % Final  . Basophils Absolute 06/25/2018 0.0  0.0 - 0.1 K/uL Final  . WBC Morphology 06/25/2018 Abnormal lymphocytes present   Final  . Smear Review 06/25/2018 Normal platelet morphology   Final   Comment: PLATELETS APPEAR DECREASED PLATELET COUNT CONFIRMED BY SMEAR   . Metamyelocytes Relative 06/25/2018 1  % Final  . Blasts 06/25/2018 83  % Final  . Smudge Cells 06/25/2018 PRESENT   Final  . Polychromasia 06/25/2018 PRESENT   Final  . Platelet Morphology 06/25/2018 NORMAL   Final   Performed at [facility], [address], [place], [place] [address]  Clinical Support on 06/25/2018  Component Date Value Ref Range Status  . Blood Bank Specimen 06/25/2018 SAMPLE AVAILABLE FOR TESTING  Final  . Sample Expiration 06/25/2018    Final                   Value:06/28/2018 Performed at [facility], [address], [place], [place] [address]   . Sodium 06/25/2018 128* 135 - 145 mmol/L Final  . Potassium 06/25/2018 3.7  3.5 - 5.1 mmol/L Final  . Chloride 06/25/2018 92* 98 - 111 mmol/L Final  . CO2 06/25/2018 26  22 - 32 mmol/L Final  . Glucose, Bld 06/25/2018 223* 70 - 99 mg/dL Final  . BUN 06/25/2018 23  8 - 23 mg/dL Final  . Creatinine, Ser 06/25/2018 0.59  0.44 - 1.00 mg/dL Final  . Calcium 06/25/2018 8.8* 8.9 - 10.3 mg/dL Final  . GFR calc non Af Amer 06/25/2018 >60  >60 mL/min Final  . GFR calc Af Amer 06/25/2018 >60  >60 mL/min Final   Comment: (NOTE) The eGFR has been calculated using the CKD EPI equation. This calculation has not been validated in all clinical situations. eGFR's persistently <60 mL/min signify possible Chronic Kidney Disease.   Georgiann Hahn gap 06/25/2018 10  5 - 15 Final   Performed at [facility], [address], [place], [place] [address]  Hospital Outpatient Visit on 06/24/2018  Component Date Value Ref Range Status  . Campylobacter species 06/24/2018 NOT DETECTED  NOT DETECTED Final  . Plesimonas shigelloides 06/24/2018 NOT DETECTED  NOT DETECTED Final  . Salmonella species 06/24/2018 NOT DETECTED  NOT DETECTED Final  . Yersinia enterocolitica 06/24/2018 NOT DETECTED  NOT DETECTED Final  . Vibrio species 06/24/2018 NOT DETECTED  NOT DETECTED Final  . Vibrio cholerae 06/24/2018 NOT DETECTED  NOT DETECTED Final  . Enteroaggregative E coli (EAEC) 06/24/2018 NOT DETECTED  NOT DETECTED Final  . Enteropathogenic E coli (EPEC) 06/24/2018 NOT DETECTED  NOT DETECTED Final  . Enterotoxigenic E coli (ETEC) 06/24/2018 NOT DETECTED  NOT DETECTED Final  .  Shiga like toxin producing E coli * 06/24/2018 NOT DETECTED  NOT DETECTED Final  . Shigella/Enteroinvasive E coli (EI* 06/24/2018 NOT DETECTED  NOT DETECTED Final  . Cryptosporidium 06/24/2018 NOT DETECTED  NOT DETECTED Final  . Cyclospora cayetanensis 06/24/2018 NOT DETECTED  NOT DETECTED Final  . Entamoeba histolytica 06/24/2018 NOT DETECTED  NOT DETECTED Final  . Giardia lamblia 06/24/2018 NOT DETECTED  NOT DETECTED Final  . Adenovirus F40/41 06/24/2018 NOT DETECTED  NOT DETECTED Final  . Astrovirus 06/24/2018 NOT DETECTED  NOT DETECTED Final  . Norovirus GI/GII 06/24/2018 NOT DETECTED  NOT DETECTED Final  . Rotavirus A 06/24/2018 NOT DETECTED  NOT DETECTED Final  . Sapovirus (I, II, IV, and V) 06/24/2018 NOT DETECTED  NOT DETECTED Final   Performed at [facility], [address] [place], [place] [address]  . C Diff antigen 06/24/2018 NEGATIVE  NEGATIVE Final  . C Diff toxin 06/24/2018 NEGATIVE  NEGATIVE Final  . C Diff interpretation 06/24/2018 No C. difficile detected.   Final   Performed at [facility], [address], [place], [place] [address]  Infusion on 06/23/2018  Component Date Value Ref Range Status  . Unit Number 06/23/2018 [id number]   Final  . Blood Component Type 06/23/2018 PLTP LI1 PAS   Final  . Unit division 06/23/2018 00   Final  . Status of Unit 06/23/2018 ISSUED,FINAL   Final  . Unit tag comment 06/23/2018 IRRADIATED PRODUCT   Final  . Transfusion Status 06/23/2018    Final  Value:OK TO TRANSFUSE Performed at [facility], [address] [place], [place] [address]   . ISSUE DATE / TIME 06/23/2018 [id number]   Final  . Blood Product Unit Number 06/23/2018 [id number]   Final  . PRODUCT CODE 06/23/2018 E7006V00   Final  . Unit Type and Rh 06/23/2018 7300   Final  . Blood Product Expiration Date 06/23/2018 [id number]   Final  Appointment on 06/23/2018  Component Date Value Ref Range Status  . Sodium  06/23/2018 131* 135 - 145 mmol/L Final  . Potassium 06/23/2018 4.0  3.5 - 5.1 mmol/L Final  . Chloride 06/23/2018 94* 98 - 111 mmol/L Final  . CO2 06/23/2018 26  22 - 32 mmol/L Final  . Glucose, Bld 06/23/2018 228* 70 - 99 mg/dL Final  . BUN 06/23/2018 17  8 - 23 mg/dL Final  . Creatinine, Ser 06/23/2018 0.57  0.44 - 1.00 mg/dL Final  . Calcium 06/23/2018 8.9  8.9 - 10.3 mg/dL Final  . GFR calc non Af Amer 06/23/2018 >60  >60 mL/min Final  . GFR calc Af Amer 06/23/2018 >60  >60 mL/min Final   Comment: (NOTE) The eGFR has been calculated using the CKD EPI equation. This calculation has not been validated in all clinical situations. eGFR's persistently <60 mL/min signify possible Chronic Kidney Disease.   Georgiann [place] gap 06/23/2018 11  5 - 15 Final   Performed at [facility], [address] [place], [place] [address]  . WBC 06/23/2018 42.5* 4.0 - 10.5 K/uL Final  . RBC 06/23/2018 3.25* 3.87 - 5.11 MIL/uL Final  . Hemoglobin 06/23/2018 9.4* 12.0 - 15.0 g/dL Final  . HCT 06/23/2018 31.2* 36.0 - 46.0 % Final  . MCV 06/23/2018 96.0  80.0 - 100.0 fL Final  . MCH 06/23/2018 28.9  26.0 - 34.0 pg Final  . MCHC 06/23/2018 30.1  30.0 - 36.0 g/dL Final  . RDW 06/23/2018 25.6* 11.5 - 15.5 % Final  . Platelets 06/23/2018 19* 150 - 400 K/uL Final   Comment: REPEATED TO VERIFY PLATELET COUNT CONFIRMED BY SMEAR SPECIMEN CHECKED FOR CLOTS THIS RESULT HAS BEEN CALLED TO Lowell Makara GRAYANITA BLACK BY LONNIE RHONE ON 10 16 2019 AT [place], AND HAS BEEN READ BACK.    Marland Kitchen nRBC 06/23/2018 37.9* 0.0 - 0.2 % Final  . Neutrophils Relative % 06/23/2018 2  % Final  . Neutro Abs 06/23/2018 0.9* 1.7 - 7.7 K/uL Final  . Lymphocytes Relative 06/23/2018 30  % Final  . Lymphs Abs 06/23/2018 12.8* 0.7 - 4.0 K/uL Final  . Monocytes Relative 06/23/2018 4  % Final  . Monocytes Absolute 06/23/2018 1.7* 0.1 - 1.0 K/uL Final  . Eosinophils Relative 06/23/2018 0  % Final  . Eosinophils Absolute 06/23/2018 0.0  0.0 - 0.5 K/uL  Final  . Basophils Relative 06/23/2018 0  % Final  . Basophils Absolute 06/23/2018 0.0  0.0 - 0.1 K/uL Final  . Blasts 06/23/2018 64  % Final  . Smudge Cells 06/23/2018 PRESENT   Final  . Polychromasia 06/23/2018 PRESENT   Final  . Platelet Morphology 06/23/2018 NORMAL   Final   Performed at [facility], [address], [place], [place] [address]  . Blood Bank Specimen 06/23/2018 SAMPLE AVAILABLE FOR TESTING   Final  . Sample Expiration 06/23/2018    Final                   Value:06/26/2018 Performed at [facility], [address] [place], [place] [address]  Assessment:  Julie Holland is a 77 y.o. 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 Bone marrow biopsy on 04/19/2018 was consistent with AML. Cytogenetics were normal (19, XX) on first 10 metaphases. MLL FISH revealed no MLL rearrangement. BEAT AML trial was offered and declined.   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   She has a history of rectal bleedingrequiring 2 units of PRBCs on 07/31/2016. She underwent oversewing of a bleeding arterial vessel at the site of previous mucosal resection.   [facility] with MSSA sepsisin the setting of right wrist abscess after a fall and injury. TEE was negative.She underwent incision and drainageandwas treated with IV cefazolin x 6 weeks.[facility] no evidence for soft tissue swelling or drainable fluid collection, suggesting pain wassecondary to TFCC tear/degeneration.  She was admitted to [facility] 02/18/2018 - 02/20/2018 with a GIbleed. She received 2 units of PRBCs. EGD revealed gastric ulcers which were non-bleeding and gastritis.   She is transfusion dependent.  Blood  products are leukopoor and irradiated.  Peripheral blasts were 28% on 05/07/2018, 57% on 05/10/2018, 60% on 05/12/2018, 65% on 05/14/2018, and 71% on 05/21/2018.   She has mild hyperbilirubinemia of unclear etiology.  Bilirubin was 1.7 on 05/07/2018, 2.8 on 05/14/2018, and 3.4 on 05/21/2018.  Bilirubin is predominantly indirect.  Symptomatically,  she remains fatigued. There has been progressive swelling to her LEFT upper and lower extremities. Patient describes areas as "achy". She denies any fever.   Plan:  [id number] 1. Labs today: CBC with diff, CMP, Mg, hold tube, DAT, haptoglobin 2. AML  Blast count remains elevated.  Continue venetoclax  Labs reviewed. Blood counts have unexplainably dropped. Will postpone cycle #2 azacitadine secondary to progressive changes in left arm and leg.  Discuss admission to inpatient unit for further evaluation of acute symptoms. Spoke with Dr. Bridgett Larsson (hospitalist) who agrees to admit patient to medicine. Patient to 1C (107).  3. Pancytopenia  Hemoglobin 6.2, hematocrit 19.2, and platelets 35,000.   GI blood loss vs. progressive disease vs. bleeding into soft tissues.   ANC 600. Reinforce neutropenic precautions.   Will transfuse 2 units of irradiated PRBCs and admit to inpatient unit for continued monitoring.  4. Hyperbilirubinemia  Total bilirubin has improved to 3.0. Patient remains jaundiced.   Etiology unknown.  Possibly due to hemolysis or breakdown of RBCs in tissues.  Posaconazole on hold at this point.  5. LEFT upper and lower extremity ecchymosis and edema  Evaluated by vascular (Dr. Lucky Cowboy) and found not have no evidence of vascular insufficiency or DVT.  Felt to be related to minor trauma or previous PIV placement.   Spoke with Dr. Rudene Christians (orthopedics) who agrees to see the patient in consult as an inpatient. 6. Anticipate initiation of cycle #2 azacitadine in the outpatient department in next few days. 7. RTC on Monday,  Wednesdays, and Fridays for labs (CBC with diff, hold tube), and +/- PRBCs and platelets.    A total of (>40) minutes of face-to-face time was spent with the patient with greater than 50% of that time in counseling and care-coordination.  I had a long talk with the patient and her husband.  He expressed frustration over me "finding things wrong with her" and "needing time as a couple".  I reinforced the severity of her illness and the complications  to date.  She requires close management secondary to her illness.  Time away from the clinic or hospital at this time would be detrimental to her health.  She stated that she wished to have treatment and declined supportive care/Hospice.  I spoke with Bridgett Larsson, hospitalist, regarding admission.  I spoke with Dr.Menz regarding her past history of right wrist infection (known to him) and current issues with marked swelling of the left upper (including hand) and lower extremity, drop in hemoglobin, and concern for possible bleed into soft tissues.    Honor Loh, NP  06/25/2018, 6:31 PM   I saw and evaluated the patient, participating in the key portions of the service and reviewing pertinent diagnostic studies and records.  I reviewed the nurse practitioner's note and agree with the findings and the plan.  The assessment and plan were discussed with the patient.  Multiple questions were asked by the patient and answered.   Nolon Stalls, MD 06/25/2018,6:31 PM

## 2018-06-27 LAB — PREPARE PLATELET PHERESIS: Unit division: 0

## 2018-06-27 LAB — BPAM PLATELET PHERESIS
Blood Product Expiration Date: 201910192359
ISSUE DATE / TIME: 201910181450
Unit Type and Rh: 6200

## 2018-06-28 ENCOUNTER — Inpatient Hospital Stay: Payer: Medicare Other

## 2018-06-29 ENCOUNTER — Non-Acute Institutional Stay (SKILLED_NURSING_FACILITY): Payer: Medicare Other | Admitting: Adult Health

## 2018-06-29 ENCOUNTER — Encounter: Payer: Self-pay | Admitting: Adult Health

## 2018-06-29 DIAGNOSIS — J4520 Mild intermittent asthma, uncomplicated: Secondary | ICD-10-CM

## 2018-06-29 DIAGNOSIS — I10 Essential (primary) hypertension: Secondary | ICD-10-CM

## 2018-06-29 DIAGNOSIS — K279 Peptic ulcer, site unspecified, unspecified as acute or chronic, without hemorrhage or perforation: Secondary | ICD-10-CM | POA: Diagnosis not present

## 2018-06-29 DIAGNOSIS — E0821 Diabetes mellitus due to underlying condition with diabetic nephropathy: Secondary | ICD-10-CM | POA: Diagnosis not present

## 2018-06-29 DIAGNOSIS — C9200 Acute myeloblastic leukemia, not having achieved remission: Secondary | ICD-10-CM

## 2018-06-29 NOTE — Progress Notes (Signed)
Location:  [facility] Number: [id number] Place of Service:  SNF (31) Provider:  Durenda Age, NP  Patient Care Team: [name], MD as PCP - General (Family Medicine) Bary Castilla, Forest Gleason, MD as Consulting Physician (General Surgery) Agapito Games as Consulting Physician (Optometry) Gae Dry, MD as Referring Physician (Obstetrics and Gynecology) Lequita Asal, MD as Referring Physician (Hematology and Oncology) Murlean Iba, MD (Internal Medicine) Hessie Knows, MD as Consulting Physician (Orthopedic Surgery)  Extended Emergency Contact Information Primary Emergency Contact: Rohner,Paul Address: [address]          [place], [place] [address] Johnnette Litter of [place] Phone: [phone] Mobile Phone: [phone] Relation: Spouse Secondary Emergency Contact: [place] of [place] Phone: [phone] Relation: Son  Code Status:  DNR  Goals of care: Advanced Directive information Advanced Directives 06/29/2018  Does Patient Have a Medical Advance Directive? Yes  Type of Advance Directive Out of facility DNR (pink MOST or yellow form)  Does patient want to make changes to medical advance directive? No - Patient declined  Copy of [place] in Chart? -  Would patient like information on creating a medical advance directive? -  Some encounter information is confidential and restricted. Go to Review Flowsheets activity to see all data.     Chief Complaint  Patient presents with  . Discharge Note    Patient seen for transfer to Hospice 06/29/18    HPI:  Julie Holland is a 77 y.o. female seen today for transfer to Hospice.    She has been admitted to [facility] on 06/08/18 after a recent hospitalization. She presented to OSH on 9/16 with anemia and massive swelling of left upper extremity and left lower extremity found to have left gluteal hematoma.  She has since  become transfusion dependent and transferred to [facility] for further care.  She was transfused 2 units packed RBC.  Lower extremity CT scan indicated a decrease in the size of the gluteal hematoma.  Hematology was consulted and recommended starting folic acid and tranexamic acid.  PICC line was placed.  She was given transfusions as needed.  She received 2 units on 9/19, 1 unit on 9/20, 1 unit on 9/21, and 2 units on 9/22, 1 unit on 9/25 and 1 unit of platelets each on 9/22 and 9/25.  Platelets were maintained greater than 50 and this goal was gradually this reduce to hematomas.   She was found to have a high circulating blasts (~80%).  Dr. Prince Solian thought that further chemotherapy is unlikely to result in remission.  It was discussed with patient and husband separately.  She has a PMH of carcinoma in situ of the perineum, CKD stage I, DM2, GERD, glaucoma, HTN, and HLD.     Past Medical History:  Diagnosis Date  . Arthritis   . Asthma   . Blood transfusion without reported diagnosis   . Cancer ([place]) 03/2015   In situ carcinoma of the perianal skin, incidental finding at hemorrhoidectomy.  . Cataract   . Chronic kidney disease    stage 1  . Diabetes mellitus 2007   type II  . Dry eye of right side   . GERD (gastroesophageal reflux disease)    OCC  . Glaucoma 2018   RIGHT EYE   . Hemorrhoids   . Hyperlipidemia   . Hypertension    Past Surgical History:  Procedure Laterality Date  . CATARACT EXTRACTION, BILATERAL    .  CHOLECYSTECTOMY    . COLONOSCOPY  02/13/13   Dr Bary Castilla  . EPIGASTRIC HERNIA REPAIR N/A 03/20/2015   Procedure: HERNIA REPAIR EPIGASTRIC ADULT;  Surgeon: Robert Bellow, MD;  Location: [facility] ORS;  Service: General;  Laterality: N/A;  . ESOPHAGOGASTRODUODENOSCOPY N/A 02/19/2018   Procedure: ESOPHAGOGASTRODUODENOSCOPY (EGD);  Surgeon: Lin Landsman, MD;  Location: [facility] ENDOSCOPY;  Service: Gastroenterology;  Laterality: N/A;  . HEMORRHOID SURGERY N/A 03/20/2015    FOCAL  HIGH-GRADE SQUAMOUS INTRAEPITHELIAL LESION (HSIL, ANAL /HEMORRHOIDECTOMY;   Robert Bellow, MD [facility] ORS;  : General;  Laterality: N/A;  . HERNIA REPAIR  July 2016   Epigastric hernia, primary repair  . INCISION AND DRAINAGE Right 02/11/2018   Procedure: INCISION AND DRAINAGE- RIGHT HAND;  Surgeon: Hessie Knows, MD;  Location: [facility] ORS;  Service: Orthopedics;  Laterality: Right;  . TONSILLECTOMY  age 76  . TOTAL ABDOMINAL HYSTERECTOMY  01/1989  . TUBAL LIGATION    . 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  Case has been presented at the [facility] tumor board. No indication for additional treatment outside of serial exams  Surgeon: Robert Bellow, MD;  Location: [facility] ORS;  Service: General;  Laterality: N/A;    Allergies  Allergen Reactions  . Hydrocodone Nausea And Vomiting and Other (See Comments)    Vertigo but patient takes hydrocodone/acetaminophen outpatient  . Oxycodone Other (See Comments)    Unknown reaction    Outpatient Encounter Medications as of 06/29/2018  Medication Sig  . acetaminophen (TYLENOL) 325 MG tablet Take 650 mg by mouth every 6 (six) hours as needed for mild pain, moderate pain or fever.  Marland Kitchen ADVAIR DISKUS 100-50 MCG/DOSE AEPB USE 1 PUFF TWO TIMES DAILY  . amLODipine (NORVASC) 5 MG tablet Take 5 mg by mouth daily.  . carvedilol (COREG) 25 MG tablet Take 25 mg by mouth 2 (two) times daily.  . carvedilol (COREG) 6.25 MG tablet Take 1 tablet (6.25 mg total) by mouth 2 (two) times daily with a meal.  . folic acid (FOLVITE) 1 MG tablet Take 1 mg by mouth daily.  . heparin flush 10 UNIT/ML SOLN injection 5 Units as needed.  . hydrOXYzine (ATARAX/VISTARIL) 25 MG tablet Take 25 mg by mouth every 6 (six) hours as needed for anxiety or itching.  . latanoprost (XALATAN) 0.005 % ophthalmic solution Place 1 drop at bedtime into the right eye.  Marland Kitchen levofloxacin (LEVAQUIN) 500 MG tablet Take  500 mg by mouth daily.  Marland Kitchen lidocaine (LIDODERM) 5 % Place 1 patch onto the skin daily. Remove & Discard patch within 12 hours or as directed by MD  . lisinopril (PRINIVIL,ZESTRIL) 20 MG tablet Take 20 mg by mouth daily.  Marland Kitchen loperamide (IMODIUM A-D) 2 MG tablet Take 2 mg by mouth as needed for diarrhea or loose stools. Up to 8 tabs/24 hours  . metFORMIN (GLUCOPHAGE) 850 MG tablet TAKE 1 TABLET BY MOUTH TWO  TIMES DAILY WITH A MEAL  . Multiple Vitamin (TAB-A-VITE) TABS Take 1 tablet by mouth daily.  Marland Kitchen oxyCODONE-acetaminophen (PERCOCET/ROXICET) 5-325 MG tablet Take 1 tablet by mouth every 6 (six) hours as needed for severe pain.  . pantoprazole (PROTONIX) 40 MG tablet Take 1 tablet (40 mg total) by mouth 2 (two) times daily before a meal.  . polyethylene glycol (MIRALAX / GLYCOLAX) packet Take 17 g by mouth daily.  . simvastatin (ZOCOR) 20 MG tablet Take 1 tablet (20 mg  total) by mouth at bedtime.  . sodium chloride 0.9 % injection Inject 10 mLs into the vein as needed.  . tranexamic acid (LYSTEDA) 650 MG TABS tablet Take 1,300 mg by mouth 2 (two) times daily.  . valACYclovir (VALTREX) 500 MG tablet Take 500 mg by mouth daily.  Marland Kitchen ACCU-CHEK SMARTVIEW test strip USE AS INSTRUCTED TO CHECK  BLOOD GLUCOSE ONCE DAILY if desired; LON 99 months; Dx E11.9 (Patient not taking: Reported on 06/29/2018)  . [DISCONTINUED] lisinopril (PRINIVIL,ZESTRIL) 10 MG tablet TAKE 1 TABLET BY MOUTH  DAILY   Facility-Administered Encounter Medications as of 06/29/2018  Medication  . sodium chloride flush (NS) 0.9 % injection 10 mL  . sodium chloride flush (NS) 0.9 % injection 3 mL    Review of Systems  GENERAL: No change fever, or chills  MOUTH and THROAT: Denies oral discomfort, gingival pain or bleeding, pain from teeth or hoarseness   RESPIRATORY: no cough, SOB, DOE, wheezing, hemoptysis CARDIAC: No chest pain, edema or palpitations GI: No abdominal pain, diarrhea, constipation, heart burn, nausea or vomiting GU:  Denies dysuria, frequency, hematuria, incontinence, or discharge PSYCHIATRIC: Denies feelings of depression or anxiety. No report of hallucinations, insomnia, paranoia, or agitation   Immunization History  Administered Date(s) Administered  . Influenza Split 07/29/2011, 07/13/2012  . Influenza, High Dose Seasonal PF 07/20/2013, 06/27/2015, 05/27/2016, 06/11/2017  . Pneumococcal Conjugate-13 12/10/2017  . Pneumococcal Polysaccharide-23 07/21/2011  . Tdap 10/16/2010, 03/08/2017   Pertinent  Health Maintenance Due  Topic Date Due  . INFLUENZA VACCINE  04/08/2018  . HEMOGLOBIN A1C  08/21/2018  . OPHTHALMOLOGY EXAM  12/12/2018  . FOOT EXAM  04/07/2019  . DEXA SCAN  Completed  . PNA vac Low Risk Adult  Completed   Fall Risk  05/17/2018 04/06/2018 03/08/2018 02/26/2018 02/04/2018  Falls in the past year? Yes Yes Yes Yes No  Number falls in past yr: 1 1 1 1  -  Comment - - - - -  Injury with Fall? No No Yes Yes -  Risk for fall due to : Impaired balance/gait - - - Impaired vision;History of fall(s);Impaired balance/gait  Risk for fall due to: Comment - - - - wears reading glasses; glaucoma; ambulates with cane  Follow up Falls prevention discussed - Falls prevention discussed Falls prevention discussed -      Vitals:   06/29/18 1603  BP: 137/69  Pulse: 79  Resp: 17  Temp: 97.7 F (36.5 C)  TempSrc: Oral  SpO2: 97%  Weight: 105 lb 3.2 oz (47.7 kg)  Height: 5\' 5"  (1.651 m)   Body mass index is 17.51 kg/m.  Physical Exam  GENERAL APPEARANCE:  In no acute distress.  SKIN:  Hematoma MOUTH and THROAT: Lips are without lesions. Oral mucosa is moist and without lesions. Tongue is normal in shape, size, and color and without lesions RESPIRATORY: Breathing is even & unlabored, BS CTAB CARDIAC: RRR, no murmur,no extra heart sounds, no edema, Right upper arm double lumen PICC GI: Abdomen soft, normal BS, no masses, no tenderness EXTREMITIES:  Able to move X 4 extremities NEUROLOGICAL:  There is no tremor. Speech is clear PSYCHIATRIC: Alert and oriented X 3. Affect and behavior are appropriate   Labs reviewed: Recent Labs    05/14/18 0818  05/24/18 0806 05/25/18 0459  06/23/18 0806 06/25/18 0929 06/25/18 1225  NA 130*   < > 130* 128*   < > 131* 128* 129*  K 3.7   < > 3.8 3.2*   < > 4.0  3.7 3.8  CL 94*   < > 94* 93*   < > 94* 92* 95*  CO2 26   < > 28 29   < > 26 26 26   GLUCOSE 175*   < > 197* 149*   < > 228* 223* 145*  BUN 17   < > 20 19   < > 17 23 24*  CREATININE 0.59   < > 0.75 0.55   < > 0.57 0.59 0.52  CALCIUM 8.5*   < > 8.2* 7.6*   < > 8.9 8.8* 8.5*  MG 1.4*  --  1.4* 1.4*  --   --   --   --    < > = values in this interval not displayed.   Recent Labs    06/11/18 1747 06/14/18 1625 06/25/18 1225  AST 24 37 32  ALT 10 14 17   ALKPHOS 71 81 95  BILITOT 2.6* 3.2* 2.7*  PROT 6.8 7.8 7.9  ALBUMIN 2.5* 2.8* 3.1*   Recent Labs    06/23/18 0806 06/25/18 0956 06/25/18 1225 06/25/18 1509  WBC 42.5* 46.6* 46.6*  --   NEUTROABS 0.9* 2.3 1.4*  --   HGB 9.4* 9.3* 9.2*  --   HCT 31.2* 30.7* 29.9*  --   MCV 96.0 95.0 96.1  --   PLT 19* 19* 16* 34*   Lab Results  Component Value Date   TSH 2.767 10/31/2016   Lab Results  Component Value Date   HGBA1C 6.7 (H) 02/19/2018   Lab Results  Component Value Date   CHOL 113 10/05/2017   HDL 50 (L) 10/05/2017   LDLCALC 49 10/05/2017   LDLDIRECT 67.5 07/13/2012   TRIG 60 10/05/2017   CHOLHDL 2.3 10/05/2017    Assessment/Plan  1. Acute myeloid leukemia not having achieved remission ([place]) -  Will continue valacyclovir 500 mg 1 tab daily andLevofloxacin 500 mg 1 tab daily for prophylaxis, will transfer to [facility] for comfort care   2. Essential hypertension -  continue Coreg 25 mg 1 tab twice a day, amlodipine 5 mg 1 tab daily and lisinopril 20 mg 1 tab daily   3. PUD (peptic ulcer disease) -continue pantoprazole 40 mg 1 tab twice a day   4. Mild intermittent asthma without complication -no  wheezing or S OB, continue Advair Diskus 100-51 1 puff twice a day   5. Diabetes mellitus due to underlying condition, controlled, with diabetic nephropathy, without long-term current use of insulin ([place]) -continue Metformin 850 mg 1 tab twice a day Lab Results  Component Value Date   HGBA1C 6.7 (H) 02/19/2018      I have filled out patient's discharge paperwork.  DME provided: None  Total discharge time: Less than 30 minutes  Discharge time involved coordination of the discharge process with Education officer, museum and nursing staff.    Durenda Age, NP [facility] [phone] (Monday-Friday 8:00 a.m. - 5:00 p.m.) [phone] (after hours)

## 2018-06-30 ENCOUNTER — Inpatient Hospital Stay: Payer: Medicare Other

## 2018-07-02 ENCOUNTER — Inpatient Hospital Stay: Payer: Medicare Other

## 2018-07-05 ENCOUNTER — Telehealth: Payer: Self-pay

## 2018-07-05 NOTE — Telephone Encounter (Signed)
Copied from [phone]. Topic: General - Other >> Jul 05, 2018  9:45 AM Ivar Drape wrote: Reason for CRM: Patient's spouse, [name], wanted the provider to know that the patient is in Hospice on [address].

## 2018-07-06 NOTE — Telephone Encounter (Signed)
I personally spoke with her husband  She is actively dying, at Hospice  We can reach him at his # [phone] He will have her # disconnected since she can no longer use it Offered support --------- Please update phone contact info

## 2018-07-09 DEATH — deceased

## 2018-07-12 ENCOUNTER — Ambulatory Visit: Payer: Medicare Other | Admitting: Gastroenterology

## 2018-07-26 ENCOUNTER — Other Ambulatory Visit: Payer: Self-pay

## 2018-07-26 NOTE — Patient Outreach (Signed)
[facility]) Care Management  07/26/2018  Julie Holland [birth year] [id number]   Medication Adherence call to Julie Holland patient Telephone number is disconnected patient is due on Lisinopril 10 mg and Metformin 850 mg under [place].   [place] Management Direct Dial [phone]  Fax [phone] [email or web address]

## 2018-07-29 ENCOUNTER — Other Ambulatory Visit: Payer: Medicare Other

## 2018-07-29 ENCOUNTER — Ambulatory Visit: Payer: Medicare Other | Admitting: Hematology and Oncology

## 2018-09-06 ENCOUNTER — Ambulatory Visit: Payer: Medicare Other | Admitting: Family Medicine

## 2018-09-10 ENCOUNTER — Encounter: Payer: Self-pay | Admitting: Hematology and Oncology

## 2018-09-10 DIAGNOSIS — Z7189 Other specified counseling: Secondary | ICD-10-CM | POA: Insufficient documentation

## 2018-09-14 ENCOUNTER — Telehealth: Payer: Self-pay | Admitting: Obstetrics & Gynecology

## 2018-09-14 NOTE — Telephone Encounter (Signed)
Patient husband [name] is calling to let Dr. Kenton Kingfisher know that his wife past in October of last year.
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
